# Patient Record
Sex: Male | Born: 1945
Health system: Southern US, Community
[De-identification: ages and names within clinical notes are randomized; demographics above are authoritative.]

## PROBLEM LIST (undated history)

## (undated) DIAGNOSIS — I10 Essential (primary) hypertension: Secondary | ICD-10-CM

## (undated) DIAGNOSIS — I739 Peripheral vascular disease, unspecified: Secondary | ICD-10-CM

## (undated) DIAGNOSIS — E7800 Pure hypercholesterolemia, unspecified: Secondary | ICD-10-CM

## (undated) HISTORY — DX: Peripheral vascular disease, unspecified: I73.9

## (undated) HISTORY — PX: APPENDECTOMY: SHX54

---

## 2011-05-17 ENCOUNTER — Emergency Department (INDEPENDENT_AMBULATORY_CARE_PROVIDER_SITE_OTHER)
Admission: EM | Admit: 2011-05-17 | Discharge: 2011-05-17 | Disposition: A | Discharge status: Home / Self Care | Payer: Self-pay | Source: Home / Self Care | Attending: Family Medicine | Admitting: Family Medicine

## 2011-05-17 ENCOUNTER — Emergency Department (INDEPENDENT_AMBULATORY_CARE_PROVIDER_SITE_OTHER): Payer: Self-pay

## 2011-05-17 ENCOUNTER — Encounter: Payer: Self-pay | Admitting: Emergency Medicine

## 2011-05-17 DIAGNOSIS — E1169 Type 2 diabetes mellitus with other specified complication: Secondary | ICD-10-CM

## 2011-05-17 DIAGNOSIS — I10 Essential (primary) hypertension: Secondary | ICD-10-CM

## 2011-05-17 HISTORY — DX: Pure hypercholesterolemia, unspecified: E78.00

## 2011-05-17 HISTORY — DX: Essential (primary) hypertension: I10

## 2011-05-17 LAB — POCT I-STAT, CHEM 8
Calcium, Ion: 1.35 mmol/L — ABNORMAL HIGH (ref 1.12–1.32)
Glucose, Bld: 223 mg/dL — ABNORMAL HIGH (ref 70–99)
HCT: 51 % (ref 39.0–52.0)
TCO2: 28 mmol/L (ref 0–100)

## 2011-05-17 MED ORDER — LISINOPRIL-HYDROCHLOROTHIAZIDE 10-12.5 MG PO TABS: 1.0000 | ORAL_TABLET | Freq: Every day | ORAL | Status: DC

## 2011-05-17 NOTE — ED Notes (Signed)
Patient transported to X-ray.  Patient not in treatment room for recheck of vitals

## 2011-05-17 NOTE — ED Provider Notes (Signed)
History     CSN: [id number]  Arrival date & time 05/17/11  1206   First MD Initiated Contact with Patient 05/17/11 1215      Chief Complaint  Patient presents with  . URI    (Consider location/radiation/quality/duration/timing/severity/associated sxs/prior treatment) Patient is a 65 y.o. male presenting with URI. The history is provided by the patient.  URI The primary symptoms include fatigue. Primary symptoms do not include ear pain or sore throat. The current episode started 6 to 7 days ago. This is a new problem. The problem has not changed since onset. Symptoms associated with the illness include facial pain, sinus pressure and congestion.    Past Medical History  Diagnosis Date  . Diabetes mellitus   . Hypertension   . High cholesterol     Past Surgical History  Procedure Date  . Appendectomy     Family History  Problem Relation Age of Onset  . Heart failure Mother   . Cancer Father   . Diabetes Sister   . Cancer Sister     History  Substance Use Topics  . Smoking status: Never Smoker   . Smokeless tobacco: Not on file  . Alcohol Use: No      Review of Systems  Constitutional: Positive for fatigue.  HENT: Positive for congestion and sinus pressure. Negative for ear pain, nosebleeds, sore throat, facial swelling and tinnitus.   Respiratory: Negative.     Allergies  Review of patient's allergies indicates no known allergies.  Home Medications   Current Outpatient Rx  Name Route Sig Dispense Refill  . METFORMIN HCL 1000 MG PO TABS Oral Take 1,000 mg by mouth 2 (two) times daily with a meal.      . OVER THE COUNTER MEDICATION  novolin insulin     . LISINOPRIL-HYDROCHLOROTHIAZIDE 10-12.5 MG PO TABS Oral Take 1 tablet by mouth daily. 30 tablet 1    BP 168/71  Pulse 70  Temp(Src) 97.5 F (36.4 C) (Oral)  Resp 18  SpO2 100%  Physical Exam  Constitutional: He is oriented to person, place, and time. He appears well-developed and well-nourished.   HENT:  Head: Normocephalic.  Right Ear: External ear normal.  Left Ear: External ear normal.  Mouth/Throat: Oropharynx is clear and moist.  Eyes: Conjunctivae and EOM are normal. Pupils are equal, round, and reactive to light.  Neck: Normal range of motion. Neck supple.  Cardiovascular: Normal rate, normal heart sounds and intact distal pulses.   Pulmonary/Chest: Effort normal and breath sounds normal.  Lymphadenopathy:    He has no cervical adenopathy.  Neurological: He is alert and oriented to person, place, and time.  Skin: Skin is warm and dry.    ED Course  Procedures (including critical care time)  Labs Reviewed  POCT I-STAT, CHEM 8 - Abnormal; Notable for the following:    Glucose, Bld 223 (*)    Calcium, Ion 1.35 (*)    Hemoglobin 17.3 (*)    All other components within normal limits  I-STAT, CHEM 8   Dg Sinuses Complete  05/17/2011  *RADIOLOGY REPORT*  Clinical Data: Facial pressure for 1 week, worse on the left.  PARANASAL SINUSES - COMPLETE 3 + VIEW  Comparison: None.  Findings: The paranasal sinuses appear clear.  No fracture is identified.  IMPRESSION: Negative exam.  Original Report Authenticated By: Bernadene Bell. D'ALESSIO, M.D.     1. Hypertension associated with diabetes       MDM  X-rays reviewed and report per radiologist.  i-stat reviewed       Barkley Bruns, MD 05/17/11 1328

## 2011-05-17 NOTE — ED Notes (Signed)
C/o head congestion and pressure.  Sinus congestion.  Onset one week ago.  Lack of energy

## 2011-06-03 ENCOUNTER — Other Ambulatory Visit: Payer: Self-pay | Admitting: Family Medicine

## 2011-06-03 DIAGNOSIS — R52 Pain, unspecified: Secondary | ICD-10-CM

## 2014-07-18 DIAGNOSIS — I152 Hypertension secondary to endocrine disorders: Secondary | ICD-10-CM

## 2014-07-18 DIAGNOSIS — E11319 Type 2 diabetes mellitus with unspecified diabetic retinopathy without macular edema: Secondary | ICD-10-CM | POA: Insufficient documentation

## 2014-07-18 DIAGNOSIS — I10 Essential (primary) hypertension: Secondary | ICD-10-CM

## 2014-07-18 DIAGNOSIS — E1159 Type 2 diabetes mellitus with other circulatory complications: Secondary | ICD-10-CM

## 2014-07-18 DIAGNOSIS — E782 Mixed hyperlipidemia: Secondary | ICD-10-CM | POA: Insufficient documentation

## 2015-01-26 ENCOUNTER — Encounter: Payer: Self-pay | Admitting: Endocrinology

## 2018-01-18 ENCOUNTER — Encounter (INDEPENDENT_AMBULATORY_CARE_PROVIDER_SITE_OTHER): Payer: Medicare HMO | Admitting: Ophthalmology

## 2018-01-18 DIAGNOSIS — E113592 Type 2 diabetes mellitus with proliferative diabetic retinopathy without macular edema, left eye: Secondary | ICD-10-CM

## 2018-01-18 DIAGNOSIS — E11311 Type 2 diabetes mellitus with unspecified diabetic retinopathy with macular edema: Secondary | ICD-10-CM | POA: Diagnosis not present

## 2018-01-18 DIAGNOSIS — E113511 Type 2 diabetes mellitus with proliferative diabetic retinopathy with macular edema, right eye: Secondary | ICD-10-CM

## 2018-01-18 DIAGNOSIS — H4311 Vitreous hemorrhage, right eye: Secondary | ICD-10-CM

## 2018-01-18 DIAGNOSIS — I10 Essential (primary) hypertension: Secondary | ICD-10-CM | POA: Diagnosis not present

## 2018-01-18 DIAGNOSIS — H211X3 Other vascular disorders of iris and ciliary body, bilateral: Secondary | ICD-10-CM

## 2018-01-18 DIAGNOSIS — H2513 Age-related nuclear cataract, bilateral: Secondary | ICD-10-CM

## 2018-01-18 DIAGNOSIS — H43813 Vitreous degeneration, bilateral: Secondary | ICD-10-CM

## 2018-01-18 DIAGNOSIS — H35033 Hypertensive retinopathy, bilateral: Secondary | ICD-10-CM

## 2018-01-26 ENCOUNTER — Encounter (INDEPENDENT_AMBULATORY_CARE_PROVIDER_SITE_OTHER): Payer: Medicare HMO | Admitting: Ophthalmology

## 2018-01-26 DIAGNOSIS — E11311 Type 2 diabetes mellitus with unspecified diabetic retinopathy with macular edema: Secondary | ICD-10-CM

## 2018-01-26 DIAGNOSIS — E113512 Type 2 diabetes mellitus with proliferative diabetic retinopathy with macular edema, left eye: Secondary | ICD-10-CM | POA: Diagnosis not present

## 2018-02-15 ENCOUNTER — Encounter (INDEPENDENT_AMBULATORY_CARE_PROVIDER_SITE_OTHER): Payer: Medicare HMO | Admitting: Ophthalmology

## 2018-02-15 DIAGNOSIS — H35033 Hypertensive retinopathy, bilateral: Secondary | ICD-10-CM | POA: Diagnosis not present

## 2018-02-15 DIAGNOSIS — E113513 Type 2 diabetes mellitus with proliferative diabetic retinopathy with macular edema, bilateral: Secondary | ICD-10-CM | POA: Diagnosis not present

## 2018-02-15 DIAGNOSIS — H43813 Vitreous degeneration, bilateral: Secondary | ICD-10-CM

## 2018-02-15 DIAGNOSIS — H4311 Vitreous hemorrhage, right eye: Secondary | ICD-10-CM

## 2018-02-15 DIAGNOSIS — I10 Essential (primary) hypertension: Secondary | ICD-10-CM

## 2018-02-15 DIAGNOSIS — E11311 Type 2 diabetes mellitus with unspecified diabetic retinopathy with macular edema: Secondary | ICD-10-CM | POA: Diagnosis not present

## 2018-02-15 DIAGNOSIS — H2513 Age-related nuclear cataract, bilateral: Secondary | ICD-10-CM

## 2018-03-13 ENCOUNTER — Observation Stay (HOSPITAL_COMMUNITY): Payer: Medicare HMO

## 2018-03-13 ENCOUNTER — Other Ambulatory Visit: Payer: Self-pay

## 2018-03-13 ENCOUNTER — Encounter (HOSPITAL_COMMUNITY): Payer: Self-pay | Admitting: Emergency Medicine

## 2018-03-13 ENCOUNTER — Inpatient Hospital Stay (HOSPITAL_COMMUNITY)
Admission: EM | Admit: 2018-03-13 | Discharge: 2018-03-20 | DRG: 196 | Disposition: A | Discharge status: Home / Self Care | Payer: Medicare HMO | Attending: Family Medicine | Admitting: Family Medicine

## 2018-03-13 ENCOUNTER — Emergency Department (HOSPITAL_COMMUNITY): Payer: Medicare HMO

## 2018-03-13 ENCOUNTER — Observation Stay (HOSPITAL_BASED_OUTPATIENT_CLINIC_OR_DEPARTMENT_OTHER): Payer: Medicare HMO

## 2018-03-13 DIAGNOSIS — R17 Unspecified jaundice: Secondary | ICD-10-CM

## 2018-03-13 DIAGNOSIS — Z9049 Acquired absence of other specified parts of digestive tract: Secondary | ICD-10-CM

## 2018-03-13 DIAGNOSIS — I450 Right fascicular block: Secondary | ICD-10-CM | POA: Diagnosis present

## 2018-03-13 DIAGNOSIS — E1165 Type 2 diabetes mellitus with hyperglycemia: Secondary | ICD-10-CM | POA: Diagnosis present

## 2018-03-13 DIAGNOSIS — Z79899 Other long term (current) drug therapy: Secondary | ICD-10-CM

## 2018-03-13 DIAGNOSIS — E1122 Type 2 diabetes mellitus with diabetic chronic kidney disease: Secondary | ICD-10-CM | POA: Diagnosis present

## 2018-03-13 DIAGNOSIS — J929 Pleural plaque without asbestos: Secondary | ICD-10-CM

## 2018-03-13 DIAGNOSIS — Z794 Long term (current) use of insulin: Secondary | ICD-10-CM

## 2018-03-13 DIAGNOSIS — J189 Pneumonia, unspecified organism: Secondary | ICD-10-CM | POA: Diagnosis not present

## 2018-03-13 DIAGNOSIS — R7989 Other specified abnormal findings of blood chemistry: Secondary | ICD-10-CM | POA: Diagnosis present

## 2018-03-13 DIAGNOSIS — J61 Pneumoconiosis due to asbestos and other mineral fibers: Principal | ICD-10-CM | POA: Diagnosis present

## 2018-03-13 DIAGNOSIS — I5030 Unspecified diastolic (congestive) heart failure: Secondary | ICD-10-CM

## 2018-03-13 DIAGNOSIS — G4730 Sleep apnea, unspecified: Secondary | ICD-10-CM | POA: Diagnosis present

## 2018-03-13 DIAGNOSIS — R9389 Abnormal findings on diagnostic imaging of other specified body structures: Secondary | ICD-10-CM

## 2018-03-13 DIAGNOSIS — E785 Hyperlipidemia, unspecified: Secondary | ICD-10-CM | POA: Diagnosis present

## 2018-03-13 DIAGNOSIS — E871 Hypo-osmolality and hyponatremia: Secondary | ICD-10-CM

## 2018-03-13 DIAGNOSIS — Z8249 Family history of ischemic heart disease and other diseases of the circulatory system: Secondary | ICD-10-CM

## 2018-03-13 DIAGNOSIS — I509 Heart failure, unspecified: Secondary | ICD-10-CM | POA: Diagnosis not present

## 2018-03-13 DIAGNOSIS — Z79890 Hormone replacement therapy (postmenopausal): Secondary | ICD-10-CM

## 2018-03-13 DIAGNOSIS — Z833 Family history of diabetes mellitus: Secondary | ICD-10-CM

## 2018-03-13 DIAGNOSIS — N182 Chronic kidney disease, stage 2 (mild): Secondary | ICD-10-CM | POA: Diagnosis present

## 2018-03-13 DIAGNOSIS — I10 Essential (primary) hypertension: Secondary | ICD-10-CM

## 2018-03-13 DIAGNOSIS — R220 Localized swelling, mass and lump, head: Secondary | ICD-10-CM

## 2018-03-13 DIAGNOSIS — R011 Cardiac murmur, unspecified: Secondary | ICD-10-CM | POA: Diagnosis present

## 2018-03-13 DIAGNOSIS — Z87891 Personal history of nicotine dependence: Secondary | ICD-10-CM

## 2018-03-13 DIAGNOSIS — Z6825 Body mass index (BMI) 25.0-25.9, adult: Secondary | ICD-10-CM

## 2018-03-13 DIAGNOSIS — I248 Other forms of acute ischemic heart disease: Secondary | ICD-10-CM | POA: Diagnosis present

## 2018-03-13 DIAGNOSIS — I872 Venous insufficiency (chronic) (peripheral): Secondary | ICD-10-CM | POA: Diagnosis present

## 2018-03-13 DIAGNOSIS — I129 Hypertensive chronic kidney disease with stage 1 through stage 4 chronic kidney disease, or unspecified chronic kidney disease: Secondary | ICD-10-CM | POA: Diagnosis present

## 2018-03-13 DIAGNOSIS — Z77090 Contact with and (suspected) exposure to asbestos: Secondary | ICD-10-CM

## 2018-03-13 DIAGNOSIS — R112 Nausea with vomiting, unspecified: Secondary | ICD-10-CM | POA: Diagnosis not present

## 2018-03-13 DIAGNOSIS — R400 Somnolence: Secondary | ICD-10-CM

## 2018-03-13 DIAGNOSIS — R0602 Shortness of breath: Secondary | ICD-10-CM | POA: Diagnosis not present

## 2018-03-13 DIAGNOSIS — G7000 Myasthenia gravis without (acute) exacerbation: Secondary | ICD-10-CM | POA: Diagnosis present

## 2018-03-13 DIAGNOSIS — J9622 Acute and chronic respiratory failure with hypercapnia: Secondary | ICD-10-CM | POA: Diagnosis present

## 2018-03-13 DIAGNOSIS — I452 Bifascicular block: Secondary | ICD-10-CM | POA: Diagnosis present

## 2018-03-13 DIAGNOSIS — E663 Overweight: Secondary | ICD-10-CM | POA: Diagnosis present

## 2018-03-13 DIAGNOSIS — J9611 Chronic respiratory failure with hypoxia: Secondary | ICD-10-CM

## 2018-03-13 DIAGNOSIS — J9612 Chronic respiratory failure with hypercapnia: Secondary | ICD-10-CM

## 2018-03-13 DIAGNOSIS — Z23 Encounter for immunization: Secondary | ICD-10-CM

## 2018-03-13 DIAGNOSIS — Z7982 Long term (current) use of aspirin: Secondary | ICD-10-CM

## 2018-03-13 DIAGNOSIS — I2781 Cor pulmonale (chronic): Secondary | ICD-10-CM | POA: Diagnosis present

## 2018-03-13 DIAGNOSIS — L989 Disorder of the skin and subcutaneous tissue, unspecified: Secondary | ICD-10-CM | POA: Diagnosis present

## 2018-03-13 DIAGNOSIS — I2510 Atherosclerotic heart disease of native coronary artery without angina pectoris: Secondary | ICD-10-CM | POA: Diagnosis present

## 2018-03-13 DIAGNOSIS — I152 Hypertension secondary to endocrine disorders: Secondary | ICD-10-CM

## 2018-03-13 DIAGNOSIS — E1159 Type 2 diabetes mellitus with other circulatory complications: Secondary | ICD-10-CM

## 2018-03-13 DIAGNOSIS — E119 Type 2 diabetes mellitus without complications: Secondary | ICD-10-CM

## 2018-03-13 DIAGNOSIS — I2729 Other secondary pulmonary hypertension: Secondary | ICD-10-CM | POA: Diagnosis present

## 2018-03-13 DIAGNOSIS — I4519 Other right bundle-branch block: Secondary | ICD-10-CM | POA: Diagnosis present

## 2018-03-13 DIAGNOSIS — J9621 Acute and chronic respiratory failure with hypoxia: Secondary | ICD-10-CM | POA: Diagnosis present

## 2018-03-13 LAB — CBC WITH DIFFERENTIAL/PLATELET
ABS IMMATURE GRANULOCYTES: 0.03 10*3/uL (ref 0.00–0.07)
Basophils Absolute: 0 10*3/uL (ref 0.0–0.1)
Basophils Relative: 0 %
EOS PCT: 2 %
Eosinophils Absolute: 0.2 10*3/uL (ref 0.0–0.5)
HCT: 51.3 % (ref 39.0–52.0)
HEMOGLOBIN: 16.9 g/dL (ref 13.0–17.0)
Immature Granulocytes: 0 %
LYMPHS ABS: 1.5 10*3/uL (ref 0.7–4.0)
LYMPHS PCT: 14 %
MCH: 29.6 pg (ref 26.0–34.0)
MCHC: 32.9 g/dL (ref 30.0–36.0)
MCV: 89.8 fL (ref 80.0–100.0)
Monocytes Absolute: 0.8 10*3/uL (ref 0.1–1.0)
Monocytes Relative: 8 %
NEUTROS ABS: 7.9 10*3/uL — AB (ref 1.7–7.7)
Neutrophils Relative %: 76 %
Platelets: 200 10*3/uL (ref 150–400)
RBC: 5.71 MIL/uL (ref 4.22–5.81)
RDW: 12.7 % (ref 11.5–15.5)
WBC: 10.3 10*3/uL (ref 4.0–10.5)
nRBC: 0 % (ref 0.0–0.2)

## 2018-03-13 LAB — COMPREHENSIVE METABOLIC PANEL
ALT: 11 U/L (ref 0–44)
ANION GAP: 9 (ref 5–15)
AST: 26 U/L (ref 15–41)
Albumin: 3.3 g/dL — ABNORMAL LOW (ref 3.5–5.0)
Alkaline Phosphatase: 86 U/L (ref 38–126)
BILIRUBIN TOTAL: 1.7 mg/dL — AB (ref 0.3–1.2)
BUN: 11 mg/dL (ref 8–23)
CO2: 32 mmol/L (ref 22–32)
Calcium: 10.2 mg/dL (ref 8.9–10.3)
Chloride: 91 mmol/L — ABNORMAL LOW (ref 98–111)
Creatinine, Ser: 0.89 mg/dL (ref 0.61–1.24)
GFR calc non Af Amer: >60 mL/min (ref >60)
Glucose, Bld: 118 mg/dL — ABNORMAL HIGH (ref 70–99)
POTASSIUM: 4.2 mmol/L (ref 3.5–5.1)
Sodium: 132 mmol/L — ABNORMAL LOW (ref 135–145)
TOTAL PROTEIN: 6 g/dL — AB (ref 6.5–8.1)

## 2018-03-13 LAB — GLUCOSE, CAPILLARY
GLUCOSE-CAPILLARY: 161 mg/dL — AB (ref 70–99)
GLUCOSE-CAPILLARY: 89 mg/dL (ref 70–99)
Glucose-Capillary: 101 mg/dL — ABNORMAL HIGH (ref 70–99)
Glucose-Capillary: 157 mg/dL — ABNORMAL HIGH (ref 70–99)
Glucose-Capillary: 266 mg/dL — ABNORMAL HIGH (ref 70–99)

## 2018-03-13 LAB — BLOOD GAS, ARTERIAL
ACID-BASE EXCESS: 14.6 mmol/L — AB (ref 0.0–2.0)
Acid-Base Excess: 14.4 mmol/L — ABNORMAL HIGH (ref 0.0–2.0)
BICARBONATE: 41.5 mmol/L — AB (ref 20.0–28.0)
BICARBONATE: 40.1 mmol/L — AB (ref 20.0–28.0)
DRAWN BY: 10552
Delivery systems: POSITIVE
Drawn by: 10552
EXPIRATORY PAP: 6
FIO2: 35
Inspiratory PAP: 12
O2 Content: 2 L/min
O2 Saturation: 95.6 %
O2 Saturation: 97.2 %
PATIENT TEMPERATURE: 98.6
PATIENT TEMPERATURE: 98.6
pCO2 arterial: 87.6 mmHg (ref 32.0–48.0)
pCO2 arterial: 67.1 mmHg (ref 32.0–48.0)
pH, Arterial: 7.297 — ABNORMAL LOW (ref 7.350–7.450)
pH, Arterial: 7.394 (ref 7.350–7.450)
pO2, Arterial: 82.5 mmHg — ABNORMAL LOW (ref 83.0–108.0)
pO2, Arterial: 85.9 mmHg (ref 83.0–108.0)

## 2018-03-13 LAB — TROPONIN I
Troponin I: 0.03 ng/mL (ref <0.03)
Troponin I: <0.03 ng/mL (ref <0.03)
Troponin I: 0.03 ng/mL (ref <0.03)

## 2018-03-13 LAB — I-STAT TROPONIN, ED: Troponin i, poc: 0.01 ng/mL (ref 0.00–0.08)

## 2018-03-13 LAB — HEMOGLOBIN A1C
HEMOGLOBIN A1C: 10.2 % — AB (ref 4.8–5.6)
MEAN PLASMA GLUCOSE: 246.04 mg/dL

## 2018-03-13 LAB — ECHOCARDIOGRAM COMPLETE
HEIGHTINCHES: 74 in
Weight: 3283.2 oz

## 2018-03-13 LAB — TSH: TSH: 3.792 u[IU]/mL (ref 0.350–4.500)

## 2018-03-13 LAB — BRAIN NATRIURETIC PEPTIDE: B NATRIURETIC PEPTIDE 5: 40.8 pg/mL (ref 0.0–100.0)

## 2018-03-13 LAB — I-STAT CG4 LACTIC ACID, ED: Lactic Acid, Venous: 0.65 mmol/L (ref 0.5–1.9)

## 2018-03-13 MED ORDER — PREDNISOLONE ACETATE 1 % OP SUSP
1.0000 [drp] | Freq: Four times a day (QID) | OPHTHALMIC | Status: DC
  Administered 2018-03-13 – 2018-03-20 (×28): 1 [drp] via OPHTHALMIC
  Filled 2018-03-13: qty 5

## 2018-03-13 MED ORDER — FUROSEMIDE 10 MG/ML IJ SOLN
40.0000 mg | Freq: Once | INTRAMUSCULAR | Status: AC
  Administered 2018-03-13: 40 mg via INTRAVENOUS
  Filled 2018-03-13: qty 4

## 2018-03-13 MED ORDER — INSULIN GLARGINE 100 UNIT/ML ~~LOC~~ SOLN
20.0000 [IU] | Freq: Every day | SUBCUTANEOUS | Status: DC
  Administered 2018-03-13 – 2018-03-19 (×7): 20 [IU] via SUBCUTANEOUS
  Filled 2018-03-13 (×8): qty 0.2

## 2018-03-13 MED ORDER — METOPROLOL TARTRATE 100 MG PO TABS
100.0000 mg | ORAL_TABLET | Freq: Two times a day (BID) | ORAL | Status: DC
  Administered 2018-03-13 – 2018-03-18 (×10): 100 mg via ORAL
  Filled 2018-03-13 (×10): qty 1

## 2018-03-13 MED ORDER — ATORVASTATIN CALCIUM 40 MG PO TABS
40.0000 mg | ORAL_TABLET | Freq: Every day | ORAL | Status: DC
  Administered 2018-03-13 – 2018-03-19 (×7): 40 mg via ORAL
  Filled 2018-03-13 (×7): qty 1

## 2018-03-13 MED ORDER — ENSURE ENLIVE PO LIQD: 237.0000 mL | Freq: Two times a day (BID) | ORAL | Status: DC

## 2018-03-13 MED ORDER — HYDROCHLOROTHIAZIDE 25 MG PO TABS
25.0000 mg | ORAL_TABLET | Freq: Every day | ORAL | Status: DC
  Administered 2018-03-14 – 2018-03-18 (×5): 25 mg via ORAL
  Filled 2018-03-13 (×5): qty 1

## 2018-03-13 MED ORDER — NITROGLYCERIN 2 % TD OINT
1.0000 [in_us] | TOPICAL_OINTMENT | Freq: Once | TRANSDERMAL | Status: AC
  Administered 2018-03-13: 1 [in_us] via TOPICAL
  Filled 2018-03-13: qty 1

## 2018-03-13 MED ORDER — INFLUENZA VAC SPLIT HIGH-DOSE 0.5 ML IM SUSY
0.5000 mL | PREFILLED_SYRINGE | INTRAMUSCULAR | Status: AC
  Administered 2018-03-14: 0.5 mL via INTRAMUSCULAR
  Filled 2018-03-13: qty 0.5

## 2018-03-13 MED ORDER — INSULIN ASPART 100 UNIT/ML ~~LOC~~ SOLN
0.0000 [IU] | Freq: Every day | SUBCUTANEOUS | Status: DC
  Administered 2018-03-13: 3 [IU] via SUBCUTANEOUS
  Administered 2018-03-14: 2 [IU] via SUBCUTANEOUS
  Administered 2018-03-15: 3 [IU] via SUBCUTANEOUS
  Administered 2018-03-16: 2 [IU] via SUBCUTANEOUS
  Administered 2018-03-17: 3 [IU] via SUBCUTANEOUS

## 2018-03-13 MED ORDER — SODIUM CHLORIDE 0.9 % IV SOLN
500.0000 mg | Freq: Once | INTRAVENOUS | Status: AC
  Administered 2018-03-13: 500 mg via INTRAVENOUS
  Filled 2018-03-13: qty 500

## 2018-03-13 MED ORDER — LISINOPRIL 10 MG PO TABS
20.0000 mg | ORAL_TABLET | Freq: Every day | ORAL | Status: DC
  Administered 2018-03-14 – 2018-03-18 (×5): 20 mg via ORAL
  Filled 2018-03-13 (×5): qty 2

## 2018-03-13 MED ORDER — GLUCERNA SHAKE PO LIQD
237.0000 mL | Freq: Two times a day (BID) | ORAL | Status: DC
  Administered 2018-03-14 – 2018-03-20 (×8): 237 mL via ORAL

## 2018-03-13 MED ORDER — ASPIRIN 81 MG PO CHEW: 324.0000 mg | CHEWABLE_TABLET | Freq: Once | ORAL | Status: DC

## 2018-03-13 MED ORDER — LISINOPRIL-HYDROCHLOROTHIAZIDE 20-25 MG PO TABS: 1.0000 | ORAL_TABLET | Freq: Every day | ORAL | Status: DC

## 2018-03-13 MED ORDER — LEVOTHYROXINE SODIUM 25 MCG PO TABS
25.0000 ug | ORAL_TABLET | Freq: Every day | ORAL | Status: DC
  Administered 2018-03-13 – 2018-03-20 (×8): 25 ug via ORAL
  Filled 2018-03-13 (×8): qty 1

## 2018-03-13 MED ORDER — ORAL CARE MOUTH RINSE
15.0000 mL | Freq: Two times a day (BID) | OROMUCOSAL | Status: DC
  Administered 2018-03-13 – 2018-03-20 (×13): 15 mL via OROMUCOSAL

## 2018-03-13 MED ORDER — INSULIN ASPART 100 UNIT/ML ~~LOC~~ SOLN
0.0000 [IU] | Freq: Three times a day (TID) | SUBCUTANEOUS | Status: DC
  Administered 2018-03-13 – 2018-03-14 (×2): 3 [IU] via SUBCUTANEOUS
  Administered 2018-03-14: 5 [IU] via SUBCUTANEOUS
  Administered 2018-03-15: 2 [IU] via SUBCUTANEOUS
  Administered 2018-03-15: 11 [IU] via SUBCUTANEOUS
  Administered 2018-03-15 – 2018-03-16 (×4): 5 [IU] via SUBCUTANEOUS
  Administered 2018-03-17: 3 [IU] via SUBCUTANEOUS
  Administered 2018-03-18: 5 [IU] via SUBCUTANEOUS
  Administered 2018-03-18: 8 [IU] via SUBCUTANEOUS
  Administered 2018-03-18: 3 [IU] via SUBCUTANEOUS
  Administered 2018-03-19: 15 [IU] via SUBCUTANEOUS
  Administered 2018-03-19 – 2018-03-20 (×2): 2 [IU] via SUBCUTANEOUS
  Administered 2018-03-20: 11 [IU] via SUBCUTANEOUS

## 2018-03-13 MED ORDER — FUROSEMIDE 10 MG/ML IJ SOLN
40.0000 mg | Freq: Two times a day (BID) | INTRAMUSCULAR | Status: DC
  Administered 2018-03-13 (×2): 40 mg via INTRAVENOUS
  Filled 2018-03-13 (×3): qty 4

## 2018-03-13 MED ORDER — METOPROLOL SUCCINATE ER 100 MG PO TB24: 100.0000 mg | ORAL_TABLET | Freq: Every day | ORAL | Status: DC

## 2018-03-13 MED ORDER — CEFTRIAXONE SODIUM 2 G IJ SOLR
2.0000 g | Freq: Once | INTRAMUSCULAR | Status: AC
  Administered 2018-03-13: 2 g via INTRAVENOUS
  Filled 2018-03-13: qty 20

## 2018-03-13 NOTE — Progress Notes (Addendum)
Page to family medicine.   3e20 Robert Ramirez. please enter code status order. lab reports critical troponin of 0.03.  CRITICAL VALUE ALERT  Critical Value:  Troponin 0.03  Date & Time Notied:  Today [phone]  Provider Notified: page to family medicine  Orders Received/Actions taken: will trend troponin

## 2018-03-13 NOTE — Progress Notes (Addendum)
Patient found unresponsive at  0918  Pt seen alert and oriented at 0915, per nursing student and instructor.  Seen at baseline at 0850 by RN.   Page to RR-RN and Family Medicine.   Patient finally wakes to 5 sternal rub, but remains lethargic and confused.  Neuro determines not a code stroke. Family medicine at bedside places ABG order.

## 2018-03-13 NOTE — Progress Notes (Signed)
Patient arrived on the unit from 3E on a hospital bed accompanied by the rapid response nurse and another RN, placed on tele ccmd notified, patient assesment completed, patient on BIPAP, no sign of distress noted, patient oriented to room and staff, bed in lowest position, call bell within reach will monitor.

## 2018-03-13 NOTE — Progress Notes (Signed)
  Echocardiogram 2D Echocardiogram has been performed.  Robert Ramirez 03/13/2018, 2:18 PM

## 2018-03-13 NOTE — Progress Notes (Signed)
Transported to ct and back on BIPAP

## 2018-03-13 NOTE — Progress Notes (Signed)
Call placed to patient's wife. Notified of pt transfer to [facility].

## 2018-03-13 NOTE — Progress Notes (Signed)
Pt placed on Bipap at this time per MD order.  Pt tolerating well.  Pending transfer to 4W.  Will stay at bedside and wait to transfer.

## 2018-03-13 NOTE — ED Provider Notes (Signed)
[facility] EMERGENCY DEPARTMENT Provider Note   CSN: [id number] Arrival date & time: 03/13/18  0037     History   Chief Complaint Chief Complaint  Patient presents with  . Shortness of Breath    HPI Robert Ramirez is a 72 y.o. male.  The history is provided by the patient.  Shortness of Breath   He has history of diabetes, hypertension, hyperlipidemia and comes in because of shortness of breath over the last several days.  He is rather poor and evasive historian, but has noted worsening dyspnea over the last 3 days.  Symptoms are worse with exertion and with laying flat.  He denies chest pain, heaviness, tightness, pressure.  He does relate that he has had some intermittent swelling of his legs over the past year, generally getting better when he keeps his legs elevated.  He has had difficulty with dyspnea in the past, but he relates that to some occupational exposure years ago.  He has not done anything to treat his symptoms at home.  Past Medical History:  Diagnosis Date  . Diabetes mellitus   . High cholesterol   . Hypertension     There are no active problems to display for this patient.   Past Surgical History:  Procedure Laterality Date  . APPENDECTOMY          Home Medications    Prior to Admission medications   Medication Sig Start Date End Date Taking? Authorizing Provider  lisinopril-hydrochlorothiazide (PRINZIDE) 10-12.5 MG per tablet Take 1 tablet by mouth daily. 05/17/11 05/16/12  Linna Hoff, MD  metFORMIN (GLUCOPHAGE) 1000 MG tablet Take 1,000 mg by mouth 2 (two) times daily with a meal.      [provider]  OVER THE COUNTER MEDICATION novolin insulin     [provider]    Family History Family History  Problem Relation Age of Onset  . Heart failure Mother   . Cancer Father   . Diabetes Sister   . Cancer Sister     Social History Social History   Tobacco Use  . Smoking status: Never Smoker    Substance Use Topics  . Alcohol use: No  . Drug use: No     Allergies   Patient has no known allergies.   Review of Systems Review of Systems  Respiratory: Positive for shortness of breath.   All other systems reviewed and are negative.    Physical Exam Updated Vital Signs BP (!) 183/93 (BP Location: Left Arm)   Pulse 93   Temp 98.1 F (36.7 C) (Oral)   Resp (!) 24   Ht 6\' 2"  (1.88 m)   Wt 90.7 kg   SpO2 97%   BMI 25.68 kg/m   Physical Exam  Nursing note and vitals reviewed.  72 year old male, mildly dyspneic at rest, but in no acute distress. Vital signs are significant for elevated blood pressure. Oxygen saturation is 97%, which is normal.  He is not using accessory muscles of respiration, but is unable to complete a sentence without stopping to take a breath. Head is normocephalic and atraumatic. PERRLA, EOMI. Oropharynx is clear. Neck is nontender and supple without adenopathy or JVD. Back is nontender and there is no CVA tenderness.  1+ presacral edema present. Lungs have bibasilar rales about one third of the way up.  There are no wheezes or rhonchi. Chest is nontender. Heart has regular rate and rhythm without murmur. Abdomen is soft, flat, nontender without masses  or hepatosplenomegaly and peristalsis is normoactive. Extremities have 2-3+ pretibial edema, full range of motion is present. Skin is warm and dry without rash. Neurologic: Mental status is normal, cranial nerves are intact, there are no motor or sensory deficits.  ED Treatments / Results  Labs (all labs ordered are listed, but only abnormal results are displayed) Labs Reviewed  COMPREHENSIVE METABOLIC PANEL - Abnormal; Notable for the following components:      Result Value   Sodium 132 (*)    Chloride 91 (*)    Glucose, Bld 118 (*)    Total Protein 6.0 (*)    Albumin 3.3 (*)    Total Bilirubin 1.7 (*)    All other components within normal limits  CBC WITH DIFFERENTIAL/PLATELET -  Abnormal; Notable for the following components:   Neutro Abs 7.9 (*)    All other components within normal limits  CULTURE, BLOOD (ROUTINE X 2)  CULTURE, BLOOD (ROUTINE X 2)  BRAIN NATRIURETIC PEPTIDE  I-STAT TROPONIN, ED  I-STAT CG4 LACTIC ACID, ED    EKG EKG Interpretation  Date/Time:  Saturday March 13 2018 00:45:36 EDT Ventricular Rate:  88 PR Interval:    QRS Duration: 148 QT Interval:  387 QTC Calculation: 469 R Axis:   139 Text Interpretation:  Sinus rhythm Prolonged PR interval RBBB and LPFB No old tracing to compare Confirmed by Dione Booze ([id number]) on 03/13/2018 12:50:21 AM   Radiology Dg Chest 2 View  Result Date: 03/13/2018 CLINICAL DATA:  72 y/o  M; shortness of breath. EXAM: CHEST - 2 VIEW COMPARISON:  None. FINDINGS: Mildly enlarged cardiac silhouette given projection and technique. Calcified pleural plaques. Bilateral pleural thickening versus small pleural effusions. Patchy central and basilar airspace opacities. Bones are unremarkable. IMPRESSION: 1. Patchy central and basilar airspace opacities, possibly chronic lung disease or pneumonia. 2. Calcified pleural plaques. Bilateral pleural thickening versus small pleural effusions. 3. Mildly enlarged cardiac silhouette. Electronically Signed   By: Mitzi Hansen M.D.   On: 03/13/2018 01:51    Procedures Procedures   Medications Ordered in ED Medications  aspirin chewable tablet 324 mg (324 mg Oral Not Given 03/13/18 0052)  cefTRIAXone (ROCEPHIN) 2 g in sodium chloride 0.9 % 100 mL IVPB (2 g Intravenous New Bag/Given 03/13/18 0302)  azithromycin (ZITHROMAX) 500 mg in sodium chloride 0.9 % 250 mL IVPB (has no administration in time range)  furosemide (LASIX) injection 40 mg (40 mg Intravenous Given 03/13/18 0104)  nitroGLYCERIN (NITROGLYN) 2 % ointment 1 inch (1 inch Topical Given 03/13/18 0104)     Initial Impression / Assessment and Plan / ED Course  I have reviewed the triage vital signs and the  nursing notes.  Pertinent labs & imaging results that were available during my care of the patient were reviewed by me and considered in my medical decision making (see chart for details).  Dyspnea with orthopnea and peripheral edema strongly suggestive of heart failure.  Patient does not have history of heart failure.  Screening labs and chest x-ray are ordered and he will be given topical nitroglycerin to assist in blood pressure control and will be given a dose of furosemide.  Old records are reviewed, and he has no relevant past visits.  Chest x-ray shows cardiomegaly and possible pneumonia.  I reviewed the images and feel that this is all heart failure.  However, given radiologist concern for possible pneumonia, he is started on antibiotics.  Labs do show normal BNP, but you can have a false negative BNP in  the setting of significant right heart failure with peripheral edema.  Labs also show mild hyponatremia which is not clinically significant, and mild elevation of bilirubin of uncertain significance.  He did have good diuresis with furosemide.  Blood pressure has come down with combination of furosemide and topical nitroglycerin.  Case is discussed with Dr. Abelardo Diesel of family practice service who agrees to admit the patient.  Final Clinical Impressions(s) / ED Diagnoses   Final diagnoses:  Acute heart failure, unspecified heart failure type (HCC)  Community acquired pneumonia, unspecified laterality  Hyponatremia  Serum total bilirubin elevated    ED Discharge Orders    None       Dione Booze, MD 03/13/18 [phone]

## 2018-03-13 NOTE — Progress Notes (Signed)
Initial Nutrition Assessment  DOCUMENTATION CODES:  Not applicable  INTERVENTION:  Glucerna Shake po BID, each supplement provides 220 kcal and 10 grams of protein  Once pt off Bipap Support and If workup confirms dx of new HF, please re consult RD for education  NUTRITION DIAGNOSIS:  Inadequate oral intake related to poor appetite as evidenced by per patient/family report.  GOAL:  Patient will meet greater than or equal to 90% of their needs  MONITOR:  PO intake, Supplement acceptance, Labs, Weight trends, I & O's  REASON FOR ASSESSMENT:  Malnutrition Screening Tool    ASSESSMENT:  72 y/o male PMHx DM2, HTN, HLD. Presented to ED w/ several days of SOB and LE swelling. Does not have hx of HF or CKD. Clinical picture most consistent w/ new HF. Admitted for further evaluation.    Patient was extremely difficult to arouse this morning and was found to have significantly elevated pco2. Transferred to stepdown for bipap support.   Pt still on Bipap when RD arrived, making communication more difficult, but he is well alert now.   Pt reports he has had worsening intake over the past few days because "I havent felt good". He says that at baseline he eats 2x a day (was eating 3x a day when working). When RD asked if he avoided salt, he admits '"no, I love salt". He does not sound to follow DM diet either. He reports his BGs can be over 200-a1c was 10.2  Despite his report of having increased swelling, he says his current weight of 205.2 lbs is "light for me". He says his UBW is closer to 220-230 lbs. There is no recent weight history in chart. In 2016, was weighed outpatient at 212.4-219.2 lbs  At this time, he says he really isnt that hungry. He also is unable to eat right now d/t bipap support. He was agreeable to supplements.   Patient has not had echo to confirm dx of new HF. If indeed his dx, will need education. Based on A1C, would likely benefit from DM education as well.   Labs:  a1c today: 10.2, Na: 132, Albumin:3.3, Bgs: 101-161 Meds: Ensure BID, Lasix,    Recent Labs  Lab 03/13/18 0057  NA 132*  K 4.2  CL 91*  CO2 32  BUN 11  CREATININE 0.89  CALCIUM 10.2  GLUCOSE 118*   NUTRITION - FOCUSED PHYSICAL EXAM:   Most Recent Value  Orbital Region  No depletion  Upper Arm Region  No depletion  Thoracic and Lumbar Region  No depletion  Buccal Region  No depletion  Temple Region  No depletion  Clavicle Bone Region  No depletion  Clavicle and Acromion Bone Region  No depletion  Scapular Bone Region  No depletion  Dorsal Hand  No depletion  Patellar Region  No depletion  Anterior Thigh Region  No depletion  Posterior Calf Region  No depletion  Edema (RD Assessment)  Mild  Hair  Reviewed  Eyes  Reviewed  Mouth  Reviewed  Skin  Reviewed  Nails  Reviewed       Diet Order:   Diet Order            Diet Carb Modified Fluid consistency: Thin; Room service appropriate? Yes  Diet effective now             EDUCATION NEEDS:  Not appropriate for education at this time  Skin: Right Arm  Last BM:  10/18  Height:  Ht Readings from Last  1 Encounters:  03/13/18 6\' 2"  (1.88 m)   Weight:  Wt Readings from Last 1 Encounters:  03/13/18 93.1 kg   Ideal Body Weight:  86.36 kg  BMI:  Body mass index is 26.35 kg/m.  Estimated Nutritional Needs:  Kcal:  2050-2250 kcals (22-24 kcal/kg bw) Protein:  104-121g (1.2-1.4g/kg ibw) Fluid:  >2.1-2.3 L fluid (65ml/kcal)  Christophe Louis RD, LDN, CNSC Clinical Nutrition Available Tues-Sat via Pager: [phone] 03/13/2018 1:58 PM

## 2018-03-13 NOTE — Progress Notes (Signed)
FPTS Social Progress Note  S:Patient sleeping on exam. Extremely hard to arouse. Took several minutes of shaking and sternal rubs to finally awaken patient. Upon arousal, patient notes he is feeling well. No complaints of pain or trouble breathing. Patient hemodynamically stable on 2L [place] and afebrile ON with elevated BP's in the 150's/60's. At end of exam, nurse stopped me in hall to inform me that wife had informed her that he has lost 20-30 pounds over the last year, unintentionally. His level of functionality has declined considerably over the last year as well. This has become very worrisome to her.  O: Very sleep on exam. Radial pulses 2+ bilaterally. Pedal pulses difficult to palpate due to edema. 2-3+ pitting edema bilaterally extending to knee. Abdomen nontender to palpation.  BP (!) 158/63   Pulse 87   Temp 97.7 F (36.5 C) (Oral)   Resp 18   Ht 6\' 2"  (1.88 m)   Wt 93.1 kg Comment: scale c  SpO2 94%   BMI 26.35 kg/m     A/P: Will continue work-up as planned in H&P Due to patients excessive tiredness and wife not currently in room, will wait to further evaluate weight loss once I can obtain a better history.  Joana Reamer, DO 03/13/2018, 7:45 AM PGY-1, [facility] Family Medicine Service pager [phone]

## 2018-03-13 NOTE — Progress Notes (Signed)
CRITICAL VALUE ALERT  Critical Value:   ABG pH:7.29 pCO2:87 pO2:82 Bicarb:41  Date & Time Notied:  03/13/18 10:28 AM  Provider Notified: Family Medicine  Orders Received/Actions taken:  Transfer to stepdown for bipap

## 2018-03-13 NOTE — ED Triage Notes (Signed)
Pt BIB [facility] from home, c/o shortness of breath x "months", worsening in the last three days. Pt has swelling to BLE and abdomen, denies hx CHF. Denies chest pain at this time. SpO2 87% room air, improved to 94% on 2L [place].

## 2018-03-13 NOTE — Progress Notes (Signed)
Call placed to give report to [facility] (room 21)

## 2018-03-13 NOTE — H&P (Addendum)
Family Medicine Teaching [facility] Admission History and Physical Service Pager: [phone]  Patient name: Robert Ramirez Medical record number: [id number] Date of birth: [birth year] Age: 72 y.o. Gender: male  Primary Care Provider: Patient, No Pcp Per Consultants: None Code Status: Full Code  Chief Complaint: Shortness of breath  Assessment and Plan: [name] is a 72 y.o. male presenting with several days of SOB. PMH is significant for T2DM, HTN, and HLD.  Worsening Dypnea without hypoxia  Possible new onset heart failure:  Endorses worsening SOB and LE edema without known kidney or heart failure. No previous echo. BNP WNL, Cr 0.89. Presented with mild hypoxia of 87% improved with 2L [place], otherwise hemodynamically stable and afebrile without chest pain or cough. BP's were hypertensive. CXR significant for patchy central and basilar infiltrates consistent with either chronic lung disease or pneumonia. Patient started on IV Ceftriaxone and Azithromycin. Doubt pneumonia based on history, labs, and clinical picture. Although hypoxic on presentation, unlikely PE as patient has not been immobilized, denies pleuritic chest pain, HR WNL, and vitals stabilized with 2L O2. Well's Score 0. Although LE edema, it has been gradually worsening and is bilateral. Clinical picture appears to be most consistent with SOB secondary to pleural effusion from worsening heart function. Will obtain Echo to evaluate the heart. Will continue to trend troponins and get a repeat EKG. Will consider cardiology consult pending results. Will consider discontinuing antibiotics pending labs and imaging. - Admit to observation, Telemetry, attending Dr. Jennette Kettle - Continue IV 40mg  lasix BID - Trend trops x 3 q6 hrs - Echo in AM, repeat EKG in AM - Continue Ceftriaxone and Azithromycin - AM A1C, BMP, TSH - Continue home ASA 81 mg - Initiating atorvastatin 40mg  QD - Continuous pulse ox with O2 PRN, incentive spirometry  -  Carb modified diet - Flu vaccine - Daily weights, strict I&O's  Hyponatremia:  Sodium 132.  Most likely dilutional from acute fluid overload. Will correct if <120.  - Continue to monitor - Correct if <120  HTN: chronic, stable Elevated BP on admission 183/93. Likely to improve as diurese.  - Continue home Lisinopril-HCTZ, and metoprolol  - Continue to monitor   HLD: chronic Lipid panel 01/19/2015: LDL 119, Triglycerides 191. On home Lovastatin. - Initiating Atorvastatin 40mg  QD  T2DM: chronic, uncontrolled Last A1C: 9.1 (8.26.16). On Novolin 80U (supposed to take 40U BID, but admits to taking 80U QD) and Metformin. - Obtain Hgb A1c - Hold home insulin and metformin - Begin Lantus 20U qHS - SSI  - Continue to monitor CBG's  FEN/GI: Carb modified diet, saline lock, replace electrolytes PRN Prophylaxis: SCD's  Disposition: Observation to Telemetry and await cardiology work-up for possible new onset heart failure  History of Present Illness:  Robert Ramirez is a 72 y.o. male presenting with worsening SOB and LE swelling. Patient notes he has become increasingly short of breath over the last two weeks. His wife notes that he has been sleeping in a recliner or rocking chair because "he cant breath when he lays down". He has become more short of breath with exertion as well. He has had LE swelling over the past year that improves with elevation but seems to have gotten worse and doesn't appear to go down as much anymore. Patient notes he has no history of heart or kidney disease.   Patient denies chest pain, fever, chills, nausea, vomiting, cough, abdominal pain, or diarrhea. Wife notes he's has some constipation over the last several days that is not  normal for him. 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 He admits to some increased urinary frequency and trouble urinating without dysuria,  hematuria or urgency. His wife admits to some "prostate" problems.   In the ED, patient presented with O2 sats at 87% on RA improved to 94% with 2L [place]. Otherwise he was hemodynamically stable and afebrile. Labs were significant for Na 132, Cr 0.89, total protein 6.0, albumin 3.3, elevated total bilirubin of 1.7, liver enzymes WNL, CBC WNL with WBC of 10.3, BNP 40.8, troponin neg x 1. Blood culture and lactic acid pending. CXR significant for patchy central and basilar airspace opacities possibly chronic lung disease vs pneumonia, and calcified pleural plaques with b/l pleural thickening vs small pleural effusions. Due to x-ray, Ceftriaxone and Azithromycin were initiated in addition to 40mg  IV lasix. Patient was admitted for further work up and diuresis.   Review Of Systems: Per HPI with the following additions:  Otherwise the remainder of the systems were negative.  Review of Systems  Constitutional: Negative for chills and fever.  HENT: Negative for congestion and sinus pain.   Eyes: Negative for pain and discharge.  Respiratory: Positive for cough and shortness of breath. Negative for wheezing.   Cardiovascular: Positive for orthopnea and leg swelling. Negative for chest pain.  Gastrointestinal: Negative for abdominal pain, blood in stool, diarrhea, nausea and vomiting.  Genitourinary: Positive for frequency. Negative for dysuria, hematuria and urgency.  Musculoskeletal: Negative for falls and myalgias.  Skin: Negative for itching and rash.  Neurological: Positive for dizziness. Negative for weakness and headaches.  Psychiatric/Behavioral: Negative for substance abuse. The patient is not nervous/anxious.    Patient Active Problem List   Diagnosis Date Noted  . CHF exacerbation (HCC) 03/13/2018    Past Medical History: Past Medical History:  Diagnosis Date  . Diabetes mellitus   . High cholesterol   . Hypertension     Past Surgical History: Past Surgical History:  Procedure  Laterality Date  . APPENDECTOMY      Social History: Social History   Tobacco Use  . Smoking status: Former Games developer  . Smokeless tobacco: Never Used  Substance Use Topics  . Alcohol use: No  . Drug use: No   Additional social history: Lives in [place] with wife. Chews tobacco. Former smoker. Denies EtOH and illicit drug use. Please also refer to relevant sections of EMR.  Family History: Family History  Problem Relation Age of Onset  . Heart failure Mother   . Cancer Father   . Diabetes Sister   . Cancer Sister     Allergies and Medications: No Known Allergies No current facility-administered medications on file prior to encounter.    Current Outpatient Medications on File Prior to Encounter  Medication Sig Dispense Refill  . insulin NPH Human (HUMULIN N,NOVOLIN N) 100 UNIT/ML injection Inject 80 Units into the skin daily.    Marland Kitchen levothyroxine (SYNTHROID, LEVOTHROID) 25 MCG tablet Take 25 mcg by mouth daily.    Marland Kitchen lisinopril-hydrochlorothiazide (PRINZIDE,ZESTORETIC) 20-25 MG tablet Take 1 tablet by mouth daily.    Marland Kitchen lovastatin (MEVACOR) 20 MG tablet Take 20 mg by mouth every evening.    . metFORMIN (GLUCOPHAGE) 1000 MG tablet Take 1,000 mg by mouth 2 (two) times daily with a meal.      . metoprolol tartrate (LOPRESSOR) 100 MG tablet Take 50 mg by mouth daily.    Marland Kitchen  prednisoLONE acetate (PRED FORTE) 1 % ophthalmic suspension Place 1 drop into both eyes 4 (four) times daily.  1  . lisinopril-hydrochlorothiazide (PRINZIDE) 10-12.5 MG per tablet Take 1 tablet by mouth daily. (Patient not taking: Reported on 03/13/2018) 30 tablet 1    Objective: BP (!) 158/63   Pulse 87   Temp 97.7 F (36.5 C) (Oral)   Resp 18   Ht 6\' 2"  (1.88 m)   Wt 93.1 kg Comment: scale c  SpO2 94%   BMI 26.35 kg/m  Physical Exam:  Gen: NAD, alert, non-toxic, well-appearing, sitting comfortably  Skin: Warm and dry. No obvious rashes, lesions, or trauma. Stasis dermatitis noted on LE bilaterally without  open wounds. HEENT: NCAT No conjunctival pallor or injection. No scleral icterus or injection.  MMM.  CV: RRR.   <2s capillary refill bilaterally.  RP & DPs 2+ bilaterally. 2+ pitting edema extending to knee bilaterally. Resp: Crackles heard in lower lobes bilaterally. No increased WOB Abd: NTND on palpation to all 4 quadrants.  Positive bowel sounds. Psych: Cooperative with exam. Pleasant. Makes eye contact. Speech normal. Extremities: Moves all extremities spontaneously    Labs and Imaging: CBC BMET  Recent Labs  Lab 03/13/18 0057  WBC 10.3  HGB 16.9  HCT 51.3  PLT 200   Recent Labs  Lab 03/13/18 0057  NA 132*  K 4.2  CL 91*  CO2 32  BUN 11  CREATININE 0.89  GLUCOSE 118*  CALCIUM 10.2     Dg Chest 2 View  Result Date: 03/13/2018 CLINICAL DATA:  72 y/o  M; shortness of breath. EXAM: CHEST - 2 VIEW COMPARISON:  None. FINDINGS: Mildly enlarged cardiac silhouette given projection and technique. Calcified pleural plaques. Bilateral pleural thickening versus small pleural effusions. Patchy central and basilar airspace opacities. Bones are unremarkable. IMPRESSION: 1. Patchy central and basilar airspace opacities, possibly chronic lung disease or pneumonia. 2. Calcified pleural plaques. Bilateral pleural thickening versus small pleural effusions. 3. Mildly enlarged cardiac silhouette. Electronically Signed   By: Mitzi Hansen M.D.   On: 03/13/2018 01:51    Joana Reamer, DO 03/13/2018, 6:20 AM PGY-1, [facility] FPTS Intern pager: [phone], text pages welcome  Upper Level Addendum: I have seen and evaluated this patient along with Dr. Mauri Reading and reviewed the above note, making necessary revisions in blue.  Durward Parcel, DO [facility] Family Medicine, PGY-3

## 2018-03-13 NOTE — Progress Notes (Addendum)
FPTS Interim Progress Note  S:Paged by nursing as patient found unresponsive and awoke to sternal rub.  Code stroke had been called.  Patient seen and examined at bedside.  Patient awake and alert when examined.  Denied pain.  Denied having a history of being difficult to arouse.  Neurology also present at bedside.  O: BP 134/68 (BP Location: Right Arm)   Pulse 82   Temp 98.1 F (36.7 C) (Oral)   Resp 20   Ht 6\' 2"  (1.88 m)   Wt 93.1 kg Comment: scale c  SpO2 96%   BMI 26.35 kg/m   Physical Exam: General: 72 y.o. male in NAD, lying with head of bed elevated HEENT: PERRL, EOMI Cardio: RRR no m/r/g Lungs: bibasilar crackles Skin: warm and dry Extremities: Moves all extremities equally, sensation intact throughout, 5/5 strength BUE/BLE Neuro: A&Ox2, did not know year, CN II-XII intact  CBG 161  A/P: Neurology stated that given difficulty arousing, only massive brainstem stroke would be on differential and that given patient's current state of being awake with non-focal neuro exam, that is not likely.  Neuro recommended no further workup from their standpoint and cancelled code stroke. Vitals all WNL during event and hypoglycemia ruled out.  EKG this AM without arrhythmia or acute ischemic changes. No sedating medications noted in [facility]. Will obtain ABG as patient may be retaining CO2. Will continue to monitor.  Update 1031 Arterial Blood Gas result:  pO2 82.5; pCO2 87.6; pH 7.297;  HCO3 41.5, %O2 Sat 95.6. Per nurse, patient still awake and alert. - Transfer patient to stepdown - BiPAP per RT prn - continuous pulse ox - continue to monitor   Meccariello, Solmon Ice, DO 03/13/2018, 9:52 AM PGY-1, [facility] Family Medicine Service pager [phone]

## 2018-03-13 NOTE — Progress Notes (Signed)
Patient sleeping during shift report.      

## 2018-03-13 NOTE — Progress Notes (Signed)
RT NOTE:  Pt currently not on BIPAP. Vitals are stable and patient A&O. Pt understands to call out to RN if he has difficulty breathing. RT will monitor.

## 2018-03-13 NOTE — Significant Event (Signed)
Rapid Response Event Note  Overview:  Called to bedside as code stroke page Time Called: 0921 Arrival Time: 0923 Event Type: Neurologic  Initial Focused Assessment:  On arrival patient with eyes open - answering some questions - sleepy and mild confusion - moves all extremities to command - follows 2 step commands - no focal neuro deficits noted - Resps reg and unlabored -skin warm and dry - BP 128/78 HR 83 RR 22 O2 sats 98%  CBG 162.  RN Morrie Sheldon at bedside - reports patient was awake 15 mins prior to call - then it took 5 deep sternal rubs to awaken patient.  MD note from this am also reports patient difficult to arouse and sleepy when aroused.  Dr. Obie Dredge at bedside along with Dr. Otelia Limes from code stroke team.     Interventions:   Code stroke cancelled per Dr. Otelia Limes.  Primary team to get further labs to include ABG to rule our hypercarbia.    Plan of Care (if not transferred):  Continue frequent monitoring - further orders depending on ABG results per MD.    Follow Up:  ABG shows hypercarbia - back to bedside - Bipap initiated at 1120 per RT Lauren 12/6 35% Fio2 - patient tol well - remains arousable with mild confusion but co-op able to cough - lungs clear. Report called to 4E RN per Paulino Rily.  146/65 HR 96 RR 15 with some support from Bipap - O2 sats 98%.  Transported to 4E with Bipap.  More awake - some resisitance to Bipap at this point.  Hanodoff to [employer].  MD's updated on transfer.     Event Summary:  Name of Consulting Physician Notified: Dr. Otelia Limes  per code stroke page at [phone]  Outcome: Stayed in room and stabalized  Event End Time: 0945  Follow up - ended at 1200.    Robert Ramirez

## 2018-03-14 DIAGNOSIS — I10 Essential (primary) hypertension: Secondary | ICD-10-CM | POA: Diagnosis not present

## 2018-03-14 DIAGNOSIS — N182 Chronic kidney disease, stage 2 (mild): Secondary | ICD-10-CM | POA: Diagnosis present

## 2018-03-14 DIAGNOSIS — E11319 Type 2 diabetes mellitus with unspecified diabetic retinopathy without macular edema: Secondary | ICD-10-CM | POA: Diagnosis not present

## 2018-03-14 DIAGNOSIS — Z77090 Contact with and (suspected) exposure to asbestos: Secondary | ICD-10-CM | POA: Diagnosis not present

## 2018-03-14 DIAGNOSIS — R011 Cardiac murmur, unspecified: Secondary | ICD-10-CM | POA: Diagnosis present

## 2018-03-14 DIAGNOSIS — R9389 Abnormal findings on diagnostic imaging of other specified body structures: Secondary | ICD-10-CM

## 2018-03-14 DIAGNOSIS — R7989 Other specified abnormal findings of blood chemistry: Secondary | ICD-10-CM | POA: Diagnosis present

## 2018-03-14 DIAGNOSIS — E1169 Type 2 diabetes mellitus with other specified complication: Secondary | ICD-10-CM

## 2018-03-14 DIAGNOSIS — I2781 Cor pulmonale (chronic): Secondary | ICD-10-CM | POA: Diagnosis present

## 2018-03-14 DIAGNOSIS — I4519 Other right bundle-branch block: Secondary | ICD-10-CM | POA: Diagnosis present

## 2018-03-14 DIAGNOSIS — E785 Hyperlipidemia, unspecified: Secondary | ICD-10-CM | POA: Diagnosis present

## 2018-03-14 DIAGNOSIS — E1165 Type 2 diabetes mellitus with hyperglycemia: Secondary | ICD-10-CM | POA: Diagnosis present

## 2018-03-14 DIAGNOSIS — I2729 Other secondary pulmonary hypertension: Secondary | ICD-10-CM | POA: Diagnosis present

## 2018-03-14 DIAGNOSIS — I872 Venous insufficiency (chronic) (peripheral): Secondary | ICD-10-CM | POA: Diagnosis present

## 2018-03-14 DIAGNOSIS — J929 Pleural plaque without asbestos: Secondary | ICD-10-CM | POA: Diagnosis not present

## 2018-03-14 DIAGNOSIS — J189 Pneumonia, unspecified organism: Secondary | ICD-10-CM | POA: Diagnosis not present

## 2018-03-14 DIAGNOSIS — G4730 Sleep apnea, unspecified: Secondary | ICD-10-CM | POA: Diagnosis present

## 2018-03-14 DIAGNOSIS — I129 Hypertensive chronic kidney disease with stage 1 through stage 4 chronic kidney disease, or unspecified chronic kidney disease: Secondary | ICD-10-CM | POA: Diagnosis present

## 2018-03-14 DIAGNOSIS — J9602 Acute respiratory failure with hypercapnia: Secondary | ICD-10-CM | POA: Diagnosis not present

## 2018-03-14 DIAGNOSIS — I452 Bifascicular block: Secondary | ICD-10-CM | POA: Diagnosis present

## 2018-03-14 DIAGNOSIS — E119 Type 2 diabetes mellitus without complications: Secondary | ICD-10-CM | POA: Diagnosis not present

## 2018-03-14 DIAGNOSIS — R112 Nausea with vomiting, unspecified: Secondary | ICD-10-CM | POA: Diagnosis not present

## 2018-03-14 DIAGNOSIS — E1122 Type 2 diabetes mellitus with diabetic chronic kidney disease: Secondary | ICD-10-CM | POA: Diagnosis present

## 2018-03-14 DIAGNOSIS — I450 Right fascicular block: Secondary | ICD-10-CM | POA: Diagnosis present

## 2018-03-14 DIAGNOSIS — J9612 Chronic respiratory failure with hypercapnia: Secondary | ICD-10-CM

## 2018-03-14 DIAGNOSIS — J9622 Acute and chronic respiratory failure with hypercapnia: Secondary | ICD-10-CM | POA: Diagnosis present

## 2018-03-14 DIAGNOSIS — R220 Localized swelling, mass and lump, head: Secondary | ICD-10-CM | POA: Diagnosis not present

## 2018-03-14 DIAGNOSIS — I509 Heart failure, unspecified: Secondary | ICD-10-CM | POA: Diagnosis not present

## 2018-03-14 DIAGNOSIS — I248 Other forms of acute ischemic heart disease: Secondary | ICD-10-CM | POA: Diagnosis present

## 2018-03-14 DIAGNOSIS — E871 Hypo-osmolality and hyponatremia: Secondary | ICD-10-CM | POA: Diagnosis present

## 2018-03-14 DIAGNOSIS — J9611 Chronic respiratory failure with hypoxia: Secondary | ICD-10-CM

## 2018-03-14 DIAGNOSIS — J9621 Acute and chronic respiratory failure with hypoxia: Secondary | ICD-10-CM | POA: Diagnosis present

## 2018-03-14 DIAGNOSIS — R400 Somnolence: Secondary | ICD-10-CM | POA: Diagnosis not present

## 2018-03-14 DIAGNOSIS — Z23 Encounter for immunization: Secondary | ICD-10-CM | POA: Diagnosis present

## 2018-03-14 DIAGNOSIS — Z794 Long term (current) use of insulin: Secondary | ICD-10-CM | POA: Diagnosis not present

## 2018-03-14 DIAGNOSIS — R17 Unspecified jaundice: Secondary | ICD-10-CM | POA: Diagnosis not present

## 2018-03-14 DIAGNOSIS — L989 Disorder of the skin and subcutaneous tissue, unspecified: Secondary | ICD-10-CM | POA: Diagnosis present

## 2018-03-14 DIAGNOSIS — I2510 Atherosclerotic heart disease of native coronary artery without angina pectoris: Secondary | ICD-10-CM | POA: Diagnosis present

## 2018-03-14 DIAGNOSIS — G7000 Myasthenia gravis without (acute) exacerbation: Secondary | ICD-10-CM | POA: Diagnosis present

## 2018-03-14 DIAGNOSIS — I50813 Acute on chronic right heart failure: Secondary | ICD-10-CM

## 2018-03-14 DIAGNOSIS — J9601 Acute respiratory failure with hypoxia: Secondary | ICD-10-CM | POA: Diagnosis not present

## 2018-03-14 DIAGNOSIS — R0602 Shortness of breath: Secondary | ICD-10-CM | POA: Diagnosis present

## 2018-03-14 DIAGNOSIS — J61 Pneumoconiosis due to asbestos and other mineral fibers: Secondary | ICD-10-CM | POA: Diagnosis present

## 2018-03-14 LAB — BLOOD GAS, ARTERIAL
Acid-Base Excess: 19.1 mmol/L — ABNORMAL HIGH (ref 0.0–2.0)
Acid-Base Excess: 18 mmol/L — ABNORMAL HIGH (ref 0.0–2.0)
Acid-Base Excess: 17.5 mmol/L — ABNORMAL HIGH (ref 0.0–2.0)
Acid-Base Excess: 17.9 mmol/L — ABNORMAL HIGH (ref 0.0–2.0)
BICARBONATE: 43.2 mmol/L — AB (ref 20.0–28.0)
Bicarbonate: 45.7 mmol/L — ABNORMAL HIGH (ref 20.0–28.0)
Bicarbonate: 44.7 mmol/L — ABNORMAL HIGH (ref 20.0–28.0)
Bicarbonate: 43.5 mmol/L — ABNORMAL HIGH (ref 20.0–28.0)
Delivery systems: POSITIVE
Drawn by: 105521
Drawn by: 105521
Drawn by: 10552
Drawn by: 414221
EXPIRATORY PAP: 6
FIO2: 32
FIO2: 0.3
Inspiratory PAP: 14
O2 CONTENT: 2 L/min
O2 Content: 3 L/min
O2 SAT: 98.3 %
O2 SAT: 96.3 %
O2 Saturation: 95.5 %
O2 Saturation: 97 %
PATIENT TEMPERATURE: 98.6
PCO2 ART: 66 mmHg — AB (ref 32.0–48.0)
PH ART: 7.418 (ref 7.350–7.450)
PH ART: 7.433 (ref 7.350–7.450)
Patient temperature: 98.6
Patient temperature: 98.6
Patient temperature: 98.3
pCO2 arterial: 86 mmHg (ref 32.0–48.0)
pCO2 arterial: 85.6 mmHg (ref 32.0–48.0)
pCO2 arterial: 68.2 mmHg (ref 32.0–48.0)
pH, Arterial: 7.345 — ABNORMAL LOW (ref 7.350–7.450)
pH, Arterial: 7.338 — ABNORMAL LOW (ref 7.350–7.450)
pO2, Arterial: 77.9 mmHg — ABNORMAL LOW (ref 83.0–108.0)
pO2, Arterial: 90.8 mmHg (ref 83.0–108.0)
pO2, Arterial: 98.4 mmHg (ref 83.0–108.0)
pO2, Arterial: 74.2 mmHg — ABNORMAL LOW (ref 83.0–108.0)

## 2018-03-14 LAB — BRAIN NATRIURETIC PEPTIDE: B Natriuretic Peptide: 34.2 pg/mL (ref 0.0–100.0)

## 2018-03-14 LAB — BASIC METABOLIC PANEL
ANION GAP: 7 (ref 5–15)
BUN: 13 mg/dL (ref 8–23)
CALCIUM: 10.5 mg/dL — AB (ref 8.9–10.3)
CO2: 44 mmol/L — AB (ref 22–32)
Chloride: 89 mmol/L — ABNORMAL LOW (ref 98–111)
Creatinine, Ser: 1.15 mg/dL (ref 0.61–1.24)
Glucose, Bld: 124 mg/dL — ABNORMAL HIGH (ref 70–99)
Potassium: 3.6 mmol/L (ref 3.5–5.1)
Sodium: 140 mmol/L (ref 135–145)

## 2018-03-14 LAB — GLUCOSE, CAPILLARY
GLUCOSE-CAPILLARY: 152 mg/dL — AB (ref 70–99)
GLUCOSE-CAPILLARY: 247 mg/dL — AB (ref 70–99)
Glucose-Capillary: 99 mg/dL (ref 70–99)
Glucose-Capillary: 217 mg/dL — ABNORMAL HIGH (ref 70–99)

## 2018-03-14 MED ORDER — ENOXAPARIN SODIUM 40 MG/0.4ML ~~LOC~~ SOLN
40.0000 mg | SUBCUTANEOUS | Status: DC
  Administered 2018-03-14 – 2018-03-16 (×3): 40 mg via SUBCUTANEOUS
  Filled 2018-03-14 (×3): qty 0.4

## 2018-03-14 MED ORDER — IPRATROPIUM-ALBUTEROL 0.5-2.5 (3) MG/3ML IN SOLN: 3.0000 mL | RESPIRATORY_TRACT | Status: DC | PRN

## 2018-03-14 NOTE — Consult Note (Addendum)
PULMONARY / CRITICAL CARE MEDICINE   NAME:  Robert Ramirez, MRN:  [id number], DOB:  [birth year], LOS: 0 ADMISSION DATE:  03/13/2018, CONSULTATION DATE: 03/14/2018 REFERRING MD: Dr. 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  HISTORY OF PRESENT ILLNESS   Patient presented with worsening shortness of breath, lower extremity edema, not acting his usual according to family member Symptoms of orthopnea Leg edema has progressed over the last few months Denies any fevers or chills Has not been coughing much more than his usual Quit smoking over 35 years ago  SIGNIFICANT PAST MEDICAL HISTORY   Progressive shortness of breath led to recent presentation History of diabetes History of hypertension No diagnosis of lung disease in the past  SIGNIFICANT EVENTS:  Hypercapnic respiratory failure, STUDIES:   ABG revealing acute hypercapnia, likely a component of chronic CO2 retention at baseline CULTURES:  Blood cultures on 03/13/2018-  ANTIBIOTICS:  Azithromycin on 03/13/2018 Rocephin on 03/13/2018  LINES/TUBES:   Peripheral access CONSULTANTS:   SUBJECTIVE:  Feels better than when he came into the hospital, not back to baseline yet Denies any headaches Denies any chest pain or chest discomfort Shortness of breath feels a little bit better  CONSTITUTIONAL: BP 117/65 (BP Location: Right Arm)   Pulse 76   Temp 98 F (36.7 C) (Axillary) Comment (Src): on Bipap  Resp (!) 21   Ht 6\' 2"  (1.88 m)   Wt 89.5 kg   SpO2 99%   BMI 25.34 kg/m   I/O last 3 completed shifts: In: 1787.5 [P.O.:750; IV Piggyback:1037.5] Out: 3420 [Urine:3420]  PHYSICAL EXAM: General: Elderly gentleman, does not appear to be in respiratory distress Neuro: Awake and alert, moving all  extremities HEENT: Moist oral mucosa, neck is supple with no JVD Cardiovascular: S1-S2 appreciated Lungs: Poor air entry bilaterally Abdomen: Bowel sounds appreciated Musculoskeletal: No deformity Skin: Skin is warm and dry  RESOLVED PROBLEM LIST   ASSESSMENT AND PLAN   Hypercapnic respiratory failure This is likely secondary to acute on chronic respiratory failure Current pH is consistent with an underlying chronic CO2 retention  Chronic respiratory failure -This may be secondary to restrictive lung disease and obstructive lung disease with his history of smoking in the past  Pleural plaques -Contribute to his restrictive physiology, worked with insulation in the past -Progressive disease  Abnormal CT scan of the chest Significant for extensive pleural plaquing Calcified adenopathy suggesting an old granulomatous disease Areas of scarring CT follow-up in 3 months is appropriate to follow areas of scarring The presence of mass cannot be reliably excluded however-there is extensive pleural plaquing that may explain current findings  Diabetes Hypertension  Likely presence of pulmonary hypertension even though the pressures could not be adequately estimated on the current echocardiogram, presence of atrial dilatation and reduced right systolic pressures May also have a component of right heart failure contributing to current presentation  May have underlying obstructive lung disease Start on bronchodilators as needed  Counseled patient about the need to use BiPAP  Pulmonary function study when more stable-likely as an outpatient  SUMMARY OF TODAY'S PLAN:  We will follow-up on repeat ABG BiPAP as tolerated  Best Practice / Goals of Care / Disposition.   DVT PROPHYLAXIS: On Lovenox NUTRITION: As tolerated MOBILITY: As tolerated FAMILY DISCUSSIONS: Discussed with spouse  at bedside  LABS  Glucose Recent Labs  Lab 03/13/18 0921 03/13/18 1211 03/13/18 1640  03/13/18 2139 03/14/18 0612 03/14/18 1232  GLUCAP 161* 157* 89 266* 99 152*    BMET Recent Labs  Lab 03/13/18 0057 03/14/18 0403  NA 132* 140  K 4.2 3.6  CL 91* 89*  CO2 32 44*  BUN 11 13  CREATININE 0.89 1.15  GLUCOSE 118* 124*    Liver Enzymes Recent Labs  Lab 03/13/18 0057  AST 26  ALT 11  ALKPHOS 86  BILITOT 1.7*  ALBUMIN 3.3*    Electrolytes Recent Labs  Lab 03/13/18 0057 03/14/18 0403  CALCIUM 10.2 10.5*    CBC Recent Labs  Lab 03/13/18 0057  WBC 10.3  HGB 16.9  HCT 51.3  PLT 200    ABG Recent Labs  Lab 03/13/18 1015 03/13/18 1355 03/14/18 0900  PHART 7.297* 7.394 7.345*  PCO2ART 87.6* 67.1* 86.0*  PO2ART 82.5* 85.9 77.9*    Coag's No results for input(s): APTT, INR in the last 168 hours.  Sepsis Markers Recent Labs  Lab 03/13/18 0454  LATICACIDVEN 0.65    Cardiac Enzymes Recent Labs  Lab 03/13/18 0618 03/13/18 1110 03/13/18 1702  TROPONINI 0.03* <0.03 0.03*    PAST MEDICAL HISTORY :   He  has a past medical history of Diabetes mellitus, High cholesterol, and Hypertension.  PAST SURGICAL HISTORY:  He  has a past surgical history that includes Appendectomy.  No Known Allergies  No current facility-administered medications on file prior to encounter.    Current Outpatient Medications on File Prior to Encounter  Medication Sig  . insulin NPH Human (HUMULIN N,NOVOLIN N) 100 UNIT/ML injection Inject 80 Units into the skin daily.  Marland Kitchen levothyroxine (SYNTHROID, LEVOTHROID) 25 MCG tablet Take 25 mcg by mouth daily.  Marland Kitchen lisinopril-hydrochlorothiazide (PRINZIDE,ZESTORETIC) 20-25 MG tablet Take 1 tablet by mouth daily.  Marland Kitchen lovastatin (MEVACOR) 20 MG tablet Take 20 mg by mouth every evening.  . metFORMIN (GLUCOPHAGE) 1000 MG tablet Take 1,000 mg by mouth 2 (two) times daily with a meal.    . metoprolol tartrate (LOPRESSOR) 100 MG tablet Take 50 mg by mouth daily.  . prednisoLONE acetate (PRED FORTE) 1 % ophthalmic suspension  Place 1 drop into both eyes 4 (four) times daily.  Marland Kitchen lisinopril-hydrochlorothiazide (PRINZIDE) 10-12.5 MG per tablet Take 1 tablet by mouth daily. (Patient not taking: Reported on 03/13/2018)    FAMILY HISTORY:   His family history includes Cancer in his father and sister; Diabetes in his sister; Heart failure in his mother.  SOCIAL HISTORY:  He  reports that he has quit smoking. He has never used smokeless tobacco. He reports that he does not drink alcohol or use drugs.  REVIEW OF SYSTEMS:    Review of Systems  Constitutional: Negative for chills and fever.  HENT: Negative.   Eyes: Negative.   Respiratory: Positive for shortness of breath.   Cardiovascular: Positive for orthopnea and leg swelling.  Gastrointestinal: Negative.   Genitourinary: Negative.   Musculoskeletal: Negative.   Skin: Negative.   Endo/Heme/Allergies: Negative.

## 2018-03-14 NOTE — Progress Notes (Signed)
FPTS Interim Progress Note  S:Went to examine patient with Dr. Linwood Dibbles. Patient AAOx3 (improvement from this am when he was oriented x2). Patient seems to be improving with bipap. Will plan to obtain another ABG to determine CO2 level. Will likely need continued bipap overnight. Satting at 95% on 2.5L O2 via [place].   Oralia Manis, DO 03/14/2018, 8:40 PM PGY-2, [facility] Family Medicine Service pager [phone]

## 2018-03-14 NOTE — Progress Notes (Signed)
Robert Medicus, DO at bedside at 0945, to evaluate patient with regard to abnormal arterial blood gas results (pCO2) 86.0.  Pt agrees to try BipPap.  Order for Bipap placed by provider.  Will call Becky, RRT to set up Bipap.

## 2018-03-14 NOTE — Progress Notes (Signed)
CRITICAL VALUE ALERT  Critical Value:  Arterial Blood Gas: pH=7.338, pCO 85.6, Bicarb 44.7  Date & Time Notied:  03/14/18 1445  Provider Notified: Dr. Sydnee Cabal, MD (resident for Dr. Jennette Kettle, MD)  Orders Received/Actions taken: spoke with Dr. Sydnee Cabal, MD.  Per Dr Sydnee Cabal: Pt to be on Bipap for additional 2-3 hrs, repeat ABG at 1800 and based on 1800 ABG results plan next steps.

## 2018-03-14 NOTE — Progress Notes (Signed)
CRITICAL VALUE ALERT  Critical Value:  Arterial Blood gas pCO2 86.0 Date & Time Notied:  0915 03/14/18 by Kriste Basque, RRT  Provider Notified: [place] Shirley, DO 03/14/18 0920  Orders Received/Actions taken: Provider made aware.

## 2018-03-14 NOTE — Progress Notes (Addendum)
Family Medicine Teaching Service Daily Progress Note Intern Pager: [phone]  Patient name: Robert Ramirez Medical record number: [id number] Date of birth: Jan 22, 1946 Age: 72 y.o. Gender: male  Primary Care Provider: Patient, No Pcp Per Consultants: Pulmonology  Code Status: Full  Pt Overview and Major Events to Date:  Admitted to FPTS, then to [facility] for BiPAP 10/19  Assessment and Plan: Robert Ramirez is a 72 y.o. male presenting with several days of SOB. PMH is significant for T2DM, HTN, and HLD.  Dyspnea with hypoxia likely 2/2 Asbestos:  Patient required BiPAP on 10/19 due to hypercarbia and AMS. Now on 2L [place]. A&O x2 with unclear baseline, but will obtain stat ABG to ensure patient does not need to be on BiPAP again. Likely 2/2 asbestos seen on CT (patient worked with insulation for years). Patient denies any smoking in the past. Initially thought to be new onset HF vs pna. Patient received IV Ceftriaxone and Azithromycin in ED for presumed pna, but given clinical picture, antibiotics discontinued.  - Consulted pulmonology, appreciate recommendations - Discontinue IV 40mg  lasix BID - Discontinue Ceftriaxone and Azithromycin - Continue home ASA 81 mg - Continuous pulse ox with O2 PRN, incentive spirometry  - Daily weights, strict I&O's - Will need PFT's as outpatient  Hyponatremia: resolved  Sodium 140.  Most likely dilutional from acute fluid overload. Will correct if <120.  - Continue to monitor - Correct if <120  HTN: chronic, improving Elevated BP on admission, 143/67 this am.   - Continue home Lisinopril-HCTZ, and metoprolol  - Continue to monitor   HLD: chronic Lipid panel 01/19/2015: LDL 119, Triglycerides 191. On home Lovastatin. - Continue Atorvastatin 40mg  QD  T2DM: chronic, uncontrolled A1C 10.2. On Novolin 80U (supposed to take 40U BID, but admits to taking 80U QD) and Metformin. - Hold home insulin and metformin - Continue Lantus 20U qHS - mSSI  -  Continue to monitor CBG's  FEN/GI: Carb modified diet, saline lock, replace electrolytes PRN Prophylaxis: Lovenox  Disposition: continued inpatient stay  Subjective:  Easily awoken this am. No complaints of SOB. Patient reports he did not like the BiPAP and it made him feel worse from being uncomfortable. Says that overall he feels about the same. No family present at bedside.   Patient O2 sats pre continuous monitoring recorded as 84% on 2L, increased O2 to 4L while in room and notified RN. RN states that monitor is incorrect and if you measure with portable device, it states he is 94%. Patient will need proper measuring device on continuous monitoring.  Objective: Temp:  [97.6 F (36.4 C)-98.8 F (37.1 C)] 98.8 F (37.1 C) (10/20 0438) Pulse Rate:  [78-96] 91 (10/19 1830) Resp:  [12-23] 23 (10/19 1830) BP: (91-146)/(61-85) 143/67 (10/20 0438) SpO2:  [91 %-100 %] 94 % (10/20 0400) FiO2 (%):  [35 %] 35 % (10/19 1130) Weight:  [89.5 kg] 89.5 kg (10/20 0438) Physical Exam: General: NAD, pleasant, easily awoken Cardiovascular: RRR, no m/r/g, no LE edema Respiratory: CTA BL, normal work of breathing on 2L [place] Gastrointestinal: soft, nontender, nondistended, normoactive BS Extremities: SCD's in place Derm: no rashes appreciated Neuro: CN II-XII grossly intact Psych: AO to self and place, not time, appropriate affect  Laboratory: Recent Labs  Lab 03/13/18 0057  WBC 10.3  HGB 16.9  HCT 51.3  PLT 200   Recent Labs  Lab 03/13/18 0057 03/14/18 0403  NA 132* 140  K 4.2 3.6  CL 91* 89*  CO2 32 44*  BUN 11  13  CREATININE 0.89 1.15  CALCIUM 10.2 10.5*  PROT 6.0*  --   BILITOT 1.7*  --   ALKPHOS 86  --   ALT 11  --   AST 26  --   GLUCOSE 118* 124*   TSH 3.792 A1c 10.2 Trop 0.03>0.03>0.03 BNP 40.8  Imaging/Diagnostic Tests: ECHO 03/13/18 Study Conclusions - Left ventricle: The cavity size was normal. Wall thickness was   normal. Systolic function was normal. The  estimated ejection   fraction was in the range of 60% to 65%. Wall motion was normal; there were no regional wall motion abnormalities. Doppler   parameters are consistent with abnormal left ventricular   relaxation (grade 1 diastolic dysfunction). - Ventricular septum: D-shaped interventricular septum suggestive   of RV pressure/volume overload. - Aortic valve: There was no stenosis. - Aorta: Mildly dilated aortic root. Aortic root dimension: 41 mm   (ED). - Mitral valve: Mildly calcified annulus. There was no significant   regurgitation. - Right ventricle: The cavity size was mildly dilated. Systolic   function was mildly reduced. - Right atrium: The atrium was mildly dilated. - Pulmonary arteries: No complete TR doppler jet so unable to   estimate PA systolic pressure. - Pericardium, extracardiac: A trivial pericardial effusion was   identified.  Impressions: - Normal LV size with EF 60-65%. D-shaped interventricular septum suggestive of RV pressure/volume overload. Mildly dilated RV with mildly decreased systolic function. No significant valvular   abnormalities. Consider PE in differential.   Dg Chest 2 View Result Date: 03/13/2018 CLINICAL DATA:  72 y/o  M; shortness of breath. EXAM: CHEST - 2 VIEW COMPARISON:  None. FINDINGS: Mildly enlarged cardiac silhouette given projection and technique. Calcified pleural plaques. Bilateral pleural thickening versus small pleural effusions. Patchy central and basilar airspace opacities. Bones are unremarkable. IMPRESSION: 1. Patchy central and basilar airspace opacities, possibly chronic lung disease or pneumonia. 2. Calcified pleural plaques. Bilateral pleural thickening versus small pleural effusions. 3. Mildly enlarged cardiac silhouette. Electronically Signed   By: Mitzi Hansen M.D.   On: 03/13/2018 01:51   Ct Chest Wo Contrast Result Date: 03/13/2018 CLINICAL DATA:  Shortness of breath. EXAM: CT CHEST WITHOUT CONTRAST  TECHNIQUE: Multidetector CT imaging of the chest was performed following the standard protocol without IV contrast. COMPARISON:  Radiographs of same day. FINDINGS: Cardiovascular: Atherosclerosis of thoracic aorta is noted without aneurysm formation. Normal cardiac size. Mild coronary artery calcifications are noted. No pericardial effusion is noted. Mediastinum/Nodes: Thyroid gland and esophagus are unremarkable. Calcified lymph nodes are noted in the mediastinum most consistent with prior granulomatous disease. Lungs/Pleura: No pneumothorax is noted. Calcified pleural plaques are noted bilaterally consistent with asbestos exposure. Mild bibasilar subsegmental atelectasis or scarring is noted. Significant pleural thickening is noted in medial portion of right lower lobe which may represent scarring, but possible mass cannot be excluded. Upper Abdomen: No acute abnormality. Musculoskeletal: No chest wall mass or suspicious bone lesions identified. IMPRESSION: Calcified pleural plaques are noted bilaterally consistent with asbestos exposure. Significant pleural thickening is noted along the medial portion of right lower lobe which most likely represent scarring, but possible neoplasm cannot be excluded. Follow-up CT scan of the chest in 3 months is recommended to ensure stability. Calcified mediastinal lymph nodes are noted most consistent with prior granulomatous disease. Mild bibasilar subsegmental atelectasis or scarring is noted. Aortic aneurysm NOS (ICD10-I71.9). Electronically Signed   By: Lupita Raider, M.D.   On: 03/13/2018 15:37    Lateria Alderman, [place], DO 03/14/2018, 7:32 AM  PGY-2, [place] Intern pager: [phone], text pages welcome

## 2018-03-14 NOTE — Progress Notes (Addendum)
FPTS Interim Progress Note  S: present at bedside to assess patient's mental and respiratory status s/p ABG result of CO2 86, pH 7.345 Patient seems to be mentating at baseline oriented to self and place but does have some inappropriate laughing and reactions throughout encounter.  Patient was opposed to Bipap use for the obstacle to communicating his needs. After discussing options and risks/benefits with patient and with nurse assistance, we were able to convince patient to try it again. Asked patient to repeat back to me his understanding of the plan and he seemed to have good capacity to understand the consequences and benefits of using the BiPAP at this time. He is amenable.   O: BP 140/71 (BP Location: Right Arm)   Pulse 71   Temp 98 F (36.7 C) (Oral)   Resp 19   Ht 6\' 2"  (1.88 m)   Wt 89.5 kg   SpO2 96%   BMI 25.34 kg/m     A/P:  Hypercapnia. O2 sats >90% on 3L[place]. Patient has critical CO2 level of 86 requiring restarting of BiPAP and further inpatient admission. A&ox2. Cooperative. - restart BiPAP per RT settings  - for ~2hours am and pm with breaks in between if patient cannot tolerate ok. - continue to monitor mental status - continue to monitor respiratory status - repeat ABG - if patient requires oxygen- keep flow rate 1L or less  Leeroy Bock, DO 03/14/2018, 10:08 AM PGY-1, [facility] Family Medicine Service pager [phone]

## 2018-03-14 NOTE — Progress Notes (Signed)
CRITICAL VALUE ALERT  Critical Value:  ABG: pH 7.418, CO2 68.2, bicarb 43.2  Date & Time Notied: 03/14/18 1748  Provider Notified: Dr. Sydnee Cabal, MD at 1750  Orders Received/Actions taken: Provider said to take off Bipap and put patient on 1-2 L Oxygen [place], will round to night and assess patient.

## 2018-03-14 NOTE — Progress Notes (Addendum)
Patient removed from Bipap and placed on nasal cannula at 1816 per Dr. Sydnee Cabal, MD after reviewing last ABG results.  Initially, pt was placed on 1L [place] with SpO2 as low as 85%.  Increased to 2L [place] with an SpO2 of 90-91 %.  Notified Dr. Sydnee Cabal, who advised to increase to [place] 2.5 L, resulting SpO2 was 94-95 %.

## 2018-03-14 NOTE — Progress Notes (Signed)
Family Medicine Teaching Service Daily Progress Note Intern Pager: [phone]  Patient name: Robert Ramirez Medical record number: [id number] Date of birth: March 13, 1946 Age: 72 y.o. Gender: male  Primary Care Provider: Patient, No Pcp Per Consultants: Pulmonology Code Status: Full code  Pt Overview and Major Events to Date:  03/13/2018 Admitted 03/13/2018 Stepdown for BiPAP Hospital Day: 2   Assessment and Plan: Robert Robertsonis a 72 y.o.malepresenting with several days of SOB. PMH is significant forT2DM, HTN, and HLD.  Hypercapnic Respiratory Failure likely 2/2 Asbestos:  Initially thought to be new onset HF vs pneumonia. Echo EF 60-65% with dilated RV.  IV lasix discontinued.  Patient received IV ceftriaxone and azithromycin for presumed PNA but given clinical picture antibiotics discontinued. Hypoxia likely 2/2 to history of occupational asbestos exposure and chronic CO2 retention. CT chest significant for calcified pleural plaques and significant pleural thickening most likely representing scarring. Overnight pt did well on BiPAP and [place] with 96-99% O2 sats. Pt maintains 93-96% on 2.5L [place]. Repeat ABG significant for pH 7.418>7.433, CO2 at 68.2>66, and bicarb 43.2>43.5 on 2.5L [place] indicating partially compensated hypercapnic RF. Will consult cardiology concerning echo and possible new right-sided HF. Patient will likely need Bi-pap as an outpatient. Will consult care management for further assistance. - Consulted pulmonology, appreciate recommendations - Consult cardiology - Care management consulted  -Continue homeASA 81 mg - Continuous pulse ox with O2 PRN, incentive spirometry   - Bipap as needed - Daily weights, strict I&O's - Will need PFT's as outpatient - Recommend Follow-up CT chest in 3 mo - Repeat ABG if decline in mental status - Pulmonology follow-up as an outpatient  Hyponatremia: resolved  Sodium 138 this morning.  - Continue to monitor - Correct if  <120  [id number], improving Elevated BP on admission, 92/54 this am.   - Continue home Lisinopril-HCTZ, and metoprolol  - Continue to monitor   HLD: chronic Lipid panel 01/19/2015: LDL 119, Triglycerides 191. On home Lovastatin. Will discontinue Lovastatin and discharge patient on Atorvastatin. -ContinueAtorvastatin 40mg  QD  T2DM: chronic, uncontrolled A1C 10.2. BS ON 140's. Home Novolin 80U (supposed to take 40U BID, but admits to taking 80U QD) and Metformin. Per chart review, patient is unable to aford Lantus which is why he is on NPH. Has appt with new PCP in [place] on 11/1. Recommend patient discuss better diabetes regimin with PCP. Will also consult care management for further assistance. - Hold home insulin and metformin - Continue Lantus 20U qHS - mSSI  - Continue to monitor CBG's - Care management consulted   Fluids: Saline lock Electrolytes: Replace PRN Nutrition: Carb modified diet GI ppx: none DVT ppx: Lovenox  Disposition: Home pending improvement   Medications: Scheduled Meds: . aspirin  324 mg Oral Once  . atorvastatin  40 mg Oral q1800  . enoxaparin (LOVENOX) injection  40 mg Subcutaneous Q24H  . feeding supplement (GLUCERNA SHAKE)  237 mL Oral BID BM  . lisinopril  20 mg Oral Daily   And  . hydrochlorothiazide  25 mg Oral Daily  . insulin aspart  0-15 Units Subcutaneous TID WC  . insulin aspart  0-5 Units Subcutaneous QHS  . insulin glargine  20 Units Subcutaneous QHS  . levothyroxine  25 mcg Oral QAC breakfast  . mouth rinse  15 mL Mouth Rinse BID  . metoprolol tartrate  100 mg Oral BID  . prednisoLONE acetate  1 drop Both Eyes QID   Continuous Infusions: PRN Meds: ipratropium-albuterol  ================================================= ================================================= Subjective:  Patient  reports doing well overnight. He slept well without feelings of SOB or chest pain. Patient still denies an appetite, but tries to each  something. Patient has no complaints today.  Objective: Vital Signs Temp:  [97.8 F (36.6 C)-98.8 F (37.1 C)] 98.4 F (36.9 C) (10/20 1940) Pulse Rate:  [64-76] 71 (10/20 1824) Resp:  [12-21] 19 (10/20 1824) BP: (102-143)/(51-71) 102/51 (10/20 1627) SpO2:  [85 %-99 %] 95 % (10/20 1824) Weight:  [89.5 kg] 89.5 kg (10/20 0438)  Intake/Output 10/19 0701 - 10/20 0700 In: 1218.3 [P.O.:510; IV Piggyback:708.3] Out: 3420 [Urine:3420]  Physical Exam:  Gen: NAD, alert, non-toxic, well-appearing, sitting comfortably in bed Skin: Warm and dry. No obvious rashes, lesions, or trauma. HEENT: NCAT No conjunctival pallor or injection. No scleral icterus or injection.  MMM.  CV: RRR. <2s capillary refill bilaterally.  RP & DPs 2+ bilaterally.  Resp: CTAB.  No wheezing, rales, abnormal lung sounds.  No increased WOB Abd: NTND on palpation to all 4 quadrants.  Positive bowel sounds. Psych: Cooperative with exam. Pleasant. Makes eye contact. Speech normal. Extremities: Moves all extremities spontaneously  Neuro: CN II-XII grossly intact. No FNDs. A&Ox3. Able to state the year but was never able to tell me who the president was.    Laboratory: Recent Labs  Lab 03/13/18 0057  WBC 10.3  HGB 16.9  HCT 51.3  PLT 200   Recent Labs  Lab 03/13/18 0057 03/14/18 0403  NA 132* 140  K 4.2 3.6  CL 91* 89*  CO2 32 44*  BUN 11 13  CREATININE 0.89 1.15  CALCIUM 10.2 10.5*  PROT 6.0*  --   BILITOT 1.7*  --   ALKPHOS 86  --   ALT 11  --   AST 26  --   GLUCOSE 118* 124*   Imaging/Diagnostic Tests: Dg Chest 2 View  Result Date: 03/13/2018 CLINICAL DATA:  72 y/o  M; shortness of breath. EXAM: CHEST - 2 VIEW COMPARISON:  None. FINDINGS: Mildly enlarged cardiac silhouette given projection and technique. Calcified pleural plaques. Bilateral pleural thickening versus small pleural effusions. Patchy central and basilar airspace opacities. Bones are unremarkable. IMPRESSION: 1. Patchy central and  basilar airspace opacities, possibly chronic lung disease or pneumonia. 2. Calcified pleural plaques. Bilateral pleural thickening versus small pleural effusions. 3. Mildly enlarged cardiac silhouette. Electronically Signed   By: Mitzi Hansen M.D.   On: 03/13/2018 01:51   Ct Chest Wo Contrast  Result Date: 03/13/2018 CLINICAL DATA:  Shortness of breath. EXAM: CT CHEST WITHOUT CONTRAST TECHNIQUE: Multidetector CT imaging of the chest was performed following the standard protocol without IV contrast. COMPARISON:  Radiographs of same day. FINDINGS: Cardiovascular: Atherosclerosis of thoracic aorta is noted without aneurysm formation. Normal cardiac size. Mild coronary artery calcifications are noted. No pericardial effusion is noted. Mediastinum/Nodes: Thyroid gland and esophagus are unremarkable. Calcified lymph nodes are noted in the mediastinum most consistent with prior granulomatous disease. Lungs/Pleura: No pneumothorax is noted. Calcified pleural plaques are noted bilaterally consistent with asbestos exposure. Mild bibasilar subsegmental atelectasis or scarring is noted. Significant pleural thickening is noted in medial portion of right lower lobe which may represent scarring, but possible mass cannot be excluded. Upper Abdomen: No acute abnormality. Musculoskeletal: No chest wall mass or suspicious bone lesions identified. IMPRESSION: Calcified pleural plaques are noted bilaterally consistent with asbestos exposure. Significant pleural thickening is noted along the medial portion of right lower lobe which most likely represent scarring, but possible neoplasm cannot be excluded. Follow-up CT scan of  the chest in 3 months is recommended to ensure stability. Calcified mediastinal lymph nodes are noted most consistent with prior granulomatous disease. Mild bibasilar subsegmental atelectasis or scarring is noted. Aortic aneurysm NOS (ICD10-I71.9). Electronically Signed   By: Lupita Raider, M.D.    On: 03/13/2018 15:37     Joana Reamer, DO 03/14/2018, 8:53 PM PGY-1, [place] [facility] FPTS Intern pager: [phone], text pages welcome

## 2018-03-15 ENCOUNTER — Encounter (INDEPENDENT_AMBULATORY_CARE_PROVIDER_SITE_OTHER): Payer: Medicare HMO | Admitting: Ophthalmology

## 2018-03-15 DIAGNOSIS — J9601 Acute respiratory failure with hypoxia: Secondary | ICD-10-CM

## 2018-03-15 DIAGNOSIS — J929 Pleural plaque without asbestos: Secondary | ICD-10-CM

## 2018-03-15 DIAGNOSIS — J9602 Acute respiratory failure with hypercapnia: Secondary | ICD-10-CM

## 2018-03-15 LAB — BASIC METABOLIC PANEL
ANION GAP: 7 (ref 5–15)
BUN: 31 mg/dL — ABNORMAL HIGH (ref 8–23)
CHLORIDE: 90 mmol/L — AB (ref 98–111)
CO2: 41 mmol/L — ABNORMAL HIGH (ref 22–32)
Calcium: 10.4 mg/dL — ABNORMAL HIGH (ref 8.9–10.3)
Creatinine, Ser: 1.23 mg/dL (ref 0.61–1.24)
GFR calc Af Amer: >60 mL/min (ref >60)
GFR, EST NON AFRICAN AMERICAN: 57 mL/min — AB (ref >60)
GLUCOSE: 141 mg/dL — AB (ref 70–99)
POTASSIUM: 3.6 mmol/L (ref 3.5–5.1)
Sodium: 138 mmol/L (ref 135–145)

## 2018-03-15 LAB — GLUCOSE, CAPILLARY
GLUCOSE-CAPILLARY: 343 mg/dL — AB (ref 70–99)
Glucose-Capillary: 138 mg/dL — ABNORMAL HIGH (ref 70–99)
Glucose-Capillary: 212 mg/dL — ABNORMAL HIGH (ref 70–99)
Glucose-Capillary: 277 mg/dL — ABNORMAL HIGH (ref 70–99)

## 2018-03-15 NOTE — Progress Notes (Signed)
RT removed BIPAP and placed pt on 2L [place]. Pt tol well. Pt had no increased WOB

## 2018-03-15 NOTE — Discharge Summary (Signed)
Family Medicine Teaching [facility] Discharge Summary  Patient name: Robert Ramirez Medical record number: [id number] Date of birth: Feb 18, 1946 Age: 72 y.o. Gender: male Date of Admission: 03/13/2018  Date of Discharge: 03/20/2018 Admitting Physician: Robert Ramp, MD  Primary Care Provider: Patient, No Pcp Per Consultants: Pulmonology, Cardiology  Indication for Hospitalization: Worsening SOB and LE edema  Discharge Diagnoses/Problem List:  Patient Active Problem List   Diagnosis Date Noted  . Pleural plaque 03/14/2018  . Essential hypertension 03/14/2018  . Diabetes (HCC) 03/14/2018  . Acute respiratory failure with hypoxia and hypercarbia (HCC)   . Somnolence   . Hyponatremia   . Serum total bilirubin elevated   . Abnormal chest x-ray    Disposition: Home  Discharge Condition: Stable  Discharge Exam:  Physical Exam:  General:older male sitting in chair, eating breakfast Cardiovascular:RRR, no murmur Respiratory:CTAB, easy WOB Abdomen:SNTND, +BS Extremities:no edema  Brief Hospital Course:  Robert Ramirez is a 72 y.o. male with past medical history significant for T2DM, HTN, and HLD, who presented with worsening dyspnea and lower extremity edema and found to have acute on chronic hypercapnic respiratory failure secondary to interstitial lung disease likely from occupational asbestos exposure and chronic untreated sleep apnea.   Initially patient was treated with a dose of IV ceftriaxone and azithromycin for presumed pneumonia but given clinical picture antibiotics were discontinued. Patient was also diuresed initially due to concern for new onset HF in setting of worsening SOB and LE pitting edema. Echo significant for EF 60-65% with dilated RV so IV lasix were discontinued.  Due to the evidence of dilation of his right ventricle and concern for pulmonary HTN, cardiology was consulted for further work-up which recommended a nuclear stress test and cardiac  catheterization. Nuclear stress test unremarkable with a small region of anteroapical ischemia, mild LV hypokinesis, and LV EF of 47%. Catheterization significant for 60% stenosis of mid-distal RCA without need for intervention and mildly elevated RV and pulmonary pressure. Patient was cleared by cardiology with recommendations of daily 81mg  aspirin.   During admission patient experienced episodes of severe stupor. Initial work up included an ABG which was significant for partially compensated respiratory acidosis secondary to acute on chronic hypercapnic hypoxic respiratory failure. Pulmonology was consulted and patient was placed on BiPAP. Further work-up included CT chest which was positive for calcified pleural plaques and significant pleural thickening likely representing scarring consistent with exposure to asbestos. Recommend repeat CT scan in 3 months. Over the course of his hospital stay, patient's respiratory status as well as his mental status improved and patient was able to remain stable on 1-2L  with BiPAP use at night. Patient was discharged with home noninvasive ventilator to be used nightly, home oxygen to be used daily (1L), and close follow-up with pulmonology on 03/29/18 for PFT's and sleep study. There was additional concern for other possible etiologies for his hypercapnia. Myasthenia gravis workup was conducted and was pending upon discharge.  Incidental finding during admission included a large violaceous patch with scattered nodules on his right temporal and parietal scalp. Concern for angiosarcoma due to location, clinical picture, and recent history of reported unintentional weight loss of 20-30 pounds. MRI head included concern for possible cutaneous malignancy as well. Recommend biopsy and referral to dermatology for further workup.   At time of discharge, patient was improved and hemodynamically stable with close follow-up arranged.   Issues for Follow Up:  1. Outpatient  sleep study for concern of sleep apnea 2. Ensure follow-up with Pulmonology  and PFT's outpatient 3. Follow-up CT scan in 3 months for pleural plaques 4. Ensure patient followed-up with Dermatology for suspicious violet patch on scalp 5. Ensure patient is compliant with BiPAP and oxygen use  6. Poor blood sugar control in past. Please consider change in management for better BS control 7. Recommend further workup into unintentional weight loss  Significant Procedures: CT chest w/out contrast, Echocardiogram, Nuclear stress test, cardiac catheterization, MRI brain  Significant Labs and Imaging:  Recent Labs  Lab 03/18/18 0320  WBC 8.0  HGB 15.1  HCT 48.4  PLT 201   Recent Labs  Lab 03/14/18 0403 03/15/18 0307 03/16/18 0348 03/17/18 0320 03/18/18 0320  NA 140 138 136 136 137  K 3.6 3.6 5.0 4.2 3.8  CL 89* 90* 88* 95* 96*  CO2 44* 41* 42* 32 35*  GLUCOSE 124* 141* 240* 181* 200*  BUN 13 31* 37* 24* 29*  CREATININE 1.15 1.23 1.27* 0.96 1.17  CALCIUM 10.5* 10.4* 10.6* 10.1 10.0    Dg Chest 2 View  Result Date: 03/13/2018 CLINICAL DATA:  72 y/o  M; shortness of breath. EXAM: CHEST - 2 VIEW COMPARISON:  None. FINDINGS: Mildly enlarged cardiac silhouette given projection and technique. Calcified pleural plaques. Bilateral pleural thickening versus small pleural effusions. Patchy central and basilar airspace opacities. Bones are unremarkable. IMPRESSION: 1. Patchy central and basilar airspace opacities, possibly chronic lung disease or pneumonia. 2. Calcified pleural plaques. Bilateral pleural thickening versus small pleural effusions. 3. Mildly enlarged cardiac silhouette. Electronically Signed   By: Mitzi Hansen M.D.   On: 03/13/2018 01:51   Ct Chest Wo Contrast  Result Date: 03/13/2018 CLINICAL DATA:  Shortness of breath. EXAM: CT CHEST WITHOUT CONTRAST TECHNIQUE: Multidetector CT imaging of the chest was performed following the standard protocol without IV contrast.  COMPARISON:  Radiographs of same day. FINDINGS: Cardiovascular: Atherosclerosis of thoracic aorta is noted without aneurysm formation. Normal cardiac size. Mild coronary artery calcifications are noted. No pericardial effusion is noted. Mediastinum/Nodes: Thyroid gland and esophagus are unremarkable. Calcified lymph nodes are noted in the mediastinum most consistent with prior granulomatous disease. Lungs/Pleura: No pneumothorax is noted. Calcified pleural plaques are noted bilaterally consistent with asbestos exposure. Mild bibasilar subsegmental atelectasis or scarring is noted. Significant pleural thickening is noted in medial portion of right lower lobe which may represent scarring, but possible mass cannot be excluded. Upper Abdomen: No acute abnormality. Musculoskeletal: No chest wall mass or suspicious bone lesions identified. IMPRESSION: Calcified pleural plaques are noted bilaterally consistent with asbestos exposure. Significant pleural thickening is noted along the medial portion of right lower lobe which most likely represent scarring, but possible neoplasm cannot be excluded. Follow-up CT scan of the chest in 3 months is recommended to ensure stability. Calcified mediastinal lymph nodes are noted most consistent with prior granulomatous disease. Mild bibasilar subsegmental atelectasis or scarring is noted. Aortic aneurysm NOS (ICD10-I71.9). Electronically Signed   By: Lupita Raider, M.D.   On: 03/13/2018 15:37   Mr [name] ZO Contrast  Result Date: 03/19/2018 CLINICAL DATA:  Altered mental status EXAM: MRI HEAD WITHOUT AND WITH CONTRAST TECHNIQUE: Multiplanar, multiecho pulse sequences of the brain and surrounding structures were obtained without and with intravenous contrast. CONTRAST:  10 mL Gadavist COMPARISON:  None. FINDINGS: BRAIN: There is no acute infarct, acute hemorrhage, hydrocephalus or extra-axial collection. The midline structures are normal. No midline shift or other mass effect.  There are no old infarcts. The white matter signal is normal for the patient's  age. The cerebral and cerebellar volume are age-appropriate. Susceptibility-sensitive sequences show no chronic microhemorrhage or superficial siderosis. VASCULAR: Major intracranial arterial and venous sinus flow voids are normal. SKULL AND UPPER CERVICAL SPINE: Right parietal scalp lesion shows diffuse contrast enhancement and measures approximately 9.6 cm in length by 1 cm in thickness. The lesion begins just posterior to the right ear. SINUSES/ORBITS: No fluid levels or advanced mucosal thickening. No mastoid or middle ear effusion. The orbits are normal. IMPRESSION: 1. Diffusely contrast-enhancing, sessile, masslike lesion of the right posterior scalp, beginning just posterior to the right ear and extending to the posterior midline of the scalp. This is concerning for a cutaneous malignancy. Direct inspection is recommended, with consideration of histologic sampling. 2. Normal brain. Electronically Signed   By: Deatra Robinson M.D.   On: 03/19/2018 14:53   Nm Myocar Multi W/spect W/wall Motion / Ef  Result Date: 03/16/2018 CLINICAL DATA:  Acute coronary syndrome. Hypertension, diabetes, previous tobacco abuse, shortness of breath EXAM: MYOCARDIAL IMAGING WITH SPECT (REST AND PHARMACOLOGIC-STRESS) GATED LEFT VENTRICULAR WALL MOTION STUDY LEFT VENTRICULAR EJECTION FRACTION TECHNIQUE: Standard myocardial SPECT imaging was performed after resting intravenous injection of 10 mCi Tc-26m tetrofosmin. Subsequently, intravenous infusion of Lexiscan was performed under the supervision of the Cardiology staff. At peak effect of the drug, 30 mCi Tc-78m tetrofosmin was injected intravenously and standard myocardial SPECT imaging was performed. Quantitative gated imaging was also performed to evaluate left ventricular wall motion, and estimate left ventricular ejection fraction. COMPARISON:  None. FINDINGS: Perfusion: Small area of mildly  decreased activity in the anteroapical/apicoseptal region of the left ventricular myocardium on the stress study. This is partially improved on the resting study. Otherwise physiologic distribution of radiopharmaceutical. Wall Motion: Mild hypokinesis.  No left ventricular dilatation. Left Ventricular Ejection Fraction: 47 % End diastolic volume 99 ml End systolic volume 52 ml IMPRESSION: 1. Small region of anteroapical ischemia.  No definite infarct. 2. Mild left ventricular hypokinesis without LVE. 3. Left ventricular ejection fraction 47% 4. Non invasive risk stratification*: Intermediate *2012 Appropriate Use Criteria for Coronary Revascularization Focused Update: J Am Coll Cardiol. 2012;59(9):857-881. http://content.dementiazones.com.aspx?articleid=[email or web address] Electronically Signed   By: Corlis Leak M.D.   On: 03/16/2018 14:04    Results/Tests Pending at Time of Discharge: Acetylcholine receptor antibody   Discharge Medications:  Allergies as of 03/20/2018   No Known Allergies     Medication List    STOP taking these medications   lisinopril-hydrochlorothiazide 10-12.5 MG tablet Commonly known as:  PRINZIDE,ZESTORETIC   lisinopril-hydrochlorothiazide 20-25 MG tablet Commonly known as:  PRINZIDE,ZESTORETIC   lovastatin 20 MG tablet Commonly known as:  MEVACOR   metoprolol tartrate 100 MG tablet Commonly known as:  LOPRESSOR     TAKE these medications   aspirin 81 MG EC tablet Take 1 tablet (81 mg total) by mouth daily. Start taking on:  03/21/2018   atorvastatin 40 MG tablet Commonly known as:  LIPITOR Take 1 tablet (40 mg total) by mouth daily at 6 PM.   insulin NPH Human 100 UNIT/ML injection Commonly known as:  HUMULIN N,NOVOLIN N Inject 0.15 mLs (15 Units total) into the skin 2 (two) times daily before a meal. What changed:    how much to take  when to take this   levothyroxine 25 MCG tablet Commonly known as:  SYNTHROID, LEVOTHROID Take 25 mcg by mouth daily.    lisinopril 10 MG tablet Commonly known as:  PRINIVIL,ZESTRIL Take 1 tablet (10 mg total) by mouth daily. Start taking on:  03/21/2018   metFORMIN 1000 MG tablet Commonly known as:  GLUCOPHAGE Take 1,000 mg by mouth 2 (two) times daily with a meal.   prednisoLONE acetate 1 % ophthalmic suspension Commonly known as:  PRED FORTE Place 1 drop into both eyes 4 (four) times daily.       Discharge Instructions: Please refer to Patient Instructions section of EMR for full details.  Patient was counseled important signs and symptoms that should prompt return to medical care, changes in medications, dietary instructions, activity restrictions, and follow up appointments.   Follow-Up Appointments: Follow-up Information    Coral Ceo, NP Follow up on 03/30/2018.   Specialty:  Pulmonary Disease Why:  9:00 AM. [facility]. 2nd Floor.  Contact information: [address]nd [address] [place] [address] [phone]        Orpah Cobb, MD. Schedule an appointment as soon as possible for a visit in 2 week(s).   Specialty:  Cardiology Contact information: [address] [place] [address] [phone]           Joana Reamer, DO 03/20/2018, 11:24 PM PGY-1, [facility]

## 2018-03-15 NOTE — Consult Note (Signed)
Referring Physician: Dr. Mullis/Dr. Lucita Ferrara Robert Ramirez is an 72 y.o. male.                       Chief Complaint: Shortness of breath  HPI: 72 year old male presented with acute respiratory failure with hypercarbia and asbestos lung disease. He had shortness of breath with activity. He denies chest pain. His echocardiogram shows preserved LV systolic function but dilated RV with mild dilatation and dysfunction. He has cardiac risk factors of hypertension, type 2 DM and hyperlipidemia. He quit smoking many years ago.  Past Medical History:  Diagnosis Date  . Diabetes mellitus   . High cholesterol   . Hypertension       Past Surgical History:  Procedure Laterality Date  . APPENDECTOMY      Family History  Problem Relation Age of Onset  . Heart failure Mother   . Cancer Father   . Diabetes Sister   . Cancer Sister    Social History:  reports that he has quit smoking. He has never used smokeless tobacco. He reports that he does not drink alcohol or use drugs.  Allergies: No Known Allergies  Medications Prior to Admission  Medication Sig Dispense Refill  . insulin NPH Human (HUMULIN N,NOVOLIN N) 100 UNIT/ML injection Inject 80 Units into the skin daily.    Marland Kitchen levothyroxine (SYNTHROID, LEVOTHROID) 25 MCG tablet Take 25 mcg by mouth daily.    Marland Kitchen lisinopril-hydrochlorothiazide (PRINZIDE,ZESTORETIC) 20-25 MG tablet Take 1 tablet by mouth daily.    Marland Kitchen lovastatin (MEVACOR) 20 MG tablet Take 20 mg by mouth every evening.    . metFORMIN (GLUCOPHAGE) 1000 MG tablet Take 1,000 mg by mouth 2 (two) times daily with a meal.      . metoprolol tartrate (LOPRESSOR) 100 MG tablet Take 50 mg by mouth daily.    . prednisoLONE acetate (PRED FORTE) 1 % ophthalmic suspension Place 1 drop into both eyes 4 (four) times daily.  1  . lisinopril-hydrochlorothiazide (PRINZIDE) 10-12.5 MG per tablet Take 1 tablet by mouth daily. (Patient not taking: Reported on 03/13/2018) 30 tablet 1    Results for orders  placed or performed during the hospital encounter of 03/13/18 (from the past 48 hour(s))  Glucose, capillary     Status: Abnormal   Collection Time: 03/13/18  9:39 PM  Result Value Ref Range   Glucose-Capillary 266 (H) 70 - 99 mg/dL  Basic metabolic panel     Status: Abnormal   Collection Time: 03/14/18  4:03 AM  Result Value Ref Range   Sodium 140 135 - 145 mmol/L    Comment: DELTA CHECK NOTED   Potassium 3.6 3.5 - 5.1 mmol/L   Chloride 89 (L) 98 - 111 mmol/L   CO2 44 (H) 22 - 32 mmol/L   Glucose, Bld 124 (H) 70 - 99 mg/dL   BUN 13 8 - 23 mg/dL   Creatinine, Ser 1.15 0.61 - 1.24 mg/dL   Calcium 10.5 (H) 8.9 - 10.3 mg/dL   GFR calc non Af Amer >60 >60 mL/min   GFR calc Af Amer >60 >60 mL/min    Comment: (NOTE) The eGFR has been calculated using the CKD EPI equation. This calculation has not been validated in all clinical situations. eGFR's persistently <60 mL/min signify possible Chronic Kidney Disease.    Anion gap 7 5 - 15    Comment: Performed at [facility] [address], [place], [place] [address]  Glucose, capillary  Status: None   Collection Time: 03/14/18  6:12 AM  Result Value Ref Range   Glucose-Capillary 99 70 - 99 mg/dL  Blood gas, arterial     Status: Abnormal   Collection Time: 03/14/18  9:00 AM  Result Value Ref Range   O2 Content 3.0 L/min   pH, Arterial 7.345 (L) 7.350 - 7.450   pCO2 arterial 86.0 (HH) 32.0 - 48.0 mmHg    Comment: CRITICAL RESULT CALLED TO, READ BACK BY AND VERIFIED WITH: BECKEY FLOWERS RRT RCP AT 0905, BY NIKKI NELSON RRT RCP ON 02/2018    pO2, Arterial 77.9 (L) 83.0 - 108.0 mmHg   Bicarbonate 45.7 (H) 20.0 - 28.0 mmol/L   Acid-Base Excess 19.1 (H) 0.0 - 2.0 mmol/L   O2 Saturation 95.5 %   Patient temperature 98.6    Collection site RIGHT RADIAL    Drawn by [id number]    Sample type ARTERIAL DRAW    Allens test (pass/fail) PASS PASS  Glucose, capillary     Status: Abnormal   Collection Time: 03/14/18 12:32 PM  Result Value  Ref Range   Glucose-Capillary 152 (H) 70 - 99 mg/dL  Blood gas, arterial     Status: Abnormal   Collection Time: 03/14/18  1:33 PM  Result Value Ref Range   FIO2 32.00    Delivery systems NASAL CANNULA    pH, Arterial 7.338 (L) 7.350 - 7.450   pCO2 arterial 85.6 (HH) 32.0 - 48.0 mmHg    Comment: CRITICAL RESULT CALLED TO, READ BACK BY AND VERIFIED WITH: BECKY FLOWERS, RRT BY HEATHER ADKINS, RRT AT 1423 ON 03/14/18    pO2, Arterial 90.8 83.0 - 108.0 mmHg   Bicarbonate 44.7 (H) 20.0 - 28.0 mmol/L   Acid-Base Excess 18.0 (H) 0.0 - 2.0 mmol/L   O2 Saturation 97.0 %   Patient temperature 98.6    Drawn by [id number]    Sample type ARTERIAL DRAW    Allens test (pass/fail) PASS PASS  Glucose, capillary     Status: Abnormal   Collection Time: 03/14/18  3:56 PM  Result Value Ref Range   Glucose-Capillary 217 (H) 70 - 99 mg/dL  Brain natriuretic peptide     Status: None   Collection Time: 03/14/18  4:14 PM  Result Value Ref Range   B Natriuretic Peptide 34.2 0.0 - 100.0 pg/mL    Comment: Performed at [facility], [address], [place], [place] [address]  Blood gas, arterial     Status: Abnormal   Collection Time: 03/14/18  5:45 PM  Result Value Ref Range   FIO2 0.30    Delivery systems BILEVEL POSITIVE AIRWAY PRESSURE    Inspiratory PAP 14    Expiratory PAP 6    pH, Arterial 7.418 7.350 - 7.450   pCO2 arterial 68.2 (HH) 32.0 - 48.0 mmHg    Comment: CRITICAL RESULT CALLED TO, READ BACK BY AND VERIFIED WITH: BECKY FLOWERS, RRT @ 17:58 BY SHONDA PULLIAM, RRT ON 03/14/18    pO2, Arterial 98.4 83.0 - 108.0 mmHg   Bicarbonate 43.2 (H) 20.0 - 28.0 mmol/L   Acid-Base Excess 17.5 (H) 0.0 - 2.0 mmol/L   O2 Saturation 98.3 %   Patient temperature 98.6    Collection site RIGHT RADIAL    Drawn by [id number]    Sample type ARTERIAL DRAW    Allens test (pass/fail) PASS PASS  Blood gas, arterial     Status: Abnormal   Collection Time: 03/14/18  9:09 PM  Result Value  Ref Range   O2 Content  2.0 L/min   Delivery systems NASAL CANNULA    pH, Arterial 7.433 7.350 - 7.450   pCO2 arterial 66.0 (HH) 32.0 - 48.0 mmHg    Comment: CRITICAL RESULT CALLED TO, READ BACK BY AND VERIFIED WITH: TO AGUII, REDELE, RN AT 2118 BY WHITLEY STOCKENAUER, RRT ON 03/14/2018    pO2, Arterial 74.2 (L) 83.0 - 108.0 mmHg   Bicarbonate 43.5 (H) 20.0 - 28.0 mmol/L   Acid-Base Excess 17.9 (H) 0.0 - 2.0 mmol/L   O2 Saturation 96.3 %   Patient temperature 98.3    Collection site RIGHT RADIAL    Drawn by [id number]    Sample type ARTERIAL DRAW    Allens test (pass/fail) PASS PASS  Glucose, capillary     Status: Abnormal   Collection Time: 03/14/18  9:30 PM  Result Value Ref Range   Glucose-Capillary 247 (H) 70 - 99 mg/dL  Basic metabolic panel     Status: Abnormal   Collection Time: 03/15/18  3:07 AM  Result Value Ref Range   Sodium 138 135 - 145 mmol/L   Potassium 3.6 3.5 - 5.1 mmol/L   Chloride 90 (L) 98 - 111 mmol/L   CO2 41 (H) 22 - 32 mmol/L   Glucose, Bld 141 (H) 70 - 99 mg/dL   BUN 31 (H) 8 - 23 mg/dL   Creatinine, Ser 1.23 0.61 - 1.24 mg/dL   Calcium 10.4 (H) 8.9 - 10.3 mg/dL   GFR calc non Af Amer 57 (L) >60 mL/min   GFR calc Af Amer >60 >60 mL/min    Comment: (NOTE) The eGFR has been calculated using the CKD EPI equation. This calculation has not been validated in all clinical situations. eGFR's persistently <60 mL/min signify possible Chronic Kidney Disease.    Anion gap 7 5 - 15    Comment: Performed at [facility] [address] [place], [place] [address]  Glucose, capillary     Status: Abnormal   Collection Time: 03/15/18  6:06 AM  Result Value Ref Range   Glucose-Capillary 138 (H) 70 - 99 mg/dL  Glucose, capillary     Status: Abnormal   Collection Time: 03/15/18 11:46 AM  Result Value Ref Range   Glucose-Capillary 212 (H) 70 - 99 mg/dL  Glucose, capillary     Status: Abnormal   Collection Time: 03/15/18  5:08 PM  Result Value Ref Range   Glucose-Capillary 343 (H) 70  - 99 mg/dL   No results found.  Review Of Systems Constitutional: No fever, chills, weight loss or gain. Eyes: No vision change, wears glasses. No discharge or pain. Ears: No hearing loss, No tinnitus. Respiratory: No asthma, COPD, pneumonias. Positive shortness of breath and asbestos exposure. No hemoptysis. Cardiovascular: No chest pain, palpitation, positive leg edema. Gastrointestinal: No nausea, vomiting, diarrhea, constipation. No GI bleed. No hepatitis. Genitourinary: No dysuria, hematuria, kidney stone. No incontinance. Neurological: No headache, stroke, seizures.  Psychiatry: No psych facility admission for anxiety, depression, suicide. No detox. Skin: No rash. Musculoskeletal: Positive joint pain, no fibromyalgia. No neck pain, positive back pain. Lymphadenopathy: No lymphadenopathy. Hematology: No anemia or easy bruising.   Blood pressure 133/61, pulse 76, temperature 99.2 F (37.3 C), temperature source Oral, resp. rate 19, height 6' 2"  (1.88 m), weight 88.2 kg, SpO2 93 %. Body mass index is 24.96 kg/m. General appearance: alert, cooperative, appears stated age and no distress Head: Normocephalic, atraumatic. Throat- pink, mid-line tongue. Edentuous. Eyes: Blue eyes, pink conjunctiva,  corneas clear. PERRL, EOM's intact. Mild bilateral haziness of lens. Neck: No adenopathy, no carotid bruit, no JVD, supple, symmetrical, trachea midline and thyroid not enlarged. Resp: Clearing to auscultation bilaterally. Cardio: Regular rate and rhythm, S1, S2 normal, II/VI systolic murmur, no click, rub or gallop GI: Soft, non-tender; bowel sounds normal; no organomegaly. Extremities: No edema, cyanosis or clubbing. Skin: Warm and dry.  Neurologic: Alert and oriented X 3, normal strength. Normal coordination.  Assessment/Plan Shortness of breath Acute respiratory failure with hypercarbia Mild RV systolic dysfunction RBBB + right posterior fascicular block Type 2 DM,  uncontrolled Hypertension Hyperlipidemia Abnormal troponin I, demand ischemia  Elderly patient with shortness of breath on exertion needs cardiac evaluation. RV systolic dysfunction and dilatation is probably secondary to chronic pulmonary hypertension. Discussed nuclear stress test in AM and possible R + L heart cath. Patient agrees to cardiac testing.  Birdie Riddle, MD  03/15/2018, 5:55 PM

## 2018-03-15 NOTE — Progress Notes (Addendum)
PULMONARY / CRITICAL CARE MEDICINE   NAME:  Robert Ramirez, MRN:  [id number], DOB:  [birth year], LOS: 1 ADMISSION DATE:  03/13/2018, CONSULTATION DATE: 03/14/2018 REFERRING MD: Dr. 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  HISTORY OF PRESENT ILLNESS   Patient presented with worsening shortness of breath, lower extremity edema, not acting his usual according to family member Symptoms of orthopnea Leg edema has progressed over the last few months Denies any fevers or chills Has not been coughing much more than his usual Quit smoking over 35 years ago  SIGNIFICANT PAST MEDICAL HISTORY   Progressive shortness of breath led to recent presentation History of diabetes History of hypertension No diagnosis of lung disease in the past  SIGNIFICANT EVENTS:  Hypercapnic respiratory failure, STUDIES:   ABG revealing acute hypercapnia, likely a component of chronic CO2 retention at baseline CULTURES:  Blood cultures on 03/13/2018-  ANTIBIOTICS:  Azithromycin on 03/13/2018 Rocephin on 03/13/2018  LINES/TUBES:   Peripheral access CONSULTANTS:   SUBJECTIVE:  Feels better today. Notes the swelling on his legs is much better, and is breathing easier.   CONSTITUTIONAL: BP 107/62 (BP Location: Left Arm)   Pulse (!) 59   Temp (!) 96.8 F (36 C) (Axillary)   Resp 19   Ht 6\' 2"  (1.88 m)   Wt 88.2 kg   SpO2 96%   BMI 24.96 kg/m   I/O last 3 completed shifts: In: 480 [P.O.:480] Out: 2850 [Urine:2850]  PHYSICAL EXAM: General: Elderly gentleman in NAD Neuro: Alert, oriented, non-focal.  HEENT: Circle/AT, PERRL, no JVD Cardiovascular: S1-S2 appreciated Lungs: Bilateral rales, unlabored on 3L [place] Abdomen: Bowel sounds appreciated Musculoskeletal: No  deformity Skin: Skin is warm and dry  RESOLVED PROBLEM LIST   ASSESSMENT AND PLAN   Acute hypercapnic respiratory failure: improved. Off BiPAP since yesterday. His symptoms sound more closely related to diastolic CHF/pulmonary hypertension.  - See plan as below.  - Repeat ABG only for mental status decline.   Chronic respiratory failure -This may be secondary to restrictive lung disease and obstructive lung disease with his history of smoking in the past. - Pulmonary follow up and PFTs as an outpatient.  - PRN bronchodilators   Suspect pulmonary hypertension even though the pressures could not be adequately estimated on the current echocardiogram, presence of atrial dilatation and reduced right systolic pressures May also have a component of right heart failure contributing to current presentation. Has responded well to diuresis - Diurese as tolerated. 2L negative so far.   Pleural plaques -Contribute to his restrictive physiology, worked with insulation in the past. -Progressive disease, repeat CT in 3 months to track progression.   Diabetes - Per primary  Hypertension - Per primary   SUMMARY OF TODAY'S PLAN:  Diuresis as tolerated Will need pulmonary follow up as an outpatient Supplemental O2 BiPAP as needed  Best Practice / Goals of Care / Disposition.   DVT PROPHYLAXIS: On Lovenox NUTRITION: As tolerated MOBILITY: As tolerated FAMILY DISCUSSIONS: Discussed with patient and spouse   LABS  Glucose Recent Labs  Lab 03/14/18 0612 03/14/18 1232 03/14/18 1556 03/14/18 2130 03/15/18 0606 03/15/18 1146  GLUCAP 99 152* 217* 247* 138* 212*    BMET Recent Labs  Lab 03/13/18 0057 03/14/18 0403 03/15/18 0307  NA 132* 140 138  K 4.2 3.6 3.6  CL 91* 89* 90*  CO2 32 44* 41*  BUN 11 13 31*  CREATININE 0.89 1.15 1.23  GLUCOSE 118* 124* 141*    Liver Enzymes Recent Labs  Lab 03/13/18 0057  AST 26  ALT 11  ALKPHOS 86  BILITOT 1.7*  ALBUMIN 3.3*     Electrolytes Recent Labs  Lab 03/13/18 0057 03/14/18 0403 03/15/18 0307  CALCIUM 10.2 10.5* 10.4*    CBC Recent Labs  Lab 03/13/18 0057  WBC 10.3  HGB 16.9  HCT 51.3  PLT 200    ABG Recent Labs  Lab 03/14/18 1333 03/14/18 1745 03/14/18 2109  PHART 7.338* 7.418 7.433  PCO2ART 85.6* 68.2* 66.0*  PO2ART 90.8 98.4 74.2*    Coag's No results for input(s): APTT, INR in the last 168 hours.  Sepsis Markers Recent Labs  Lab 03/13/18 0454  LATICACIDVEN 0.65    Cardiac Enzymes Recent Labs  Lab 03/13/18 0618 03/13/18 1110 03/13/18 1702  TROPONINI 0.03* <0.03 0.03*     Joneen Roach, AGACNP-BC [facility] Pulmonary/Critical Care Pager [phone] or [phone]  03/15/2018 2:20 PM

## 2018-03-15 NOTE — Progress Notes (Addendum)
Inpatient Diabetes Program Recommendations  AACE/ADA: New Consensus Statement on Inpatient Glycemic Control (2015)  Target Ranges:  Prepandial:   less than 140 mg/dL      Peak postprandial:   less than 180 mg/dL (1-2 hours)      Critically ill patients:  140 - 180 mg/dL   Lab Results  Component Value Date   GLUCAP 138 (H) 03/15/2018   HGBA1C 10.2 (H) 03/13/2018    Review of Glycemic ControlResults for KEONTAY, VORA (MRN [id number]) as of 03/15/2018 11:38  Ref. Range 03/14/2018 06:12 03/14/2018 12:32 03/14/2018 15:56 03/14/2018 21:30 03/15/2018 06:06  Glucose-Capillary Latest Ref Range: 70 - 99 mg/dL 99 409 (H) 811 (H) 914 (H) 138 (H)   Diabetes history: Type 2  Outpatient Diabetes medications: NPH 80 units daily, Metformin 1000 mg daily Current orders for Inpatient glycemic control:  Lantus 20 units daily, Novolog moderate tid with meals Inpatient Diabetes Program Recommendations:   Blood sugars improved. Consider adding Novolog 3 units tid with meals (hold if patient eats less than 50%).    Thanks,  Beryl Meager, RN, BC-ADM Inpatient Diabetes Coordinator Pager [phone] Spoke with patient and wife regarding elevated A1C.  Patient states that he only takes his NPH once a day instead of 2 times daily since he does not like "sticking" himself. I explained that NPH does not last 24 hours and really needs to be split to 2 doses to last the entire time.  He states that fasting blood sugars are in the 150's however he admits to not checking his sugars the way he should.  Gave him handout on "Know your numbers" explaining current A1C results.  Patient seems mildly confused and asked me several times if I was going to let him out of hospital.  He was on Lantus in the past, however due to cost switched to NPH. Wife states that they have an appointment with a new PCP in [facility] on November 1.  MD, at discharge consider reducing NPH to 20 units bid?   Thanks,  Beryl Meager, RN, BC-ADM [phone])

## 2018-03-15 NOTE — Progress Notes (Signed)
CALL PAGER [phone] for any questions or notifications regarding this patient  FMTS Attending Note: Denny Levy MD 1. Hypercapnic hypoxic respiratory failure, acute on chronic.  Pulmonary service is consulting. Have also asked cardiology for evaluation as I suspect there is significant component of right heart failure. Denny Levy

## 2018-03-16 ENCOUNTER — Inpatient Hospital Stay (HOSPITAL_COMMUNITY): Payer: Medicare HMO

## 2018-03-16 LAB — BASIC METABOLIC PANEL
ANION GAP: 6 (ref 5–15)
BUN: 37 mg/dL — ABNORMAL HIGH (ref 8–23)
CHLORIDE: 88 mmol/L — AB (ref 98–111)
CO2: 42 mmol/L — AB (ref 22–32)
Calcium: 10.6 mg/dL — ABNORMAL HIGH (ref 8.9–10.3)
Creatinine, Ser: 1.27 mg/dL — ABNORMAL HIGH (ref 0.61–1.24)
GFR calc non Af Amer: 55 mL/min — ABNORMAL LOW (ref >60)
Glucose, Bld: 240 mg/dL — ABNORMAL HIGH (ref 70–99)
Potassium: 5 mmol/L (ref 3.5–5.1)
SODIUM: 136 mmol/L (ref 135–145)

## 2018-03-16 LAB — GLUCOSE, CAPILLARY
GLUCOSE-CAPILLARY: 226 mg/dL — AB (ref 70–99)
Glucose-Capillary: 221 mg/dL — ABNORMAL HIGH (ref 70–99)
Glucose-Capillary: 224 mg/dL — ABNORMAL HIGH (ref 70–99)
Glucose-Capillary: 237 mg/dL — ABNORMAL HIGH (ref 70–99)

## 2018-03-16 MED ORDER — REGADENOSON 0.4 MG/5ML IV SOLN
0.4000 mg | Freq: Once | INTRAVENOUS | Status: AC
  Administered 2018-03-16: 0.4 mg via INTRAVENOUS

## 2018-03-16 MED ORDER — SODIUM CHLORIDE 0.9 % IV SOLN
INTRAVENOUS | Status: DC
  Administered 2018-03-16: 22:00:00 via INTRAVENOUS

## 2018-03-16 MED ORDER — REGADENOSON 0.4 MG/5ML IV SOLN
INTRAVENOUS | Status: AC
  Filled 2018-03-16: qty 5

## 2018-03-16 MED ORDER — SODIUM CHLORIDE 0.9% FLUSH: 3.0000 mL | INTRAVENOUS | Status: DC | PRN

## 2018-03-16 MED ORDER — TECHNETIUM TC 99M TETROFOSMIN IV KIT
10.0000 | PACK | Freq: Once | INTRAVENOUS | Status: AC | PRN
  Administered 2018-03-16: 10 via INTRAVENOUS

## 2018-03-16 MED ORDER — SODIUM CHLORIDE 0.9 % IV SOLN: 250.0000 mL | INTRAVENOUS | Status: DC | PRN

## 2018-03-16 MED ORDER — TECHNETIUM TC 99M TETROFOSMIN IV KIT
30.0000 | PACK | Freq: Once | INTRAVENOUS | Status: AC | PRN
  Administered 2018-03-16: 30 via INTRAVENOUS

## 2018-03-16 MED ORDER — SODIUM CHLORIDE 0.9% FLUSH
3.0000 mL | Freq: Two times a day (BID) | INTRAVENOUS | Status: DC
  Administered 2018-03-16 – 2018-03-17 (×2): 3 mL via INTRAVENOUS

## 2018-03-16 MED ORDER — ASPIRIN 81 MG PO CHEW
81.0000 mg | CHEWABLE_TABLET | ORAL | Status: AC
  Administered 2018-03-17: 81 mg via ORAL
  Filled 2018-03-16: qty 1

## 2018-03-16 NOTE — Progress Notes (Signed)
Family Medicine Teaching Service Daily Progress Note Intern Pager: [phone]  Patient name: Robert Ramirez Medical record number: [id number] Date of birth: 03-21-1946 Age: 72 y.o. Gender: male  Primary Care Provider: Patient, No Pcp Per Consultants: Pulmonology, Cardiology Code Status: Full code  Pt Overview and Major Events to Date:  03/13/2018 Admitted 03/13/2018 Stepdown for BiPAP 03/13/2018 CT chest w/o contrast, Echocardiogram 03/16/2018 Nuclear stress test 03/17/2018 R&L cardiac cath Hospital Day: 5   Assessment and Plan: Robert Robertsonis a 72 y.o.malepresenting with several days of SOB. PMH is significant forT2DM, HTN, and HLD.  "Violacious Patch" on R temporal scalp: unknown etiology (see media below) Has been present for many years and grown in size considerably over time. No pain associated. Soft to palpation. Has never had it evaluated. Due to recent unintentional weight loss and clinical picture, differential includes angiosarcoma. Will obtain HIV screen to evaluate for potential Kaposi Sarcoma as well. Appearance not consistent with hemangioma. Consider biopsy and further work-up.  - Follow-up HIV  Hypercapnic Respiratory Failure likely 2/2 Asbestos: Acute on Chronic Hypoxia likely 2/2 to history of occupational asbestos exposure and chronic CO2 retention. Right heart failure likely due to lung disease. Cardiology and pulmonology following. Nuclear stress test significant for small region of anteroapical ischemia and mild LV hypokinesis. Right heart cath planned for today. Will follow-up. Patient tolerating 0.5-2L [place] with O2 sats 90-100% with BiPAP overnight. This morning patient is A7Ox3. Will ambulate with pulse ox prior to discharge. -Pulmonology following, appreciate recs: will trial bipap over night and attempt to d/c patient with this, outpatient sleep study and PFTs  - BiPAP ordered placed - BiPAP qHS   - F/u OP with pulmonology on 03/30/18 -cards following - R&L  heart cath today, will follow up -care mgmt c/s to get patient bipap -continuous PO2, [place] as needed and every night bipap  -daily wts, I/os -outpt chest CT 3 months  Possible Pulmonary HTN: Echo EF 60-65%, D-shaped septum, RV dilated. Nuclear stress test significant for small region of anteroapical ischemia and mild LV hypokinesis. Right and left heart cath scheduled for today.  - Cardiology and Pulmonology following - Follow-up cath  Hyponatremia: resolved  - monitor on daily labs  JYN:WGNFAOZ, stable, this am 122/62 - Continue home Lisinopril-HCTZ and metoprolol   HLD: chronic Lipid panel 01/19/2015: LDL 119, Triglycerides 191 -ContinueAtorvastatin 40mg  QD  T2DM: chronic, uncontrolled. A1C 10.2. Per chart review, patient is unable to aford Lantus which is why he is on NPH. BS this AM: 167. However has been NPO due to cath. May consider increasing lantus tomorrow if continues to be extremely high once diet is resumed. - Continue Lantus 20U qHS - mSSI  - CBGs AC/HS - Care management consulted for help affording medications/bipap  Fluids: Saline lock Electrolytes: Replace PRN Nutrition: Carb modified diet GI ppx: none DVT ppx: Lovenox  Disposition: Home pending improvement   Medications: Scheduled Meds: . atorvastatin  40 mg Oral q1800  . enoxaparin (LOVENOX) injection  40 mg Subcutaneous Q24H  . feeding supplement (GLUCERNA SHAKE)  237 mL Oral BID BM  . lisinopril  20 mg Oral Daily   And  . hydrochlorothiazide  25 mg Oral Daily  . insulin aspart  0-15 Units Subcutaneous TID WC  . insulin aspart  0-5 Units Subcutaneous QHS  . insulin glargine  20 Units Subcutaneous QHS  . levothyroxine  25 mcg Oral QAC breakfast  . mouth rinse  15 mL Mouth Rinse BID  . metoprolol tartrate  100 mg Oral  BID  . prednisoLONE acetate  1 drop Both Eyes QID  . sodium chloride flush  3 mL Intravenous Q12H   Continuous Infusions: . sodium chloride 50 mL/hr at 03/17/18 1308  . sodium  chloride     PRN Meds: sodium chloride, acetaminophen, ipratropium-albuterol, ondansetron (ZOFRAN) IV, sodium chloride flush  Subjective:  No complaints overnight. Denies SOB. Tolerated bipap mask overnight but finds it uncomfortable. No chest pain currently. Awaiting cath. Large violaceous area along lateral aspect of right scalp that he notes has gotten larger over time. Started out as one spot that would get irritated by his hard hat years ago, and has grown over the years. Denies any pain.   Objective: Vital Signs Temp:  [97.5 F (36.4 C)-98.1 F (36.7 C)] 97.9 F (36.6 C) (10/23 1326) Pulse Rate:  [53-121] 56 (10/23 1326) Resp:  [12-103] 16 (10/23 1326) BP: (102-154)/(49-69) 129/65 (10/23 1326) SpO2:  [91 %-100 %] 97 % (10/23 1326) Weight:  [89 kg] 89 kg (10/23 0533)  Intake/Output 10/22 0701 - 10/23 0700 In: 420.9 [P.O.:360; I.V.:60.9] Out: 1100 [Urine:1100]   Physical Exam:  Gen: NAD, alert, non-toxic, well-appearing, sitting comfortably, in good spirtis with wife at bedside Skin: Warm and dry. Large well-demarcated violet colored patch with scattered nodules located on right temporal scalp behind right ear (see media), soft to palpation, nontender HEENT: NCAT No conjunctival pallor or injection. No scleral icterus or injection.  MMM.  CV: RRR. <2s capillary refill bilaterally.  RP & DPs 2+ bilaterally. Trace b/l LE edema Resp: CTAB.  No wheezing, rales, abnormal lung sounds.  No increased WOB Abd: NTND on palpation to all 4 quadrants.  Positive bowel sounds. Psych: Cooperative with exam. Pleasant. Makes eye contact. Speech normal. Extremities: Moves all extremities spontaneously  Neuro: A&Ox3     Laboratory: Recent Labs  Lab 03/13/18 0057  WBC 10.3  HGB 16.9  HCT 51.3  PLT 200   Recent Labs  Lab 03/13/18 0057  03/15/18 0307 03/16/18 0348 03/17/18 0320  NA 132*   < > 138 136 136  K 4.2   < > 3.6 5.0 4.2  CL 91*   < > 90* 88* 95*  CO2 32   < > 41* 42* 32    BUN 11   < > 31* 37* 24*  CREATININE 0.89   < > 1.23 1.27* 0.96  CALCIUM 10.2   < > 10.4* 10.6* 10.1  PROT 6.0*  --   --   --   --   BILITOT 1.7*  --   --   --   --   ALKPHOS 86  --   --   --   --   ALT 11  --   --   --   --   AST 26  --   --   --   --   GLUCOSE 118*   < > 141* 240* 181*   < > = values in this interval not displayed.   Imaging/Diagnostic Tests: Dg Chest 2 View  Result Date: 03/13/2018 CLINICAL DATA:  72 y/o  M; shortness of breath. EXAM: CHEST - 2 VIEW COMPARISON:  None. FINDINGS: Mildly enlarged cardiac silhouette given projection and technique. Calcified pleural plaques. Bilateral pleural thickening versus small pleural effusions. Patchy central and basilar airspace opacities. Bones are unremarkable. IMPRESSION: 1. Patchy central and basilar airspace opacities, possibly chronic lung disease or pneumonia. 2. Calcified pleural plaques. Bilateral pleural thickening versus small pleural effusions. 3. Mildly enlarged cardiac silhouette. Electronically  Signed   By: Mitzi Hansen M.D.   On: 03/13/2018 01:51   Ct Chest Wo Contrast  Result Date: 03/13/2018 CLINICAL DATA:  Shortness of breath. EXAM: CT CHEST WITHOUT CONTRAST TECHNIQUE: Multidetector CT imaging of the chest was performed following the standard protocol without IV contrast. COMPARISON:  Radiographs of same day. FINDINGS: Cardiovascular: Atherosclerosis of thoracic aorta is noted without aneurysm formation. Normal cardiac size. Mild coronary artery calcifications are noted. No pericardial effusion is noted. Mediastinum/Nodes: Thyroid gland and esophagus are unremarkable. Calcified lymph nodes are noted in the mediastinum most consistent with prior granulomatous disease. Lungs/Pleura: No pneumothorax is noted. Calcified pleural plaques are noted bilaterally consistent with asbestos exposure. Mild bibasilar subsegmental atelectasis or scarring is noted. Significant pleural thickening is noted in medial portion of  right lower lobe which may represent scarring, but possible mass cannot be excluded. Upper Abdomen: No acute abnormality. Musculoskeletal: No chest wall mass or suspicious bone lesions identified. IMPRESSION: Calcified pleural plaques are noted bilaterally consistent with asbestos exposure. Significant pleural thickening is noted along the medial portion of right lower lobe which most likely represent scarring, but possible neoplasm cannot be excluded. Follow-up CT scan of the chest in 3 months is recommended to ensure stability. Calcified mediastinal lymph nodes are noted most consistent with prior granulomatous disease. Mild bibasilar subsegmental atelectasis or scarring is noted. Aortic aneurysm NOS (ICD10-I71.9). Electronically Signed   By: Lupita Raider, M.D.   On: 03/13/2018 15:37   Nm Myocar Multi W/spect W/wall Motion / Ef  Result Date: 03/16/2018 CLINICAL DATA:  Acute coronary syndrome. Hypertension, diabetes, previous tobacco abuse, shortness of breath EXAM: MYOCARDIAL IMAGING WITH SPECT (REST AND PHARMACOLOGIC-STRESS) GATED LEFT VENTRICULAR WALL MOTION STUDY LEFT VENTRICULAR EJECTION FRACTION TECHNIQUE: Standard myocardial SPECT imaging was performed after resting intravenous injection of 10 mCi Tc-25m tetrofosmin. Subsequently, intravenous infusion of Lexiscan was performed under the supervision of the Cardiology staff. At peak effect of the drug, 30 mCi Tc-4m tetrofosmin was injected intravenously and standard myocardial SPECT imaging was performed. Quantitative gated imaging was also performed to evaluate left ventricular wall motion, and estimate left ventricular ejection fraction. COMPARISON:  None. FINDINGS: Perfusion: Small area of mildly decreased activity in the anteroapical/apicoseptal region of the left ventricular myocardium on the stress study. This is partially improved on the resting study. Otherwise physiologic distribution of radiopharmaceutical. Wall Motion: Mild hypokinesis.  No  left ventricular dilatation. Left Ventricular Ejection Fraction: 47 % End diastolic volume 99 ml End systolic volume 52 ml IMPRESSION: 1. Small region of anteroapical ischemia.  No definite infarct. 2. Mild left ventricular hypokinesis without LVE. 3. Left ventricular ejection fraction 47% 4. Non invasive risk stratification*: Intermediate *2012 Appropriate Use Criteria for Coronary Revascularization Focused Update: J Am Coll Cardiol. 2012;59(9):857-881. http://content.dementiazones.com.aspx?articleid=[email or web address] Electronically Signed   By: Corlis Leak M.D.   On: 03/16/2018 14:04    Echo 03/13/18 Study Conclusions  - Left ventricle: The cavity size was normal. Wall thickness was   normal. Systolic function was normal. The estimated ejection   fraction was in the range of 60% to 65%. Wall motion was normal;   there were no regional wall motion abnormalities. Doppler   parameters are consistent with abnormal left ventricular   relaxation (grade 1 diastolic dysfunction). - Ventricular septum: D-shaped interventricular septum suggestive   of RV pressure/volume overload. - Aortic valve: There was no stenosis. - Aorta: Mildly dilated aortic root. Aortic root dimension: 41 mm   (ED). - Mitral valve: Mildly calcified annulus. There was no  significant   regurgitation. - Right ventricle: The cavity size was mildly dilated. Systolic   function was mildly reduced. - Right atrium: The atrium was mildly dilated. - Pulmonary arteries: No complete TR doppler jet so unable to   estimate PA systolic pressure. - Pericardium, extracardiac: A trivial pericardial effusion was   identified.  Impressions:  - Normal LV size with EF 60-65%. D-shaped interventricular septum   suggestive of RV pressure/volume overload. Mildly dilated RV with   mildly decreased systolic function. No significant valvular   abnormalities. Consider PE in differential.   Orpah Cobb, DO PGY-1, [facility] 03/17/2018 3:28 PM FPTS Intern pager: [phone], text pages welcome

## 2018-03-16 NOTE — Consult Note (Signed)
Ref: Patient, No Pcp Per   Subjective:  VS stable. T max 99.2 degree F. Monitor-SR + RBBB. NM myocardial perfusion test shows mild reversible ischemia in anterior apical area with decreased LVEF.  Objective:  Vital Signs in the last 24 hours: Temp:  [97.6 F (36.4 C)-99.2 F (37.3 C)] 97.6 F (36.4 C) (10/22 0808) Pulse Rate:  [48-76] 66 (10/22 1053) Cardiac Rhythm: Normal sinus rhythm;Heart block (10/22 0700) Resp:  [15-27] 27 (10/22 1053) BP: (129-157)/(49-97) 140/62 (10/22 1053) SpO2:  [90 %-100 %] 96 % (10/22 1053) Weight:  [90.7 kg] 90.7 kg (10/22 5409)  Physical Exam: BP Readings from Last 1 Encounters:  03/16/18 140/62     Wt Readings from Last 1 Encounters:  03/16/18 90.7 kg    Weight change: 2.495 kg Body mass index is 25.67 kg/m. HEENT: [place]/AT, Eyes-Blue, PERL, EOMI, Conjunctiva-Pink, Sclera-Non-icteric Neck: No JVD, No bruit, Trachea midline. Lungs:  Clearing, Bilateral. Cardiac:  Regular rhythm, normal S1 and S2, no S3. II/VI systolic murmur. Abdomen:  Soft, non-tender. BS present. Extremities:  No edema present. No cyanosis. No clubbing. 2 + femoral pulses, radial pulses and posterior tibial pulses.  CNS: AxOx3, Cranial nerves grossly intact, moves all 4 extremities.  Skin: Warm and dry.   Intake/Output from previous day: 10/21 0701 - 10/22 0700 In: 360 [P.O.:360] Out: 1325 [Urine:1325]    Lab Results: BMET    Component Value Date/Time   NA 136 03/16/2018 0348   NA 138 03/15/2018 0307   NA 140 03/14/2018 0403   K 5.0 03/16/2018 0348   K 3.6 03/15/2018 0307   K 3.6 03/14/2018 0403   CL 88 (L) 03/16/2018 0348   CL 90 (L) 03/15/2018 0307   CL 89 (L) 03/14/2018 0403   CO2 42 (H) 03/16/2018 0348   CO2 41 (H) 03/15/2018 0307   CO2 44 (H) 03/14/2018 0403   GLUCOSE 240 (H) 03/16/2018 0348   GLUCOSE 141 (H) 03/15/2018 0307   GLUCOSE 124 (H) 03/14/2018 0403   BUN 37 (H) 03/16/2018 0348   BUN 31 (H) 03/15/2018 0307   BUN 13 03/14/2018 0403    CREATININE 1.27 (H) 03/16/2018 0348   CREATININE 1.23 03/15/2018 0307   CREATININE 1.15 03/14/2018 0403   CALCIUM 10.6 (H) 03/16/2018 0348   CALCIUM 10.4 (H) 03/15/2018 0307   CALCIUM 10.5 (H) 03/14/2018 0403   GFRNONAA 55 (L) 03/16/2018 0348   GFRNONAA 57 (L) 03/15/2018 0307   GFRNONAA >60 03/14/2018 0403   GFRAA >60 03/16/2018 0348   GFRAA >60 03/15/2018 0307   GFRAA >60 03/14/2018 0403   CBC    Component Value Date/Time   WBC 10.3 03/13/2018 0057   RBC 5.71 03/13/2018 0057   HGB 16.9 03/13/2018 0057   HCT 51.3 03/13/2018 0057   PLT 200 03/13/2018 0057   MCV 89.8 03/13/2018 0057   MCH 29.6 03/13/2018 0057   MCHC 32.9 03/13/2018 0057   RDW 12.7 03/13/2018 0057   LYMPHSABS 1.5 03/13/2018 0057   MONOABS 0.8 03/13/2018 0057   EOSABS 0.2 03/13/2018 0057   BASOSABS 0.0 03/13/2018 0057   HEPATIC Function Panel Recent Labs    03/13/18 0057  PROT 6.0*   HEMOGLOBIN A1C No components found for: HGA1C,  MPG CARDIAC ENZYMES Lab Results  Component Value Date   TROPONINI 0.03 (HH) 03/13/2018   TROPONINI <0.03 03/13/2018   TROPONINI 0.03 (HH) 03/13/2018   BNP No results for input(s): PROBNP in the last 8760 hours. TSH Recent Labs    03/13/18 0618  TSH 3.792   CHOLESTEROL No results for input(s): CHOL in the last 8760 hours.  Scheduled Meds: . atorvastatin  40 mg Oral q1800  . enoxaparin (LOVENOX) injection  40 mg Subcutaneous Q24H  . feeding supplement (GLUCERNA SHAKE)  237 mL Oral BID BM  . lisinopril  20 mg Oral Daily   And  . hydrochlorothiazide  25 mg Oral Daily  . insulin aspart  0-15 Units Subcutaneous TID WC  . insulin aspart  0-5 Units Subcutaneous QHS  . insulin glargine  20 Units Subcutaneous QHS  . levothyroxine  25 mcg Oral QAC breakfast  . mouth rinse  15 mL Mouth Rinse BID  . metoprolol tartrate  100 mg Oral BID  . prednisoLONE acetate  1 drop Both Eyes QID  . regadenoson       Continuous Infusions: PRN  Meds:.ipratropium-albuterol  Assessment/Plan: Shortness of breath Asbestosis Acute respiratory failure with hypercarbia Mild RV systolic failure RBBB + right fascicular block Type 2 DM, Hypertension Hyperlipidemia Abnormal nuclear stress test r/o CAD CKD, II  R + L heart cath in AM Small dose IV saline to improve renal function.   LOS: 2 days    Orpah Cobb  MD  03/16/2018, 4:44 PM

## 2018-03-16 NOTE — Progress Notes (Signed)
Inpatient Diabetes Program Recommendations  AACE/ADA: New Consensus Statement on Inpatient Glycemic Control (2019)  Target Ranges:  Prepandial:   less than 140 mg/dL      Peak postprandial:   less than 180 mg/dL (1-2 hours)      Critically ill patients:  140 - 180 mg/dL   Results for VASILIS, LUHMAN (MRN [id number]) as of 03/16/2018 10:40  Ref. Range 03/15/2018 06:06 03/15/2018 11:46 03/15/2018 17:08 03/15/2018 21:34 03/16/2018 06:18  Glucose-Capillary Latest Ref Range: 70 - 99 mg/dL 657 (H) 846 (H) 962 (H) 277 (H) 226 (H)   Review of Glycemic Control  Current orders for Inpatient glycemic control: Lantus 20 units QHS, Novolog 0-15 units TID with meals, Novolog 0-5 units QHS  Inpatient Diabetes Program Recommendations:  Insulin - Basal: Please consider increasing Lantus to 22 units QHS. Insulin - Meal Coverage: When diet resumed, please consider ordering Novolog 3 units TID with meals for meal coverage if patient eats at least 50% of meals.  Thanks, Orlando Penner, RN, MSN, CDE Diabetes Coordinator Inpatient Diabetes Program [phone] (Team Pager from 8am to 5pm)

## 2018-03-16 NOTE — Progress Notes (Signed)
PULMONARY / CRITICAL CARE MEDICINE   NAME:  Robert Ramirez, MRN:  [id number], DOB:  [birth year], LOS: 2 ADMISSION DATE:  03/13/2018, CONSULTATION DATE: 03/14/2018 REFERRING MD: Dr. Jennette Kettle, CHIEF COMPLAINT: Shortness of breath  BRIEF HISTORY:    Patient presented with worsening shortness of breath, lower extremity edema, not acting his usual according to family member. Found to have hypercarbic failure and volume overload. Improved with BiPAP and typical therapies. Pleural plaques discovered on CT. PCCM consult.    SIGNIFICANT PAST MEDICAL HISTORY   Progressive shortness of breath led to recent presentation History of diabetes History of hypertension No diagnosis of lung disease in the past  SIGNIFICANT EVENTS:   STUDIES:    CULTURES:  Blood cultures on 03/13/2018-  ANTIBIOTICS:  Azithromycin on 03/13/2018 Rocephin on 03/13/2018  LINES/TUBES:   CONSULTANTS:   SUBJECTIVE:  Feeling even better today. Wants to go home.   CONSTITUTIONAL: BP 140/62 Comment: test finished  Pulse 66   Temp 97.6 F (36.4 C) (Oral)   Resp (!) 27   Ht 6\' 2"  (1.88 m)   Wt 90.7 kg   SpO2 96%   BMI 25.67 kg/m   I/O last 3 completed shifts: In: 360 [P.O.:360] Out: 1625 [Urine:1625]  PHYSICAL EXAM: General:  Elderly male in NAD Neuro:  Alert, oriented, non-focal HEENT:  [place]/AT, No JVD noted, PERRL Cardiovascular:  RRR, no MRG Lungs:  Clear, bilateral breath sounds Abdomen:  Soft, non-distended Musculoskeletal:  No acute deformity Skin:  Intact, MMM  RESOLVED PROBLEM LIST   ASSESSMENT AND PLAN   Acute hypercapnic respiratory failure: improved. Off BiPAP since yesterday. His symptoms sound more closely related to diastolic CHF/pulmonary hypertension.  - Responded well to lasix. Cardiology now following. - Ambulatory desaturation assessment prior to discharge to assess home O2 needs.   Chronic hypercarbic respiratory failure: not well defined. Baseline Bicarb in the 40's. He doesn't seem to  have any underlying etiology that would predispose him for this. - Pulmonary follow up and PFTs as an outpatient.  (I have arranged appointment for 11/5 at 9am) - Will need sleep study as well.  - QHS BiPAP and discharge on same. Order placed for home BiPAP.    Suspect pulmonary hypertension even though the pressures could not be adequately estimated on the current echocardiogram, presence of atrial dilatation and reduced right systolic pressures May also have a component of right heart failure contributing to current presentation. Has responded well to diuresis - Diurese as tolerated. 2L negative so far.  - Cardiology following, underwent stress test this AM.  Pleural plaques -Contribute to his restrictive physiology, worked with insulation in the past. Doesn't seem significant enough to cause a chronic hypercarbic failure. -Progressive disease, repeat CT in 3 months to track progression.   Diabetes - Per primary  Hypertension - Per primary   SUMMARY OF TODAY'S PLAN:   Continue supplemental O2 and QHS BiPAP while cardiac workup pending.   Have arranged outpatient follow-up in pulmonary clinic.   Have placed orders for home BiPAP  Best Practice / Goals of Care / Disposition.   DVT PROPHYLAXIS: On Lovenox NUTRITION: As tolerated MOBILITY: As tolerated FAMILY DISCUSSIONS: Patient updated.  LABS  Glucose Recent Labs  Lab 03/14/18 2130 03/15/18 0606 03/15/18 1146 03/15/18 1708 03/15/18 2134 03/16/18 0618  GLUCAP 247* 138* 212* 343* 277* 226*    BMET Recent Labs  Lab 03/14/18 0403 03/15/18 0307 03/16/18 0348  NA 140 138 136  K 3.6 3.6 5.0  CL 89* 90* 88*  CO2  44* 41* 42*  BUN 13 31* 37*  CREATININE 1.15 1.23 1.27*  GLUCOSE 124* 141* 240*    Liver Enzymes Recent Labs  Lab 03/13/18 0057  AST 26  ALT 11  ALKPHOS 86  BILITOT 1.7*  ALBUMIN 3.3*    Electrolytes Recent Labs  Lab 03/14/18 0403 03/15/18 0307 03/16/18 0348  CALCIUM 10.5* 10.4* 10.6*     CBC Recent Labs  Lab 03/13/18 0057  WBC 10.3  HGB 16.9  HCT 51.3  PLT 200    ABG Recent Labs  Lab 03/14/18 1333 03/14/18 1745 03/14/18 2109  PHART 7.338* 7.418 7.433  PCO2ART 85.6* 68.2* 66.0*  PO2ART 90.8 98.4 74.2*    Coag's No results for input(s): APTT, INR in the last 168 hours.  Sepsis Markers Recent Labs  Lab 03/13/18 0454  LATICACIDVEN 0.65    Cardiac Enzymes Recent Labs  Lab 03/13/18 0618 03/13/18 1110 03/13/18 1702  TROPONINI 0.03* <0.03 0.03*     Joneen Roach, AGACNP-BC [facility] Pulmonary/Critical Care Pager [phone] or [phone]  03/16/2018 12:04 PM

## 2018-03-16 NOTE — Progress Notes (Signed)
Family Medicine Teaching Service Daily Progress Note Intern Pager: [phone]  Patient name: Robert Ramirez Medical record number: [id number] Date of birth: 05-10-1946 Age: 72 y.o. Gender: male  Primary Care Provider: Patient, No Pcp Per Consultants: Pulmonology Code Status: Full code  Pt Overview and Major Events to Date:  03/13/2018 Admitted 03/13/2018 Stepdown for BiPAP Hospital Day: 4   Assessment and Plan: Araf Clugston a 72 y.o.malepresenting with several days of SOB. PMH is significant forT2DM, HTN, and HLD.  Hypercapnic Respiratory Failure likely 2/2 Asbestos:  Hypoxia likely 2/2 to history of occupational asbestos exposure and chronic CO2 retention. R heart failure likely due to lung disease. -pulm following, appreciate recs: will trial bipap on night and attempt to d/c patient with this, outpatient PFTs -cards following- plan for nuc stress test this am with potential R/L heart cath -care mgmt c/s to get patient bipap -continuous PO2, [place] as needed and every night bipap  -daily wts, I/os -outpt chest CT 3 months  Hyponatremia: resolved  - monitor on daily labs  ZHY:QMVHQIO, improving, this am 129/60 - Continue home Lisinopril-HCTZ, and metoprolol   HLD: chronic Lipid panel 01/19/2015: LDL 119, Triglycerides 191 -ContinueAtorvastatin 40mg  QD  T2DM: chronic, uncontrolled. A1C 10.2. Per chart review, patient is unable to aford Lantus which is why he is on NPH.  - Continue Lantus 20U qHS - mSSI  - CBGs AC/HS - Care management consulted for help affording medications/bipap  Fluids: Saline lock Electrolytes: Replace PRN Nutrition: Carb modified diet GI ppx: none DVT ppx: Lovenox  Disposition: Home pending improvement   Medications: Scheduled Meds: . aspirin  324 mg Oral Once  . atorvastatin  40 mg Oral q1800  . enoxaparin (LOVENOX) injection  40 mg Subcutaneous Q24H  . feeding supplement (GLUCERNA SHAKE)  237 mL Oral BID BM  . lisinopril  20 mg  Oral Daily   And  . hydrochlorothiazide  25 mg Oral Daily  . insulin aspart  0-15 Units Subcutaneous TID WC  . insulin aspart  0-5 Units Subcutaneous QHS  . insulin glargine  20 Units Subcutaneous QHS  . levothyroxine  25 mcg Oral QAC breakfast  . mouth rinse  15 mL Mouth Rinse BID  . metoprolol tartrate  100 mg Oral BID  . prednisoLONE acetate  1 drop Both Eyes QID   Continuous Infusions: PRN Meds: ipratropium-albuterol  Subjective:  Feeling well, reports breathing is "the same" since he came in. Tolerated bipap mask overnight but finds it uncomfortable. No chest pain currently. Awaiting stress test.   Objective: Vital Signs Temp:  [96.8 F (36 C)-99.2 F (37.3 C)] 97.6 F (36.4 C) (10/22 0808) Pulse Rate:  [59-76] 59 (10/22 0303) Resp:  [15-20] 15 (10/22 0303) BP: (107-156)/(60-78) 156/78 (10/22 0808) SpO2:  [93 %-100 %] 100 % (10/22 0303) Weight:  [90.7 kg] 90.7 kg (10/22 0621)  Intake/Output 10/21 0701 - 10/22 0700 In: 360 [P.O.:360] Out: 1325 [Urine:1325]  Physical Exam:   Gen: pleasant, Joes in place, in NAD Heart: RRR no MRG Lungs: CTA bilaterally, no increased work of breathing Abdomen: soft, non-tender, non-distended, +BS Extremities: no edema or cyanosis Neuro: no focal deficits   Laboratory: Recent Labs  Lab 03/13/18 0057  WBC 10.3  HGB 16.9  HCT 51.3  PLT 200   Recent Labs  Lab 03/13/18 0057 03/14/18 0403 03/15/18 0307 03/16/18 0348  NA 132* 140 138 136  K 4.2 3.6 3.6 5.0  CL 91* 89* 90* 88*  CO2 32 44* 41* 42*  BUN 11 13  31* 37*  CREATININE 0.89 1.15 1.23 1.27*  CALCIUM 10.2 10.5* 10.4* 10.6*  PROT 6.0*  --   --   --   BILITOT 1.7*  --   --   --   ALKPHOS 86  --   --   --   ALT 11  --   --   --   AST 26  --   --   --   GLUCOSE 118* 124* 141* 240*   Imaging/Diagnostic Tests:   Echo 03/13/18 Study Conclusions  - Left ventricle: The cavity size was normal. Wall thickness was   normal. Systolic function was normal. The estimated  ejection   fraction was in the range of 60% to 65%. Wall motion was normal;   there were no regional wall motion abnormalities. Doppler   parameters are consistent with abnormal left ventricular   relaxation (grade 1 diastolic dysfunction). - Ventricular septum: D-shaped interventricular septum suggestive   of RV pressure/volume overload. - Aortic valve: There was no stenosis. - Aorta: Mildly dilated aortic root. Aortic root dimension: 41 mm   (ED). - Mitral valve: Mildly calcified annulus. There was no significant   regurgitation. - Right ventricle: The cavity size was mildly dilated. Systolic   function was mildly reduced. - Right atrium: The atrium was mildly dilated. - Pulmonary arteries: No complete TR doppler jet so unable to   estimate PA systolic pressure. - Pericardium, extracardiac: A trivial pericardial effusion was   identified.  Impressions:  - Normal LV size with EF 60-65%. D-shaped interventricular septum   suggestive of RV pressure/volume overload. Mildly dilated RV with   mildly decreased systolic function. No significant valvular   abnormalities. Consider PE in differential.   [name], DO PGY-3, [facility] 03/16/2018 9:56 AM  FPTS Intern pager: [phone], text pages welcome

## 2018-03-17 ENCOUNTER — Ambulatory Visit (HOSPITAL_COMMUNITY): Admission: RE | Admit: 2018-03-17 | Payer: Medicare HMO | Source: Ambulatory Visit | Admitting: Cardiovascular Disease

## 2018-03-17 ENCOUNTER — Encounter (HOSPITAL_COMMUNITY): Payer: Self-pay | Admitting: Cardiovascular Disease

## 2018-03-17 ENCOUNTER — Encounter (HOSPITAL_COMMUNITY)
Admission: EM | Disposition: A | Discharge status: Home / Self Care | Payer: Self-pay | Source: Home / Self Care | Attending: Family Medicine

## 2018-03-17 HISTORY — PX: RIGHT/LEFT HEART CATH AND CORONARY ANGIOGRAPHY: CATH118266

## 2018-03-17 LAB — POCT I-STAT 3, VENOUS BLOOD GAS (G3P V)
Acid-Base Excess: 11 mmol/L — ABNORMAL HIGH (ref 0.0–2.0)
Bicarbonate: 42 mmol/L — ABNORMAL HIGH (ref 20.0–28.0)
O2 Saturation: 68 %
PCO2 VEN: 84.2 mmHg — AB (ref 44.0–60.0)
PH VEN: 7.306 (ref 7.250–7.430)
PO2 VEN: 41 mmHg (ref 32.0–45.0)
TCO2: 45 mmol/L — ABNORMAL HIGH (ref 22–32)

## 2018-03-17 LAB — GLUCOSE, CAPILLARY
GLUCOSE-CAPILLARY: 167 mg/dL — AB (ref 70–99)
GLUCOSE-CAPILLARY: 110 mg/dL — AB (ref 70–99)
Glucose-Capillary: 108 mg/dL — ABNORMAL HIGH (ref 70–99)
Glucose-Capillary: 254 mg/dL — ABNORMAL HIGH (ref 70–99)

## 2018-03-17 LAB — BASIC METABOLIC PANEL
Anion gap: 9 (ref 5–15)
BUN: 24 mg/dL — AB (ref 8–23)
CHLORIDE: 95 mmol/L — AB (ref 98–111)
CO2: 32 mmol/L (ref 22–32)
CREATININE: 0.96 mg/dL (ref 0.61–1.24)
Calcium: 10.1 mg/dL (ref 8.9–10.3)
GFR calc Af Amer: >60 mL/min (ref >60)
GFR calc non Af Amer: >60 mL/min (ref >60)
GLUCOSE: 181 mg/dL — AB (ref 70–99)
Potassium: 4.2 mmol/L (ref 3.5–5.1)
Sodium: 136 mmol/L (ref 135–145)

## 2018-03-17 LAB — POCT I-STAT 3, ART BLOOD GAS (G3+)
Acid-Base Excess: 11 mmol/L — ABNORMAL HIGH (ref 0.0–2.0)
BICARBONATE: 41.8 mmol/L — AB (ref 20.0–28.0)
O2 SAT: 96 %
PH ART: 7.323 — AB (ref 7.350–7.450)
TCO2: 44 mmol/L — AB (ref 22–32)
pCO2 arterial: 80.5 mmHg (ref 32.0–48.0)
pO2, Arterial: 96 mmHg (ref 83.0–108.0)

## 2018-03-17 SURGERY — RIGHT/LEFT HEART CATH AND CORONARY ANGIOGRAPHY
Anesthesia: LOCAL

## 2018-03-17 MED ORDER — FENTANYL CITRATE (PF) 100 MCG/2ML IJ SOLN
INTRAMUSCULAR | Status: AC
  Filled 2018-03-17: qty 2

## 2018-03-17 MED ORDER — SODIUM CHLORIDE 0.9 % IV SOLN: INTRAVENOUS | Status: AC

## 2018-03-17 MED ORDER — MIDAZOLAM HCL 2 MG/2ML IJ SOLN
INTRAMUSCULAR | Status: AC
  Filled 2018-03-17: qty 2

## 2018-03-17 MED ORDER — FENTANYL CITRATE (PF) 100 MCG/2ML IJ SOLN
INTRAMUSCULAR | Status: DC | PRN
  Administered 2018-03-17: 25 ug via INTRAVENOUS

## 2018-03-17 MED ORDER — SODIUM CHLORIDE 0.9% FLUSH
3.0000 mL | Freq: Two times a day (BID) | INTRAVENOUS | Status: DC
  Administered 2018-03-18 – 2018-03-20 (×5): 3 mL via INTRAVENOUS

## 2018-03-17 MED ORDER — IOHEXOL 350 MG/ML SOLN
INTRAVENOUS | Status: DC | PRN
  Administered 2018-03-17: 40 mL via INTRA_ARTERIAL

## 2018-03-17 MED ORDER — HEPARIN (PORCINE) IN NACL 1000-0.9 UT/500ML-% IV SOLN
INTRAVENOUS | Status: AC
  Filled 2018-03-17: qty 500

## 2018-03-17 MED ORDER — ONDANSETRON HCL 4 MG/2ML IJ SOLN
4.0000 mg | Freq: Four times a day (QID) | INTRAMUSCULAR | Status: DC | PRN
  Administered 2018-03-18 (×2): 4 mg via INTRAVENOUS
  Filled 2018-03-17 (×2): qty 2

## 2018-03-17 MED ORDER — SODIUM CHLORIDE 0.9 % IV SOLN: 250.0000 mL | INTRAVENOUS | Status: DC | PRN

## 2018-03-17 MED ORDER — SODIUM CHLORIDE 0.9% FLUSH
3.0000 mL | INTRAVENOUS | Status: DC | PRN
  Administered 2018-03-17: 3 mL via INTRAVENOUS
  Filled 2018-03-17: qty 3

## 2018-03-17 MED ORDER — LIDOCAINE HCL (PF) 1 % IJ SOLN
INTRAMUSCULAR | Status: DC | PRN
  Administered 2018-03-17: 15 mL

## 2018-03-17 MED ORDER — ACETAMINOPHEN 325 MG PO TABS: 650.0000 mg | ORAL_TABLET | ORAL | Status: DC | PRN

## 2018-03-17 MED ORDER — MIDAZOLAM HCL 2 MG/2ML IJ SOLN
INTRAMUSCULAR | Status: DC | PRN
  Administered 2018-03-17: 1 mg via INTRAVENOUS

## 2018-03-17 MED ORDER — HEPARIN (PORCINE) IN NACL 1000-0.9 UT/500ML-% IV SOLN
INTRAVENOUS | Status: DC | PRN
  Administered 2018-03-17 (×2): 500 mL

## 2018-03-17 MED ORDER — LIDOCAINE HCL (PF) 1 % IJ SOLN
INTRAMUSCULAR | Status: AC
  Filled 2018-03-17: qty 30

## 2018-03-17 SURGICAL SUPPLY — 10 items
CATH INFINITI 5FR MULTPACK ANG (CATHETERS) ×2 IMPLANT
CATH SWAN GANZ 7F STRAIGHT (CATHETERS) ×2 IMPLANT
GUIDEWIRE .025 260CM (WIRE) ×2 IMPLANT
KIT HEART LEFT (KITS) ×2 IMPLANT
PACK CARDIAC CATHETERIZATION (CUSTOM PROCEDURE TRAY) ×2 IMPLANT
SHEATH PINNACLE 5F 10CM (SHEATH) ×2 IMPLANT
SHEATH PINNACLE 7F 10CM (SHEATH) ×4 IMPLANT
TRANSDUCER W/STOPCOCK (MISCELLANEOUS) ×2 IMPLANT
WIRE EMERALD 3MM-J .025X260CM (WIRE) ×2 IMPLANT
WIRE EMERALD 3MM-J .035X150CM (WIRE) ×2 IMPLANT

## 2018-03-17 NOTE — Progress Notes (Signed)
Site area: 2 rt fa sheaths and 1 fv sheath Site Prior to Removal:  Level 0 Pressure Applied For:  20 minutes Manual:   yes Patient Status During Pull:  stable Post Pull Site:  Level  0 Post Pull Instructions Given:  yes Post Pull Pulses Present: rt dp palpable Dressing Applied:  Gauze and tegaderm Bedrest begins @ 1305 Comments:

## 2018-03-17 NOTE — Progress Notes (Signed)
PULMONARY / CRITICAL CARE MEDICINE   NAME:  Robert Ramirez, MRN:  [id number], DOB:  1946-04-23, LOS: 3 ADMISSION DATE:  03/13/2018, CONSULTATION DATE: 03/14/2018 REFERRING MD: Dr. Jennette Kettle, CHIEF COMPLAINT: Shortness of breath  BRIEF HISTORY:    Patient presented with worsening shortness of breath, lower extremity edema, not acting his usual according to family member. Found to have hypercarbic failure and volume overload. Improved with BiPAP and typical therapies. Pleural plaques discovered on CT. PCCM consult. Cause of hypercarbia is not clear but he has tolerated BiPAP well and would suggest that we discharge him on BiPAP 10/5 and schedule outpatient sleep study   CT chest shows bilateral small effusions and pleural plaques and calcified lymphadenopathy suggestive of old granulomatous disease. 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 He quit smoking more than 35 years ago and smoked less than 10 pack years. Echo surprisingly shows D-shaped septum and RV dilatation suggestive of severe pulmonary hypertension and cor pulmonale.  SIGNIFICANT PAST MEDICAL HISTORY   Progressive shortness of breath led to recent presentation History of diabetes History of hypertension No diagnosis of lung disease in the past   reports that he has quit smoking. He has never used smokeless tobacco.   SIGNIFICANT EVENTS:   STUDIES:    CULTURES:  Blood cultures on 03/13/2018-  ANTIBIOTICS:  Azithromycin on 03/13/2018 Rocephin on 03/13/2018  LINES/TUBES:   CONSULTANTS:   SUBJECTIVE:   03/17/2018 - s/p Cath Non obst CAD 60% RCA. Nrmal LVEDP. Mildyly elevated PASP 46/16  and RV pressure but PA mean26  and PCWP normal 8  Pulse ox 88% on RA at rest. 96% on 1L [place] now  CONSTITUTIONAL: BP (!) 130/57 (BP Location: Left Arm)   Pulse 65   Temp 97.7 F (36.5 C) (Oral)   Resp 20   Ht 6\' 2"  (1.88 m)   Wt 89 kg Comment: Scale B  SpO2 97%   BMI 25.18 kg/m   I/O last  3 completed shifts: In: 420.9 [P.O.:360; I.V.:60.9] Out: 1525 [Urine:1525]  PHYSICAL EXAM: General Appearance:  Looks stable. Overweight Head:  Normocephalic, without obvious abnormality, atraumatic Eyes:  PERRL - yes3, conjunctiva/corneas - clear     Ears:  Normal external ear canals, both ears Nose:  G tube - no Throat:  ETT TUBE - no , OG tube - no Neck:  Supple,  No enlargement/tenderness/nodules Lungs: Clear to auscultation bilaterally,  Heart:  S1 and S2 normal, no murmur, CVP - no.  Pressors - no Abdomen:  Soft, no masses, no organomegaly Genitalia / Rectal:  Not done Extremities:  Extremities- intact with edema Skin:  ntact in exposed areas . Sacral area - no decub3 Neurologic:  Sedation - none -> RASS - +1 . Moves all 4s - yes. CAM-ICU - neg . Orientation - x3+   LABS    PULMONARY Recent Labs  Lab 03/14/18 0900 03/14/18 1333 03/14/18 1745 03/14/18 2109 03/17/18 1210 03/17/18 1224  PHART 7.345* 7.338* 7.418 7.433  --  7.323*  PCO2ART 86.0* 85.6* 68.2* 66.0*  --  80.5*  PO2ART 77.9* 90.8 98.4 74.2*  --  96.0  HCO3 45.7* 44.7* 43.2* 43.5* 42.0* 41.8*  TCO2  --   --   --   --  45* 44*  O2SAT 95.5 97.0 98.3 96.3 68.0 96.0    CBC Recent Labs  Lab 03/13/18 0057  HGB 16.9  HCT 51.3  WBC 10.3  PLT 200    COAGULATION No results for input(s): INR  in the last 168 hours.  CARDIAC   Recent Labs  Lab 03/13/18 0618 03/13/18 1110 03/13/18 1702  TROPONINI 0.03* <0.03 0.03*   No results for input(s): PROBNP in the last 168 hours.   CHEMISTRY Recent Labs  Lab 03/13/18 0057 03/14/18 0403 03/15/18 0307 03/16/18 0348 03/17/18 0320  NA 132* 140 138 136 136  K 4.2 3.6 3.6 5.0 4.2  CL 91* 89* 90* 88* 95*  CO2 32 44* 41* 42* 32  GLUCOSE 118* 124* 141* 240* 181*  BUN 11 13 31* 37* 24*  CREATININE 0.89 1.15 1.23 1.27* 0.96  CALCIUM 10.2 10.5* 10.4* 10.6* 10.1   Estimated Creatinine Clearance: 82.1 mL/min (by C-G formula based on SCr of 0.96  mg/dL).   LIVER Recent Labs  Lab 03/13/18 0057  AST 26  ALT 11  ALKPHOS 86  BILITOT 1.7*  PROT 6.0*  ALBUMIN 3.3*     INFECTIOUS Recent Labs  Lab 03/13/18 0454  LATICACIDVEN 0.65     ENDOCRINE CBG (last 3)  Recent Labs    03/17/18 0639 03/17/18 1246 03/17/18 1538  GLUCAP 167* 108* 110*         IMAGING x48h  - image(s) personally visualized  -   highlighted in bold Nm Myocar Multi W/spect W/wall Motion / Ef  Result Date: 03/16/2018 CLINICAL DATA:  Acute coronary syndrome. Hypertension, diabetes, previous tobacco abuse, shortness of breath EXAM: MYOCARDIAL IMAGING WITH SPECT (REST AND PHARMACOLOGIC-STRESS) GATED LEFT VENTRICULAR WALL MOTION STUDY LEFT VENTRICULAR EJECTION FRACTION TECHNIQUE: Standard myocardial SPECT imaging was performed after resting intravenous injection of 10 mCi Tc-11m tetrofosmin. Subsequently, intravenous infusion of Lexiscan was performed under the supervision of the Cardiology staff. At peak effect of the drug, 30 mCi Tc-94m tetrofosmin was injected intravenously and standard myocardial SPECT imaging was performed. Quantitative gated imaging was also performed to evaluate left ventricular wall motion, and estimate left ventricular ejection fraction. COMPARISON:  None. FINDINGS: Perfusion: Small area of mildly decreased activity in the anteroapical/apicoseptal region of the left ventricular myocardium on the stress study. This is partially improved on the resting study. Otherwise physiologic distribution of radiopharmaceutical. Wall Motion: Mild hypokinesis.  No left ventricular dilatation. Left Ventricular Ejection Fraction: 47 % End diastolic volume 99 ml End systolic volume 52 ml IMPRESSION: 1. Small region of anteroapical ischemia.  No definite infarct. 2. Mild left ventricular hypokinesis without LVE. 3. Left ventricular ejection fraction 47% 4. Non invasive risk stratification*: Intermediate *2012 Appropriate Use Criteria for Coronary  Revascularization Focused Update: J Am Coll Cardiol. 2012;59(9):857-881. http://content.dementiazones.com.aspx?articleid=[email or web address] Electronically Signed   By: Corlis Leak M.D.   On: 03/16/2018 14:04     Recent Results (from the past 240 hour(s))  Culture, blood (routine x 2)     Status: None (Preliminary result)   Collection Time: 03/13/18  4:38 AM  Result Value Ref Range Status   Specimen Description BLOOD RIGHT ANTECUBITAL  Final   Special Requests   Final    BOTTLES DRAWN AEROBIC AND ANAEROBIC Blood Culture adequate volume   Culture   Final    NO GROWTH 4 DAYS Performed at [facility], [address], [place], [place] [address]    Report Status PENDING  Incomplete  Culture, blood (routine x 2)     Status: None (Preliminary result)   Collection Time: 03/13/18  4:50 AM  Result Value Ref Range Status   Specimen Description BLOOD RIGHT WRIST  Final   Special Requests   Final    BOTTLES DRAWN AEROBIC AND  ANAEROBIC Blood Culture results may not be optimal due to an inadequate volume of blood received in culture bottles   Culture   Final    NO GROWTH 4 DAYS Performed at [facility], [address], [place], [place] [address]    Report Status PENDING  Incomplete     RESOLVED PROBLEM LIST   ASSESSMENT AND PLAN    Acute on chronic hypoxemic and hypercapnic resp failur with cor pulmonale and pleural plaquest  - suspect he might have copd +/- ILD asbesosis  Pl;an  - day time 1L [place] - night time bipap - continue at hme  - dc on bipap  - needs HRCT without contrast in due course when more euvolemic  - recheck abg 03/18/18 - cointinue duoneb    SUMMARY OF TODAY'S PLAN:   Ccm will see 03/18/18  Best Practice / Goals of Care / Disposition.   DVT PROPHYLAXIS: On Lovenox NUTRITION: As tolerated MOBILITY: As tolerated FAMILY DISCUSSIONS: Patient updated.   SIGNATURE    Dr. Kalman Shan, M.D., F.C.C.P,  Pulmonary and Critical Care Medicine Staff  Physician, [facility] Center Director - Interstitial Lung Disease  Program  Pulmonary Fibrosis [facility] at [facility] [place], [place], [address]  Pager: [phone], If no answer or between  15:00h - 7:00h: call [phone] Telephone: [phone]  5:39 PM 03/17/2018

## 2018-03-17 NOTE — Consult Note (Addendum)
Ref: Patient, No Pcp Per   Subjective:  Abnormal nuclear stress test. No chest pain. Agrees to right and left heart catheterization in AM.  Objective:  Vital Signs in the last 24 hours: Temp:  [97.5 F (36.4 C)-98.1 F (36.7 C)] 97.5 F (36.4 C) (10/23 0802) Pulse Rate:  [58-69] 63 (10/23 0533) Cardiac Rhythm: Normal sinus rhythm (10/23 0700) Resp:  [16-23] 16 (10/23 0533) BP: (122-154)/(62-69) 122/62 (10/23 0533) SpO2:  [97 %-100 %] 99 % (10/23 1119) Weight:  [89 kg] 89 kg (10/23 0533)  Physical Exam: BP Readings from Last 1 Encounters:  03/17/18 122/62     Wt Readings from Last 1 Encounters:  03/17/18 89 kg    Weight change: -1.724 kg Body mass index is 25.18 kg/m. HEENT: [place]/AT, Eyes-Blue, PERL, EOMI, Conjunctiva-Pink, Sclera-Non-icteric Neck: No JVD, No bruit, Trachea midline. Lungs:  Clearing, Bilateral. Cardiac:  Regular rhythm, normal S1 and S2, no S3. II/VI systolic murmur. Abdomen:  Soft, non-tender. BS present. Extremities:  No edema present. No cyanosis. No clubbing. CNS: AxOx3, Cranial nerves grossly intact, moves all 4 extremities.  Skin: Warm and dry.   Intake/Output from previous day: 10/22 0701 - 10/23 0700 In: 420.9 [P.O.:360; I.V.:60.9] Out: 1100 [Urine:1100]    Lab Results: BMET    Component Value Date/Time   NA 136 03/17/2018 0320   NA 136 03/16/2018 0348   NA 138 03/15/2018 0307   K 4.2 03/17/2018 0320   K 5.0 03/16/2018 0348   K 3.6 03/15/2018 0307   CL 95 (L) 03/17/2018 0320   CL 88 (L) 03/16/2018 0348   CL 90 (L) 03/15/2018 0307   CO2 32 03/17/2018 0320   CO2 42 (H) 03/16/2018 0348   CO2 41 (H) 03/15/2018 0307   GLUCOSE 181 (H) 03/17/2018 0320   GLUCOSE 240 (H) 03/16/2018 0348   GLUCOSE 141 (H) 03/15/2018 0307   BUN 24 (H) 03/17/2018 0320   BUN 37 (H) 03/16/2018 0348   BUN 31 (H) 03/15/2018 0307   CREATININE 0.96 03/17/2018 0320   CREATININE 1.27 (H) 03/16/2018 0348   CREATININE 1.23 03/15/2018 0307   CALCIUM 10.1 03/17/2018  0320   CALCIUM 10.6 (H) 03/16/2018 0348   CALCIUM 10.4 (H) 03/15/2018 0307   GFRNONAA >60 03/17/2018 0320   GFRNONAA 55 (L) 03/16/2018 0348   GFRNONAA 57 (L) 03/15/2018 0307   GFRAA >60 03/17/2018 0320   GFRAA >60 03/16/2018 0348   GFRAA >60 03/15/2018 0307   CBC    Component Value Date/Time   WBC 10.3 03/13/2018 0057   RBC 5.71 03/13/2018 0057   HGB 16.9 03/13/2018 0057   HCT 51.3 03/13/2018 0057   PLT 200 03/13/2018 0057   MCV 89.8 03/13/2018 0057   MCH 29.6 03/13/2018 0057   MCHC 32.9 03/13/2018 0057   RDW 12.7 03/13/2018 0057   LYMPHSABS 1.5 03/13/2018 0057   MONOABS 0.8 03/13/2018 0057   EOSABS 0.2 03/13/2018 0057   BASOSABS 0.0 03/13/2018 0057   HEPATIC Function Panel Recent Labs    03/13/18 0057  PROT 6.0*   HEMOGLOBIN A1C No components found for: HGA1C,  MPG CARDIAC ENZYMES Lab Results  Component Value Date   TROPONINI 0.03 (HH) 03/13/2018   TROPONINI <0.03 03/13/2018   TROPONINI 0.03 (HH) 03/13/2018   BNP No results for input(s): PROBNP in the last 8760 hours. TSH Recent Labs    03/13/18 0618  TSH 3.792   CHOLESTEROL No results for input(s): CHOL in the last 8760 hours.  Scheduled Meds: . [facility] Hold]  atorvastatin  40 mg Oral q1800  . [MAR Hold] enoxaparin (LOVENOX) injection  40 mg Subcutaneous Q24H  . [MAR Hold] feeding supplement (GLUCERNA SHAKE)  237 mL Oral BID BM  . [MAR Hold] lisinopril  20 mg Oral Daily   And  . [MAR Hold] hydrochlorothiazide  25 mg Oral Daily  . [MAR Hold] insulin aspart  0-15 Units Subcutaneous TID WC  . [MAR Hold] insulin aspart  0-5 Units Subcutaneous QHS  . [MAR Hold] insulin glargine  20 Units Subcutaneous QHS  . [MAR Hold] levothyroxine  25 mcg Oral QAC breakfast  . [MAR Hold] mouth rinse  15 mL Mouth Rinse BID  . [MAR Hold] metoprolol tartrate  100 mg Oral BID  . [MAR Hold] prednisoLONE acetate  1 drop Both Eyes QID  . sodium chloride flush  3 mL Intravenous Q12H   Continuous Infusions: . sodium chloride     . sodium chloride 30 mL/hr at 03/16/18 2351   PRN Meds:.sodium chloride, Heparin (Porcine) in NaCl, [MAR Hold] ipratropium-albuterol, sodium chloride flush  Assessment/Plan:  Shortness of breath Acute respiratory failure with hypercarbia Mild RV systolic dysfunction RBBB + right posterior fascicular block Type 2 DM, uncontrolled Hypertension Hyperlipidemia Abnormal Troponin I Abnormal nuclear stress test  R + L heart cath in AM.   LOS: 3 days    Orpah Cobb  MD  03/17/2018, 11:21 AM

## 2018-03-17 NOTE — Progress Notes (Signed)
RT placed patient on BIPAP. Patient tolerating well at this time. RT will monitor as needed. 

## 2018-03-17 NOTE — Progress Notes (Signed)
Patient arrived from the cath lab. He is alert and oriented and vital signs are stable. Right Fem. Site is clean, dry and intact and soft,  covered with gauze. Patient will be on bedrest for 6 hours per provider orders starting at 1305. I will continue to monitor patient.

## 2018-03-18 DIAGNOSIS — E11319 Type 2 diabetes mellitus with unspecified diabetic retinopathy without macular edema: Secondary | ICD-10-CM

## 2018-03-18 DIAGNOSIS — Z794 Long term (current) use of insulin: Secondary | ICD-10-CM

## 2018-03-18 LAB — BLOOD GAS, ARTERIAL
ACID-BASE EXCESS: 13 mmol/L — AB (ref 0.0–2.0)
Acid-Base Excess: 11.6 mmol/L — ABNORMAL HIGH (ref 0.0–2.0)
Bicarbonate: 36.9 mmol/L — ABNORMAL HIGH (ref 20.0–28.0)
Bicarbonate: 39.4 mmol/L — ABNORMAL HIGH (ref 20.0–28.0)
DRAWN BY: 44135
Delivery systems: POSITIVE
Drawn by: 53527
EXPIRATORY PAP: 6
FIO2: 30
Inspiratory PAP: 14
LHR: 8 {breaths}/min
Mode: POSITIVE
O2 CONTENT: 1 L/min
O2 SAT: 98.1 %
O2 SAT: 94.6 %
PATIENT TEMPERATURE: 98.6
PCO2 ART: 78.9 mmHg — AB (ref 32.0–48.0)
PH ART: 7.399 (ref 7.350–7.450)
PO2 ART: 76.7 mmHg — AB (ref 83.0–108.0)
Patient temperature: 98.6
pCO2 arterial: 61 mmHg — ABNORMAL HIGH (ref 32.0–48.0)
pH, Arterial: 7.32 — ABNORMAL LOW (ref 7.350–7.450)
pO2, Arterial: 104 mmHg (ref 83.0–108.0)

## 2018-03-18 LAB — CBC
HEMATOCRIT: 48.4 % (ref 39.0–52.0)
HEMOGLOBIN: 15.1 g/dL (ref 13.0–17.0)
MCH: 28.3 pg (ref 26.0–34.0)
MCHC: 31.2 g/dL (ref 30.0–36.0)
MCV: 90.8 fL (ref 80.0–100.0)
Platelets: 201 10*3/uL (ref 150–400)
RBC: 5.33 MIL/uL (ref 4.22–5.81)
RDW: 12.5 % (ref 11.5–15.5)
WBC: 8 10*3/uL (ref 4.0–10.5)
nRBC: 0 % (ref 0.0–0.2)

## 2018-03-18 LAB — GLUCOSE, CAPILLARY
GLUCOSE-CAPILLARY: 198 mg/dL — AB (ref 70–99)
GLUCOSE-CAPILLARY: 209 mg/dL — AB (ref 70–99)
Glucose-Capillary: 254 mg/dL — ABNORMAL HIGH (ref 70–99)
Glucose-Capillary: 159 mg/dL — ABNORMAL HIGH (ref 70–99)

## 2018-03-18 LAB — CULTURE, BLOOD (ROUTINE X 2)
CULTURE: NO GROWTH
Culture: NO GROWTH
SPECIAL REQUESTS: ADEQUATE

## 2018-03-18 LAB — BASIC METABOLIC PANEL
ANION GAP: 6 (ref 5–15)
BUN: 29 mg/dL — ABNORMAL HIGH (ref 8–23)
CHLORIDE: 96 mmol/L — AB (ref 98–111)
CO2: 35 mmol/L — ABNORMAL HIGH (ref 22–32)
Calcium: 10 mg/dL (ref 8.9–10.3)
Creatinine, Ser: 1.17 mg/dL (ref 0.61–1.24)
GFR calc Af Amer: >60 mL/min (ref >60)
GFR calc non Af Amer: >60 mL/min (ref >60)
GLUCOSE: 200 mg/dL — AB (ref 70–99)
POTASSIUM: 3.8 mmol/L (ref 3.5–5.1)
Sodium: 137 mmol/L (ref 135–145)

## 2018-03-18 LAB — HIV ANTIBODY (ROUTINE TESTING W REFLEX): HIV SCREEN 4TH GENERATION: NONREACTIVE

## 2018-03-18 MED ORDER — ASPIRIN EC 81 MG PO TBEC
81.0000 mg | DELAYED_RELEASE_TABLET | Freq: Every day | ORAL | Status: DC
  Administered 2018-03-18 – 2018-03-20 (×3): 81 mg via ORAL
  Filled 2018-03-18 (×3): qty 1

## 2018-03-18 MED ORDER — PANTOPRAZOLE SODIUM 40 MG PO TBEC
40.0000 mg | DELAYED_RELEASE_TABLET | Freq: Every day | ORAL | Status: DC
  Administered 2018-03-18 – 2018-03-20 (×3): 40 mg via ORAL
  Filled 2018-03-18 (×3): qty 1

## 2018-03-18 MED ORDER — METOPROLOL TARTRATE 50 MG PO TABS
50.0000 mg | ORAL_TABLET | Freq: Two times a day (BID) | ORAL | Status: DC
  Administered 2018-03-18: 50 mg via ORAL
  Filled 2018-03-18 (×2): qty 1

## 2018-03-18 NOTE — Progress Notes (Addendum)
Family Medicine Teaching Service Daily Progress Note Intern Pager: [phone]  Patient name: Robert Ramirez Medical record number: [id number] Date of birth: 06-28-1945 Age: 72 y.o. Gender: male  Primary Care Provider: Patient, No Pcp Per Consultants: Pulmonology, Cardiology Code Status: Full code  Pt Overview and Major Events to Date:  03/13/2018 Admitted 03/13/2018 Stepdown for BiPAP 03/13/2018 CT chest w/o contrast, Echocardiogram 03/16/2018 Nuclear stress test 03/17/2018 R&L cardiac cath Hospital Day: 6   Assessment and Plan: Robert Ramirez a 72 y.o.malepresenting with several days of SOB. PMH is significant forT2DM, HTN, and HLD.  Nausea and Vomiting: acute Nausea with two episodes of NBNB vomiting this morning. One after a walk down the hall without oxygen. Received 1 dose IV zofran with some relief. Second one while laying in bed with oxygen a few hours later during physical exam. No abdominal pain. Last BM yesterday, reported firm and brown without blood. Vitals were stable and patient was afebrile with a temp of 97.7. Patient notes eating well last night after his cath. Few crackers this morning. No recurrence since. Possible etiologies for symptoms include post-catheterization nausea or 2/2 to BiPAP vs less likely obstruction. For now, will continue to watch patient. If patient can tolerate lunch without any further symptoms then possible d/c today. If further episodes, then will plan for further workup such as a KUB. - Continue to monitor - Consider KUB if recurrence   "Violacious Patch" on R temporal scalp: chronic, unknown etiology (see media) HIV negative. Thus unlikely Kaposi sarcoma. Still possibly Angiosarcoma vs atypical hemangioma. Although differential is vast, highly recommend biopsy for further work-up.   Hypercapnic Hypoxemic Respiratory Failure likely 2/2 Asbestos: Acute on Chronic, improved Cardiac cath signifiacnt for 60% stenosis of mid-distal RCA without  need for intervention and mildly elevated RV and PA pressure. AM ABG improved: pH 7.399, CO2 80.5>61, and bicarb 36.9. CBC and CMP WNL. Patient tolerating 0.5-2L [place] with O2 sats 90-100% with BiPAP overnight. Cardiology recommends ASA 81mg  for moderate CAD. Pt to follow-up with pulmonology outpt for PFT's and sleep study. Discharge on 1L [place] daily with BiPAP at night. Cath site was well appearing without erythema or edema. This morning patient is A&Ox3 although extremely drowsy. Due to increased somnonlence, will repeat ABG and continue to monitor mental status. Will also consult PT/OT for further evaluation prior to discharge. -Pulmonology following, appreciate recs: 1L O2 daily, nightly BiPAP, outpatient sleep study and PFTs  - BiPAP ordered placed - BiPAP qHS   - F/u OP with pulmonology on 03/30/18 -cards following - daily ASA 81mg  -care mgmt c/s to get patient bipap - Repeat ABG - Consult PT/OT, will follow up -continuous PO2, [place] as needed and every night bipap  -daily wts, I/os -outpt chest CT 3 months  Possible Pulmonary HTN: mild Echo EF 60-65%, D-shaped septum, RV dilated. Cardiac cath significant for only mildly elevated RV and PA pressure. - Cardiology and Pulmonology following  Hyponatremia: resolved  - monitor on daily labs  DGU:[id number], stable, this am 111/68 - Continue home Lisinopril-HCTZ and metoprolol   HLD: chronic Lipid panel 01/19/2015: LDL 119, Triglycerides 191 -ContinueAtorvastatin 40mg  QD  T2DM: chronic, uncontrolled. A1C 10.2. Per chart review, patient is unable to aford Lantus which is why he is on NPH. BS this AM: 198. Consider increase in Lantus for elevated BS's during admission - Continue Lantus 20U qHS - mSSI  - CBGs AC/HS - Care management consulted for help affording medications/bipap  Fluids: Saline lock Electrolytes: Replace PRN Nutrition: Carb modified diet  GI ppx: none DVT ppx: Lovenox  Disposition: Home pending  improvement   Medications: Scheduled Meds: . aspirin EC  81 mg Oral Daily  . atorvastatin  40 mg Oral q1800  . feeding supplement (GLUCERNA SHAKE)  237 mL Oral BID BM  . lisinopril  20 mg Oral Daily   And  . hydrochlorothiazide  25 mg Oral Daily  . insulin aspart  0-15 Units Subcutaneous TID WC  . insulin aspart  0-5 Units Subcutaneous QHS  . insulin glargine  20 Units Subcutaneous QHS  . levothyroxine  25 mcg Oral QAC breakfast  . mouth rinse  15 mL Mouth Rinse BID  . metoprolol tartrate  100 mg Oral BID  . pantoprazole  40 mg Oral Daily  . prednisoLONE acetate  1 drop Both Eyes QID  . sodium chloride flush  3 mL Intravenous Q12H   Continuous Infusions: . sodium chloride     PRN Meds: sodium chloride, acetaminophen, ipratropium-albuterol, ondansetron (ZOFRAN) IV, sodium chloride flush  Subjective:  No complaints overnight. Denies SOB. Tolerated bipap mask overnight but finds it uncomfortable. Went for a walk down the hall this morning around 0400 without oxygen. Reports becoming dizzy and nauseous with an episode of vomiting. Currently feels nauseous (1 episode of vomit during exam). Very sleepy today. Reports eating well last night "when he finally got to eat" and had a few crackers this morning. Otherwise he does not feel unwell. Denies constipation or diarrhea. Last BM  Yesterday that was normal appearing.    Objective: Vital Signs Temp:  [97.7 F (36.5 C)-98.3 F (36.8 C)] 98.1 F (36.7 C) (10/24 0742) Pulse Rate:  [52-66] 52 (10/24 0742) Resp:  [16-24] 19 (10/24 0742) BP: (105-141)/(57-92) 141/92 (10/24 0742) SpO2:  [93 %-100 %] 94 % (10/24 0742) Weight:  [86.5 kg] 86.5 kg (10/24 0436)  Intake/Output 10/23 0701 - 10/24 0700 In: 360 [P.O.:360] Out: 750 [Urine:750]   Physical Exam:  Gen: NAD, somnolent during exam, non-toxic Skin: Warm and dry. Large well-demarcated violet colored patch with raised nodular areas and scattered nodules located on right temporal scalp  behind right ear extending to parietal scalp and mandibular angle (see media), soft to palpation, nontender. Catheter site healing well without any signs of erythema or edema, nontender to palpation HEENT: NCAT No conjunctival pallor or injection. No scleral icterus or injection.  MMM.  CV: RRR. <2s capillary refill bilaterally.  RP & DPs 2+ bilaterally. Trace b/l LE edema Resp: CTAB.  No wheezing, rales, abnormal lung sounds.  No increased WOB Abd: soft, NTND on palpation to all 4 quadrants.  Positive bowel sounds. Psych: Cooperative with exam. Pleasant. Makes eye contact. Speech normal. Extremities: Moves all extremities spontaneously  Neuro: A&Ox3  Laboratory: Recent Labs  Lab 03/13/18 0057 03/18/18 0320  WBC 10.3 8.0  HGB 16.9 15.1  HCT 51.3 48.4  PLT 200 201   Recent Labs  Lab 03/13/18 0057  03/16/18 0348 03/17/18 0320 03/18/18 0320  NA 132*   < > 136 136 137  K 4.2   < > 5.0 4.2 3.8  CL 91*   < > 88* 95* 96*  CO2 32   < > 42* 32 35*  BUN 11   < > 37* 24* 29*  CREATININE 0.89   < > 1.27* 0.96 1.17  CALCIUM 10.2   < > 10.6* 10.1 10.0  PROT 6.0*  --   --   --   --   BILITOT 1.7*  --   --   --   --  ALKPHOS 86  --   --   --   --   ALT 11  --   --   --   --   AST 26  --   --   --   --   GLUCOSE 118*   < > 240* 181* 200*   < > = values in this interval not displayed.   Imaging/Diagnostic Tests: Dg Chest 2 View  Result Date: 03/13/2018 CLINICAL DATA:  72 y/o  M; shortness of breath. EXAM: CHEST - 2 VIEW COMPARISON:  None. FINDINGS: Mildly enlarged cardiac silhouette given projection and technique. Calcified pleural plaques. Bilateral pleural thickening versus small pleural effusions. Patchy central and basilar airspace opacities. Bones are unremarkable. IMPRESSION: 1. Patchy central and basilar airspace opacities, possibly chronic lung disease or pneumonia. 2. Calcified pleural plaques. Bilateral pleural thickening versus small pleural effusions. 3. Mildly enlarged cardiac  silhouette. Electronically Signed   By: Mitzi Hansen M.D.   On: 03/13/2018 01:51   Ct Chest Wo Contrast  Result Date: 03/13/2018 CLINICAL DATA:  Shortness of breath. EXAM: CT CHEST WITHOUT CONTRAST TECHNIQUE: Multidetector CT imaging of the chest was performed following the standard protocol without IV contrast. COMPARISON:  Radiographs of same day. FINDINGS: Cardiovascular: Atherosclerosis of thoracic aorta is noted without aneurysm formation. Normal cardiac size. Mild coronary artery calcifications are noted. No pericardial effusion is noted. Mediastinum/Nodes: Thyroid gland and esophagus are unremarkable. Calcified lymph nodes are noted in the mediastinum most consistent with prior granulomatous disease. Lungs/Pleura: No pneumothorax is noted. Calcified pleural plaques are noted bilaterally consistent with asbestos exposure. Mild bibasilar subsegmental atelectasis or scarring is noted. Significant pleural thickening is noted in medial portion of right lower lobe which may represent scarring, but possible mass cannot be excluded. Upper Abdomen: No acute abnormality. Musculoskeletal: No chest wall mass or suspicious bone lesions identified. IMPRESSION: Calcified pleural plaques are noted bilaterally consistent with asbestos exposure. Significant pleural thickening is noted along the medial portion of right lower lobe which most likely represent scarring, but possible neoplasm cannot be excluded. Follow-up CT scan of the chest in 3 months is recommended to ensure stability. Calcified mediastinal lymph nodes are noted most consistent with prior granulomatous disease. Mild bibasilar subsegmental atelectasis or scarring is noted. Aortic aneurysm NOS (ICD10-I71.9). Electronically Signed   By: Lupita Raider, M.D.   On: 03/13/2018 15:37   Nm Myocar Multi W/spect W/wall Motion / Ef  Result Date: 03/16/2018 CLINICAL DATA:  Acute coronary syndrome. Hypertension, diabetes, previous tobacco abuse,  shortness of breath EXAM: MYOCARDIAL IMAGING WITH SPECT (REST AND PHARMACOLOGIC-STRESS) GATED LEFT VENTRICULAR WALL MOTION STUDY LEFT VENTRICULAR EJECTION FRACTION TECHNIQUE: Standard myocardial SPECT imaging was performed after resting intravenous injection of 10 mCi Tc-58m tetrofosmin. Subsequently, intravenous infusion of Lexiscan was performed under the supervision of the Cardiology staff. At peak effect of the drug, 30 mCi Tc-28m tetrofosmin was injected intravenously and standard myocardial SPECT imaging was performed. Quantitative gated imaging was also performed to evaluate left ventricular wall motion, and estimate left ventricular ejection fraction. COMPARISON:  None. FINDINGS: Perfusion: Small area of mildly decreased activity in the anteroapical/apicoseptal region of the left ventricular myocardium on the stress study. This is partially improved on the resting study. Otherwise physiologic distribution of radiopharmaceutical. Wall Motion: Mild hypokinesis.  No left ventricular dilatation. Left Ventricular Ejection Fraction: 47 % End diastolic volume 99 ml End systolic volume 52 ml IMPRESSION: 1. Small region of anteroapical ischemia.  No definite infarct. 2. Mild left ventricular hypokinesis without LVE.  3. Left ventricular ejection fraction 47% 4. Non invasive risk stratification*: Intermediate *2012 Appropriate Use Criteria for Coronary Revascularization Focused Update: J Am Coll Cardiol. 2012;59(9):857-881. http://content.[email or web address].aspx?articleid=[email or web address] Electronically Signed   By: Corlis Leak M.D.   On: 03/16/2018 14:04    Echo 03/13/18 Study Conclusions  - Left ventricle: The cavity size was normal. Wall thickness was   normal. Systolic function was normal. The estimated ejection   fraction was in the range of 60% to 65%. Wall motion was normal;   there were no regional wall motion abnormalities. Doppler   parameters are consistent with abnormal left ventricular   relaxation  (grade 1 diastolic dysfunction). - Ventricular septum: D-shaped interventricular septum suggestive   of RV pressure/volume overload. - Aortic valve: There was no stenosis. - Aorta: Mildly dilated aortic root. Aortic root dimension: 41 mm   (ED). - Mitral valve: Mildly calcified annulus. There was no significant   regurgitation. - Right ventricle: The cavity size was mildly dilated. Systolic   function was mildly reduced. - Right atrium: The atrium was mildly dilated. - Pulmonary arteries: No complete TR doppler jet so unable to   estimate PA systolic pressure. - Pericardium, extracardiac: A trivial pericardial effusion was   identified.  Impressions:  - Normal LV size with EF 60-65%. D-shaped interventricular septum   suggestive of RV pressure/volume overload. Mildly dilated RV with   mildly decreased systolic function. No significant valvular   abnormalities. Consider PE in differential.   Orpah Cobb, DO PGY-1,  Family Medicine 03/18/2018 1:13 PM FPTS Intern pager: [phone], text pages welcome

## 2018-03-18 NOTE — Progress Notes (Signed)
SATURATION QUALIFICATIONS: (This note is used to comply with regulatory documentation for home oxygen)  Patient Saturations on Room Air at Rest = 95%  Patient Saturations on Room Air while Ambulating = 85%  Patient Saturations on 2 Liters of oxygen while Ambulating = 93%  Please briefly explain why patient needs home oxygen: Pt requires supplemental oxygen to maintain SpO2 >90% with mobility.  Ina Homes, PT, DPT Acute Rehabilitation Services  Pager [phone] Office [phone]

## 2018-03-18 NOTE — Consult Note (Signed)
Ref: Patient, No Pcp Per   Subjective:  No chest pain. He had episodes of nausea and vomiting. VS stable.  Objective:  Vital Signs in the last 24 hours: Temp:  [97.7 F (36.5 C)-98.3 F (36.8 C)] 98.1 F (36.7 C) (10/24 0742) Pulse Rate:  [52-66] 52 (10/24 0742) Cardiac Rhythm: Sinus bradycardia (10/24 0800) Resp:  [12-103] 19 (10/24 0742) BP: (102-143)/(49-92) 141/92 (10/24 0742) SpO2:  [91 %-100 %] 94 % (10/24 0742) Weight:  [86.5 kg] 86.5 kg (10/24 0436)  Physical Exam: BP Readings from Last 1 Encounters:  03/18/18 (!) 141/92     Wt Readings from Last 1 Encounters:  03/18/18 86.5 kg    Weight change: -2.449 kg Body mass index is 24.48 kg/m. HEENT: [place]/AT, Eyes-Blue, PERL, EOMI, Conjunctiva-Pink, Sclera-Non-icteric Neck: No JVD, No bruit, Trachea midline. Lungs:  Clear, Bilateral. Cardiac:  Regular rhythm, normal S1 and S2, no S3. II/VI systolic murmur. Abdomen:  Soft, non-tender. BS present. Extremities:  No edema present. No cyanosis. No clubbing. No right groin hematoma. CNS: AxOx3, Cranial nerves grossly intact, moves all 4 extremities.  Skin: Warm and dry.   Intake/Output from previous day: 10/23 0701 - 10/24 0700 In: 360 [P.O.:360] Out: 750 [Urine:750]    Lab Results: BMET    Component Value Date/Time   NA 137 03/18/2018 0320   NA 136 03/17/2018 0320   NA 136 03/16/2018 0348   K 3.8 03/18/2018 0320   K 4.2 03/17/2018 0320   K 5.0 03/16/2018 0348   CL 96 (L) 03/18/2018 0320   CL 95 (L) 03/17/2018 0320   CL 88 (L) 03/16/2018 0348   CO2 35 (H) 03/18/2018 0320   CO2 32 03/17/2018 0320   CO2 42 (H) 03/16/2018 0348   GLUCOSE 200 (H) 03/18/2018 0320   GLUCOSE 181 (H) 03/17/2018 0320   GLUCOSE 240 (H) 03/16/2018 0348   BUN 29 (H) 03/18/2018 0320   BUN 24 (H) 03/17/2018 0320   BUN 37 (H) 03/16/2018 0348   CREATININE 1.17 03/18/2018 0320   CREATININE 0.96 03/17/2018 0320   CREATININE 1.27 (H) 03/16/2018 0348   CALCIUM 10.0 03/18/2018 0320   CALCIUM  10.1 03/17/2018 0320   CALCIUM 10.6 (H) 03/16/2018 0348   GFRNONAA >60 03/18/2018 0320   GFRNONAA >60 03/17/2018 0320   GFRNONAA 55 (L) 03/16/2018 0348   GFRAA >60 03/18/2018 0320   GFRAA >60 03/17/2018 0320   GFRAA >60 03/16/2018 0348   CBC    Component Value Date/Time   WBC 8.0 03/18/2018 0320   RBC 5.33 03/18/2018 0320   HGB 15.1 03/18/2018 0320   HCT 48.4 03/18/2018 0320   PLT 201 03/18/2018 0320   MCV 90.8 03/18/2018 0320   MCH 28.3 03/18/2018 0320   MCHC 31.2 03/18/2018 0320   RDW 12.5 03/18/2018 0320   LYMPHSABS 1.5 03/13/2018 0057   MONOABS 0.8 03/13/2018 0057   EOSABS 0.2 03/13/2018 0057   BASOSABS 0.0 03/13/2018 0057   HEPATIC Function Panel Recent Labs    03/13/18 0057  PROT 6.0*   HEMOGLOBIN A1C No components found for: HGA1C,  MPG CARDIAC ENZYMES Lab Results  Component Value Date   TROPONINI 0.03 (HH) 03/13/2018   TROPONINI <0.03 03/13/2018   TROPONINI 0.03 (HH) 03/13/2018   BNP No results for input(s): PROBNP in the last 8760 hours. TSH Recent Labs    03/13/18 0618  TSH 3.792   CHOLESTEROL No results for input(s): CHOL in the last 8760 hours.  Scheduled Meds: . aspirin EC  81 mg  Oral Daily  . atorvastatin  40 mg Oral q1800  . feeding supplement (GLUCERNA SHAKE)  237 mL Oral BID BM  . lisinopril  20 mg Oral Daily   And  . hydrochlorothiazide  25 mg Oral Daily  . insulin aspart  0-15 Units Subcutaneous TID WC  . insulin aspart  0-5 Units Subcutaneous QHS  . insulin glargine  20 Units Subcutaneous QHS  . levothyroxine  25 mcg Oral QAC breakfast  . mouth rinse  15 mL Mouth Rinse BID  . metoprolol tartrate  100 mg Oral BID  . prednisoLONE acetate  1 drop Both Eyes QID  . sodium chloride flush  3 mL Intravenous Q12H   Continuous Infusions: . sodium chloride     PRN Meds:.sodium chloride, acetaminophen, ipratropium-albuterol, ondansetron (ZOFRAN) IV, sodium chloride flush  Assessment/Plan: Mild to moderate CAD (RCA) Acute respiratory  failure with hypercarbia Nausea and vomiting Mild RV systolic dysfunction Mild pulmonary systolic hypertension Type 2 DM Hypertension Hyperlipidemia  Medical treatment. Already on ASA, metoprolol, lisinopril and Atorvastatin. Nausea vomiting from medications +/- diabetic gastroparesis. Add Proton pump inhibitor and zofran. F/U in 2-4 weeks.    LOS: 4 days    Orpah Cobb  MD  03/18/2018, 11:54 AM

## 2018-03-18 NOTE — Evaluation (Signed)
Physical Therapy Evaluation & Discharge Patient Details Name: Robert Ramirez MRN: [id number] DOB: 1945/09/30 Today's Date: 03/18/2018   History of Present Illness  Pt is a 72 y.o. male admitted 03/13/18 with worsening SOB and LE edema.  Worked up for hypercapnic hypoxemic respiratory failure likely secondary to asbestos. S/p cath 10/23 without need for intervention. PMH includes HTN, DM2.    Clinical Impression  Patient evaluated by Physical Therapy with no further acute PT needs identified. PTA, pt indep and lives with wife. Today, pt requesting use of RW and mod indep ambulating with this. SpO2 down to 85% on RA while walking (see saturations qualifications note). All education has been completed and the patient has no further questions. Acute PT is signing off. Thank you for this referral.    Follow Up Recommendations No PT follow up;Supervision for mobility/OOB    Equipment Recommendations  None recommended by PT    Recommendations for Other Services       Precautions / Restrictions Precautions Precautions: Fall Restrictions Weight Bearing Restrictions: No      Mobility  Bed Mobility Overal bed mobility: Modified Independent             General bed mobility comments: HOB elevated  Transfers Overall transfer level: Independent Equipment used: None                Ambulation/Gait Ambulation/Gait assistance: Modified independent (Device/Increase time);Supervision Gait Distance (Feet): 300 Feet Assistive device: None;Rolling walker (2 wheeled) Gait Pattern/deviations: Step-through pattern;Decreased stride length Gait velocity: Decreased Gait velocity interpretation: 1.31 - 2.62 ft/sec, indicative of limited community ambulator General Gait Details: Pt able to amb in room without DME and supervision, requesting use of RW for hallway ambulation. Mod indep with RW  Stairs            Wheelchair Mobility    Modified Rankin (Stroke Patients Only)        Balance Overall balance assessment: Needs assistance   Sitting balance-Leahy Scale: Good       Standing balance-Leahy Scale: Fair                               Pertinent Vitals/Pain Pain Assessment: No/denies pain    Home Living Family/patient expects to be discharged to:: Private residence Living Arrangements: Spouse/significant other Available Help at Discharge: Family;Available 24 hours/day Type of Home: House Home Access: Stairs to enter   [employer] of Steps: 1 Home Layout: One level Home Equipment: Cane - single point      Prior Function Level of Independence: Independent         Comments: Intermittent use of SPC or walking stick     Hand Dominance        Extremity/Trunk Assessment   Upper Extremity Assessment Upper Extremity Assessment: Overall WFL for tasks assessed    Lower Extremity Assessment Lower Extremity Assessment: Overall WFL for tasks assessed       Communication   Communication: No difficulties  Cognition Arousal/Alertness: Awake/alert Behavior During Therapy: WFL for tasks assessed/performed Overall Cognitive Status: Within Functional Limits for tasks assessed                                        General Comments      Exercises     Assessment/Plan    PT Assessment Patent does not need any further PT  services  PT Problem List         PT Treatment Interventions      PT Goals (Current goals can be found in the Care Plan section)  Acute Rehab PT Goals PT Goal Formulation: All assessment and education complete, DC therapy    Frequency     Barriers to discharge        Co-evaluation               AM-PAC PT "6 Clicks" Daily Activity  Outcome Measure Difficulty turning over in bed (including adjusting bedclothes, sheets and blankets)?: None Difficulty moving from lying on back to sitting on the side of the bed? : None Difficulty sitting down on and standing up from a  chair with arms (e.g., wheelchair, bedside commode, etc,.)?: None Help needed moving to and from a bed to chair (including a wheelchair)?: None Help needed walking in hospital room?: None Help needed climbing 3-5 steps with a railing? : A Little 6 Click Score: 23    End of Session Equipment Utilized During Treatment: Gait belt;Oxygen Activity Tolerance: Patient tolerated treatment well Patient left: in bed;with call bell/phone within reach Nurse Communication: Mobility status PT Visit Diagnosis: Other abnormalities of gait and mobility (R26.89)    Time: 6578-4696 PT Time Calculation (min) (ACUTE ONLY): 24 min   Charges:   PT Evaluation $PT Eval Moderate Complexity: 1 Mod PT Treatments $Gait Training: 8-22 mins       Ina Homes, PT, DPT Acute Rehabilitation Services  Pager [phone] Office [phone]  Malachy Chamber 03/18/2018, 3:05 PM

## 2018-03-18 NOTE — Progress Notes (Signed)
PULMONARY / CRITICAL CARE MEDICINE   NAME:  Robert Ramirez, MRN:  [id number], DOB:  1945-11-26, LOS: 4 ADMISSION DATE:  03/13/2018, CONSULTATION DATE: 03/14/2018 REFERRING MD: Dr. Jennette Kettle, CHIEF COMPLAINT: Shortness of breath  BRIEF HISTORY:    Patient presented with worsening shortness of breath, lower extremity edema, not acting his usual according to family member. Found to have hypercarbic failure and volume overload. Improved with BiPAP and typical therapies. Pleural plaques discovered on CT. PCCM consult. Cause of hypercarbia is not clear but he has tolerated BiPAP well and would suggest that we discharge him on BiPAP 10/5 and schedule outpatient sleep study   CT chest shows bilateral small effusions and pleural plaques and calcified lymphadenopathy suggestive of old granulomatous disease. 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 He quit smoking more than 35 years ago and smoked less than 10 pack years. Echo surprisingly shows D-shaped septum and RV dilatation suggestive of severe pulmonary hypertension and cor pulmonale.  SIGNIFICANT PAST MEDICAL HISTORY   Progressive shortness of breath led to recent presentation History of diabetes History of hypertension No diagnosis of lung disease in the past   reports that he has quit smoking. He has never used smokeless tobacco.   SIGNIFICANT EVENTS:  10/19 CT - IMPRESSION: 1. Patchy central and basilar airspace opacities, possibly chronic lung disease or pneumonia. 2. Calcified pleural plaques. Bilateral pleural thickening versus small pleural effusions. 3. Mildly enlarged cardiac silhouette.   Electronically Signed   By: Mitzi Hansen M.D.   On: 03/13/2018 01:51  10/23 - s/p Cath Non obst CAD 60% RCA. Nrmal LVEDP. Mildyly elevated PASP 46/16  and RV pressure but PA mean26  and PCWP normal 8  Pulse ox 88% on RA at rest. 96% on 1L [place] now  CULTURES:   Results for orders placed or  performed during the hospital encounter of 03/13/18  Culture, blood (routine x 2)     Status: None   Collection Time: 03/13/18  4:38 AM  Result Value Ref Range Status   Specimen Description BLOOD RIGHT ANTECUBITAL  Final   Special Requests   Final    BOTTLES DRAWN AEROBIC AND ANAEROBIC Blood Culture adequate volume   Culture   Final    NO GROWTH 5 DAYS Performed at [facility], [address], [place], [place] [address]    Report Status 03/18/2018 FINAL  Final  Culture, blood (routine x 2)     Status: None   Collection Time: 03/13/18  4:50 AM  Result Value Ref Range Status   Specimen Description BLOOD RIGHT WRIST  Final   Special Requests   Final    BOTTLES DRAWN AEROBIC AND ANAEROBIC Blood Culture results may not be optimal due to an inadequate volume of blood received in culture bottles   Culture   Final    NO GROWTH 5 DAYS Performed at [facility], [address], [place], [place] [address]    Report Status 03/18/2018 FINAL  Final     ANTIBIOTICS:   Anti-infectives (From admission, onward)   Start     Dose/Rate Route Frequency Ordered Stop   03/13/18 0300  cefTRIAXone (ROCEPHIN) 2 g in sodium chloride 0.9 % 100 mL IVPB     2 g 200 mL/hr over 30 Minutes Intravenous  Once 03/13/18 0246 03/13/18 0342   03/13/18 0300  azithromycin (ZITHROMAX) 500 mg in sodium chloride 0.9 % 250 mL IVPB     500 mg 250 mL/hr  over 60 Minutes Intravenous  Once 03/13/18 0246 03/13/18 0451       SUBJECTIVE:   03/18/2018 -  Used bipap last night. ABG still with hypercapnia. Denies eye lid droop by evening. Now on 1L [place] - 89%. Says feels well enough tto go home. He is ok doing bipap QHS at home. Card sRx Non obs CAD with medical mgmt. Saye he walked all hallway in unit without oxugen and did not have dyspnea/chest pain. Currently has mild nausea earlier today but resolved. Having lunch/ A   CONSTITUTIONAL: BP (!) 141/92 (BP Location: Left Arm)   Pulse (!) 52   Temp 98.1 F (36.7  C) (Oral)   Resp 19   Ht 6\' 2"  (1.88 m)   Wt 86.5 kg Comment: Scale B  SpO2 94%   BMI 24.48 kg/m   I/O last 3 completed shifts: In: 420.9 [P.O.:360; I.V.:60.9] Out: 1350 [Urine:1350]  PHYSICAL EXAM: General Appearance:  Looks well. Sitting and trying to eat Head:  Normocephalic, without obvious abnormality, atraumatic Eyes:  PERRL - yes, conjunctiva/corneas - clear     Ears:  Normal external ear canals, both ears Nose:  G tube - no Throat:  ETT TUBE - no , OG tube - no Neck:  Supple,  No enlargement/tenderness/nodules Lungs: Clear to auscultation bilaterally, Heart:  S1 and S2 normal, no murmur, CVP - no.  Pressors - no Abdomen:  Soft, no masses, no organomegaly Genitalia / Rectal:  Not done Extremities:  Extremities- no Skin:  ntact in exposed areas Neurologic:  Sedation - none -> RASS - +1 . Moves all 4s - yes. CAM-ICU - neg . Orientation - x3+      LABS    PULMONARY Recent Labs  Lab 03/14/18 1333 03/14/18 1745 03/14/18 2109 03/17/18 1210 03/17/18 1224 03/18/18 0350  PHART 7.338* 7.418 7.433  --  7.323* 7.399  PCO2ART 85.6* 68.2* 66.0*  --  80.5* 61.0*  PO2ART 90.8 98.4 74.2*  --  96.0 104  HCO3 44.7* 43.2* 43.5* 42.0* 41.8* 36.9*  TCO2  --   --   --  45* 44*  --   O2SAT 97.0 98.3 96.3 68.0 96.0 98.1    CBC Recent Labs  Lab 03/13/18 0057 03/18/18 0320  HGB 16.9 15.1  HCT 51.3 48.4  WBC 10.3 8.0  PLT 200 201    COAGULATION No results for input(s): INR in the last 168 hours.  CARDIAC   Recent Labs  Lab 03/13/18 0618 03/13/18 1110 03/13/18 1702  TROPONINI 0.03* <0.03 0.03*   No results for input(s): PROBNP in the last 168 hours.   CHEMISTRY Recent Labs  Lab 03/14/18 0403 03/15/18 0307 03/16/18 0348 03/17/18 0320 03/18/18 0320  NA 140 138 136 136 137  K 3.6 3.6 5.0 4.2 3.8  CL 89* 90* 88* 95* 96*  CO2 44* 41* 42* 32 35*  GLUCOSE 124* 141* 240* 181* 200*  BUN 13 31* 37* 24* 29*  CREATININE 1.15 1.23 1.27* 0.96 1.17  CALCIUM  10.5* 10.4* 10.6* 10.1 10.0   Estimated Creatinine Clearance: 67.3 mL/min (by C-G formula based on SCr of 1.17 mg/dL).   LIVER Recent Labs  Lab 03/13/18 0057  AST 26  ALT 11  ALKPHOS 86  BILITOT 1.7*  PROT 6.0*  ALBUMIN 3.3*     INFECTIOUS Recent Labs  Lab 03/13/18 0454  LATICACIDVEN 0.65     ENDOCRINE CBG (last 3)  Recent Labs    03/17/18 2134 03/18/18 0636 03/18/18 1119  GLUCAP 254* 198*  209*         IMAGING x48h  - image(s) personally visualized  -   highlighted in bold No results found.   Recent Results (from the past 240 hour(s))  Culture, blood (routine x 2)     Status: None   Collection Time: 03/13/18  4:38 AM  Result Value Ref Range Status   Specimen Description BLOOD RIGHT ANTECUBITAL  Final   Special Requests   Final    BOTTLES DRAWN AEROBIC AND ANAEROBIC Blood Culture adequate volume   Culture   Final    NO GROWTH 5 DAYS Performed at [facility], [address] [place], [place] [address]    Report Status 03/18/2018 FINAL  Final  Culture, blood (routine x 2)     Status: None   Collection Time: 03/13/18  4:50 AM  Result Value Ref Range Status   Specimen Description BLOOD RIGHT WRIST  Final   Special Requests   Final    BOTTLES DRAWN AEROBIC AND ANAEROBIC Blood Culture results may not be optimal due to an inadequate volume of blood received in culture bottles   Culture   Final    NO GROWTH 5 DAYS Performed at [facility], [address], [place], [place] [address]    Report Status 03/18/2018 FINAL  Final     ASSESSMENT AND PLAN    Acute on chronic hypoxemic and hypercapnic resp failur with cor pulmonale and pleural plaquest  - suspect he might have copd +/- ILD asbesosis  - rule out myasthenia     - on 03/18/2018 - unchanged since 24h but overall stable. STilll hypercapnic despite several night of BiPAP use  Pl;an  - day time 1L  continue - night time bipap - continue at hme  - dc on bipap  - needs HRCT  without contrast in due course when more euvolemic  -- cointinue duoneb - check AchRAb 03/19/18    SUMMARY OF TODAY'S PLAN:   Ccm will sign off  Future Appointments  Date Time Provider Department Center  03/23/2018 12:45 PM Sherrie George, MD [facility] None  03/30/2018  9:00 AM Coral Ceo, NP [facility] None   After this followup with Dr Vassie Loll sleep/MR for asbestosis if ILD  +  Best Practice / Goals of Care / Disposition.   DVT PROPHYLAXIS: On Lovenox NUTRITION: As tolerated MOBILITY: As tolerated FAMILY DISCUSSIONS: Patient updated.   SIGNATURE    Dr. Kalman Shan, M.D., F.C.C.P,  Pulmonary and Critical Care Medicine Staff Physician, [facility] Center Director - Interstitial Lung Disease  Program  Pulmonary Fibrosis [facility] at [facility] [place], [place], [address]  Pager: [phone], If no answer or between  15:00h - 7:00h: call [phone] Telephone: [phone]  1:30 PM 03/18/2018

## 2018-03-18 NOTE — Care Management Important Message (Signed)
Important Message  Patient Details  Name: Robert Ramirez MRN: [id number] Date of Birth: [birth year]   Medicare Important Message Given:  Yes    Deisha Stull P Mckensey Berghuis 03/18/2018, 1:40 PM

## 2018-03-19 ENCOUNTER — Encounter (HOSPITAL_COMMUNITY): Payer: Self-pay | Admitting: Radiology

## 2018-03-19 ENCOUNTER — Inpatient Hospital Stay (HOSPITAL_COMMUNITY): Payer: Medicare HMO

## 2018-03-19 DIAGNOSIS — Z77090 Contact with and (suspected) exposure to asbestos: Secondary | ICD-10-CM

## 2018-03-19 LAB — GLUCOSE, CAPILLARY
GLUCOSE-CAPILLARY: 134 mg/dL — AB (ref 70–99)
GLUCOSE-CAPILLARY: 163 mg/dL — AB (ref 70–99)
Glucose-Capillary: 121 mg/dL — ABNORMAL HIGH (ref 70–99)
Glucose-Capillary: 385 mg/dL — ABNORMAL HIGH (ref 70–99)

## 2018-03-19 LAB — BLOOD GAS, ARTERIAL
Acid-Base Excess: 10.2 mmol/L — ABNORMAL HIGH (ref 0.0–2.0)
Bicarbonate: 35.6 mmol/L — ABNORMAL HIGH (ref 20.0–28.0)
DRAWN BY: 535271
O2 CONTENT: 1 L/min
O2 Saturation: 95.7 %
PATIENT TEMPERATURE: 98.6
PH ART: 7.384 (ref 7.350–7.450)
PO2 ART: 75.4 mmHg — AB (ref 83.0–108.0)
pCO2 arterial: 61 mmHg — ABNORMAL HIGH (ref 32.0–48.0)

## 2018-03-19 MED ORDER — HYDROCHLOROTHIAZIDE 25 MG PO TABS: 25.0000 mg | ORAL_TABLET | Freq: Every day | ORAL | Status: DC

## 2018-03-19 MED ORDER — LISINOPRIL 10 MG PO TABS: 20.0000 mg | ORAL_TABLET | Freq: Every day | ORAL | Status: DC

## 2018-03-19 MED ORDER — GADOBUTROL 1 MMOL/ML IV SOLN
10.0000 mL | Freq: Once | INTRAVENOUS | Status: AC | PRN
  Administered 2018-03-19: 10 mL via INTRAVENOUS

## 2018-03-19 MED ORDER — ENOXAPARIN SODIUM 40 MG/0.4ML ~~LOC~~ SOLN
40.0000 mg | SUBCUTANEOUS | Status: DC
  Administered 2018-03-19 – 2018-03-20 (×2): 40 mg via SUBCUTANEOUS
  Filled 2018-03-19 (×2): qty 0.4

## 2018-03-19 NOTE — Progress Notes (Addendum)
Nutrition Follow Up  DOCUMENTATION CODES:   Not applicable  INTERVENTION:    Glucerna Shake po BID, each supplement provides 220 kcal and 10 grams of protein  NUTRITION DIAGNOSIS:   Inadequate oral intake related to poor appetite as evidenced by per patient/family report, resloved  GOAL:   Patient will meet greater than or equal to 90% of their needs, met  MONITOR:   PO intake, Supplement acceptance, Labs, Weight trends, I & O's  ASSESSMENT:   72 y/o male PMHx DM2, HTN, HLD. Presented to ED w/ several days of SOB and LE swelling. Does not have hx of HF or CKD. Clinical picture most consistent w/ new HF. Admitted for further evaluation.    10/23 s/p R & L cardiac cath  Patient reports a good appetite.  He had some nausea & vomiting episodes. PO intake variable at 25-75% per flowsheet records.  Likes Glucerna Shakes. Drank an Ensure Enlive this AM. Labs & medications reviewed. CBG's [phone].  Diet Order:   Diet Order            Diet heart healthy/carb modified Room service appropriate? Yes; Fluid consistency: Thin  Diet effective now             EDUCATION NEEDS:   Not appropriate for education at this time  Skin: No issues  Last BM:  10/24  Height:   Ht Readings from Last 1 Encounters:  03/13/18 6' 2"  (2.22 m)   Weight:   Wt Readings from Last 1 Encounters:  03/19/18 92.7 kg   Ideal Body Weight:  86.36 kg  BMI:  Body mass index is 26.24 kg/m.  Estimated Nutritional Needs:   Kcal:  2000-2200   Protein:  100-110 gm   Fluid:  2.0-2.3 L  Arthur Holms, RD, LDN Pager #: [phone] After-Hours Pager #: [phone]

## 2018-03-19 NOTE — Care Management Note (Signed)
Case Management Note  Patient Details  Name: Robert Ramirez MRN: [id number] Date of Birth: 1945-10-26  Subjective/Objective:       Pt presents for respiratory failure from home with wife.  Pt usually independent with ADLs and does not use DME.  Pt has recently been going to eye doctor for treatment/procedure to restore his decreased eyesight.  Pt's wife drives him to appointments.     Pt and wife had been seeing a PA for the last 3 years every 3 months for primary care.  The PA recently informed them she would no longer be working with their insurance.  They have an appointment in [facility] with Dr. Celestia Khat on 03/26/18 for first appointment.   Prescriptions are received via mail order.      Action/Plan: Attempting to provide patient with a BIPAP or NIV upon d/c.  Pt has not had a sleep study and will not qualify for a BIPAP. To privately pay for BIPAP, [facility] requires a $750 down payment and credit card to be charged $250/month until sleep study/patient qualifies for insurance coverage.  Pt states he cannot afford this. He will not be reimbursed if he does qualify.   Spoke with Chalmers Cater from [employer], who also provides DME respiratory equipment to see if their BIPAP payment policy is more manageable.   He agrees that patient will not qualify for BIPAP but after reviewing chart, states that patient qualifies for a NIV if it is determined that he has COPD.   Dr. Mauri Reading advised that this could be determined with a bedside spirometry test if indicated.  Advised patient of this information.  [facility] delivered transport oxygen to patient's room, which may need to be returned if [facility] supplies equipment.    Pt has no preference for [facility] if needed.   Expected Discharge Date:                  Expected Discharge Plan:  Home/Self Care  In-House Referral:     Discharge planning Services  CM Consult  Post Acute Care Choice:  Durable Medical Equipment Choice offered to:  Patient  DME  Arranged:    DME Agency:     HH Arranged:    HH Agency:     Status of Service:  In process, will continue to follow  If discussed at Long Length of Stay Meetings, dates discussed:    Additional Comments:  Deveron Furlong, RN 03/19/2018, 4:39 PM

## 2018-03-19 NOTE — Progress Notes (Signed)
Social Progress Note:  Patient's chronic respiratory failure due to his idiopathic interstitial lung disease is life threatening. Previous ABG's have documented high PCO2 despite BiPAP therapy. Patient would benefit from non-invasive ventilation. Without this therapy, the patient is at high risk of ending up with worsening symptoms, worsened respiratory failure, need for ER visits and/or recurrent hospitalizations. Bilevel devise unable to adequately support his nocturnal ventilation needs. Patient would benefit from NIV therapy with set tidal volumes and pressure support.    Orpah Cobb [place], DO 03/19/2018, 5:30 PM PGY-1, [facility] Family Medicine Service pager [phone]

## 2018-03-19 NOTE — Progress Notes (Signed)
Family Medicine Teaching Service Daily Progress Note Intern Pager: [phone]  Patient name: Robert Ramirez Medical record number: [id number] Date of birth: August 24, 1945 Age: 72 y.o. Gender: male  Primary Care Provider: Patient, No Pcp Per Consultants: Pulmonology, Cardiology Code Status: Full code  Pt Overview and Major Events to Date:  03/13/2018 Admitted 03/13/2018 Stepdown for BiPAP 03/13/2018 CT chest w/o contrast, Echocardiogram 03/16/2018 Nuclear stress test 03/17/2018 R&L cardiac cath 03/19/2018 MRI brain Hospital Day: 7   Assessment and Plan: Robert Robertsonis a 72 y.o.malepresenting with several days of SOB. PMH is significant forT2DM, HTN, and HLD.  Nausea and Vomiting: resolved No further episodes of nausea and vomiting since yesterday s/p PPI and IV zofran. On exam, patient appeared much better, eating without trouble.   Hypercapnic Hypoxemic Respiratory Failure likely 2/2 Asbestos: Acute on Chronic, stable Patient ABG improved after strict use of BiPAP overnight ( pCO2 61>78.9>61, pH 7.399> 7.320>7.384, Bicarb 36.9>39.4>35.6). Patient desated to 85% on RA when ambulated but improved with 2L O2. Patient will need continuous O2 to maintain O2 sats at home. Case management consulted for home O2. Per pulm, concern for possible myesthenia gravis as source for worsening respiratory status. Ach-receptor antibody pending, will follow-up. There is concern that there is some other underlying process occurring as fluctuating ABG's and mental status do not seem to match up. Will obtain MRI to evaluate possible ischemic process. Possible central sleep apnea as well. Patient stable sating at 94-99% on 1L [place] today. Cath site was well appearing without erythema or edema. This morning patient is A&Ox3 and appears closer to his baseline status. Patient endorses inability to afford BiPAP upon discharge. Insurance will not cover it until sleep study is performed. Do not feel patient is stable  enough off of BiPAP to go home without it. Will further investigate this.  -Pulmonology following, appreciate recs: 1L O2 daily, nightly BiPAP, outpatient sleep study and PFTs  - F/u OP with pulmonology on 03/30/18 -cards following - daily ASA 81mg  - Care Management consulted - working with FM teat to help pt obtain home BiPAP machine - Follow-up AchRAb - Follow-up MRI brain - PT consulted - no further recommendations - OT consulted - recommend intermittent supervision and Tub/shower bench -continuous PO2, [employer] as needed and every night bipap - Duonebs q4hr PRN  - daily wts, I/os - outpt chest CT 3 months  VOZ:DGUYQIH, stable, Softer blood pressures this am - 95/50. Metoprolol decreased from 100 to 50mg  yesterday. Soft BP's this morning and HR in the 60's. Will hold lisinopril-HCTZ for today, stop metoprolol, and continue to monitor. - Hold Lisinopril-HCTZ - Stop metoprolol  "Violacious Patch" on R temporal scalp: chronic, unknown etiology (see media) Highly recommend dermatology referral and further workup as outpatient. - Ambulatory referral to dermatology at discharge   Possible Pulmonary HTN: mild Echo EF 60-65%, D-shaped septum, RV dilated. Cardiac cath significant for only mildly elevated RV and PA pressure. - Cardiology and Pulmonology following  Hyponatremia: resolved  - monitor on daily labs  HLD: chronic Lipid panel 01/19/2015: LDL 119, Triglycerides 191 -ContinueAtorvastatin 40mg  QD  T2DM: chronic, uncontrolled. A1C 10.2. Per chart review, patient is unable to aford Lantus which is why he is on NPH. BS this AM: 121. - Continue Lantus 20U qHS?? - mSSI  - CBGs AC/HS - Care management consulted for help affording medications/bipap  Fluids: Saline lock Electrolytes: Replace PRN Nutrition: Carb modified diet GI ppx: none DVT ppx: Lovenox  Disposition: Home pending improvement   Medications: Scheduled Meds: . aspirin  EC  81 mg Oral Daily  . atorvastatin  40  mg Oral q1800  . enoxaparin (LOVENOX) injection  40 mg Subcutaneous Q24H  . feeding supplement (GLUCERNA SHAKE)  237 mL Oral BID BM  . [START ON 03/20/2018] hydrochlorothiazide  25 mg Oral Daily   And  . [START ON 03/20/2018] lisinopril  20 mg Oral Daily  . insulin aspart  0-15 Units Subcutaneous TID WC  . insulin aspart  0-5 Units Subcutaneous QHS  . insulin glargine  20 Units Subcutaneous QHS  . levothyroxine  25 mcg Oral QAC breakfast  . mouth rinse  15 mL Mouth Rinse BID  . pantoprazole  40 mg Oral Daily  . prednisoLONE acetate  1 drop Both Eyes QID  . sodium chloride flush  3 mL Intravenous Q12H   Continuous Infusions: . sodium chloride     PRN Meds: sodium chloride, acetaminophen, ipratropium-albuterol, ondansetron (ZOFRAN) IV, sodium chloride flush  Subjective:  Patient feeling very well today. Eating breakfast and in great spirits. Denies any SOB, chest pains, abdominal pains, nausea, vomiting, constipation, or diarrhea. Was more compliant with BIPAP machine overnight. No complaints overnight. Patient is ready to go home.  Objective: Vital Signs Temp:  [97.8 F (36.6 C)-98.8 F (37.1 C)] 97.8 F (36.6 C) (10/25 1121) Pulse Rate:  [54-62] 62 (10/25 1121) Resp:  [16-23] 23 (10/25 1121) BP: (90-105)/(45-62) 96/45 (10/25 1121) SpO2:  [94 %-100 %] 96 % (10/25 1121) Weight:  [92.7 kg] 92.7 kg (10/25 0348)  Intake/Output 10/24 0701 - 10/25 0700 In: 580 [P.O.:580] Out: 400 [Urine:400]   Physical Exam:  Gen: NAD, alert and oriented, well appearing, sitting up in bed eating breakfast  Skin: Warm and dry. Large well-demarcated violet colored patch with raised nodular areas and scattered nodules located on right temporal scalp behind right ear extending to parietal scalp and mandibular angle (see media), soft to palpation, nontender. Catheter site healing well without any signs of erythema or edema, nontender to palpation HEENT: NCAT No conjunctival pallor or injection. No  scleral icterus or injection.  MMM.  CV: RRR. <2s capillary refill bilaterally.  RP & DPs 2+ bilaterally. Trace b/l LE edema Resp: CTAB.  No wheezing, rales, abnormal lung sounds.  No increased WOB Abd: soft, NTND on palpation to all 4 quadrants.  Positive bowel sounds. Psych: Cooperative with exam. Pleasant. Makes eye contact. Speech normal. Extremities: Moves all extremities spontaneously  Neuro: A&Ox3  Laboratory: Recent Labs  Lab 03/13/18 0057 03/18/18 0320  WBC 10.3 8.0  HGB 16.9 15.1  HCT 51.3 48.4  PLT 200 201   Recent Labs  Lab 03/13/18 0057  03/16/18 0348 03/17/18 0320 03/18/18 0320  NA 132*   < > 136 136 137  K 4.2   < > 5.0 4.2 3.8  CL 91*   < > 88* 95* 96*  CO2 32   < > 42* 32 35*  BUN 11   < > 37* 24* 29*  CREATININE 0.89   < > 1.27* 0.96 1.17  CALCIUM 10.2   < > 10.6* 10.1 10.0  PROT 6.0*  --   --   --   --   BILITOT 1.7*  --   --   --   --   ALKPHOS 86  --   --   --   --   ALT 11  --   --   --   --   AST 26  --   --   --   --  GLUCOSE 118*   < > 240* 181* 200*   < > = values in this interval not displayed.   Imaging/Diagnostic Tests: Dg Chest 2 View  Result Date: 03/13/2018 CLINICAL DATA:  72 y/o  M; shortness of breath. EXAM: CHEST - 2 VIEW COMPARISON:  None. FINDINGS: Mildly enlarged cardiac silhouette given projection and technique. Calcified pleural plaques. Bilateral pleural thickening versus small pleural effusions. Patchy central and basilar airspace opacities. Bones are unremarkable. IMPRESSION: 1. Patchy central and basilar airspace opacities, possibly chronic lung disease or pneumonia. 2. Calcified pleural plaques. Bilateral pleural thickening versus small pleural effusions. 3. Mildly enlarged cardiac silhouette. Electronically Signed   By: Mitzi Hansen M.D.   On: 03/13/2018 01:51   Ct Chest Wo Contrast  Result Date: 03/13/2018 CLINICAL DATA:  Shortness of breath. EXAM: CT CHEST WITHOUT CONTRAST TECHNIQUE: Multidetector CT imaging  of the chest was performed following the standard protocol without IV contrast. COMPARISON:  Radiographs of same day. FINDINGS: Cardiovascular: Atherosclerosis of thoracic aorta is noted without aneurysm formation. Normal cardiac size. Mild coronary artery calcifications are noted. No pericardial effusion is noted. Mediastinum/Nodes: Thyroid gland and esophagus are unremarkable. Calcified lymph nodes are noted in the mediastinum most consistent with prior granulomatous disease. Lungs/Pleura: No pneumothorax is noted. Calcified pleural plaques are noted bilaterally consistent with asbestos exposure. Mild bibasilar subsegmental atelectasis or scarring is noted. Significant pleural thickening is noted in medial portion of right lower lobe which may represent scarring, but possible mass cannot be excluded. Upper Abdomen: No acute abnormality. Musculoskeletal: No chest wall mass or suspicious bone lesions identified. IMPRESSION: Calcified pleural plaques are noted bilaterally consistent with asbestos exposure. Significant pleural thickening is noted along the medial portion of right lower lobe which most likely represent scarring, but possible neoplasm cannot be excluded. Follow-up CT scan of the chest in 3 months is recommended to ensure stability. Calcified mediastinal lymph nodes are noted most consistent with prior granulomatous disease. Mild bibasilar subsegmental atelectasis or scarring is noted. Aortic aneurysm NOS (ICD10-I71.9). Electronically Signed   By: Lupita Raider, M.D.   On: 03/13/2018 15:37   Mr [name] WG Contrast  Result Date: 03/19/2018 CLINICAL DATA:  Altered mental status EXAM: MRI HEAD WITHOUT AND WITH CONTRAST TECHNIQUE: Multiplanar, multiecho pulse sequences of the brain and surrounding structures were obtained without and with intravenous contrast. CONTRAST:  10 mL Gadavist COMPARISON:  None. FINDINGS: BRAIN: There is no acute infarct, acute hemorrhage, hydrocephalus or extra-axial  collection. The midline structures are normal. No midline shift or other mass effect. There are no old infarcts. The white matter signal is normal for the patient's age. The cerebral and cerebellar volume are age-appropriate. Susceptibility-sensitive sequences show no chronic microhemorrhage or superficial siderosis. VASCULAR: Major intracranial arterial and venous sinus flow voids are normal. SKULL AND UPPER CERVICAL SPINE: Right parietal scalp lesion shows diffuse contrast enhancement and measures approximately 9.6 cm in length by 1 cm in thickness. The lesion begins just posterior to the right ear. SINUSES/ORBITS: No fluid levels or advanced mucosal thickening. No mastoid or middle ear effusion. The orbits are normal. IMPRESSION: 1. Diffusely contrast-enhancing, sessile, masslike lesion of the right posterior scalp, beginning just posterior to the right ear and extending to the posterior midline of the scalp. This is concerning for a cutaneous malignancy. Direct inspection is recommended, with consideration of histologic sampling. 2. Normal brain. Electronically Signed   By: Deatra Robinson M.D.   On: 03/19/2018 14:53   Nm Myocar Multi W/spect W/wall Motion /  Ef  Result Date: 03/16/2018 CLINICAL DATA:  Acute coronary syndrome. Hypertension, diabetes, previous tobacco abuse, shortness of breath EXAM: MYOCARDIAL IMAGING WITH SPECT (REST AND PHARMACOLOGIC-STRESS) GATED LEFT VENTRICULAR WALL MOTION STUDY LEFT VENTRICULAR EJECTION FRACTION TECHNIQUE: Standard myocardial SPECT imaging was performed after resting intravenous injection of 10 mCi Tc-49m tetrofosmin. Subsequently, intravenous infusion of Lexiscan was performed under the supervision of the Cardiology staff. At peak effect of the drug, 30 mCi Tc-59m tetrofosmin was injected intravenously and standard myocardial SPECT imaging was performed. Quantitative gated imaging was also performed to evaluate left ventricular wall motion, and estimate left ventricular  ejection fraction. COMPARISON:  None. FINDINGS: Perfusion: Small area of mildly decreased activity in the anteroapical/apicoseptal region of the left ventricular myocardium on the stress study. This is partially improved on the resting study. Otherwise physiologic distribution of radiopharmaceutical. Wall Motion: Mild hypokinesis.  No left ventricular dilatation. Left Ventricular Ejection Fraction: 47 % End diastolic volume 99 ml End systolic volume 52 ml IMPRESSION: 1. Small region of anteroapical ischemia.  No definite infarct. 2. Mild left ventricular hypokinesis without LVE. 3. Left ventricular ejection fraction 47% 4. Non invasive risk stratification*: Intermediate *2012 Appropriate Use Criteria for Coronary Revascularization Focused Update: J Am Coll Cardiol. 2012;59(9):857-881. http://content.[email or web address].aspx?articleid=[email or web address] Electronically Signed   By: Corlis Leak M.D.   On: 03/16/2018 14:04    Echo 03/13/18 Study Conclusions  - Left ventricle: The cavity size was normal. Wall thickness was   normal. Systolic function was normal. The estimated ejection   fraction was in the range of 60% to 65%. Wall motion was normal;   there were no regional wall motion abnormalities. Doppler   parameters are consistent with abnormal left ventricular   relaxation (grade 1 diastolic dysfunction). - Ventricular septum: D-shaped interventricular septum suggestive   of RV pressure/volume overload. - Aortic valve: There was no stenosis. - Aorta: Mildly dilated aortic root. Aortic root dimension: 41 mm   (ED). - Mitral valve: Mildly calcified annulus. There was no significant   regurgitation. - Right ventricle: The cavity size was mildly dilated. Systolic   function was mildly reduced. - Right atrium: The atrium was mildly dilated. - Pulmonary arteries: No complete TR doppler jet so unable to   estimate PA systolic pressure. - Pericardium, extracardiac: A trivial pericardial effusion was    identified.  Impressions:  - Normal LV size with EF 60-65%. D-shaped interventricular septum   suggestive of RV pressure/volume overload. Mildly dilated RV with   mildly decreased systolic function. No significant valvular   abnormalities. Consider PE in differential.   Orpah Cobb, DO PGY-1, [facility] 03/19/2018 3:09 PM FPTS Intern pager: [phone], text pages welcome

## 2018-03-19 NOTE — Progress Notes (Signed)
Took pt off bipap this AM, wore for about 6 hours (from 0000 to 0600). Tolerated well. Placed pt back on nasal cannula as pt now awake.

## 2018-03-19 NOTE — Evaluation (Signed)
Occupational Therapy Evaluation and Discharge Patient Details Name: Robert Ramirez MRN: [id number] DOB: 08/03/1945 Today's Date: 03/19/2018    History of Present Illness Pt is a 72 y.o. male admitted 03/13/18 with worsening SOB and LE edema.  Worked up for hypercapnic hypoxemic respiratory failure likely secondary to asbestos. S/p cath 10/23 without need for intervention. PMH includes HTN, DM2.   Clinical Impression   Pt is currently at mod I level with use of RW for self care and functional mobility tasks. OT recommended use of TTB for bathing to decrease fall risk and to conserve energy. Pt reports having one at some time but unsure if it is currently at home or has been given away.Pt currently on 1 L O2 via [place] during therapy session with O2 remaining at 93-95% during session. OT educated pt on energy conservation at well for self care and community mobility tasks. Pt needs no further OT intervention at this time and OT signing off. Thank you for referral.    Follow Up Recommendations  Supervision - Intermittent;Other (comment);No OT follow up(OT recommended supervision with shower transfer)    Equipment Recommendations  Tub/shower bench;Other (comment)(Pt might have one already but does not currently use.)    Recommendations for Other Services (none at this time)     Precautions / Restrictions Precautions Precautions: Fall      Mobility Bed Mobility Overal bed mobility: Modified Independent    General bed mobility comments: HOB elevated  Transfers Overall transfer level: Independent Equipment used: None         Balance Overall balance assessment: Modified Independent   Sitting balance-Leahy Scale: Good     Standing balance support: During functional activity Standing balance-Leahy Scale: Good        ADL either performed or assessed with clinical judgement   ADL Overall ADL's : Modified independent;At baseline       Vision Baseline Vision/History: Wears  glasses Wears Glasses: Reading only Patient Visual Report: No change from baseline              Pertinent Vitals/Pain Pain Assessment: No/denies pain     Hand Dominance Right   Extremity/Trunk Assessment Upper Extremity Assessment Upper Extremity Assessment: Overall WFL for tasks assessed   Lower Extremity Assessment Lower Extremity Assessment: Overall WFL for tasks assessed       Communication Communication Communication: No difficulties   Cognition Arousal/Alertness: Awake/alert Behavior During Therapy: WFL for tasks assessed/performed Overall Cognitive Status: Within Functional Limits for tasks assessed                    Home Living Family/patient expects to be discharged to:: Private residence Living Arrangements: Spouse/significant other Available Help at Discharge: Family;Available 24 hours/day Type of Home: House Home Access: Stairs to enter [employer] of Steps: 1   Home Layout: One level     Bathroom Shower/Tub: Tub/shower unit;Curtain         Home Equipment: Cane - single point   Additional Comments: may own TTB      Prior Functioning/Environment Level of Independence: Independent        Comments: Intermittent use of SPC or walking stick                 OT Goals(Current goals can be found in the care plan section) Acute Rehab OT Goals Patient Stated Goal: to return home OT Goal Formulation: With patient Time For Goal Achievement: 04/02/18 Potential to Achieve Goals: Good  OT Frequency:  AM-PAC PT "6 Clicks" Daily Activity     Outcome Measure Help from another person eating meals?: None Help from another person taking care of personal grooming?: None Help from another person toileting, which includes using toliet, bedpan, or urinal?: None Help from another person bathing (including washing, rinsing, drying)?: None Help from another person to put on and taking off regular upper body clothing?:  None Help from another person to put on and taking off regular lower body clothing?: A Little 6 Click Score: 23   End of Session Equipment Utilized During Treatment: Oxygen Nurse Communication: Mobility status  Activity Tolerance: Patient tolerated treatment well Patient left: in bed;with call bell/phone within reach  OT Visit Diagnosis: Muscle weakness (generalized) (M62.81)                Time: 1308-6578 OT Time Calculation (min): 16 min Charges:  OT General Charges $OT Visit: 1 Visit OT Evaluation $OT Eval Low Complexity: 1 Low   Yeriel Mineo P, MS, OTR/L 03/19/2018, 9:31 AM

## 2018-03-20 DIAGNOSIS — E119 Type 2 diabetes mellitus without complications: Secondary | ICD-10-CM

## 2018-03-20 DIAGNOSIS — R220 Localized swelling, mass and lump, head: Secondary | ICD-10-CM

## 2018-03-20 LAB — GLUCOSE, CAPILLARY: GLUCOSE-CAPILLARY: 306 mg/dL — AB (ref 70–99)

## 2018-03-20 MED ORDER — ASPIRIN 81 MG PO TBEC: 81.0000 mg | DELAYED_RELEASE_TABLET | Freq: Every day | ORAL | 0 refills | Status: DC

## 2018-03-20 MED ORDER — LISINOPRIL 10 MG PO TABS
10.0000 mg | ORAL_TABLET | Freq: Every day | ORAL | Status: DC
  Administered 2018-03-20: 10 mg via ORAL
  Filled 2018-03-20: qty 1

## 2018-03-20 MED ORDER — ATORVASTATIN CALCIUM 40 MG PO TABS: 40.0000 mg | ORAL_TABLET | Freq: Every day | ORAL | 0 refills | Status: DC

## 2018-03-20 MED ORDER — INSULIN NPH (HUMAN) (ISOPHANE) 100 UNIT/ML ~~LOC~~ SUSP: 15.0000 [IU] | Freq: Two times a day (BID) | SUBCUTANEOUS | 11 refills | Status: DC

## 2018-03-20 MED ORDER — LISINOPRIL 10 MG PO TABS: 10.0000 mg | ORAL_TABLET | Freq: Every day | ORAL | 0 refills | Status: DC

## 2018-03-20 NOTE — Care Management (Addendum)
0950-Information faxed to [employer] for processing of oxygen and NIV to be delivered today.  Call placed to on call center Morrie Sheldon) to schedule delivery for today.  Christoper Allegra will also deliver tub bench to patient's home with oxygen equipment.    Discussed with patient and discussed with [facility] to pick up DME O2 that was previously delivered.    Home health orders placed for patient.  Pt has no preference of agency but is familiar with [facility].  Referral called to [facility] with [facility].  Since patient has appointment this week with new PCP, Vaughan Basta accepted referral for [facility] RN and RT.  Will update note when ETA for equipment is available.    1200- O2 delivered to patient.  NIV approval is pending but will hopefully be completed and delivered this afternoon. Tub bench denied by insurance.  3n1 agreeable to patient and ordered from [facility] to be delivered to room.    1350- NIV and 3n1 delivered to room.

## 2018-03-20 NOTE — Progress Notes (Signed)
Family Medicine Teaching Service Daily Progress Note Intern Pager: [phone]  Patient name: Robert Ramirez Medical record number: [id number] Date of birth: Jul 05, 1945 Age: 72 y.o. Gender: male  Primary Care Provider: Patient, No Pcp Per Consultants: pulm, cardiology Code Status: FULL  Pt Overview and Major Events to Date:  03/13/2018 Admitted 03/13/2018 Stepdown for BiPAP 03/13/2018 CT chest w/o contrast, Echocardiogram 03/16/2018 Nuclear stress test 03/17/2018 R&L cardiac cath 03/19/2018 MRI brain  Assessment and Plan: Robert Robertsonis a 72 y.o.malepresenting with several days of SOB. PMH is significant forT2DM, HTN, and HLD.  Hypercapnic Hypoxemic Respiratory Failure, idiopathic presumed etiology :Acute on Chronic, stable. Patient desated to 85% on RA when ambulated but improved with 2L O2. Patient will need continuous O2 to maintain O2 sats at home. Case management consulted for home O2. Has had CO2 narcosis during admission requiring positive pressure overnight. Home NIV setup in process. Discharge pending setup.  -Pulmonology following, appreciate recs: 1L O2 daily, nightly BiPAP, outpatient sleep study and PFTs             - F/u OP with pulmonology on 03/30/18 -cards following - daily ASA 81mg  - Care Management consulted - working with FM team to help pt obtain home BiPAP machine - Follow-up AchRAb -continuous O2, [place] as needed and every night bipap - Duonebs q4hr PRN  - daily wts, I/os - outpt chest CT 3 months  JYN:WGNFAOZ,HYQMVH.  Patient reports he is actually been taking half of his lisinopril HCTZ at home due to findings of low blood pressures on his home cuff. -Discontinue HCTZ -Lisinopril 10 mg daily - Stop metoprolol  "Violacious Patch" on R temporal scalp: chronic, unknown etiology (see media) Highly recommend dermatology referral and further workup as outpatient. - Ambulatory referral to dermatology at discharge   Possible Pulmonary HTN: mild Echo EF  60-65%, D-shaped septum, RV dilated. Cardiac cath significant for only mildly elevated RV and PA pressure. - Cardiology and Pulmonology following  Hyponatremia: resolved - monitor on daily labs  HLD: chronic Lipid panel 01/19/2015: LDL 119, Triglycerides 191 -ContinueAtorvastatin 40mg  QD  T2DM: chronic, uncontrolled. A1C10.2. Per chart review, patient is unable to aford Lantus which is why he is on NPH.  -Discharge with 15 units of Novolin twice daily - Care management consulted for help affording medications/bipap  Fluids: Saline lock Electrolytes: Replace PRN Nutrition: Carb modified diet GI ppx: none DVT ppx: Lovenox  Disposition: Home today pending positive pressure ventilation  Subjective:  Patient feels well this morning, denies further episodes of altered mental he has been taking his dose lisinopril HCTZ.  States he no longer has a PCP and is going to be reestablishing with another PCP after discharge.  He is not sure whether they have delivered anything to his home yet regarding his sleep equipment.  For his diabetes he uses Novolin and had previously been using 40 units twice daily.  Objective: Temp:  [97.8 F (36.6 C)-98.6 F (37 C)] 97.9 F (36.6 C) (10/26 0607) Pulse Rate:  [57-75] 64 (10/26 0000) Resp:  [17-23] 19 (10/26 0000) BP: (88-135)/(45-68) 111/59 (10/26 0000) SpO2:  [96 %-100 %] 98 % (10/26 0607) Weight:  [90.1 kg] 90.1 kg (10/26 8469) Physical Exam: General: older male sitting in chair, eating breakfast Cardiovascular: RRR, no murmur Respiratory: CTAB, easy WOB  Abdomen: SNTND, +BS  Extremities: no edema  Laboratory: Recent Labs  Lab 03/18/18 0320  WBC 8.0  HGB 15.1  HCT 48.4  PLT 201   Recent Labs  Lab 03/16/18 0348 03/17/18 0320  03/18/18 0320  NA 136 136 137  K 5.0 4.2 3.8  CL 88* 95* 96*  CO2 42* 32 35*  BUN 37* 24* 29*  CREATININE 1.27* 0.96 1.17  CALCIUM 10.6* 10.1 10.0  GLUCOSE 240* 181* 200*    Imaging/Diagnostic  Tests: [name] Wo Contrast  Result Date: 03/19/2018 CLINICAL DATA:  Altered mental status EXAM: MRI HEAD WITHOUT AND WITH CONTRAST TECHNIQUE: Multiplanar, multiecho pulse sequences of the brain and surrounding structures were obtained without and with intravenous contrast. CONTRAST:  10 mL Gadavist COMPARISON:  None. FINDINGS: BRAIN: There is no acute infarct, acute hemorrhage, hydrocephalus or extra-axial collection. The midline structures are normal. No midline shift or other mass effect. There are no old infarcts. The white matter signal is normal for the patient's age. The cerebral and cerebellar volume are age-appropriate. Susceptibility-sensitive sequences show no chronic microhemorrhage or superficial siderosis. VASCULAR: Major intracranial arterial and venous sinus flow voids are normal. SKULL AND UPPER CERVICAL SPINE: Right parietal scalp lesion shows diffuse contrast enhancement and measures approximately 9.6 cm in length by 1 cm in thickness. The lesion begins just posterior to the right ear. SINUSES/ORBITS: No fluid levels or advanced mucosal thickening. No mastoid or middle ear effusion. The orbits are normal. IMPRESSION: 1. Diffusely contrast-enhancing, sessile, masslike lesion of the right posterior scalp, beginning just posterior to the right ear and extending to the posterior midline of the scalp. This is concerning for a cutaneous malignancy. Direct inspection is recommended, with consideration of histologic sampling. 2. Normal brain. Electronically Signed   By: Deatra Robinson M.D.   On: 03/19/2018 14:53    Robert Bigness, MD 03/20/2018, 7:17 AM PGY-3, [facility] FPTS Intern pager: [phone], text pages welcome

## 2018-03-20 NOTE — Progress Notes (Signed)
Discharge AVS meds take and those due reviewed with pt. Follow up appointments and when to call MD reviewed. All questions and concerns addressed. No further questions at this time. D/c IV and TELE, CCMD notified. D/C home per orders. Pt brought down via wheelchair with cart with supplies with staff.  Theophilus Kinds, RN

## 2018-03-22 LAB — GLUCOSE, CAPILLARY: Glucose-Capillary: 138 mg/dL — ABNORMAL HIGH (ref 70–99)

## 2018-03-23 ENCOUNTER — Encounter (INDEPENDENT_AMBULATORY_CARE_PROVIDER_SITE_OTHER): Payer: Medicare HMO | Admitting: Ophthalmology

## 2018-03-23 DIAGNOSIS — E11311 Type 2 diabetes mellitus with unspecified diabetic retinopathy with macular edema: Secondary | ICD-10-CM

## 2018-03-23 DIAGNOSIS — H35033 Hypertensive retinopathy, bilateral: Secondary | ICD-10-CM | POA: Diagnosis not present

## 2018-03-23 DIAGNOSIS — I10 Essential (primary) hypertension: Secondary | ICD-10-CM | POA: Diagnosis not present

## 2018-03-23 DIAGNOSIS — H43813 Vitreous degeneration, bilateral: Secondary | ICD-10-CM

## 2018-03-23 DIAGNOSIS — E113513 Type 2 diabetes mellitus with proliferative diabetic retinopathy with macular edema, bilateral: Secondary | ICD-10-CM | POA: Diagnosis not present

## 2018-03-26 DIAGNOSIS — I2510 Atherosclerotic heart disease of native coronary artery without angina pectoris: Secondary | ICD-10-CM | POA: Insufficient documentation

## 2018-03-26 DIAGNOSIS — E039 Hypothyroidism, unspecified: Secondary | ICD-10-CM | POA: Insufficient documentation

## 2018-03-26 DIAGNOSIS — Z77090 Contact with and (suspected) exposure to asbestos: Secondary | ICD-10-CM

## 2018-03-26 DIAGNOSIS — R17 Unspecified jaundice: Secondary | ICD-10-CM

## 2018-03-26 DIAGNOSIS — L989 Disorder of the skin and subcutaneous tissue, unspecified: Secondary | ICD-10-CM | POA: Insufficient documentation

## 2018-03-26 LAB — ACETYLCHOLINE RECEPTOR AB, ALL
Acety choline binding ab: <0.03 nmol/L (ref 0.00–0.24)
Acetylchol Block Ab: 25 % (ref 0–25)
Acetylcholine Modulat Ab: <12 % (ref 0–20)

## 2018-03-29 NOTE — Progress Notes (Signed)
@Patient  ID: Robert Ramirez, male    DOB: [birth year], 72 y.o.   MRN: [id number]  Chief Complaint  Patient presents with  . Hospitalization Follow-up    Recent CT, on BIpap, Asbestosis     Referring provider: No ref. provider found  HPI:  72 year old male former smoker initially consulted with our office in October/2019 during hospitalization for acute on chronic respiratory failure/acute on chronic heart failure.  Patient with elevated pulmonary pressures.  Patient also started on BiPAP.  Patient with asbestos exposure and pleural plaques on CT.  Patient will need repeat CT in January/2020.  PMH: Diabetes, hypertension Smoker/ Smoking History: Former smoker. 18.5 pack years.  Maintenance:   Pt of: Dr. Vassie Loll, could consider referral to [facility] if ILD positive   03/30/2018  - Visit   72 year old male patient presenting today for hospital follow-up.  Patient reports he is been doing well since last hospitalization.  Patient has completed follow-up with new primary care doctor.  Patient reports that blood sugars are much more controlled.  Patient has been established with a dermatologist but patient refused to see a APP and wants to only see an MD so the closest appointment will be in February/03/2019 for evaluation of his potential neoplasm on the right side head.  Patient has been using his BiPAP although he admits that he skips a few nights which we believe is from his DME company [facility].  Patient needs BiPAP titration for further evaluation for right settings. Using BIpap at home - suspect from [facility].   Patient reports he is using 1 L of O2 throughout the day occasionally as needed.  Patient reports that many times when he is on room air he is SPO2 is ranging between 94 to 97%.  Patient was evaluated by Dr. Vassie Loll and Dr. Marchelle Gearing inpatient.  Patient be established with Dr. Vassie Loll for pulmonary follow-up and if repeat high-res CT confirms ILD patient can also be established with  Dr. Marchelle Gearing in ILD clinic.  Patient has multiple concerns regarding the amount of office visits, test that he has as well as financially been able to pay for this.  Patient reports that some of his testing may need to be delayed until after he gets his Social Security check which she gets monthly second Wednesday of the month.    Tests:   03/19/2018-acetylcholine receptor- Negative 03/16/2018- myocardial imaging rest and pharmacologic stress- small region of anteroapical ischemia, no definite infarct, mild left ventricular hypokinesis, LV ejection fraction 47% 03/13/2018-CT chest high-res- calcified pleural plaques noted bilaterally, significant pleural thickening, follow-up CT in 3 months, mild bibasilar Solik subsegmental atelectasis 03/19/2018- MRI brain with contrast- diffusely contrast-enhancing sessile and masslike lesion of the right posterior scalp, concerning for cutaneous malignancy, direct inspection is recommended with consideration of histological sampling 03/13/2018-echocardiogram-LV ejection fraction 60 to 65%, grade 1 diastolic dysfunction, right ventricular septum D-shaped suggestive of RV pressure/volume overload 03/17/2018-cardiac cath- mid RCA to distal RCA lesion is 60% stenosis, LV end-diastolic pressure is normal, >>>Start aspirin 81 mg daily  FENO:  No results found for: NITRICOXIDE  PFT: No flowsheet data found.  Imaging: Dg Chest 2 View  Result Date: 03/13/2018 CLINICAL DATA:  72 y/o  M; shortness of breath. EXAM: CHEST - 2 VIEW COMPARISON:  None. FINDINGS: Mildly enlarged cardiac silhouette given projection and technique. Calcified pleural plaques. Bilateral pleural thickening versus small pleural effusions. Patchy central and basilar airspace opacities. Bones are unremarkable. IMPRESSION: 1. Patchy central and basilar airspace opacities, possibly chronic lung  disease or pneumonia. 2. Calcified pleural plaques. Bilateral pleural thickening versus small pleural  effusions. 3. Mildly enlarged cardiac silhouette. Electronically Signed   By: Mitzi Hansen M.D.   On: 03/13/2018 01:51   Ct Chest Wo Contrast  Result Date: 03/13/2018 CLINICAL DATA:  Shortness of breath. EXAM: CT CHEST WITHOUT CONTRAST TECHNIQUE: Multidetector CT imaging of the chest was performed following the standard protocol without IV contrast. COMPARISON:  Radiographs of same day. FINDINGS: Cardiovascular: Atherosclerosis of thoracic aorta is noted without aneurysm formation. Normal cardiac size. Mild coronary artery calcifications are noted. No pericardial effusion is noted. Mediastinum/Nodes: Thyroid gland and esophagus are unremarkable. Calcified lymph nodes are noted in the mediastinum most consistent with prior granulomatous disease. Lungs/Pleura: No pneumothorax is noted. Calcified pleural plaques are noted bilaterally consistent with asbestos exposure. Mild bibasilar subsegmental atelectasis or scarring is noted. Significant pleural thickening is noted in medial portion of right lower lobe which may represent scarring, but possible mass cannot be excluded. Upper Abdomen: No acute abnormality. Musculoskeletal: No chest wall mass or suspicious bone lesions identified. IMPRESSION: Calcified pleural plaques are noted bilaterally consistent with asbestos exposure. Significant pleural thickening is noted along the medial portion of right lower lobe which most likely represent scarring, but possible neoplasm cannot be excluded. Follow-up CT scan of the chest in 3 months is recommended to ensure stability. Calcified mediastinal lymph nodes are noted most consistent with prior granulomatous disease. Mild bibasilar subsegmental atelectasis or scarring is noted. Aortic aneurysm NOS (ICD10-I71.9). Electronically Signed   By: Lupita Raider, M.D.   On: 03/13/2018 15:37   Mr [name] ZO Contrast  Result Date: 03/19/2018 CLINICAL DATA:  Altered mental status EXAM: MRI HEAD WITHOUT AND WITH  CONTRAST TECHNIQUE: Multiplanar, multiecho pulse sequences of the brain and surrounding structures were obtained without and with intravenous contrast. CONTRAST:  10 mL Gadavist COMPARISON:  None. FINDINGS: BRAIN: There is no acute infarct, acute hemorrhage, hydrocephalus or extra-axial collection. The midline structures are normal. No midline shift or other mass effect. There are no old infarcts. The white matter signal is normal for the patient's age. The cerebral and cerebellar volume are age-appropriate. Susceptibility-sensitive sequences show no chronic microhemorrhage or superficial siderosis. VASCULAR: Major intracranial arterial and venous sinus flow voids are normal. SKULL AND UPPER CERVICAL SPINE: Right parietal scalp lesion shows diffuse contrast enhancement and measures approximately 9.6 cm in length by 1 cm in thickness. The lesion begins just posterior to the right ear. SINUSES/ORBITS: No fluid levels or advanced mucosal thickening. No mastoid or middle ear effusion. The orbits are normal. IMPRESSION: 1. Diffusely contrast-enhancing, sessile, masslike lesion of the right posterior scalp, beginning just posterior to the right ear and extending to the posterior midline of the scalp. This is concerning for a cutaneous malignancy. Direct inspection is recommended, with consideration of histologic sampling. 2. Normal brain. Electronically Signed   By: Deatra Robinson M.D.   On: 03/19/2018 14:53   Nm Myocar Multi W/spect W/wall Motion / Ef  Result Date: 03/16/2018 CLINICAL DATA:  Acute coronary syndrome. Hypertension, diabetes, previous tobacco abuse, shortness of breath EXAM: MYOCARDIAL IMAGING WITH SPECT (REST AND PHARMACOLOGIC-STRESS) GATED LEFT VENTRICULAR WALL MOTION STUDY LEFT VENTRICULAR EJECTION FRACTION TECHNIQUE: Standard myocardial SPECT imaging was performed after resting intravenous injection of 10 mCi Tc-30m tetrofosmin. Subsequently, intravenous infusion of Lexiscan was performed under the  supervision of the Cardiology staff. At peak effect of the drug, 30 mCi Tc-3m tetrofosmin was injected intravenously and standard myocardial SPECT imaging was performed. Quantitative  gated imaging was also performed to evaluate left ventricular wall motion, and estimate left ventricular ejection fraction. COMPARISON:  None. FINDINGS: Perfusion: Small area of mildly decreased activity in the anteroapical/apicoseptal region of the left ventricular myocardium on the stress study. This is partially improved on the resting study. Otherwise physiologic distribution of radiopharmaceutical. Wall Motion: Mild hypokinesis.  No left ventricular dilatation. Left Ventricular Ejection Fraction: 47 % End diastolic volume 99 ml End systolic volume 52 ml IMPRESSION: 1. Small region of anteroapical ischemia.  No definite infarct. 2. Mild left ventricular hypokinesis without LVE. 3. Left ventricular ejection fraction 47% 4. Non invasive risk stratification*: Intermediate *2012 Appropriate Use Criteria for Coronary Revascularization Focused Update: J Am Coll Cardiol. 2012;59(9):857-881. http://content.dementiazones.com.aspx?articleid=[email or web address] Electronically Signed   By: Corlis Leak M.D.   On: 03/16/2018 14:04    Chart Review:  03/13/2018-hospitalization- acute heart failure exacerbation >>>Discharge date 03/20/2018 Plan: Outpatient sleep study, discharge home BiPAP, pulmonary function test outpatient, follow-up CT scan in 3 months to follow pleural plaques, follow-up with dermatology primary care for management of blood sugars  Specialty Problems      Pulmonary Problems   Acute respiratory failure with hypoxia and hypercarbia (HCC)   Pleural plaque    Hx asbestos exposure        No Known Allergies  Immunization History  Administered Date(s) Administered  . Influenza, High Dose Seasonal PF 03/14/2018   >>>Pt has received pneumonia vaccine: [id number] >>>will request records from former PCP   Past Medical  History:  Diagnosis Date  . Diabetes mellitus   . High cholesterol   . Hypertension     Tobacco History: Social History   Tobacco Use  Smoking Status Former Smoker  . Packs/day: 0.50  . Years: 37.00  . Pack years: 18.50  . Last attempt to quit: 03/30/1980  . Years since quitting: 38.0  Smokeless Tobacco Never Used   Counseling given: Not Answered  Continue to not smoke  Outpatient Encounter Medications as of 03/30/2018  Medication Sig  . aspirin EC 81 MG EC tablet Take 1 tablet (81 mg total) by mouth daily.  Marland Kitchen atorvastatin (LIPITOR) 40 MG tablet Take 1 tablet (40 mg total) by mouth daily at 6 PM.  . insulin NPH Human (HUMULIN N,NOVOLIN N) 100 UNIT/ML injection Inject 0.15 mLs (15 Units total) into the skin 2 (two) times daily before a meal.  . levothyroxine (SYNTHROID, LEVOTHROID) 25 MCG tablet Take 25 mcg by mouth daily.  Marland Kitchen lisinopril (PRINIVIL,ZESTRIL) 10 MG tablet Take 1 tablet (10 mg total) by mouth daily.  . metFORMIN (GLUCOPHAGE) 1000 MG tablet Take 1,000 mg by mouth 2 (two) times daily with a meal.    . prednisoLONE acetate (PRED FORTE) 1 % ophthalmic suspension Place 1 drop into both eyes 4 (four) times daily.   No facility-administered encounter medications on file as of 03/30/2018.    Review of Systems  Review of Systems  Constitutional: Negative for activity change, chills, fatigue, fever and unexpected weight change.  HENT: Negative for postnasal drip, rhinorrhea, sinus pressure, sinus pain, sneezing and sore throat.   Eyes: Negative.  Negative for visual disturbance.  Respiratory: Negative for cough, shortness of breath and wheezing.   Cardiovascular: Positive for leg swelling. Negative for chest pain and palpitations.  Gastrointestinal: Negative for constipation, diarrhea, nausea and vomiting.  Endocrine: Negative.   Musculoskeletal: Negative.   Skin: Negative.        Large mass on right scalp  Neurological: Negative for dizziness and headaches.  Psychiatric/Behavioral: Negative.  Negative for dysphoric mood. The patient is not nervous/anxious.   All other systems reviewed and are negative.    Physical Exam  BP (!) 90/50 (BP Location: Left Arm, Cuff Size: Normal) Comment: denies dizziness or lightheadedness  Pulse 64   Ht 6\' 2"  (1.88 m)   Wt 209 lb (94.8 kg)   SpO2 97%   BMI 26.83 kg/m   Wt Readings from Last 5 Encounters:  03/30/18 209 lb (94.8 kg)  03/20/18 198 lb 10.2 oz (90.1 kg)    Physical Exam  Constitutional: He is oriented to person, place, and time and well-developed, well-nourished, and in no distress. No distress.  HENT:  Head: Normocephalic and atraumatic.  Right Ear: Hearing and external ear normal.  Left Ear: Hearing and external ear normal.  Mouth/Throat: Uvula is midline and oropharynx is clear and moist. No oropharyngeal exudate.  Canals occluded with cerumen bilaterally   Eyes: Pupils are equal, round, and reactive to light.  Neck: Normal range of motion. Neck supple. No JVD present.  Cardiovascular: Normal rate, regular rhythm and normal heart sounds.  Pulmonary/Chest: Effort normal and breath sounds normal. No accessory muscle usage. No respiratory distress. He has no decreased breath sounds. He has no wheezes. He has no rhonchi. He has no rales.  Abdominal: Soft. Bowel sounds are normal. There is no tenderness.  Musculoskeletal: Normal range of motion. He exhibits edema (2+ LE bilaterally ).  Lymphadenopathy:    He has no cervical adenopathy.  Neurological: He is alert and oriented to person, place, and time. Gait normal.  Skin: Skin is warm and dry. He is not diaphoretic. No erythema.     Psychiatric: Mood, memory, affect and judgment normal.  Nursing note and vitals reviewed.     Lab Results:  CBC    Component Value Date/Time   WBC 8.0 03/18/2018 0320   RBC 5.33 03/18/2018 0320   HGB 15.1 03/18/2018 0320   HCT 48.4 03/18/2018 0320   PLT 201 03/18/2018 0320   MCV 90.8 03/18/2018  0320   MCH 28.3 03/18/2018 0320   MCHC 31.2 03/18/2018 0320   RDW 12.5 03/18/2018 0320   LYMPHSABS 1.5 03/13/2018 0057   MONOABS 0.8 03/13/2018 0057   EOSABS 0.2 03/13/2018 0057   BASOSABS 0.0 03/13/2018 0057    BMET    Component Value Date/Time   NA 137 03/18/2018 0320   K 3.8 03/18/2018 0320   CL 96 (L) 03/18/2018 0320   CO2 35 (H) 03/18/2018 0320   GLUCOSE 200 (H) 03/18/2018 0320   BUN 29 (H) 03/18/2018 0320   CREATININE 1.17 03/18/2018 0320   CALCIUM 10.0 03/18/2018 0320   GFRNONAA >60 03/18/2018 0320   GFRAA >60 03/18/2018 0320    BNP    Component Value Date/Time   BNP 34.2 03/14/2018 1614    ProBNP No results found for: PROBNP    Assessment & Plan:   Pleasant 72 year old male patient completing hospital follow-up today.  Patient has a multitude of test to be ordered: Pulmonary function test, BiPAP titration, high-res CT in January/2020, will place ambulatory referral to dermatology to see if we get patient establish sooner, will also ensure patient gets appointment with cardiology for follow-up post hospital.  Discussed with patient MRI of head findings as well as desperate need for histology on skin cluster on head.  After much discussion the patient agrees that he can proceed forward with an earlier appointment potentially with a PA or nurse practitioner if that gets him  in sooner.  We will place a different dermatology referral and work to get him in urgently for further evaluation as there is concerned that this may be cancer.  Patient to follow-up with our office in 6 to 8 weeks with Dr. Vassie Loll.   Pleural plaque  Pulmonary Function Test ordered   BiPAP titration ordered   High Res CT to be completed Jan/2020  Follow up with Dr. Vassie Loll in 6-8 weeks   Acute respiratory failure with hypoxia and hypercarbia (HCC) We recommend that you continue using your BIPAP daily >>>Keep up the hard work using your device >>> Goal should be wearing this for the entire  night that you are sleeping, at least 4 to 6 hours  Remember:  . Do not drive or operate heavy machinery if tired or drowsy.  . Please notify the supply company and office if you are unable to use your device regularly due to missing supplies or machine being broken.  . Work on maintaining a healthy weight and following your recommended nutrition plan  . Maintain proper daily exercise and movement  . Maintaining proper use of your device can also help improve management of other chronic illnesses such as: Blood pressure, blood sugars, and weight management.   BiPAP/ CPAP Cleaning:  >>>Clean weekly, with Dawn soap, and bottle brush.  Set up to air dry.    Pulmonary Function Test ordered   BiPAP titration ordered   High Res CT to be completed Jan/2020  Needs follow up with Cardiology  >>>follow low sodium diet  >>>wear Bipap >>>elevate legs  >>>weigh yourself daily in the morning, record weights  >>>wear compression stockings when able    Follow up with Dr. Vassie Loll in 6-8 weeks   Healthcare maintenance Keep follow up with PCP  >>>keep close follow up of blood sugars >>>consider nutritionist referral to help follow low sodium diet and diabetic diet   Dermatology order placed  >>>schedule appt ASAP   Needs follow up with Cardiology  >>>follow low sodium diet  >>>wear Bipap >>>elevate legs  >>>weigh yourself daily in the morning, record weights  >>>wear compression stockings when able   Follow up with Dr. Vassie Loll in 6-8 weeks   This appointment was 53 minutes along with her 50% of the time direct face-to-face patient care, assessment, plan of care follow-up, coordination of care and ensure the patient gets appropriate test and review of hospital discharge notes.   Coral Ceo, NP 03/30/2018

## 2018-03-30 ENCOUNTER — Ambulatory Visit: Payer: Medicare HMO | Admitting: Pulmonary Disease

## 2018-03-30 ENCOUNTER — Encounter: Payer: Self-pay | Admitting: Pulmonary Disease

## 2018-03-30 ENCOUNTER — Telehealth: Payer: Self-pay | Admitting: *Deleted

## 2018-03-30 VITALS — BP 90/50 | HR 64 | Ht 74.0 in | Wt 209.0 lb

## 2018-03-30 DIAGNOSIS — J929 Pleural plaque without asbestos: Secondary | ICD-10-CM | POA: Diagnosis not present

## 2018-03-30 DIAGNOSIS — Z77090 Contact with and (suspected) exposure to asbestos: Secondary | ICD-10-CM | POA: Diagnosis not present

## 2018-03-30 DIAGNOSIS — Z0000 Encounter for general adult medical examination without abnormal findings: Secondary | ICD-10-CM

## 2018-03-30 DIAGNOSIS — J9602 Acute respiratory failure with hypercapnia: Secondary | ICD-10-CM

## 2018-03-30 DIAGNOSIS — J9601 Acute respiratory failure with hypoxia: Secondary | ICD-10-CM | POA: Diagnosis not present

## 2018-03-30 NOTE — Assessment & Plan Note (Signed)
We recommend that you continue using your BIPAP daily >>>Keep up the hard work using your device >>> Goal should be wearing this for the entire night that you are sleeping, at least 4 to 6 hours  Remember:  . Do not drive or operate heavy machinery if tired or drowsy.  . Please notify the supply company and office if you are unable to use your device regularly due to missing supplies or machine being broken.  . Work on maintaining a healthy weight and following your recommended nutrition plan  . Maintain proper daily exercise and movement  . Maintaining proper use of your device can also help improve management of other chronic illnesses such as: Blood pressure, blood sugars, and weight management.   BiPAP/ CPAP Cleaning:  >>>Clean weekly, with Dawn soap, and bottle brush.  Set up to air dry.    Pulmonary Function Test ordered   BiPAP titration ordered   High Res CT to be completed Jan/2020  Needs follow up with Cardiology  >>>follow low sodium diet  >>>wear Bipap >>>elevate legs  >>>weigh yourself daily in the morning, record weights  >>>wear compression stockings when able    Follow up with Dr. Vassie Loll in 6-8 weeks

## 2018-03-30 NOTE — Telephone Encounter (Signed)
Thank you noted.   Robert Ramirez  

## 2018-03-30 NOTE — Assessment & Plan Note (Signed)
  Pulmonary Function Test ordered   BiPAP titration ordered   High Res CT to be completed Jan/2020  Follow up with Dr. Vassie Loll in 6-8 weeks

## 2018-03-30 NOTE — Telephone Encounter (Signed)
Called Dr. Jeannetta Nap office for vaccination records on patient, and was informed that patient transferred his records to [facility] in [place] in 2015.   Called [facility] office and was told they only had vaccination records for patient's pneumonia 13-August 26,2016.and flu this year for Oct. 2019.    Patient's chart updated  Will route to [place] as Lorain Childes

## 2018-03-30 NOTE — Assessment & Plan Note (Signed)
Keep follow up with PCP  >>>keep close follow up of blood sugars >>>consider nutritionist referral to help follow low sodium diet and diabetic diet   Dermatology order placed  >>>schedule appt ASAP   Needs follow up with Cardiology  >>>follow low sodium diet  >>>wear Bipap >>>elevate legs  >>>weigh yourself daily in the morning, record weights  >>>wear compression stockings when able   Follow up with Dr. Vassie Loll in 6-8 weeks

## 2018-03-30 NOTE — Patient Instructions (Addendum)
Keep follow up with PCP  >>>keep close follow up of blood sugars >>>consider nutritionist referral to help follow low sodium diet and diabetic diet   Dermatology order placed  >>>schedule appt ASAP   Pulmonary Function Test ordered   BiPAP titration ordered   High Res CT to be completed Jan/2020  Needs follow up with Cardiology  >>>follow low sodium diet  >>>wear Bipap >>>elevate legs  >>>weigh yourself daily in the morning, record weights  >>>wear compression stockings when able    Follow up with Dr. Vassie Loll in 6-8 weeks   FINANCIAL RESOURCES [facility] [phone]   Patient Financial Assistance [phone]      November/2019 we will be moving! We will no longer be at our [place] location.  Be on the look out for a post card/mailer to let you know we have officially moved.  Our new address and phone number will be:  [address] [place], [place] [address] Telephone number: [phone]  It is flu season:   >>>Remember to be washing your hands regularly, using hand sanitizer, be careful to use around herself with has contact with people who are sick will increase her chances of getting sick yourself. >>> Best ways to protect herself from the flu: Receive the yearly flu vaccine, practice good hand hygiene washing with soap and also using hand sanitizer when available, eat a nutritious meals, get adequate rest, hydrate appropriately   Please contact the office if your symptoms worsen or you have concerns that you are not improving.   Thank you for choosing [facility] for your healthcare, and for allowing [place] to partner with you on your healthcare journey. I am thankful to be able to provide care to you today.   Elisha Headland FNP-C

## 2018-03-31 ENCOUNTER — Encounter (INDEPENDENT_AMBULATORY_CARE_PROVIDER_SITE_OTHER): Payer: Medicare HMO | Admitting: Ophthalmology

## 2018-03-31 DIAGNOSIS — E11311 Type 2 diabetes mellitus with unspecified diabetic retinopathy with macular edema: Secondary | ICD-10-CM

## 2018-03-31 DIAGNOSIS — E113511 Type 2 diabetes mellitus with proliferative diabetic retinopathy with macular edema, right eye: Secondary | ICD-10-CM | POA: Diagnosis not present

## 2018-04-20 ENCOUNTER — Encounter (INDEPENDENT_AMBULATORY_CARE_PROVIDER_SITE_OTHER): Payer: Medicare HMO | Admitting: Ophthalmology

## 2018-04-20 DIAGNOSIS — E113513 Type 2 diabetes mellitus with proliferative diabetic retinopathy with macular edema, bilateral: Secondary | ICD-10-CM

## 2018-04-20 DIAGNOSIS — H35033 Hypertensive retinopathy, bilateral: Secondary | ICD-10-CM | POA: Diagnosis not present

## 2018-04-20 DIAGNOSIS — H2513 Age-related nuclear cataract, bilateral: Secondary | ICD-10-CM

## 2018-04-20 DIAGNOSIS — I10 Essential (primary) hypertension: Secondary | ICD-10-CM | POA: Diagnosis not present

## 2018-04-20 DIAGNOSIS — H43813 Vitreous degeneration, bilateral: Secondary | ICD-10-CM

## 2018-04-20 DIAGNOSIS — H4311 Vitreous hemorrhage, right eye: Secondary | ICD-10-CM

## 2018-04-20 DIAGNOSIS — E11311 Type 2 diabetes mellitus with unspecified diabetic retinopathy with macular edema: Secondary | ICD-10-CM

## 2018-05-12 ENCOUNTER — Ambulatory Visit (HOSPITAL_BASED_OUTPATIENT_CLINIC_OR_DEPARTMENT_OTHER): Payer: Medicare HMO | Attending: Pulmonary Disease | Admitting: Pulmonary Disease

## 2018-05-12 VITALS — Ht 74.0 in | Wt 200.0 lb

## 2018-05-12 DIAGNOSIS — J9602 Acute respiratory failure with hypercapnia: Secondary | ICD-10-CM

## 2018-05-12 DIAGNOSIS — G4733 Obstructive sleep apnea (adult) (pediatric): Secondary | ICD-10-CM | POA: Diagnosis not present

## 2018-05-12 DIAGNOSIS — J9610 Chronic respiratory failure, unspecified whether with hypoxia or hypercapnia: Secondary | ICD-10-CM | POA: Insufficient documentation

## 2018-05-12 DIAGNOSIS — J9601 Acute respiratory failure with hypoxia: Secondary | ICD-10-CM | POA: Diagnosis present

## 2018-05-12 DIAGNOSIS — G4730 Sleep apnea, unspecified: Secondary | ICD-10-CM | POA: Diagnosis not present

## 2018-05-12 DIAGNOSIS — I493 Ventricular premature depolarization: Secondary | ICD-10-CM | POA: Diagnosis not present

## 2018-05-13 ENCOUNTER — Ambulatory Visit: Payer: Medicare HMO | Admitting: Pulmonary Disease

## 2018-05-14 ENCOUNTER — Telehealth: Payer: Self-pay | Admitting: Pulmonary Disease

## 2018-05-14 NOTE — Telephone Encounter (Signed)
Called and spoke with patient, he stated that he didn't remember his appointment that he had for the PFT and OV with RA that was scheduled for 05/14/18. He does has an appointment on 06/08/17 for a CT scan. Patient wants to know if he needs to come in for another PFT and OV before the CT scan or wait until after.   RA please advise, thank you.

## 2018-05-17 DIAGNOSIS — G4733 Obstructive sleep apnea (adult) (pediatric): Secondary | ICD-10-CM

## 2018-05-17 NOTE — Progress Notes (Signed)
Robert Ramirez can we call the patient to discuss the results of the BiPAP titration:   - Recommend a trial of BiPAP 11/7  cm H2O withmedium mirage quattro full face mask - Avoid alcohol, sedatives and other CNS depressants that may worsen sleep apnea and disrupt normal sleep architecture. - Sleep hygiene should be reviewed to assess factors that may improve sleep quality. - Weight management and regular exercise should be initiated or continued. - Return to Sleep Center for re-evaluation after 4 weeks of therapy   Can we ensure the patients DME has the correct settings listed/ bolded above. Pt can review changes with Dr. Alva with upcoming schedule.   Nyia Tsao FNP  

## 2018-05-17 NOTE — Procedures (Signed)
Patient Name: Robert Ramirez, Dalin Study Date: 05/12/2018 Gender: Male D.O.B: June 16, 1945 Age (years): [age] Referring Provider: Coral CeoBrian P Mack NP Height (inches): 74 Interpreting Physician: Cyril Mourningakesh Alva MD, ABSM Weight (lbs): 200 RPSGT: Lowry RamMckinney, Takeya BMI: 26 MRN: [id number] Neck Size: 15.50 <br> <br>  CLINICAL INFORMATION The patient is referred for a BiPAP titration to treat sleep apnea & chronic respiratory failure.    SLEEP STUDY TECHNIQUE As per the AASM Manual for the Scoring of Sleep and Associated Events v2.3 (April 2016) with a hypopnea requiring 4% desaturations.  The channels recorded and monitored were frontal, central and occipital EEG, electrooculogram (EOG), submentalis EMG (chin), nasal and oral airflow, thoracic and abdominal wall motion, anterior tibialis EMG, snore microphone, electrocardiogram, and pulse oximetry. Bilevel positive airway pressure (BPAP) was initiated at the beginning of the study and titrated to treat sleep-disordered breathing.  MEDICATIONS Medications self-administered by patient taken the night of the study : N/A  RESPIRATORY PARAMETERS Optimal IPAP Pressure (cm): 11 AHI at Optimal Pressure (/hr) 0 Optimal EPAP Pressure (cm): 7   Overall Minimal O2 (%): 79.0 Minimal O2 at Optimal Pressure (%): 86 SLEEP ARCHITECTURE Start Time: 10:31:43 PM Stop Time: 5:31:27 AM Total Time (min): 419.7 Total Sleep Time (min): 275.5 Sleep Latency (min): 103.4 Sleep Efficiency (%): 65.6% REM Latency (min): 7.0 WASO (min): 40.9 Stage N1 (%): 6.7% Stage N2 (%): 59.0% Stage N3 (%): 0.0% Stage R (%): 34.3 Supine (%): 100.00 Arousal Index (/hr): 14.4     CARDIAC DATA The 2 lead EKG demonstrated sinus rhythm. The mean heart rate was 62.7 beats per minute. Other EKG findings include: PVCs.   LEG MOVEMENT DATA The total Periodic Limb Movements of Sleep (PLMS) were 0. The PLMS index was 0.0. A PLMS index of <15 is considered normal in adults.  IMPRESSIONS - An  optimal biPAP pressure of 11/7 cm was selected for this patient based on the available study data. - Central sleep apnea was not noted during this titration (CAI = 0.0/h). - Severe oxygen desaturations were observed during this titration (min O2 = 79.0%). - No snoring was audible during this study. - 2-lead EKG demonstrated: PVCs - Clinically significant periodic limb movements were not noted during this study. Arousals associated with PLMs were rare.  DIAGNOSIS - Obstructive Sleep Apnea (327.23 [G47.33 ICD-10]) - Chronic respiratory failure   RECOMMENDATIONS - Recommend a trial of BiPAP 11/7  cm H2O withmedium mirage quattro full face mask - Avoid alcohol, sedatives and other CNS depressants that may worsen sleep apnea and disrupt normal sleep architecture. - Sleep hygiene should be reviewed to assess factors that may improve sleep quality. - Weight management and regular exercise should be initiated or continued. - Return to Sleep Center for re-evaluation after 4 weeks of therapy    Cyril Mourningakesh Alva MD Board Certified in Sleep medicine

## 2018-05-17 NOTE — Progress Notes (Signed)
Lauren can we call the patient to discuss the results of the BiPAP titration:   - Recommend a trial of BiPAP 11/7  cm H2O withmedium mirage quattro full face mask - Avoid alcohol, sedatives and other CNS depressants that may worsen sleep apnea and disrupt normal sleep architecture. - Sleep hygiene should be reviewed to assess factors that may improve sleep quality. - Weight management and regular exercise should be initiated or continued. - Return to Sleep Center for re-evaluation after 4 weeks of therapy   Can we ensure the patients DME has the correct settings listed/ bolded above. Pt can review changes with Dr. Vassie LollAlva with upcoming schedule.   Elisha HeadlandBrian Myangel Summons FNP

## 2018-05-17 NOTE — Telephone Encounter (Signed)
Spoke with pt. He has been scheduled for a PFT on 06/09/18 at 3pm and he will see RA the same day at 4pm. Nothing further was needed at this time.

## 2018-05-17 NOTE — Telephone Encounter (Signed)
Please try to arrange for PFTs and office visit can be after both PFTs and CT scan has been done

## 2018-05-18 ENCOUNTER — Telehealth: Payer: Self-pay | Admitting: *Deleted

## 2018-05-18 NOTE — Telephone Encounter (Deleted)
-----   Message from Coral CeoBrian P Mack, NP sent at 05/17/2018  1:35 PM EST ----- Leotis ShamesLauren can we call the patient to discuss the results of the BiPAP titration:   - Recommend a trial of BiPAP 11/7 cm H2O withmedium mirage quattro full face mask - Avoid alcohol, sedatives and other CNS depressants that may worsen sleep apnea and disrupt normal sleep architecture. - Sleep hygiene should be reviewed to assess factors that may improve sleep quality. - Weight management and regular exercise should be initiated or continued. - Return to Sleep Center for re-evaluation after 4 weeks of therapy   Can we ensure the patients DME has the correct settings listed/ bolded above. Pt can review changes with Dr. Vassie LollAlva with upcoming schedule.   Elisha HeadlandBrian Mack FNP

## 2018-05-18 NOTE — Telephone Encounter (Signed)
Called and spoke with patient about results and he verified DME company is [facility].   Called [facility] and spoke with Verlon AuLeslie , I let her know of the new settings "Recommend a trial of BiPAP 11/7 cm H2O withmedium mirage quattro full face mask" Verlon AuLeslie states the patient is on a Trilegy Vent. She is going to fax over current settings to see if anything from these settings will need to be changed.   Will leave message open in box for after fax received.

## 2018-06-01 ENCOUNTER — Encounter (INDEPENDENT_AMBULATORY_CARE_PROVIDER_SITE_OTHER): Payer: Medicare HMO | Admitting: Ophthalmology

## 2018-06-01 DIAGNOSIS — E11311 Type 2 diabetes mellitus with unspecified diabetic retinopathy with macular edema: Secondary | ICD-10-CM

## 2018-06-01 DIAGNOSIS — I10 Essential (primary) hypertension: Secondary | ICD-10-CM | POA: Diagnosis not present

## 2018-06-01 DIAGNOSIS — E113513 Type 2 diabetes mellitus with proliferative diabetic retinopathy with macular edema, bilateral: Secondary | ICD-10-CM

## 2018-06-01 DIAGNOSIS — H35033 Hypertensive retinopathy, bilateral: Secondary | ICD-10-CM

## 2018-06-01 DIAGNOSIS — H2513 Age-related nuclear cataract, bilateral: Secondary | ICD-10-CM

## 2018-06-01 DIAGNOSIS — H43813 Vitreous degeneration, bilateral: Secondary | ICD-10-CM

## 2018-06-04 ENCOUNTER — Other Ambulatory Visit: Payer: Medicare HMO

## 2018-06-08 ENCOUNTER — Ambulatory Visit (INDEPENDENT_AMBULATORY_CARE_PROVIDER_SITE_OTHER)
Admission: RE | Admit: 2018-06-08 | Discharge: 2018-06-08 | Disposition: A | Discharge status: Home / Self Care | Payer: Medicare HMO | Source: Ambulatory Visit | Attending: Pulmonary Disease | Admitting: Pulmonary Disease

## 2018-06-08 DIAGNOSIS — J929 Pleural plaque without asbestos: Secondary | ICD-10-CM

## 2018-06-08 DIAGNOSIS — Z77090 Contact with and (suspected) exposure to asbestos: Secondary | ICD-10-CM

## 2018-06-09 ENCOUNTER — Ambulatory Visit (INDEPENDENT_AMBULATORY_CARE_PROVIDER_SITE_OTHER): Payer: Medicare HMO | Admitting: Pulmonary Disease

## 2018-06-09 ENCOUNTER — Ambulatory Visit: Payer: Medicare HMO | Admitting: Pulmonary Disease

## 2018-06-09 ENCOUNTER — Encounter: Payer: Self-pay | Admitting: Pulmonary Disease

## 2018-06-09 DIAGNOSIS — J9612 Chronic respiratory failure with hypercapnia: Secondary | ICD-10-CM

## 2018-06-09 DIAGNOSIS — J929 Pleural plaque without asbestos: Secondary | ICD-10-CM | POA: Diagnosis not present

## 2018-06-09 DIAGNOSIS — J4489 Other specified chronic obstructive pulmonary disease: Secondary | ICD-10-CM | POA: Insufficient documentation

## 2018-06-09 DIAGNOSIS — I2729 Other secondary pulmonary hypertension: Secondary | ICD-10-CM | POA: Diagnosis not present

## 2018-06-09 DIAGNOSIS — Z77090 Contact with and (suspected) exposure to asbestos: Secondary | ICD-10-CM

## 2018-06-09 DIAGNOSIS — J449 Chronic obstructive pulmonary disease, unspecified: Secondary | ICD-10-CM | POA: Diagnosis not present

## 2018-06-09 DIAGNOSIS — J9611 Chronic respiratory failure with hypoxia: Secondary | ICD-10-CM | POA: Diagnosis not present

## 2018-06-09 DIAGNOSIS — J9602 Acute respiratory failure with hypercapnia: Secondary | ICD-10-CM

## 2018-06-09 DIAGNOSIS — J9601 Acute respiratory failure with hypoxia: Secondary | ICD-10-CM

## 2018-06-09 LAB — PULMONARY FUNCTION TEST
DL/VA % pred: 105 %
DL/VA: 5 ml/min/mmHg/L
DLCO unc % pred: 60 %
DLCO unc: 21.92 ml/min/mmHg
FEF 25-75 PRE: 0.75 L/s
FEF 25-75 Post: 1.15 L/sec
FEF2575-%Change-Post: 52 %
FEF2575-%PRED-PRE: 27 %
FEF2575-%Pred-Post: 42 %
FEV1-%Change-Post: 13 %
FEV1-%PRED-POST: 51 %
FEV1-%Pred-Pre: 45 %
FEV1-POST: 1.84 L
FEV1-PRE: 1.63 L
FEV1FVC-%Change-Post: 11 %
FEV1FVC-%Pred-Pre: 84 %
FEV6-%CHANGE-POST: 4 %
FEV6-%PRED-POST: 57 %
FEV6-%PRED-PRE: 54 %
FEV6-PRE: 2.54 L
FEV6-Post: 2.65 L
FEV6FVC-%CHANGE-POST: 2 %
FEV6FVC-%PRED-PRE: 102 %
FEV6FVC-%Pred-Post: 105 %
FVC-%CHANGE-POST: 1 %
FVC-%Pred-Post: 54 %
FVC-%Pred-Pre: 53 %
FVC-Post: 2.67 L
FVC-Pre: 2.63 L
POST FEV6/FVC RATIO: 99 %
PRE FEV1/FVC RATIO: 62 %
Post FEV1/FVC ratio: 69 %
Pre FEV6/FVC Ratio: 97 %
RV % PRED: 93 %
RV: 2.46 L
TLC % pred: 66 %
TLC: 5.08 L

## 2018-06-09 NOTE — Patient Instructions (Signed)
You have chronic elevated high carbon dioxide level that has caused increased pressure buildup in your heart. Your scan showed thickening of the covering of the lung/pleura with calcification that is consistent with your exposure to asbestos  Oxygen levels have improved, discontinue oxygen. Discontinue trilogy machine. Instead we will prescribe BiPAP 11/7 cm, continue using a medium full facemask  Prescription for albuterol MDI 2 puffs every 6 hours as needed for wheezing or shortness of breath

## 2018-06-09 NOTE — Progress Notes (Signed)
   Subjective:    Patient ID: Robert Ramirez, male    DOB: 10/29/1945, 73 y.o.   MRN: [id number]  HPI  73 year old remote smoker admitted 02/2018 with acute on chronic hypercarbic respiratory failure. ABG showed compensated hypercarbia. CT chest shows bilateral small effusions and pleural plaques and calcified lymphadenopathy suggestive of old granulomatous disease. 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 He quit smoking more than 35 years ago and smoked less than 10 pack years.  He was discharged on trilogy machine and had subsequent BiPAP titration study. We also discussed CT scan and PFT reports today.  He has done very well, breathing has improved, oxygen saturation is 100% on room air and he is come off oxygen and daytime continues to use this at night. He uses a full facemask and is very compliant, he feels that trilogy machine has helped but this is very expensive he has to pay $800 every month of which is out-of-pocket expenses $200 which he cannot afford  He denies childhood history of asthma, frequent wheezing or chest colds  Significant tests/ events reviewed  Echo 02/2018  shows D-shaped septum and RV dilatation suggestive of severe pulmonary hypertension and cor pulmonale. .  Cardiac cath 02/2018 LVEDP normal  PFTs 05/2018 reversible airway obstruction, ratio 62, FEV1 45% improved to 51%, 13% bronchodilator response, TLC 66% and DLCO 60% also suggesting moderate restriction.  HRCT 05/2018 did not show any ILD , showed calcified pleural plaques   Past Medical History:  Diagnosis Date  . Diabetes mellitus   . High cholesterol   . Hypertension      Review of Systems neg for any significant sore throat, dysphagia, itching, sneezing, nasal congestion or excess/ purulent secretions, fever, chills, sweats, unintended wt loss, pleuritic or exertional cp, hempoptysis, orthopnea pnd or change in chronic leg swelling. Also denies  presyncope, palpitations, heartburn, abdominal pain, nausea, vomiting, diarrhea or change in bowel or urinary habits, dysuria,hematuria, rash, arthralgias, visual complaints, headache, numbness weakness or ataxia.     Objective:   Physical Exam  Gen. Pleasant, well-nourished, in no distress, normal affect ENT - no pallor,icterus, no post nasal drip , vascular ? Nevus behind Rt ear Neck: No JVD, no thyromegaly, no carotid bruits Lungs: no use of accessory muscles, no dullness to percussion, decreased on RT  without rales or rhonchi  Cardiovascular: Rhythm regular, heart sounds  normal, no murmurs or gallops, 1+  peripheral edema Abdomen: soft and non-tender, no hepatosplenomegaly, BS normal. Musculoskeletal: No deformities, no cyanosis or clubbing Neuro:  alert, non focal        Assessment & Plan:

## 2018-06-09 NOTE — Assessment & Plan Note (Signed)
Ideally needs repeat echo now that Hypoxia is resolved and hypercarbia is presumably improved on nocturnal ventilation But he would like to avoid unnecessary expense, hence we will defer

## 2018-06-09 NOTE — Progress Notes (Signed)
PFT done today. 

## 2018-06-09 NOTE — Assessment & Plan Note (Signed)
Oxygen levels have improved, discontinue oxygen. Discontinue trilogy machine. Instead we will prescribe BiPAP 11/7 cm, continue using a medium full facemask

## 2018-06-09 NOTE — Assessment & Plan Note (Signed)
Reversibility on PFTs suggests chronic obstructive asthma although he does not give a general history of asthma.  If history of smoking is not substantial. CT does not show any evidence of ILD. He wants to avoid unnecessary medications and would not take a maintenance inhaler.  Hence we will just give him albuterol to use on an as-needed basis for now

## 2018-06-09 NOTE — Assessment & Plan Note (Signed)
HRCT shows calcified pleural plaques consistent with known exposure to asbestos

## 2018-06-17 NOTE — Progress Notes (Signed)
Discussed in OV with Dr. Vassie Loll.   Elisha Headland FNP

## 2018-06-17 NOTE — Progress Notes (Signed)
Discussed in OV with Dr. Alva.   Brian Mack FNP

## 2018-06-22 NOTE — Telephone Encounter (Signed)
Spoke with Robert Ramirez and per him it is ok to close encounter as this has been taken care of by RA.

## 2018-07-02 ENCOUNTER — Telehealth: Payer: Self-pay | Admitting: Pulmonary Disease

## 2018-07-02 DIAGNOSIS — J449 Chronic obstructive pulmonary disease, unspecified: Secondary | ICD-10-CM

## 2018-07-02 DIAGNOSIS — I2729 Other secondary pulmonary hypertension: Secondary | ICD-10-CM

## 2018-07-05 ENCOUNTER — Encounter (INDEPENDENT_AMBULATORY_CARE_PROVIDER_SITE_OTHER): Payer: Medicare HMO | Admitting: Ophthalmology

## 2018-07-05 NOTE — Telephone Encounter (Signed)
Per 06/09/18 OV with Dr Vassie Loll- Oxygen levels have improved, discontinue oxygen. Discontinue trilogy machine. Instead we will prescribe BiPAP 11/7 cm, continue using a medium full facemask  Order placed for bipap per 06/09/18 OV.  Patient aware that order has been placed and someone will contact him shortly.  Nothing further at this time.

## 2018-07-09 ENCOUNTER — Telehealth: Payer: Self-pay | Admitting: Pulmonary Disease

## 2018-07-09 NOTE — Telephone Encounter (Signed)
Spoke with Robert Ramirez from [facility]. He stated that the patient had been calling their office due to the fact that the Triology machine had not been picked up yet. Robert Ramirez stated that if the patient wanted to keep the Trilogy machine, they would be willing to set patient up with patient assistance.   Reviewed patient's chart, there is already an order for the triology to be picked up. Spoke with patient, Trilogy is in the process of being picked up today. However, they did not bring a bipap machine. Advised patient I would check on the status of this for him, he verbalized understanding.   Left a message for Robert Ramirez at [facility] to see why a bipap machine was not delivered to the patient today.

## 2018-07-13 NOTE — Telephone Encounter (Signed)
Faxed order & sleep study for bipap to [facility].

## 2018-07-13 NOTE — Telephone Encounter (Signed)
Called [facility] and spoke with [name], states that Trilogy was picked up on 2/14 AMA to return O2 and Trilogy.  States that Bipap order was not properly received- states that they only received a telephone encounter stating that pt needs to be switched to a bipap.  They did not receive the order, or the sleep study.  This needs to be refaxed ASAP.  Spoke with pt and wife, aware that we are working on this order.    PCCs please advise.  Thanks.

## 2018-07-13 NOTE — Telephone Encounter (Signed)
Pt is calling back [phone] Pt want's to know the status of the Bipap machine

## 2018-07-13 NOTE — Telephone Encounter (Signed)
Received fax confirmation

## 2018-07-14 ENCOUNTER — Telehealth: Payer: Self-pay | Admitting: Pulmonary Disease

## 2018-07-14 NOTE — Telephone Encounter (Signed)
Patient has not heard from [facility] in regards to his Bipap I have tried to call [facility] but they are closed so I will hold the message in triage and call them tomorrow.

## 2018-07-15 NOTE — Telephone Encounter (Signed)
Robert Ramirez, spoke with [name]. She stated that bipap was still pending insurance. She stated that she would follow up with Patient.  Nothing further at this time.

## 2018-09-08 ENCOUNTER — Ambulatory Visit: Payer: Medicare HMO | Admitting: Pulmonary Disease

## 2018-11-18 DIAGNOSIS — J9611 Chronic respiratory failure with hypoxia: Secondary | ICD-10-CM

## 2018-11-18 DIAGNOSIS — J9612 Chronic respiratory failure with hypercapnia: Secondary | ICD-10-CM

## 2018-11-21 DIAGNOSIS — E210 Primary hyperparathyroidism: Secondary | ICD-10-CM | POA: Insufficient documentation

## 2019-01-18 IMAGING — CT CT CHEST W/O CM
2 of 4 series · 15 of 36 positions shown, 18 images · non-contrast
Comparison: Radiographs of same day.

CLINICAL DATA: Shortness of breath.

EXAM:
CT CHEST WITHOUT CONTRAST
TECHNIQUE: Multidetector CT imaging of the chest was performed following the
standard protocol without IV contrast.

[Series 5: thorax 2.0 · axial · 0.94mm/px · z∈[+1269,+1521]mm · 12 of 148 slices shown, 15 images]
[im 11/148  mediastinal]
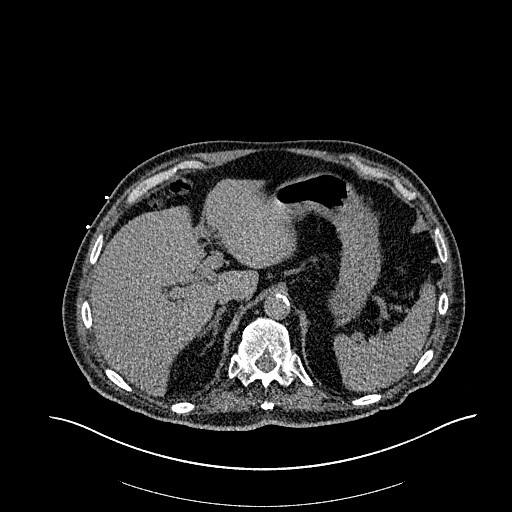
[im 11/148  lung]
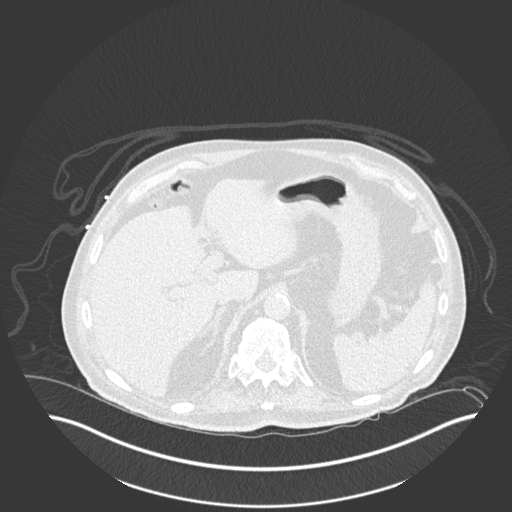
[im 22/148  lung]
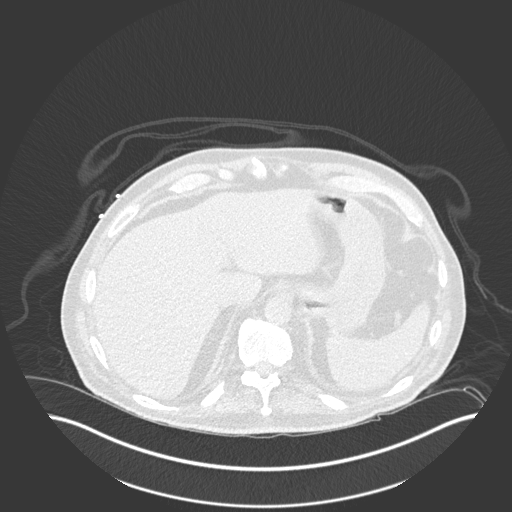
[im 32/148  lung]
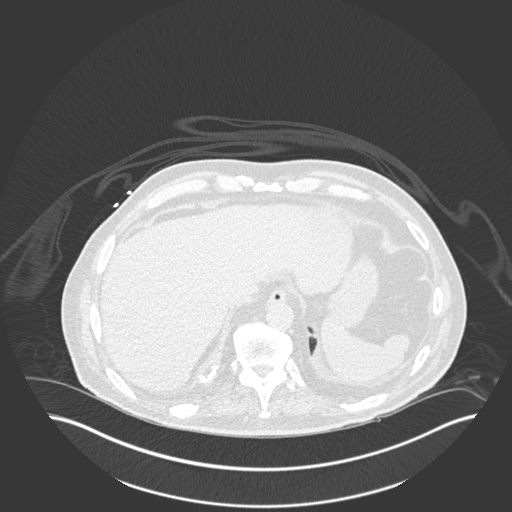
[im 43/148  lung]
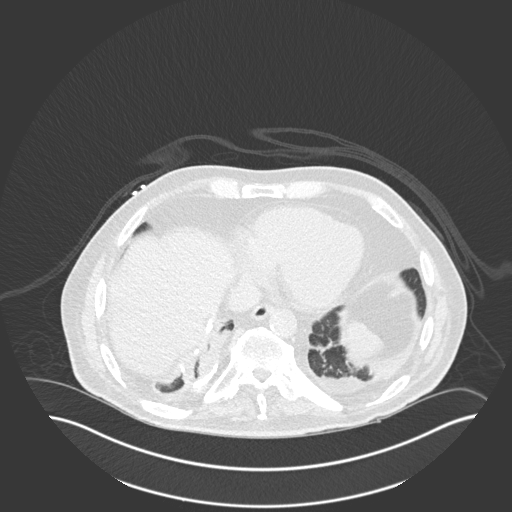
[im 53/148  mediastinal]
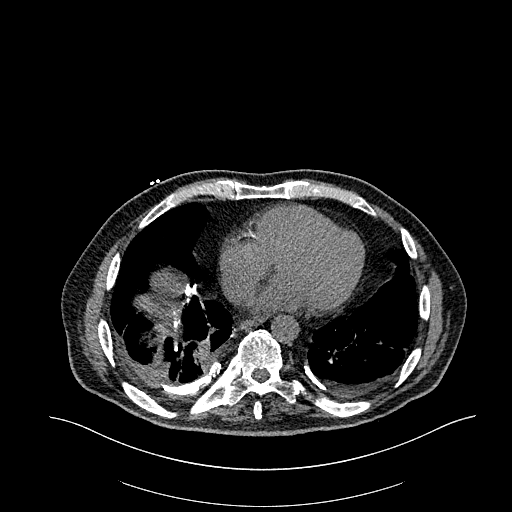
[im 53/148  lung]
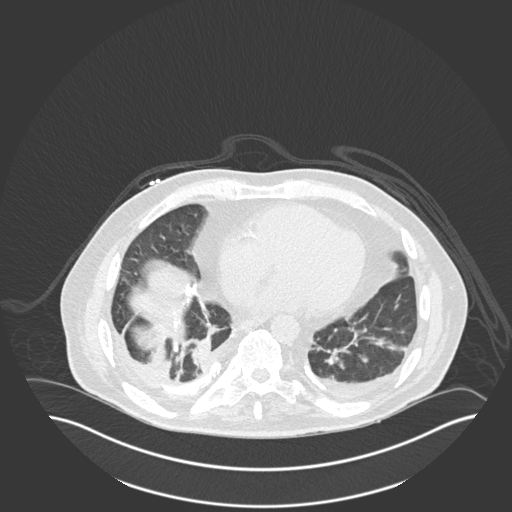
[im 64/148  lung]
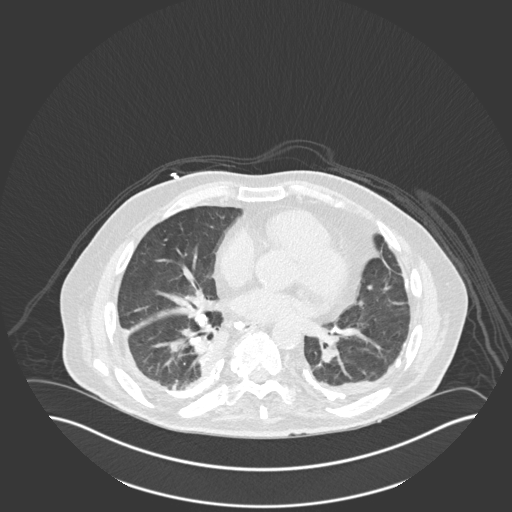
[im 85/148  lung]
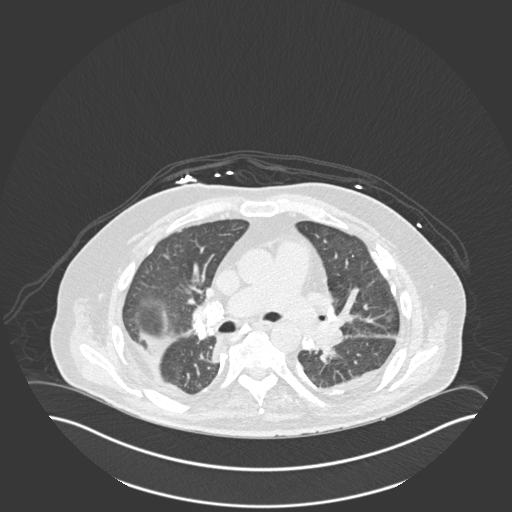
[im 95/148  lung]
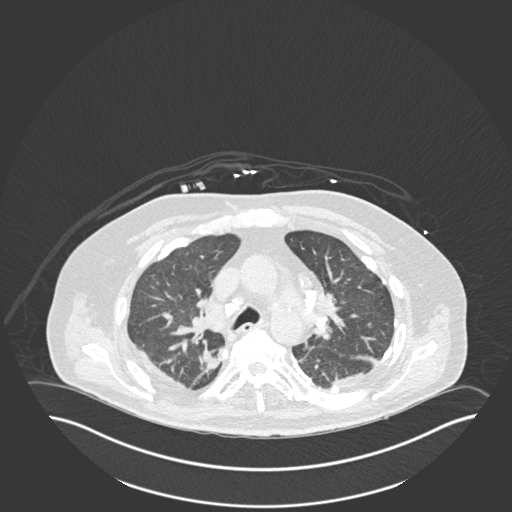
[im 106/148  mediastinal]
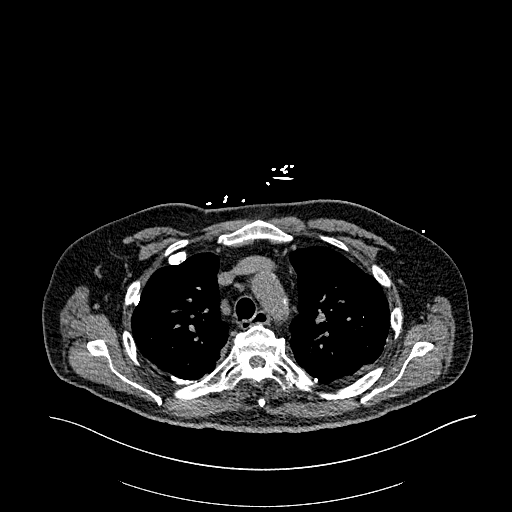
[im 106/148  lung]
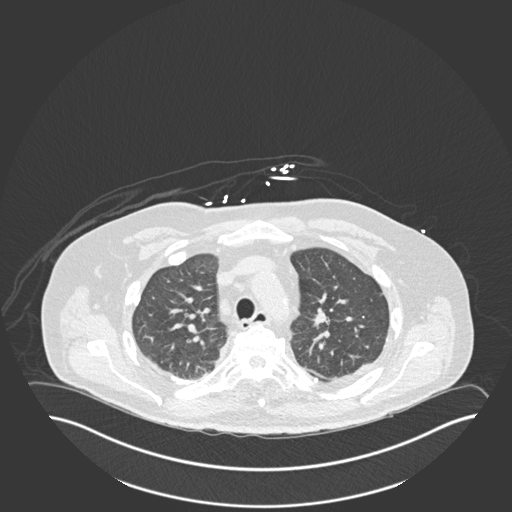
[im 116/148  lung]
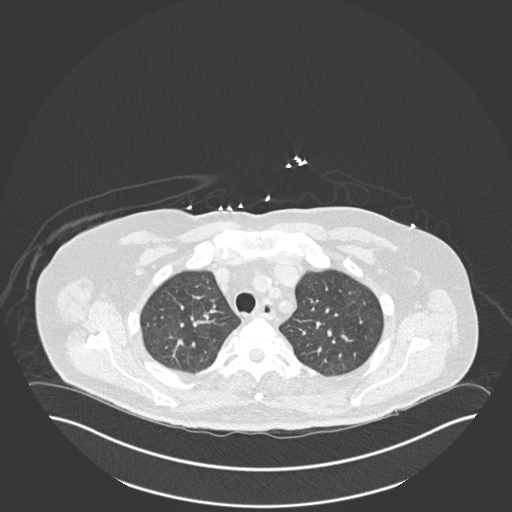
[im 127/148  lung]
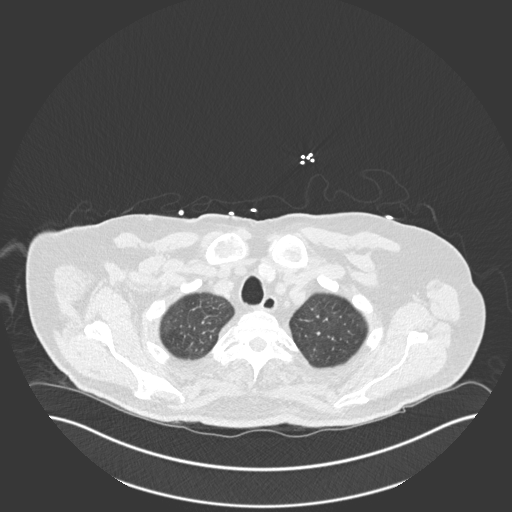
[im 137/148  lung]
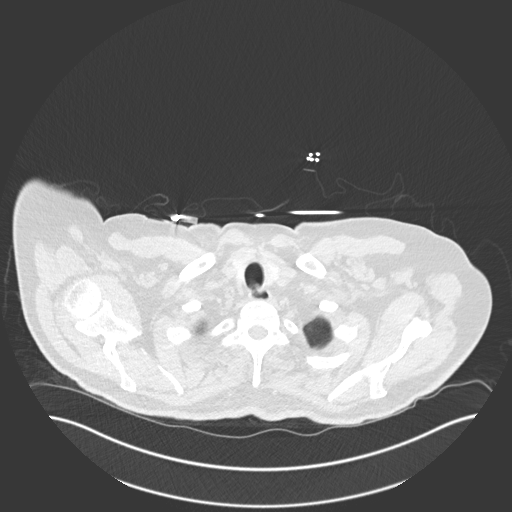

[Series 9: coronal · coronal · 0.59mm/px · 3 of 101 slices shown]
[im 21/101  lung]
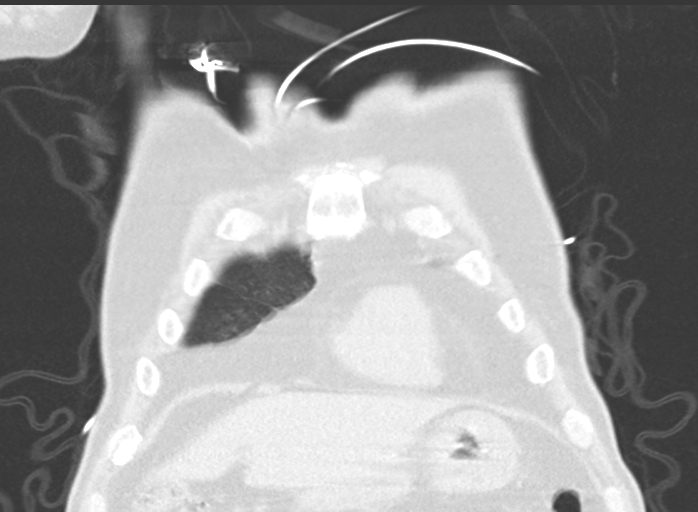
[im 41/101  lung]
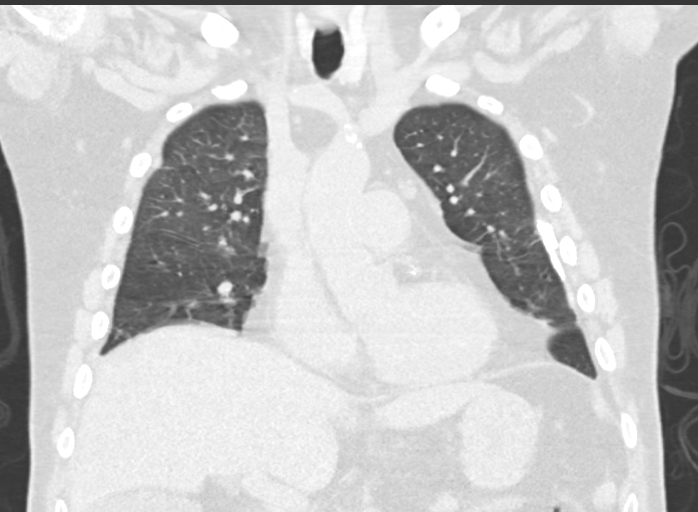
[im 61/101  lung]
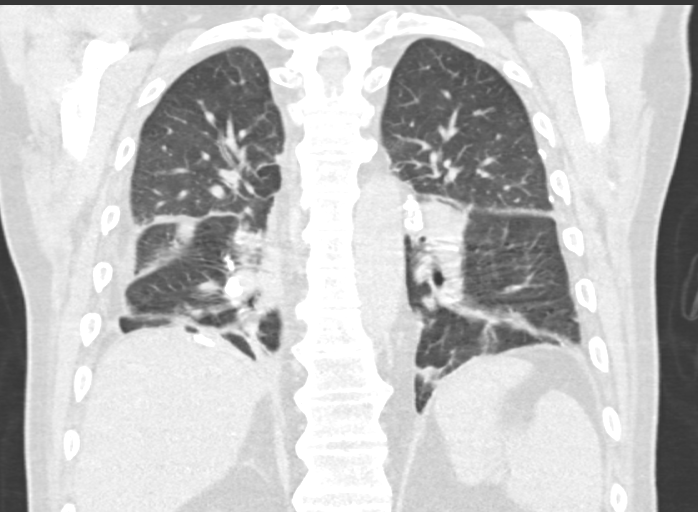

[15 of 36 positions shown; findings below may reference images not displayed]

FINDINGS: Cardiovascular: Atherosclerosis of thoracic aorta is noted without
aneurysm formation. Normal cardiac size. Mild coronary artery
calcifications are noted. No pericardial effusion is noted.

Mediastinum/Nodes: Thyroid gland and esophagus are unremarkable.
Calcified lymph nodes are noted in the mediastinum most consistent
with prior granulomatous disease.

Lungs/Pleura: No pneumothorax is noted. Calcified pleural plaques
are noted bilaterally consistent with asbestos exposure. Mild
bibasilar subsegmental atelectasis or scarring is noted. Significant
pleural thickening is noted in medial portion of right lower lobe
which may represent scarring, but possible mass cannot be excluded.

Upper Abdomen: No acute abnormality.

Musculoskeletal: No chest wall mass or suspicious bone lesions
identified.
IMPRESSION: Calcified pleural plaques are noted bilaterally consistent with
asbestos exposure.

Significant pleural thickening is noted along the medial portion of
right lower lobe which most likely represent scarring, but possible
neoplasm cannot be excluded. Follow-up CT scan of the chest in 3
months is recommended to ensure stability.

Calcified mediastinal lymph nodes are noted most consistent with
prior granulomatous disease.

Mild bibasilar subsegmental atelectasis or scarring is noted.

Aortic aneurysm NOS ([id number]-ITR.7).

## 2019-04-05 DIAGNOSIS — I10 Essential (primary) hypertension: Secondary | ICD-10-CM | POA: Insufficient documentation

## 2019-04-05 DIAGNOSIS — Z720 Tobacco use: Secondary | ICD-10-CM | POA: Insufficient documentation

## 2019-04-05 DIAGNOSIS — H902 Conductive hearing loss, unspecified: Secondary | ICD-10-CM | POA: Insufficient documentation

## 2019-04-05 DIAGNOSIS — H6123 Impacted cerumen, bilateral: Secondary | ICD-10-CM | POA: Insufficient documentation

## 2019-12-23 ENCOUNTER — Other Ambulatory Visit: Payer: Self-pay

## 2019-12-23 ENCOUNTER — Encounter (INDEPENDENT_AMBULATORY_CARE_PROVIDER_SITE_OTHER): Payer: Medicare HMO | Admitting: Ophthalmology

## 2019-12-23 DIAGNOSIS — I10 Essential (primary) hypertension: Secondary | ICD-10-CM | POA: Diagnosis not present

## 2019-12-23 DIAGNOSIS — E113513 Type 2 diabetes mellitus with proliferative diabetic retinopathy with macular edema, bilateral: Secondary | ICD-10-CM | POA: Diagnosis not present

## 2019-12-23 DIAGNOSIS — E11311 Type 2 diabetes mellitus with unspecified diabetic retinopathy with macular edema: Secondary | ICD-10-CM | POA: Diagnosis not present

## 2019-12-23 DIAGNOSIS — H4311 Vitreous hemorrhage, right eye: Secondary | ICD-10-CM

## 2019-12-23 DIAGNOSIS — H43813 Vitreous degeneration, bilateral: Secondary | ICD-10-CM

## 2019-12-23 DIAGNOSIS — H35033 Hypertensive retinopathy, bilateral: Secondary | ICD-10-CM

## 2020-01-20 ENCOUNTER — Encounter (INDEPENDENT_AMBULATORY_CARE_PROVIDER_SITE_OTHER): Payer: Medicare HMO | Admitting: Ophthalmology

## 2020-01-20 ENCOUNTER — Other Ambulatory Visit: Payer: Self-pay

## 2020-01-20 DIAGNOSIS — E113513 Type 2 diabetes mellitus with proliferative diabetic retinopathy with macular edema, bilateral: Secondary | ICD-10-CM

## 2020-01-20 DIAGNOSIS — I10 Essential (primary) hypertension: Secondary | ICD-10-CM

## 2020-01-20 DIAGNOSIS — E11311 Type 2 diabetes mellitus with unspecified diabetic retinopathy with macular edema: Secondary | ICD-10-CM

## 2020-01-20 DIAGNOSIS — H35033 Hypertensive retinopathy, bilateral: Secondary | ICD-10-CM

## 2020-01-20 DIAGNOSIS — H4311 Vitreous hemorrhage, right eye: Secondary | ICD-10-CM

## 2020-02-17 ENCOUNTER — Encounter (INDEPENDENT_AMBULATORY_CARE_PROVIDER_SITE_OTHER): Payer: Medicare HMO | Admitting: Ophthalmology

## 2020-02-17 ENCOUNTER — Other Ambulatory Visit: Payer: Self-pay

## 2020-02-17 DIAGNOSIS — I10 Essential (primary) hypertension: Secondary | ICD-10-CM | POA: Diagnosis not present

## 2020-02-17 DIAGNOSIS — H4311 Vitreous hemorrhage, right eye: Secondary | ICD-10-CM

## 2020-02-17 DIAGNOSIS — E11311 Type 2 diabetes mellitus with unspecified diabetic retinopathy with macular edema: Secondary | ICD-10-CM | POA: Diagnosis not present

## 2020-02-17 DIAGNOSIS — E113513 Type 2 diabetes mellitus with proliferative diabetic retinopathy with macular edema, bilateral: Secondary | ICD-10-CM | POA: Diagnosis not present

## 2020-02-17 DIAGNOSIS — H43813 Vitreous degeneration, bilateral: Secondary | ICD-10-CM

## 2020-02-17 DIAGNOSIS — H35033 Hypertensive retinopathy, bilateral: Secondary | ICD-10-CM

## 2020-03-14 ENCOUNTER — Other Ambulatory Visit: Payer: Self-pay

## 2020-03-14 ENCOUNTER — Encounter (INDEPENDENT_AMBULATORY_CARE_PROVIDER_SITE_OTHER): Payer: Medicare HMO | Admitting: Ophthalmology

## 2020-03-14 DIAGNOSIS — H35371 Puckering of macula, right eye: Secondary | ICD-10-CM

## 2020-03-14 DIAGNOSIS — H43813 Vitreous degeneration, bilateral: Secondary | ICD-10-CM

## 2020-03-14 DIAGNOSIS — H2513 Age-related nuclear cataract, bilateral: Secondary | ICD-10-CM

## 2020-03-14 DIAGNOSIS — E11311 Type 2 diabetes mellitus with unspecified diabetic retinopathy with macular edema: Secondary | ICD-10-CM | POA: Diagnosis not present

## 2020-03-14 DIAGNOSIS — E113513 Type 2 diabetes mellitus with proliferative diabetic retinopathy with macular edema, bilateral: Secondary | ICD-10-CM

## 2020-03-14 DIAGNOSIS — H4311 Vitreous hemorrhage, right eye: Secondary | ICD-10-CM

## 2020-03-14 DIAGNOSIS — H35033 Hypertensive retinopathy, bilateral: Secondary | ICD-10-CM | POA: Diagnosis not present

## 2020-03-14 DIAGNOSIS — I10 Essential (primary) hypertension: Secondary | ICD-10-CM

## 2020-03-23 ENCOUNTER — Other Ambulatory Visit: Payer: Self-pay

## 2020-03-23 ENCOUNTER — Encounter (INDEPENDENT_AMBULATORY_CARE_PROVIDER_SITE_OTHER): Payer: Medicare HMO | Admitting: Ophthalmology

## 2020-03-23 DIAGNOSIS — E11311 Type 2 diabetes mellitus with unspecified diabetic retinopathy with macular edema: Secondary | ICD-10-CM

## 2020-04-04 ENCOUNTER — Other Ambulatory Visit: Payer: Self-pay

## 2020-04-04 ENCOUNTER — Encounter (INDEPENDENT_AMBULATORY_CARE_PROVIDER_SITE_OTHER): Payer: Medicare HMO | Admitting: Ophthalmology

## 2020-04-04 DIAGNOSIS — E113513 Type 2 diabetes mellitus with proliferative diabetic retinopathy with macular edema, bilateral: Secondary | ICD-10-CM | POA: Diagnosis not present

## 2020-04-04 DIAGNOSIS — E11311 Type 2 diabetes mellitus with unspecified diabetic retinopathy with macular edema: Secondary | ICD-10-CM | POA: Diagnosis not present

## 2020-04-04 DIAGNOSIS — I10 Essential (primary) hypertension: Secondary | ICD-10-CM

## 2020-04-04 DIAGNOSIS — H35371 Puckering of macula, right eye: Secondary | ICD-10-CM

## 2020-04-04 DIAGNOSIS — H43813 Vitreous degeneration, bilateral: Secondary | ICD-10-CM

## 2020-04-04 DIAGNOSIS — H35033 Hypertensive retinopathy, bilateral: Secondary | ICD-10-CM | POA: Diagnosis not present

## 2020-05-04 ENCOUNTER — Encounter (INDEPENDENT_AMBULATORY_CARE_PROVIDER_SITE_OTHER): Payer: Medicare HMO | Admitting: Ophthalmology

## 2020-05-09 ENCOUNTER — Other Ambulatory Visit: Payer: Self-pay

## 2020-05-09 ENCOUNTER — Encounter (INDEPENDENT_AMBULATORY_CARE_PROVIDER_SITE_OTHER): Payer: Medicare HMO | Admitting: Ophthalmology

## 2020-05-09 DIAGNOSIS — E113513 Type 2 diabetes mellitus with proliferative diabetic retinopathy with macular edema, bilateral: Secondary | ICD-10-CM

## 2020-05-09 DIAGNOSIS — H35371 Puckering of macula, right eye: Secondary | ICD-10-CM

## 2020-05-09 DIAGNOSIS — E11311 Type 2 diabetes mellitus with unspecified diabetic retinopathy with macular edema: Secondary | ICD-10-CM

## 2020-05-09 DIAGNOSIS — I10 Essential (primary) hypertension: Secondary | ICD-10-CM

## 2020-05-09 DIAGNOSIS — H35033 Hypertensive retinopathy, bilateral: Secondary | ICD-10-CM

## 2020-05-09 DIAGNOSIS — H43813 Vitreous degeneration, bilateral: Secondary | ICD-10-CM

## 2020-06-06 ENCOUNTER — Encounter (INDEPENDENT_AMBULATORY_CARE_PROVIDER_SITE_OTHER): Payer: Medicare HMO | Admitting: Ophthalmology

## 2020-06-06 ENCOUNTER — Other Ambulatory Visit: Payer: Self-pay

## 2020-06-06 DIAGNOSIS — E113513 Type 2 diabetes mellitus with proliferative diabetic retinopathy with macular edema, bilateral: Secondary | ICD-10-CM

## 2020-06-06 DIAGNOSIS — I10 Essential (primary) hypertension: Secondary | ICD-10-CM | POA: Diagnosis not present

## 2020-06-06 DIAGNOSIS — H43813 Vitreous degeneration, bilateral: Secondary | ICD-10-CM | POA: Diagnosis not present

## 2020-06-06 DIAGNOSIS — H35033 Hypertensive retinopathy, bilateral: Secondary | ICD-10-CM

## 2020-07-03 ENCOUNTER — Encounter (INDEPENDENT_AMBULATORY_CARE_PROVIDER_SITE_OTHER): Payer: Medicare HMO | Admitting: Ophthalmology

## 2020-07-03 ENCOUNTER — Other Ambulatory Visit: Payer: Self-pay

## 2020-07-03 DIAGNOSIS — E113513 Type 2 diabetes mellitus with proliferative diabetic retinopathy with macular edema, bilateral: Secondary | ICD-10-CM | POA: Diagnosis not present

## 2020-07-03 DIAGNOSIS — I10 Essential (primary) hypertension: Secondary | ICD-10-CM

## 2020-07-03 DIAGNOSIS — H43813 Vitreous degeneration, bilateral: Secondary | ICD-10-CM

## 2020-07-03 DIAGNOSIS — H35033 Hypertensive retinopathy, bilateral: Secondary | ICD-10-CM | POA: Diagnosis not present

## 2020-08-13 ENCOUNTER — Other Ambulatory Visit: Payer: Self-pay

## 2020-08-13 ENCOUNTER — Encounter (INDEPENDENT_AMBULATORY_CARE_PROVIDER_SITE_OTHER): Payer: Medicare HMO | Admitting: Ophthalmology

## 2020-08-13 DIAGNOSIS — E113513 Type 2 diabetes mellitus with proliferative diabetic retinopathy with macular edema, bilateral: Secondary | ICD-10-CM | POA: Diagnosis not present

## 2020-08-13 DIAGNOSIS — H35033 Hypertensive retinopathy, bilateral: Secondary | ICD-10-CM | POA: Diagnosis not present

## 2020-08-13 DIAGNOSIS — H43813 Vitreous degeneration, bilateral: Secondary | ICD-10-CM

## 2020-08-13 DIAGNOSIS — I10 Essential (primary) hypertension: Secondary | ICD-10-CM

## 2020-09-02 NOTE — Progress Notes (Addendum)
Subjective:    Robert Ramirez - 75 y.o. male MRN [id number]  Date of birth: [birth year]  HPI  Robert Ramirez is to establish care. Patient has a PMH significant for essential hypertension, other secondary pulmonary hypertension, chronic respiratory failure with hypoxia and hypercapnia, pleural plaque, chronic obstructive asthma with obstructive pulmonary disease, diabetes, and hyponatremia.   Current issues and/or concerns: None. Reports had annual physical exam with labs about 3 months ago.  ROS per HPI   Health Maintenance:  Health Maintenance Due  Topic Date Due  . Hepatitis C Screening  Never done  . COVID-19 Vaccine (1) Never done  . FOOT EXAM  Never done  . OPHTHALMOLOGY EXAM  Never done  . TETANUS/TDAP  Never done  . COLONOSCOPY (Pts 45-21yrs Insurance coverage will need to be confirmed)  Never done  . PNA vac Low Risk Adult (2 of 2 - PPSV23) 01/19/2016  . HEMOGLOBIN A1C  09/12/2018     Past Medical History: Patient Active Problem List   Diagnosis Date Noted  . Poorly-controlled hypertension 04/05/2019  . Conductive hearing loss 04/05/2019  . Chewing tobacco use 04/05/2019  . Bilateral impacted cerumen 04/05/2019  . Primary hyperparathyroidism (HCC) 11/21/2018  . Chronic respiratory failure with hypoxia and hypercapnia (HCC) 11/18/2018  . Chronic obstructive asthma (with obstructive pulmonary disease) (HCC) 06/09/2018  . Other secondary pulmonary hypertension (HCC) 06/09/2018  . Healthcare maintenance 03/30/2018  . Serum total bilirubin elevated 03/26/2018  . Asbestos exposure 03/26/2018  . Lesion of skin of scalp 03/26/2018  . Coronary artery disease involving native coronary artery of native heart without angina pectoris 03/26/2018  . Acquired hypothyroidism 03/26/2018  . Pleural plaque 03/14/2018  . Diabetes (HCC) 03/14/2018  . Somnolence   . Hyponatremia   . Abnormal chest x-ray   . Hypertension associated with diabetes (HCC) 07/18/2014  . Mixed  hyperlipidemia 07/18/2014  . Diabetic retinopathy associated with type 2 diabetes mellitus (HCC) 07/18/2014    Social History   reports that he quit smoking about 40 years ago. He has a 18.50 pack-year smoking history. His smokeless tobacco use includes chew and snuff. He reports that he does not drink alcohol and does not use drugs.   Family History  family history includes Cancer in his father and sister; Diabetes in his sister; Heart failure in his mother.   Medications: reviewed and updated   Objective:   Physical Exam BP 135/75 (BP Location: Left Arm, Patient Position: Sitting)   Pulse 78   Ht 6' 0.24" (1.835 m)   Wt 211 lb 12.8 oz (96.1 kg)   SpO2 96%   BMI 28.53 kg/m  Physical Exam HENT:     Head: Normocephalic and atraumatic.  Eyes:     Extraocular Movements: Extraocular movements intact.     Conjunctiva/sclera: Conjunctivae normal.     Pupils: Pupils are equal, round, and reactive to light.  Cardiovascular:     Rate and Rhythm: Normal rate and regular rhythm.     Pulses: Normal pulses.     Heart sounds: Normal heart sounds.  Pulmonary:     Effort: Pulmonary effort is normal.     Breath sounds: Normal breath sounds.  Musculoskeletal:     Cervical back: Normal range of motion and neck supple.  Neurological:     General: No focal deficit present.     Mental Status: He is alert and oriented to person, place, and time.  Psychiatric:        Mood and Affect: Mood normal.  Behavior: Behavior normal.      Assessment & Plan:  1. Encounter to establish care: - Patient presents today to establish care.  - Return for annual physical examination, labs, and health maintenance.   Patient was given clear instructions to go to Emergency Department or return to medical center if symptoms don't improve, worsen, or new problems develop.The patient verbalized understanding.  I discussed the assessment and treatment plan with the patient. The patient was provided an  opportunity to ask questions and all were answered. The patient agreed with the plan and demonstrated an understanding of the instructions.   The patient was advised to call back or seek an in-person evaluation if the symptoms worsen or if the condition fails to improve as anticipated.    Ricky Stabs, NP 09/03/2020, 2:20 PM Primary Care at [facility]

## 2020-09-03 ENCOUNTER — Ambulatory Visit (INDEPENDENT_AMBULATORY_CARE_PROVIDER_SITE_OTHER): Payer: Medicare HMO | Admitting: Family

## 2020-09-03 ENCOUNTER — Encounter: Payer: Self-pay | Admitting: Family

## 2020-09-03 ENCOUNTER — Other Ambulatory Visit: Payer: Self-pay

## 2020-09-03 VITALS — BP 135/75 | HR 78 | Ht 72.24 in | Wt 211.8 lb

## 2020-09-03 DIAGNOSIS — Z7689 Persons encountering health services in other specified circumstances: Secondary | ICD-10-CM

## 2020-09-03 NOTE — Patient Instructions (Addendum)
Thank you for choosing Primary Care at [facility] for your medical home!    Robert Ramirez was seen by Rema Fendt, NP today.   Cortez Linford's primary care provider is Rema Fendt, NP.   For the best care possible,  you should try to see Rema Fendt, NP whenever you come to clinic.   We look forward to seeing you again soon!  If you have any questions about your visit today,  please call us at [phone]  Or feel free to reach your provider via MyChart.     Hypertension, Adult Hypertension is another name for high blood pressure. High blood pressure forces your heart to work harder to pump blood. This can cause problems over time. There are two numbers in a blood pressure reading. There is a top number (systolic) over a bottom number (diastolic). It is best to have a blood pressure that is below 120/80. Healthy choices can help lower your blood pressure, or you may need medicine to help lower it. What are the causes? The cause of this condition is not known. Some conditions may be related to high blood pressure. What increases the risk?  Smoking.  Having type 2 diabetes mellitus, high cholesterol, or both.  Not getting enough exercise or physical activity.  Being overweight.  Having too much fat, sugar, calories, or salt (sodium) in your diet.  Drinking too much alcohol.  Having long-term (chronic) kidney disease.  Having a family history of high blood pressure.  Age. Risk increases with age.  Race. You may be at higher risk if you are African American.  Gender. Men are at higher risk than women before age 77. After age 24, women are at higher risk than men.  Having obstructive sleep apnea.  Stress. What are the signs or symptoms?  High blood pressure may not cause symptoms. Very high blood pressure (hypertensive crisis) may cause: ? Headache. ? Feelings of worry or nervousness (anxiety). ? Shortness of breath. ? Nosebleed. ? A feeling of  being sick to your stomach (nausea). ? Throwing up (vomiting). ? Changes in how you see. ? Very bad chest pain. ? Seizures. How is this treated?  This condition is treated by making healthy lifestyle changes, such as: ? Eating healthy foods. ? Exercising more. ? Drinking less alcohol.  Your health care provider may prescribe medicine if lifestyle changes are not enough to get your blood pressure under control, and if: ? Your top number is above 130. ? Your bottom number is above 80.  Your personal target blood pressure may vary. Follow these instructions at home: Eating and drinking  If told, follow the DASH eating plan. To follow this plan: ? Fill one half of your plate at each meal with fruits and vegetables. ? Fill one fourth of your plate at each meal with whole grains. Whole grains include whole-wheat pasta, brown rice, and whole-grain bread. ? Eat or drink low-fat dairy products, such as skim milk or low-fat yogurt. ? Fill one fourth of your plate at each meal with low-fat (lean) proteins. Low-fat proteins include fish, chicken without skin, eggs, beans, and tofu. ? Avoid fatty meat, cured and processed meat, or chicken with skin. ? Avoid pre-made or processed food.  Eat less than 1,500 mg of salt each day.  Do not drink alcohol if: ? Your doctor tells you not to drink. ? You are pregnant, may be pregnant, or are planning to become pregnant.  If you drink alcohol: ?  Limit how much you use to:  0-1 drink a day for women.  0-2 drinks a day for men. ? Be aware of how much alcohol is in your drink. In the [place] one drink equals one 12 oz bottle of beer (355 mL), one 5 oz glass of wine (148 mL), or one 1 oz glass of hard liquor (44 mL).   Lifestyle  Work with your doctor to stay at a healthy weight or to lose weight. Ask your doctor what the best weight is for you.  Get at least 30 minutes of exercise most days of the week. This may include walking, swimming, or  biking.  Get at least 30 minutes of exercise that strengthens your muscles (resistance exercise) at least 3 days a week. This may include lifting weights or doing Pilates.  Do not use any products that contain nicotine or tobacco, such as cigarettes, e-cigarettes, and chewing tobacco. If you need help quitting, ask your doctor.  Check your blood pressure at home as told by your doctor.  Keep all follow-up visits as told by your doctor. This is important.   Medicines  Take over-the-counter and prescription medicines only as told by your doctor. Follow directions carefully.  Do not skip doses of blood pressure medicine. The medicine does not work as well if you skip doses. Skipping doses also puts you at risk for problems.  Ask your doctor about side effects or reactions to medicines that you should watch for. Contact a doctor if you:  Think you are having a reaction to the medicine you are taking.  Have headaches that keep coming back (recurring).  Feel dizzy.  Have swelling in your ankles.  Have trouble with your vision. Get help right away if you:  Get a very bad headache.  Start to feel mixed up (confused).  Feel weak or numb.  Feel faint.  Have very bad pain in your: ? Chest. ? Belly (abdomen).  Throw up more than once.  Have trouble breathing. Summary  Hypertension is another name for high blood pressure.  High blood pressure forces your heart to work harder to pump blood.  For most people, a normal blood pressure is less than 120/80.  Making healthy choices can help lower blood pressure. If your blood pressure does not get lower with healthy choices, you may need to take medicine. This information is not intended to replace advice given to you by your health care provider. Make sure you discuss any questions you have with your health care provider. Document Revised: 01/20/2018 Document Reviewed: 01/20/2018 Elsevier Patient Education  2021 [employer].

## 2020-09-03 NOTE — Progress Notes (Signed)
Establish care

## 2020-09-10 ENCOUNTER — Telehealth: Payer: Self-pay | Admitting: Family

## 2020-09-10 NOTE — Telephone Encounter (Signed)
Pt states he would like a fluid pill.Pt verbalized he did not know what kind of medication it is called.   I read all of his meds to him and he explained it's a pill that he takes when his legs swells up. He informed me he spoke with his provider regarding the med. Which I looked in the AVS/ chart and did not see any notes left about the med. Please follow up with pt and let him know his next steps. His cell is [phone].

## 2020-09-11 NOTE — Telephone Encounter (Signed)
Please schedule appointment.   Report to Emergency Department or Urgent Care if symptoms worsen or new problems develop.

## 2020-09-12 ENCOUNTER — Telehealth: Payer: Self-pay | Admitting: Pulmonary Disease

## 2020-09-12 NOTE — Telephone Encounter (Signed)
Spoke to patient, who is requesting refill on lasix.  Patient last seen 08/2018 with no pending appt.  Lasix was not originally prescribed by our office.  Advised patient to contact PCP for refills.  Patient does not wish to schedule an appt with our office at this time, as he does not think there is a need.  Nothing further is needed at this time.

## 2020-10-08 ENCOUNTER — Encounter (INDEPENDENT_AMBULATORY_CARE_PROVIDER_SITE_OTHER): Payer: Medicare HMO | Admitting: Ophthalmology

## 2020-10-08 ENCOUNTER — Other Ambulatory Visit: Payer: Self-pay

## 2020-10-08 DIAGNOSIS — E113513 Type 2 diabetes mellitus with proliferative diabetic retinopathy with macular edema, bilateral: Secondary | ICD-10-CM

## 2020-10-08 DIAGNOSIS — H35033 Hypertensive retinopathy, bilateral: Secondary | ICD-10-CM | POA: Diagnosis not present

## 2020-10-08 DIAGNOSIS — I10 Essential (primary) hypertension: Secondary | ICD-10-CM | POA: Diagnosis not present

## 2020-10-08 DIAGNOSIS — H43813 Vitreous degeneration, bilateral: Secondary | ICD-10-CM

## 2020-10-08 DIAGNOSIS — H35371 Puckering of macula, right eye: Secondary | ICD-10-CM

## 2020-11-19 ENCOUNTER — Encounter (INDEPENDENT_AMBULATORY_CARE_PROVIDER_SITE_OTHER): Payer: Medicare HMO | Admitting: Ophthalmology

## 2020-11-19 DIAGNOSIS — H35033 Hypertensive retinopathy, bilateral: Secondary | ICD-10-CM | POA: Diagnosis not present

## 2020-11-19 DIAGNOSIS — I10 Essential (primary) hypertension: Secondary | ICD-10-CM | POA: Diagnosis not present

## 2020-11-19 DIAGNOSIS — E113513 Type 2 diabetes mellitus with proliferative diabetic retinopathy with macular edema, bilateral: Secondary | ICD-10-CM | POA: Diagnosis not present

## 2020-11-19 DIAGNOSIS — H43813 Vitreous degeneration, bilateral: Secondary | ICD-10-CM

## 2020-12-03 ENCOUNTER — Ambulatory Visit (INDEPENDENT_AMBULATORY_CARE_PROVIDER_SITE_OTHER): Payer: Medicare HMO | Admitting: Family

## 2020-12-03 ENCOUNTER — Encounter: Payer: Self-pay | Admitting: Family

## 2020-12-03 ENCOUNTER — Other Ambulatory Visit: Payer: Self-pay

## 2020-12-03 VITALS — BP 166/73 | HR 73 | Temp 97.9°F | Resp 18 | Ht 72.24 in | Wt 209.4 lb

## 2020-12-03 DIAGNOSIS — Z1159 Encounter for screening for other viral diseases: Secondary | ICD-10-CM

## 2020-12-03 DIAGNOSIS — Z0100 Encounter for examination of eyes and vision without abnormal findings: Secondary | ICD-10-CM

## 2020-12-03 DIAGNOSIS — Z13228 Encounter for screening for other metabolic disorders: Secondary | ICD-10-CM

## 2020-12-03 DIAGNOSIS — Z1329 Encounter for screening for other suspected endocrine disorder: Secondary | ICD-10-CM

## 2020-12-03 DIAGNOSIS — R600 Localized edema: Secondary | ICD-10-CM

## 2020-12-03 DIAGNOSIS — Z794 Long term (current) use of insulin: Secondary | ICD-10-CM

## 2020-12-03 DIAGNOSIS — E11319 Type 2 diabetes mellitus with unspecified diabetic retinopathy without macular edema: Secondary | ICD-10-CM

## 2020-12-03 DIAGNOSIS — I10 Essential (primary) hypertension: Secondary | ICD-10-CM

## 2020-12-03 DIAGNOSIS — Z0000 Encounter for general adult medical examination without abnormal findings: Secondary | ICD-10-CM

## 2020-12-03 DIAGNOSIS — Z0001 Encounter for general adult medical examination with abnormal findings: Secondary | ICD-10-CM

## 2020-12-03 DIAGNOSIS — Z1211 Encounter for screening for malignant neoplasm of colon: Secondary | ICD-10-CM

## 2020-12-03 DIAGNOSIS — Z23 Encounter for immunization: Secondary | ICD-10-CM

## 2020-12-03 DIAGNOSIS — Z130 Encounter for screening for diseases of the blood and blood-forming organs and certain disorders involving the immune mechanism: Secondary | ICD-10-CM

## 2020-12-03 DIAGNOSIS — E119 Type 2 diabetes mellitus without complications: Secondary | ICD-10-CM | POA: Diagnosis not present

## 2020-12-03 DIAGNOSIS — E782 Mixed hyperlipidemia: Secondary | ICD-10-CM

## 2020-12-03 LAB — POCT GLYCOSYLATED HEMOGLOBIN (HGB A1C): Hemoglobin A1C: 7.6 % — AB (ref 4.0–5.6)

## 2020-12-03 MED ORDER — AMLODIPINE BESYLATE 10 MG PO TABS: 10.0000 mg | ORAL_TABLET | Freq: Every day | ORAL | 0 refills | Status: DC

## 2020-12-03 MED ORDER — INSULIN NPH (HUMAN) (ISOPHANE) 100 UNIT/ML ~~LOC~~ SUSP: 30.0000 [IU] | Freq: Two times a day (BID) | SUBCUTANEOUS | 0 refills | Status: DC

## 2020-12-03 MED ORDER — PEN NEEDLES 31G X 8 MM MISC: 6 refills | Status: DC

## 2020-12-03 MED ORDER — SULFAMETHOXAZOLE-TRIMETHOPRIM 800-160 MG PO TABS: 1.0000 | ORAL_TABLET | Freq: Two times a day (BID) | ORAL | 0 refills | Status: AC

## 2020-12-03 MED ORDER — METFORMIN HCL 1000 MG PO TABS: 1000.0000 mg | ORAL_TABLET | Freq: Two times a day (BID) | ORAL | 0 refills | Status: DC

## 2020-12-03 MED ORDER — FUROSEMIDE 20 MG PO TABS: 20.0000 mg | ORAL_TABLET | Freq: Every day | ORAL | 0 refills | Status: DC | PRN

## 2020-12-03 MED ORDER — LISINOPRIL 20 MG PO TABS: 20.0000 mg | ORAL_TABLET | Freq: Every day | ORAL | 0 refills | Status: DC

## 2020-12-03 NOTE — Progress Notes (Signed)
Pt presents for medicare annual exam Pt needs refill on Furosemide 20mg  Pt has Diabetic eye exam on 8/8

## 2020-12-03 NOTE — Progress Notes (Signed)
Subjective:   Robert Ramirez is a 75 y.o. male who presents for Medicare Annual/Subsequent preventive examination.  Review of Systems: Reports hospital discharge 2 years ago related to respiratory failure and heart cath. Was seeing Cardiology at that time and Furosemide was prescribed as needed. Has not been seen by them since then. Reports was not medically released. Has been overall stable but still needs Furosemide from time to time. He ran out of the same and recently asked a family friend to loan him some of his Furosemide pills to decrease leg swelling and it helped. Reports is usually takes about 5 days to get fluid off of his legs. Last time he had Furosemide was about 4 weeks ago.   He goes to get pedicures at the local nail salons with his wife. Reports he does not like to go to Podiatry. Today reports "I was kicking around in the chicken coop." when asked about the cut on his left foot third toe. Reports today is the first time he is seeing the cut because the provider just pointed it out. Reports sensation of left foot < right foot. He is unable to bend down and touch his legs and feet reporting this is the reason he does not wear compression stockings.   He is consistently taking Novolin 30 units daily and Metformin for diabetes.   Taking Amlodipine and Lisinopril consistently for high blood pressure. Home blood pressures 150's-170's/60's-70's  Cardiac Risk Factors include: advanced age (>15mn, >>34women);diabetes mellitus   Objective:    Today's Vitals   12/03/20 1413  BP: (!) 166/73  Pulse: 73  Resp: 18  Temp: 97.9 F (36.6 C)  SpO2: 97%  Weight: 209 lb 6.4 oz (95 kg)  Height: 6' 0.24" (1.835 m)  PainSc: 0-No pain   Body mass index is 28.21 kg/m.  Physical Exam HENT:     Head: Normocephalic and atraumatic.     Right Ear: Tympanic membrane, ear canal and external ear normal.     Left Ear: Tympanic membrane, ear canal and external ear normal.  Eyes:      Extraocular Movements: Extraocular movements intact.     Conjunctiva/sclera: Conjunctivae normal.     Pupils: Pupils are equal, round, and reactive to light.  Cardiovascular:     Rate and Rhythm: Normal rate and regular rhythm.     Pulses: Normal pulses.     Heart sounds: Normal heart sounds.  Pulmonary:     Effort: Pulmonary effort is normal.     Breath sounds: Normal breath sounds.  Abdominal:     General: Bowel sounds are normal.  Genitourinary:    Comments: Patient declined exam.  Musculoskeletal:     Cervical back: Normal range of motion and neck supple.     Right lower leg: 2+ Edema present.     Left lower leg: 2+ Edema present.     Comments: Left foot third toe with break in skin a little deeper than paper cut, evidence of yellow-red drainage. Bilateral feet with long thickened yellowed toenails with evidence of thick mud underneath several toenails patient reports "I have been kicking around in the chicken coop at home." Bilateral lower extremities without evidence of tenderness or pain, does have mild erythema and flaking skin.   Skin:    General: Skin is warm and dry.     Capillary Refill: Capillary refill takes less than 2 seconds.  Neurological:     General: No focal deficit present.     Mental Status:  He is alert and oriented to person, place, and time.  Psychiatric:        Mood and Affect: Mood normal.        Behavior: Behavior normal.   Diabetic foot exam was performed with the following findings:   No deformities. Decreased sensation bilateral feet.      Advanced Directives 12/03/2020 05/12/2018 03/13/2018 03/13/2018  Does Patient Have a Medical Advance Directive? No No No No  Would patient like information on creating a medical advance directive? Yes (Inpatient - patient defers creating a medical advance directive at this time - Information given) No - Patient declined No - Patient declined No - Patient declined    Current Medications (verified) Outpatient  Encounter Medications as of 12/03/2020  Medication Sig   amLODipine (NORVASC) 10 MG tablet Take 10 mg by mouth daily.   aspirin EC 81 MG EC tablet Take 1 tablet (81 mg total) by mouth daily.   atorvastatin (LIPITOR) 40 MG tablet Take 1 tablet (40 mg total) by mouth daily at 6 PM.   Cholecalciferol (VITAMIN D-3) 25 MCG (1000 UT) CAPS Take by mouth.   Cyanocobalamin 2500 MCG CHEW Chew by mouth daily.   furosemide (LASIX) 20 MG tablet Take 20 mg by mouth daily.   insulin NPH Human (HUMULIN N,NOVOLIN N) 100 UNIT/ML injection Inject 0.15 mLs (15 Units total) into the skin 2 (two) times daily before a meal.   levothyroxine (SYNTHROID, LEVOTHROID) 25 MCG tablet Take 25 mcg by mouth daily.   lisinopril (PRINIVIL,ZESTRIL) 10 MG tablet Take 1 tablet (10 mg total) by mouth daily.   metFORMIN (GLUCOPHAGE) 1000 MG tablet Take 1,000 mg by mouth 2 (two) times daily with a meal.   No facility-administered encounter medications on file as of 12/03/2020.    Allergies (verified) Patient has no known allergies.   History: Past Medical History:  Diagnosis Date   Diabetes mellitus    High cholesterol    Hypertension    Past Surgical History:  Procedure Laterality Date   APPENDECTOMY     RIGHT/LEFT HEART CATH AND CORONARY ANGIOGRAPHY N/A 03/17/2018   Procedure: RIGHT/LEFT HEART CATH AND CORONARY ANGIOGRAPHY;  Surgeon: Dixie Dials, MD;  Location: [facility];  Service: Cardiovascular;  Laterality: N/A;   Family History  Problem Relation Age of Onset   Heart failure Mother    Cancer Father    Diabetes Sister    Cancer Sister    Social History   Socioeconomic History   Marital status: Married    Spouse name: Not on file   Number of children: Not on file   Years of education: Not on file   Highest education level: Not on file  Occupational History   Not on file  Tobacco Use   Smoking status: Former    Packs/day: 0.50    Years: 37.00    Pack years: 18.50    Types: Cigarettes    Quit  date: 03/30/1980    Years since quitting: 40.7   Smokeless tobacco: Current    Types: Chew, Snuff  Vaping Use   Vaping Use: Never used  Substance and Sexual Activity   Alcohol use: No   Drug use: No   Sexual activity: Not on file  Other Topics Concern   Not on file  Social History Narrative   Not on file   Social Determinants of Health   Financial Resource Strain: Not on file  Food Insecurity: Not on file  Transportation Needs: Not on file  Physical Activity: Not on file  Stress: Not on file  Social Connections: Not on file    Tobacco Counseling Reports smoking when he was about 75 years-old or 75 years-old. Then quit when he was 75 years-old. At that time smoking 1 to 2 packs daily. Declines lung cancer screening. He uses Bipap at bedtime.   Clinical Intake:  Pre-visit preparation completed: Yes  Pain : 0-10 Pain Score: 0-No pain   Diabetes: Yes CBG done?: No Did pt. bring in CBG monitor from home?: No   Diabetic? Yes  Interpreter Needed?: No  Activities of Daily Living In your present state of health, do you have any difficulty performing the following activities: 12/03/2020 09/03/2020  Hearing? N N  Vision? N N  Difficulty concentrating or making decisions? N N  Walking or climbing stairs? N N  Dressing or bathing? N N  Doing errands, shopping? N N  Preparing Food and eating ? N -  Using the Toilet? N -  In the past six months, have you accidently leaked urine? N -  Do you have problems with loss of bowel control? N -  Managing your Medications? N -  Managing your Finances? N -  Housekeeping or managing your Housekeeping? N -  Some recent data might be hidden    Patient Care Team: Camillia Herter, NP as PCP - General (Nurse Practitioner) Hayden Pedro, MD as consulting Ophthalmologist (reports seeing every 6 months)  Indicate any recent Medical Services you may have received from other than [facility] providers in the past year (date may be  approximate). None   Assessment:   This is a routine wellness examination for Robert Ramirez.  Hearing/Vision screen Vision Screening - Comments:: [facility] -Dr Zigmund Daniel  Dietary issues and exercise activities discussed: Current Exercise Habits: The patient does not participate in regular exercise at present  Goals Addressed: "I would like to get both cataracts removed."  PHQ 2/9 Scores 12/03/2020 09/03/2020  PHQ - 2 Score 0 0  PHQ- 9 Score - 0    Fall Risk Fall Risk  12/03/2020 09/03/2020  Falls in the past year? 0 0  Number falls in past yr: 0 0  Injury with Fall? 0 0  Risk for fall due to : No Fall Risks No Fall Risks  Follow up Falls evaluation completed Falls evaluation completed    [place]: Any stairs in or around the home? No  If so, are there any without handrails? Not applicable Home free of loose throw rugs in walkways, pet beds, electrical cords, etc? Yes  Adequate lighting in your home to reduce risk of falls? Yes   ASSISTIVE DEVICES UTILIZED TO PREVENT FALLS: Life alert? No  Use of a cane, walker or w/c? Reports using a walking stick when walking out in the yard at home.  Grab bars in the bathroom? No  Shower chair or bench in shower? No  Elevated toilet seat or a handicapped toilet? No   TIMED UP AND GO: Was the test performed? Yes .  Length of time to ambulate 10 feet: 15 sec.   Gait steady and fast without use of assistive device  Cognitive Function: MMSE - Mini Mental State Exam 12/03/2020  Orientation to time 5  Orientation to Place 5  Registration 3  Attention/ Calculation 5  Recall 3  Language- name 2 objects 2  Language- repeat 1  Language- follow 3 step command 3  Language- read &  follow direction 1  Write a sentence 1  Copy design 1  Total score 30    Immunizations Immunization History  Administered Date(s) Administered   Influenza, High Dose Seasonal PF 04/20/2013, 03/14/2018,  03/01/2019   Influenza-Unspecified 04/20/2013   Pneumococcal Conjugate-13 03/21/2009, 01/19/2015    TDAP status: Due, Education has been provided regarding the importance of this vaccine. Advised may receive this vaccine at local pharmacy or Health Dept. Aware to provide a copy of the vaccination record if obtained from local pharmacy or Health Dept. Verbalized acceptance and understanding.  Pneumococcal vaccine status: Declined,  Education has been provided regarding the importance of this vaccine but patient still declined. Advised may receive this vaccine at local pharmacy or Health Dept. Aware to provide a copy of the vaccination record if obtained from local pharmacy or Health Dept. Verbalized acceptance and understanding.   Covid-19 vaccine status: Completed vaccines  Qualifies for Shingles Vaccine? Yes  Reports [facility].   Screening Tests Health Maintenance  Topic Date Due   COVID-19 Vaccine (1) Never done   FOOT EXAM  Never done   OPHTHALMOLOGY EXAM  Never done   Hepatitis C Screening  Never done   TETANUS/TDAP  Never done   COLONOSCOPY (Pts 45-67yr Insurance coverage will need to be confirmed)  Never done   Zoster Vaccines- Shingrix (1 of 2) Never done   PNA vac Low Risk Adult (2 of 2 - PPSV23) 01/19/2016   HEMOGLOBIN A1C  09/12/2018   INFLUENZA VACCINE  12/24/2020   HPV VACCINES  Aged Out    Health Maintenance  Health Maintenance Due  Topic Date Due   COVID-19 Vaccine (1) Never done   FOOT EXAM  Never done   OPHTHALMOLOGY EXAM  Never done   Hepatitis C Screening  Never done   TETANUS/TDAP  Never done   COLONOSCOPY (Pts 45-465yrInsurance coverage will need to be confirmed)  Never done   Zoster Vaccines- Shingrix (1 of 2) Never done   PNA vac Low Risk Adult (2 of 2 - PPSV23) 01/19/2016   HEMOGLOBIN A1C  09/12/2018    Colorectal cancer screening: patient declined  Lung Cancer Screening: (Low Dose CT Chest recommended if Age 74-80ears, 30  pack-year currently smoking OR have quit w/in 15years.) does qualify.   Lung Cancer Screening Referral: patient declined  Additional Screening:  Hepatitis C Screening: does qualify; Completed 12/03/2020  Vision Screening: Recommended annual ophthalmology exams for early detection of glaucoma and other disorders of the eye. Is the patient up to date with their annual eye exam?  Yes  Who is the provider or what is the name of the office in which the patient attends annual eye exams? [facility]. Patient reports appointments every 6 months.  If pt is not established with a provider, would they like to be referred to a provider to establish care?  Not applicable .   Dental Screening: Recommended annual dental exams for proper oral hygiene.  Community Resource Referral / Chronic Care Management: CRR required this visit?  No   CCM required this visit?  No   Plan:  1. Medicare annual wellness visit, subsequent: - Counseled on 150 minutes of exercise per week as tolerated, healthy eating (including decreased daily intake of saturated fats, cholesterol, added sugars, sodium), STI prevention, and routine healthcare maintenance.  2. Screening for metabolic disorder: - [id number] check kidney function, liver function, and electrolyte balance.  - Comprehensive metabolic panel  3. Screening for deficiency anemia: -  CBC to screen for anemia. - CBC  4. Thyroid disorder screen: - TSH to check thyroid function.  - TSH  5. Need for hepatitis C screening test: - Hepatitis C antibody to screen for hepatitis C.  - Hepatitis C Antibody  6. Colon cancer screening: - Patient declined.   7. Essential hypertension: - Blood pressure not at goal during today's visit. Patient asymptomatic without chest pressure, chest pain, palpitations, shortness of breath, and worst headache of life. - Home blood pressures not at goal.  - Continue Amlodipine as prescribed.  - Increase Lisinopril from 10 mg  daily to 20 mg daily.  - Counseled on blood pressure goal of less than 140/90, low-sodium, DASH diet, medication compliance, 150 minutes of moderate intensity exercise per week as tolerated. Discussed medication compliance, adverse effects. - Follow-up with primary provider in 2 weeks or sooner if needed.  - lisinopril (ZESTRIL) 20 MG tablet; Take 1 tablet (20 mg total) by mouth daily.  Dispense: 90 tablet; Refill: 0 - amLODipine (NORVASC) 10 MG tablet; Take 1 tablet (10 mg total) by mouth daily.  Dispense: 90 tablet; Refill: 0  8. Type 2 diabetes mellitus with retinopathy, with long-term current use of insulin, macular edema presence unspecified, unspecified laterality, unspecified retinopathy severity ([place]): - Hemoglobin A1c at goal today at 7.6%, goal < 8%. This is improved from previous hemoglobin A1c of 10.2% on 03/13/2018. Next hemoglobin A1c due October 2022.  - Continue Metformin and Insulin NPH Human as prescribed.  - Begin Sulfamethoxazole-Trimethoprim to cover for possible development of diabetic foot infection.  - Discussed the importance of healthy eating habits, low-carbohydrate diet, low-sugar diet, regular aerobic exercise (at least 150 minutes a week as tolerated) and medication compliance to achieve or maintain control of diabetes. - Follow-up with primary provider in 3 months or sooner if needed.  - POCT glycosylated hemoglobin (Hb A1C) - metFORMIN (GLUCOPHAGE) 1000 MG tablet; Take 1 tablet (1,000 mg total) by mouth 2 (two) times daily with a meal.  Dispense: 180 tablet; Refill: 0 - sulfamethoxazole-trimethoprim (BACTRIM DS) 800-160 MG tablet; Take 1 tablet by mouth 2 (two) times daily for 7 days.  Dispense: 14 tablet; Refill: 0 - insulin NPH Human (NOVOLIN N) 100 UNIT/ML injection; Inject 0.3 mLs (30 Units total) into the skin 2 (two) times daily before a meal.  Dispense: 54 mL; Refill: 0 - Insulin Pen Needle (PEN NEEDLES) 31G X 8 MM MISC; UAD  Dispense: 100 each; Refill: 6  9.  Diabetic eye exam [facility]): - Keep all appointment with Tempie Hoist, MD.  10. Mixed hyperlipidemia: - Lipid panel to screen for level of cholesterol control.  - Lipid panel  11. Bilateral lower extremity edema: - Furosemide as prescribed.  - Follow-up with primary provider as scheduled.  - furosemide (LASIX) 20 MG tablet; Take 1 tablet (20 mg total) by mouth daily as needed for up to 14 days.  Dispense: 14 tablet; Refill: 0  12. Need for diphtheria-tetanus-pertussis (Tdap) vaccine: - Patient declined.   13. Need for vaccination against Streptococcus pneumoniae: - Patient declined.    I have personally reviewed and noted the following in the patient's chart:   Medical and social history Use of alcohol, tobacco or illicit drugs  Current medications and supplements including opioid prescriptions. Patient is not currently taking opioid prescriptions. Functional ability and status Nutritional status Physical activity Advanced directives List of other physicians Hospitalizations, surgeries, and ER visits in previous 12 months Vitals Screenings to include cognitive, depression, and falls Referrals and  appointments  In addition, I have reviewed and discussed with patient certain preventive protocols, quality metrics, and best practice recommendations. A written personalized care plan for preventive services as well as general preventive health recommendations were provided to patient.     Camillia Herter, NP   12/03/2020

## 2020-12-04 ENCOUNTER — Other Ambulatory Visit: Payer: Self-pay | Admitting: Family

## 2020-12-04 DIAGNOSIS — E039 Hypothyroidism, unspecified: Secondary | ICD-10-CM

## 2020-12-04 DIAGNOSIS — E785 Hyperlipidemia, unspecified: Secondary | ICD-10-CM

## 2020-12-04 LAB — COMPREHENSIVE METABOLIC PANEL
ALT: 13 IU/L (ref 0–44)
AST: 19 IU/L (ref 0–40)
Albumin/Globulin Ratio: 1.8 (ref 1.2–2.2)
Albumin: 4.3 g/dL (ref 3.7–4.7)
Alkaline Phosphatase: 124 IU/L — ABNORMAL HIGH (ref 44–121)
BUN/Creatinine Ratio: 15 (ref 10–24)
BUN: 16 mg/dL (ref 8–27)
Bilirubin Total: 0.7 mg/dL (ref 0.0–1.2)
CO2: 24 mmol/L (ref 20–29)
Calcium: 10 mg/dL (ref 8.6–10.2)
Chloride: 97 mmol/L (ref 96–106)
Creatinine, Ser: 1.08 mg/dL (ref 0.76–1.27)
Globulin, Total: 2.4 g/dL (ref 1.5–4.5)
Glucose: 140 mg/dL — ABNORMAL HIGH (ref 65–99)
Potassium: 4.3 mmol/L (ref 3.5–5.2)
Sodium: 139 mmol/L (ref 134–144)
Total Protein: 6.7 g/dL (ref 6.0–8.5)
eGFR: 72 mL/min/{1.73_m2} (ref >59)

## 2020-12-04 LAB — CBC
Hematocrit: 44 % (ref 37.5–51.0)
Hemoglobin: 14.5 g/dL (ref 13.0–17.7)
MCH: 28.5 pg (ref 26.6–33.0)
MCHC: 33 g/dL (ref 31.5–35.7)
MCV: 87 fL (ref 79–97)
Platelets: 216 10*3/uL (ref 150–450)
RBC: 5.08 x10E6/uL (ref 4.14–5.80)
RDW: 12.6 % (ref 11.6–15.4)
WBC: 9.6 10*3/uL (ref 3.4–10.8)

## 2020-12-04 LAB — LIPID PANEL
Chol/HDL Ratio: 2.7 ratio (ref 0.0–5.0)
Cholesterol, Total: 114 mg/dL (ref 100–199)
HDL: 43 mg/dL (ref >39)
LDL Chol Calc (NIH): 54 mg/dL (ref 0–99)
Triglycerides: 87 mg/dL (ref 0–149)
VLDL Cholesterol Cal: 17 mg/dL (ref 5–40)

## 2020-12-04 LAB — HEPATITIS C ANTIBODY: Hep C Virus Ab: 0.1 s/co ratio (ref 0.0–0.9)

## 2020-12-04 LAB — TSH: TSH: 4.71 u[IU]/mL — ABNORMAL HIGH (ref 0.450–4.500)

## 2020-12-04 MED ORDER — LEVOTHYROXINE SODIUM 50 MCG PO TABS: 50.0000 ug | ORAL_TABLET | Freq: Every day | ORAL | 0 refills | Status: DC

## 2020-12-04 MED ORDER — ATORVASTATIN CALCIUM 40 MG PO TABS: 40.0000 mg | ORAL_TABLET | Freq: Every day | ORAL | 0 refills | Status: DC

## 2020-12-04 NOTE — Progress Notes (Signed)
Please call patient with update.   Kidney function normal.   Liver function normal.   No anemia.   Hepatitis C negative.   Diabetes discussed in office.   Thyroid function higher than normal. I see a history of Levothyroxine on his medication list but no active orders. Please confirm with the patient if he is currently taking Levothyroxine at what dose and notify me. Once I receive this information we will need to call patient back with the next steps about the dosage of this medication.   Cholesterol normal. I see a history of Atorvastatin on his medication list but no active orders. Please confirm with the patient if he is currently taking Atorvastatin at what dose and notify me. If he is currently taking he can continue as normal.

## 2020-12-04 NOTE — Progress Notes (Signed)
Please call patient with update.   Continue Atorvastatin at current dose.   Increase Levothyroxine from 25 mcg daily to 50 mcg daily. Encouraged to recheck thyroid blood work at lab only visit in 6 weeks.   Per patient request medications sent to [facility].

## 2020-12-24 ENCOUNTER — Ambulatory Visit: Payer: Medicare HMO | Admitting: Family

## 2020-12-28 ENCOUNTER — Ambulatory Visit (INDEPENDENT_AMBULATORY_CARE_PROVIDER_SITE_OTHER): Payer: Medicare HMO | Admitting: Family

## 2020-12-28 ENCOUNTER — Other Ambulatory Visit: Payer: Self-pay

## 2020-12-28 ENCOUNTER — Encounter: Payer: Self-pay | Admitting: Family

## 2020-12-28 VITALS — BP 182/69 | HR 65 | Temp 97.3°F | Resp 16 | Ht 73.5 in | Wt 210.0 lb

## 2020-12-28 DIAGNOSIS — E039 Hypothyroidism, unspecified: Secondary | ICD-10-CM

## 2020-12-28 DIAGNOSIS — R600 Localized edema: Secondary | ICD-10-CM

## 2020-12-28 DIAGNOSIS — I10 Essential (primary) hypertension: Secondary | ICD-10-CM

## 2020-12-28 MED ORDER — LISINOPRIL 20 MG PO TABS: ORAL_TABLET | ORAL | 2 refills | Status: DC

## 2020-12-28 MED ORDER — FUROSEMIDE 20 MG PO TABS: 20.0000 mg | ORAL_TABLET | Freq: Every day | ORAL | 0 refills | Status: DC | PRN

## 2020-12-28 NOTE — Progress Notes (Signed)
Robert Ramirez, is a 75 y.o. male  YFV:[id number]  [id number]  DOB - 14-Aug-1945  Subjective:  Chief Complaint and HPI: Robert Ramirez is a 75 y.o. male who presents to the clinic this morning for hypothyroidism follow-up, increased fluid retention to lower extremities.  Patient was last seen by his PCP, and his Synthroid dose was increased to 50 mcg p.o. daily.  He was put on Lasix 20 mg p.o. as needed daily and believes his fluid retention has decreased now he is out of the medication.  She denies any chest pains, numbness of breath, fever, headache or any other symptom done fluid retention.  Also denies constipation, dry skin, constipation.  ED/Hospital notes reviewed.    ROS:   Constitutional:  No f/c, No night sweats, No unexplained weight loss. EENT:  No vision changes, No blurry vision, No hearing changes. No mouth, throat, or ear problems.  Respiratory: No cough, No SOB Cardiac: No CP, no palpitations Musculoskeletal: No joint pain.  Reports fluid retention to lower extremities. Neuro: No headache, no dizziness, no motor weakness.  Skin: No rash Endocrine:  No polydipsia. No polyuria.  Psych: Denies SI/HI  No problems updated.  ALLERGIES: No Known Allergies  PAST MEDICAL HISTORY: Past Medical History:  Diagnosis Date   Diabetes mellitus    High cholesterol    Hypertension     MEDICATIONS AT HOME: Prior to Admission medications   Medication Sig Start Date End Date Taking? Authorizing Provider  amLODipine (NORVASC) 10 MG tablet Take 1 tablet (10 mg total) by mouth daily. 12/03/20 03/03/21 Yes Rema Fendt, NP  aspirin EC 81 MG EC tablet Take 1 tablet (81 mg total) by mouth daily. 03/21/18  Yes Shon Hale, MD  atorvastatin (LIPITOR) 40 MG tablet Take 1 tablet (40 mg total) by mouth daily at 6 PM. 12/04/20 03/04/21 Yes Zonia Kief, Amy J, NP  Cholecalciferol (VITAMIN D-3) 25 MCG (1000 UT) CAPS Take by mouth.   Yes [provider]  Cyanocobalamin  2500 MCG CHEW Chew by mouth daily.   Yes [provider]  insulin NPH Human (NOVOLIN N) 100 UNIT/ML injection Inject 0.3 mLs (30 Units total) into the skin 2 (two) times daily before a meal. 12/03/20 03/03/21 Yes Zonia Kief, Amy J, NP  Insulin Pen Needle (PEN NEEDLES) 31G X 8 MM MISC UAD 12/03/20  Yes Zonia Kief, Amy J, NP  levothyroxine (SYNTHROID) 50 MCG tablet Take 1 tablet (50 mcg total) by mouth daily. 12/04/20 02/02/21 Yes Zonia Kief, Amy J, NP  metFORMIN (GLUCOPHAGE) 1000 MG tablet Take 1 tablet (1,000 mg total) by mouth 2 (two) times daily with a meal. 12/03/20 03/03/21 Yes Zonia Kief, Amy J, NP  furosemide (LASIX) 20 MG tablet Take 1 tablet (20 mg total) by mouth daily as needed for up to 14 days. 12/28/20 01/11/21  Eleonore Chiquito, FNP  lisinopril (ZESTRIL) 20 MG tablet Take 40 mg by mouth once a day. 12/28/20   Eleonore Chiquito, FNP     Objective:  EXAM:   Vitals:   12/28/20 0930  BP: (!) 182/69  Pulse: 65  Resp: 16  Temp: (!) 97.3 F (36.3 C)  SpO2: 95%  Weight: 210 lb (95.3 kg)  Height: 6' 1.5" (1.867 m)    General appearance : A&OX3. NAD. Non-toxic-appearing Neck: supple, no JVD. No cervical lymphadenopathy. No thyromegaly Chest/Lungs:  Breathing-non-labored, Good air entry bilaterally, breath sounds normal without rales, rhonchi, or wheezing  CVS: S1 S2 regular, no murmurs, gallops, rubs  Abdomen: Bowel sounds present, Non tender  and not distended with no gaurding, rigidity or rebound. Extremities: Bilateral Lower Ext shows +2-3 edema, both legs are warm to touch with = pulse throughout Neurology:  CN II-XII grossly intact, Non focal.   Psych:  TP linear. J/I WNL. Normal speech. Appropriate eye contact and affect.  Skin:  No Rash  Data Review Lab Results  Component Value Date   HGBA1C 7.6 (A) 12/03/2020   HGBA1C 10.2 (H) 03/13/2018     Assessment & Plan   1. Bilateral lower extremity edema -Follow-up in a month with primary care provider for bilateral lower  extremity edema - furosemide (LASIX) 20 MG tablet; Take 1 tablet (20 mg total) by mouth daily as needed for up to 14 days.  Dispense: 14 tablet; Refill: 0 -Call and make an appointment with your podiatry for nail care  2. Hypothyroidism, unspecified type -Take medications as prescribed -Commend to check T4 with TSH next time 45 days after the last TSH levels  3. Essential hypertension -Follow-up in 7 days with [facility] the pharmacist for blood pressure recheck -Due to blood pressure at home, hold any blood pressure over 140 over 80 to the clinic or ER.  Verbalized understanding - lisinopril (ZESTRIL) 20 MG tablet; Take 40 mg by mouth once a day.  Dispense: 60 tablet; Refill: 2     Patient have been counseled extensively about nutrition and exercise  Return in about 1 week (around 01/04/2021) for BP check with Franky Macho the Pharmacist..  The patient was given clear instructions to go to ER or return to medical center if symptoms don't improve, worsen or new problems develop. The patient verbalized understanding. The patient was told to call to get lab results if they haven't heard anything in the next week.     Eleonore Chiquito, APRN, FNP-C [facility] [place], [place] [phone]   12/28/2020, 10:02 AM

## 2020-12-28 NOTE — Progress Notes (Signed)
Follow up on thyroid BLE edema- out of lasix

## 2020-12-31 ENCOUNTER — Encounter (INDEPENDENT_AMBULATORY_CARE_PROVIDER_SITE_OTHER): Payer: Medicare HMO | Admitting: Ophthalmology

## 2020-12-31 ENCOUNTER — Other Ambulatory Visit: Payer: Self-pay

## 2020-12-31 DIAGNOSIS — H35033 Hypertensive retinopathy, bilateral: Secondary | ICD-10-CM

## 2020-12-31 DIAGNOSIS — E113513 Type 2 diabetes mellitus with proliferative diabetic retinopathy with macular edema, bilateral: Secondary | ICD-10-CM

## 2020-12-31 DIAGNOSIS — I10 Essential (primary) hypertension: Secondary | ICD-10-CM

## 2020-12-31 DIAGNOSIS — H43813 Vitreous degeneration, bilateral: Secondary | ICD-10-CM

## 2021-01-04 ENCOUNTER — Ambulatory Visit: Payer: Medicare HMO | Attending: Family Medicine | Admitting: Pharmacist

## 2021-01-04 ENCOUNTER — Encounter: Payer: Self-pay | Admitting: Pharmacist

## 2021-01-04 ENCOUNTER — Other Ambulatory Visit: Payer: Self-pay

## 2021-01-04 VITALS — BP 145/73 | HR 71

## 2021-01-04 DIAGNOSIS — I10 Essential (primary) hypertension: Secondary | ICD-10-CM

## 2021-01-04 NOTE — Progress Notes (Signed)
   S:    Patient arrives in good spirits. Presents to the clinic for hypertension evaluation, counseling, and management. Patient was referred and last seen by Primary Care Provider on 12/28/2020. Lisinopril dose was increased to 40 mg daily/   Medication adherence reported. Denies any missed doses. Has taken today.   Denies chest pain/HA. Blurred vision at baseline d/t cataracts. Does have baseline dyspnea but denies any changes from his baseline.   Current BP Medications include:  amlodipine 10 mg daily, lisinopril 40 mg daily **takes Lasix prn for LE edema  Dietary habits include: admits to drinking caffeine throughout the day. Has tried to eliminate salt. Avoids pork mostly. Will season some of his food with salt.  Exercise habits include: active at home. Does yardwork.  Family / Social history:  -Fhx: CHF, DM  -Tobacco: former smoker (quit 1981). Current everyday chewing tobacco user.   -Alcohol: none reported   O:  Vitals:   01/04/21 1608  BP: (!) 145/73  Pulse: 71   Home BP readings: has a cuff but thinks this is inaccurate. No readings provided today.   Last 3 Office BP readings: BP Readings from Last 3 Encounters:  01/04/21 (!) 145/73  12/28/20 (!) 182/69  12/03/20 (!) 166/73    BMET    Component Value Date/Time   NA 139 12/03/2020 1531   K 4.3 12/03/2020 1531   CL 97 12/03/2020 1531   CO2 24 12/03/2020 1531   GLUCOSE 140 (H) 12/03/2020 1531   GLUCOSE 200 (H) 03/18/2018 0320   BUN 16 12/03/2020 1531   CREATININE 1.08 12/03/2020 1531   CALCIUM 10.0 12/03/2020 1531   GFRNONAA >60 03/18/2018 0320   GFRAA >60 03/18/2018 0320    Renal function: CrCl cannot be calculated (Patient's most recent lab result is older than the maximum 21 days allowed.).  Clinical ASCVD: No  The ASCVD Risk score Denman George DC Jr., et al., 2013) failed to calculate for the following reasons:   The valid total cholesterol range is 130 to 320 mg/dL   A/P: Hypertension longstanding  currently above goal but improving on current medications. BP Goal = < 130/80 mmHg. Medication adherence reported. He has labs due in two weeks and already has an appt. If electrolyte status and renal function is stable, recommend to add thiazide diuretic if improved BP control is needed.  -Continued current regimen.  -F/u labs ordered - labs are planned for 01/15/2021 with his PCP.  -Counseled on lifestyle modifications for blood pressure control including reduced dietary sodium, increased exercise, adequate sleep.  Results reviewed and written information provided.   Total time in face-to-face counseling 30 minutes.   F/U Clinic Visit in 1 month.   Butch Penny, PharmD, Patsy [place], CPP Clinical Pharmacist [facility] [phone]

## 2021-01-15 ENCOUNTER — Other Ambulatory Visit: Payer: Medicare HMO

## 2021-01-15 ENCOUNTER — Other Ambulatory Visit: Payer: Self-pay | Admitting: Family

## 2021-01-15 ENCOUNTER — Other Ambulatory Visit: Payer: Self-pay

## 2021-01-15 DIAGNOSIS — Z1329 Encounter for screening for other suspected endocrine disorder: Secondary | ICD-10-CM

## 2021-01-16 LAB — TSH+T4F+T3FREE
Free T4: 1.56 ng/dL (ref 0.82–1.77)
T3, Free: 3.1 pg/mL (ref 2.0–4.4)
TSH: 3.95 u[IU]/mL (ref 0.450–4.500)

## 2021-01-16 NOTE — Progress Notes (Signed)
Please call patient with update.   Thyroid function back to normal and encouraged to recheck in 6 months. Continue Levothyroxine 50 mcg daily.

## 2021-02-04 ENCOUNTER — Encounter: Payer: Self-pay | Admitting: Pharmacist

## 2021-02-04 ENCOUNTER — Ambulatory Visit: Payer: Medicare HMO | Attending: Family | Admitting: Pharmacist

## 2021-02-04 ENCOUNTER — Other Ambulatory Visit: Payer: Self-pay

## 2021-02-04 VITALS — BP 153/69

## 2021-02-04 DIAGNOSIS — I10 Essential (primary) hypertension: Secondary | ICD-10-CM

## 2021-02-04 MED ORDER — CHLORTHALIDONE 25 MG PO TABS: 12.5000 mg | ORAL_TABLET | Freq: Every day | ORAL | 0 refills | Status: DC

## 2021-02-04 NOTE — Progress Notes (Signed)
   S:    Patient arrives in good spirits. Presents to the clinic for hypertension evaluation, counseling, and management. Patient was referred and last seen by Primary Care Provider on 12/28/2020 where lisinopril dose was increased to 40 mg daily. Last seen by pharmacy clinic on 01/04/2021 and no changes to HTN regimen were made as blood pressure had much improved from PCP's visit (145/73 vs. 182/69).   Medication adherence reported. Denies any missed doses. Has taken today.   Denies chest pain/HA. Blurred vision at baseline d/t cataracts. Does have baseline dyspnea but denies any changes from his baseline.   Current BP Medications include:  amlodipine 10 mg daily, lisinopril 40 mg daily **takes Lasix prn for LE edema  Dietary habits include: admits to drinking caffeine throughout the day. Has tried to eliminate salt. Avoids pork mostly. Will season some of his food with salt.  Exercise habits include: active at home. Does yardwork.  Family / Social history:  -Fhx: CHF, DM  -Tobacco: former smoker (quit 1981). Current everyday chewing tobacco user.   -Alcohol: none reported   O:  Vitals:   02/04/21 0958 02/04/21 1017  BP: (!) 169/70 (!) 153/69    Home BP readings: has a cuff but thinks this is inaccurate. No readings provided today.   Last 3 Office BP readings: BP Readings from Last 3 Encounters:  02/04/21 (!) 153/69  01/04/21 (!) 145/73  12/28/20 (!) 182/69    BMET    Component Value Date/Time   NA 139 12/03/2020 1531   K 4.3 12/03/2020 1531   CL 97 12/03/2020 1531   CO2 24 12/03/2020 1531   GLUCOSE 140 (H) 12/03/2020 1531   GLUCOSE 200 (H) 03/18/2018 0320   BUN 16 12/03/2020 1531   CREATININE 1.08 12/03/2020 1531   CALCIUM 10.0 12/03/2020 1531   GFRNONAA >60 03/18/2018 0320   GFRAA >60 03/18/2018 0320    Renal function: CrCl cannot be calculated (Patient's most recent lab result is older than the maximum 21 days allowed.).  Clinical ASCVD: No  The ASCVD Risk score  (Arnett DK, et al., 2019) failed to calculate for the following reasons:   The valid total cholesterol range is 130 to 320 mg/dL   A/P: Hypertension longstanding, systolic blood pressure currently above goal and diastolic blood pressure is at goal for both BP readings. BP Goal = < 130/80 mmHg. Medication adherence reported. Office BP readings are consistent with isolated systolic hypertension. Chlorthalidone may provide greater antihypertensive effect and reduce cardiovascular events and mortality compared with hydrochlorothiazide. Data from the [facility] trial for isolated systolic hypertension and the ALLHAT trial for CV events showed that chlorthalidone reduced stroke, heart failure, and CV events.  -Start chlorthalidone 12.5 mg QAM -Continued lisinopril 40 mg daily -Continued amlodipine 10 mg daily -F/u BMET at next PCP visit on 03/08/2021 -Counseled on lifestyle modifications for blood pressure control including reduced dietary sodium, increased exercise, adequate sleep.  Results reviewed and written information provided.   Total time in face-to-face counseling 30 minutes.   F/U PCP Clinic Visit in 1 month.   Rushie Goltz, Pharm.D. PGY-1 Pharmacy Resident Phone:[phone] 02/04/2021 10:17 AM

## 2021-02-11 ENCOUNTER — Encounter (INDEPENDENT_AMBULATORY_CARE_PROVIDER_SITE_OTHER): Payer: Medicare HMO | Admitting: Ophthalmology

## 2021-02-11 ENCOUNTER — Other Ambulatory Visit: Payer: Self-pay

## 2021-02-11 DIAGNOSIS — I10 Essential (primary) hypertension: Secondary | ICD-10-CM

## 2021-02-11 DIAGNOSIS — H43813 Vitreous degeneration, bilateral: Secondary | ICD-10-CM

## 2021-02-11 DIAGNOSIS — E113513 Type 2 diabetes mellitus with proliferative diabetic retinopathy with macular edema, bilateral: Secondary | ICD-10-CM | POA: Diagnosis not present

## 2021-02-11 DIAGNOSIS — H35033 Hypertensive retinopathy, bilateral: Secondary | ICD-10-CM

## 2021-03-08 ENCOUNTER — Ambulatory Visit: Payer: Medicare HMO | Admitting: Family Medicine

## 2021-03-08 ENCOUNTER — Ambulatory Visit: Payer: Medicare HMO | Admitting: Family

## 2021-03-08 NOTE — Progress Notes (Signed)
Patient ID: Ferry Matthis, male    DOB: [birth year]  MRN: [id number]  CC: Diabetes Follow-Up  Subjective: Anias Bartol is a 75 y.o. male who presents for diabetes follow-up.  His concerns today include:  DIABETES TYPE 2 FOLLOW-UP: 12/03/2020: - Hemoglobin A1c at goal today at 7.6%, goal < 8%. This is improved from previous hemoglobin A1c of 10.2% on 03/13/2018. Next hemoglobin A1c due October 2022.  - Continue Metformin and Insulin NPH Human as prescribed.   03/15/2021: Reports recently had some visitors at his home. Enjoyed coconut cake for several days. Doing well on current regimen. Reports he knew A1c would be higher than last time on today.  2. HYPERTENSION FOLLOW-UP: 02/04/2021 at [facility] per clinical pharmacist note: Hypertension longstanding, systolic blood pressure currently above goal and diastolic blood pressure is at goal for both BP readings. BP Goal = < 130/80 mmHg. Medication adherence reported. Office BP readings are consistent with isolated systolic hypertension. Chlorthalidone may provide greater antihypertensive effect and reduce cardiovascular events and mortality compared with hydrochlorothiazide. Data from the [employer] trial for isolated systolic hypertension and the ALLHAT trial for CV events showed that chlorthalidone reduced stroke, heart failure, and CV events.  -Start chlorthalidone 12.5 mg QAM -Continued lisinopril 40 mg daily -Continued amlodipine 10 mg daily -F/u BMET at next PCP visit on 03/08/2021 -Counseled on lifestyle modifications for blood pressure control including reduced dietary sodium, increased exercise, adequate sleep.   03/15/2021: Doing well on current regimen. No side effects. No issues/concerns. Denies chest pain and shortness of breath. Monitoring blood pressures at home sometimes.    Patient Active Problem List   Diagnosis Date Noted   Poorly-controlled hypertension 04/05/2019   Conductive  hearing loss 04/05/2019   Chewing tobacco use 04/05/2019   Bilateral impacted cerumen 04/05/2019   Primary hyperparathyroidism (HCC) 11/21/2018   Chronic respiratory failure with hypoxia and hypercapnia (HCC) 11/18/2018   Chronic obstructive asthma (with obstructive pulmonary disease) (HCC) 06/09/2018   Other secondary pulmonary hypertension (HCC) 06/09/2018   Healthcare maintenance 03/30/2018   Serum total bilirubin elevated 03/26/2018   Asbestos exposure 03/26/2018   Lesion of skin of scalp 03/26/2018   Coronary artery disease involving native coronary artery of native heart without angina pectoris 03/26/2018   Acquired hypothyroidism 03/26/2018   Pleural plaque 03/14/2018   Diabetes (HCC) 03/14/2018   Somnolence    Hyponatremia    Abnormal chest x-ray    Hypertension associated with diabetes (HCC) 07/18/2014   Mixed hyperlipidemia 07/18/2014   Diabetic retinopathy associated with type 2 diabetes mellitus (HCC) 07/18/2014     Current Outpatient Medications on File Prior to Visit  Medication Sig Dispense Refill   aspirin EC 81 MG EC tablet Take 1 tablet (81 mg total) by mouth daily. 90 tablet 0   Cholecalciferol (VITAMIN D-3) 25 MCG (1000 UT) CAPS Take by mouth.     Cyanocobalamin 2500 MCG CHEW Chew by mouth daily.     atorvastatin (LIPITOR) 40 MG tablet Take 1 tablet (40 mg total) by mouth daily at 6 PM. 90 tablet 0   furosemide (LASIX) 20 MG tablet Take 1 tablet (20 mg total) by mouth daily as needed for up to 14 days. 14 tablet 0   levothyroxine (SYNTHROID) 50 MCG tablet Take 1 tablet (50 mcg total) by mouth daily. 60 tablet 0   No current facility-administered medications on file prior to visit.    No Known Allergies  Social History   Socioeconomic History  Marital status: Married    Spouse name: Not on file   Number of children: Not on file   Years of education: Not on file   Highest education level: Not on file  Occupational History   Not on file  Tobacco Use    Smoking status: Former    Packs/day: 0.50    Years: 37.00    Pack years: 18.50    Types: Cigarettes    Quit date: 03/30/1980    Years since quitting: 40.9   Smokeless tobacco: Current    Types: Chew, Snuff  Vaping Use   Vaping Use: Never used  Substance and Sexual Activity   Alcohol use: No   Drug use: No   Sexual activity: Not on file  Other Topics Concern   Not on file  Social History Narrative   Not on file   Social Determinants of Health   Financial Resource Strain: Not on file  Food Insecurity: Not on file  Transportation Needs: Not on file  Physical Activity: Not on file  Stress: Not on file  Social Connections: Not on file  Intimate Partner Violence: Not on file    Family History  Problem Relation Age of Onset   Heart failure Mother    Cancer Father    Diabetes Sister    Cancer Sister     Past Surgical History:  Procedure Laterality Date   APPENDECTOMY     RIGHT/LEFT HEART CATH AND CORONARY ANGIOGRAPHY N/A 03/17/2018   Procedure: RIGHT/LEFT HEART CATH AND CORONARY ANGIOGRAPHY;  Surgeon: Orpah Cobb, MD;  Location: [facility] INVASIVE CV LAB;  Service: Cardiovascular;  Laterality: N/A;    ROS: Review of Systems Negative except as stated above  PHYSICAL EXAM: BP (!) 167/81   Pulse 71   Temp 97.8 F (36.6 C) (Oral)   Ht 6\' 1"  (1.854 m)   Wt 194 lb 9.6 oz (88.3 kg)   SpO2 96%   BMI 25.67 kg/m   Physical Exam HENT:     Head: Normocephalic and atraumatic.  Eyes:     Extraocular Movements: Extraocular movements intact.     Conjunctiva/sclera: Conjunctivae normal.     Pupils: Pupils are equal, round, and reactive to light.  Cardiovascular:     Rate and Rhythm: Normal rate and regular rhythm.     Pulses: Normal pulses.     Heart sounds: Normal heart sounds.  Pulmonary:     Effort: Pulmonary effort is normal.     Breath sounds: Normal breath sounds.  Musculoskeletal:     Cervical back: Normal range of motion and neck supple.  Neurological:      General: No focal deficit present.     Mental Status: He is alert and oriented to person, place, and time.  Psychiatric:        Mood and Affect: Mood normal.        Behavior: Behavior normal.   Results for orders placed or performed in visit on 03/15/21  POCT glycosylated hemoglobin (Hb A1C)  Result Value Ref Range   Hemoglobin A1C     HbA1c POC (<> result, manual entry)     HbA1c, POC (prediabetic range)     HbA1c, POC (controlled diabetic range) 8.3 (A) 0.0 - 7.0 %     ASSESSMENT AND PLAN: 1. Type 2 diabetes mellitus with retinopathy, with long-term current use of insulin, macular edema presence unspecified, unspecified laterality, unspecified retinopathy severity (HCC): - Hemoglobin A1c today close to goal at 8.3%, goal < 8%. This is increased  from previous of 7.6% on 12/03/2020. Next due January 2023.  - Patient endorses dietary indiscretion.  - Continue Metformin as prescribed.  - Increase Insulin NPH Human from 30 units twice daily to 33 units twice daily.  - Discussed the importance of healthy eating habits, low-carbohydrate diet, low-sugar diet, regular aerobic exercise (at least 150 minutes a week as tolerated) and medication compliance to achieve or maintain control of diabetes. - BMP to evaluate kidney function and electrolyte balance. - Follow-up with primary provider in 3 months or sooner if needed.  - POCT glycosylated hemoglobin (Hb A1C) - Basic Metabolic Panel - insulin NPH Human (NOVOLIN N) 100 UNIT/ML injection; Inject 0.33 mLs (33 Units total) into the skin 2 (two) times daily before a meal.  Dispense: 59.4 mL; Refill: 0 - Insulin Pen Needle (PEN NEEDLES) 31G X 8 MM MISC; Twice daily.  Dispense: 100 each; Refill: 1 - metFORMIN (GLUCOPHAGE) 1000 MG tablet; Take 1 tablet (1,000 mg total) by mouth 2 (two) times daily with a meal.  Dispense: 180 tablet; Refill: 0  2. Hypertension, isolated systolic: - Continue Amlodipine and Lisinopril as prescribed.  - Increase  Chlorthalidone from 12.5 mg daily to 25 mg daily.  - Counseled on blood pressure goal of less than 140/90, low-sodium, DASH diet, medication compliance, 150 minutes of moderate intensity exercise per week as tolerated. Discussed medication compliance, adverse effects. - Referral to Cardiology for further evaluation and management.  - Follow-up with primary care in 2 weeks or sooner if needed for blood pressure check. - Ambulatory referral to Cardiology - chlorthalidone (HYGROTON) 25 MG tablet; Take 1 tablet (25 mg total) by mouth daily.  Dispense: 30 tablet; Refill: 0 - amLODipine (NORVASC) 10 MG tablet; Take 1 tablet (10 mg total) by mouth daily.  Dispense: 90 tablet; Refill: 0 - lisinopril (ZESTRIL) 20 MG tablet; Take 40 mg by mouth once a day.  Dispense: 90 tablet; Refill: 0    Patient was given the opportunity to ask questions.  Patient verbalized understanding of the plan and was able to repeat key elements of the plan. Patient was given clear instructions to go to Emergency Department or return to medical center if symptoms don't improve, worsen, or new problems develop.The patient verbalized understanding.   Orders Placed This Encounter  Procedures   Basic Metabolic Panel   Ambulatory referral to Cardiology   POCT glycosylated hemoglobin (Hb A1C)     Requested Prescriptions   Signed Prescriptions Disp Refills   chlorthalidone (HYGROTON) 25 MG tablet 30 tablet 0    Sig: Take 1 tablet (25 mg total) by mouth daily.   amLODipine (NORVASC) 10 MG tablet 90 tablet 0    Sig: Take 1 tablet (10 mg total) by mouth daily.   lisinopril (ZESTRIL) 20 MG tablet 90 tablet 0    Sig: Take 40 mg by mouth once a day.   insulin NPH Human (NOVOLIN N) 100 UNIT/ML injection 59.4 mL 0    Sig: Inject 0.33 mLs (33 Units total) into the skin 2 (two) times daily before a meal.   Insulin Pen Needle (PEN NEEDLES) 31G X 8 MM MISC 100 each 1    Sig: Twice daily.   metFORMIN (GLUCOPHAGE) 1000 MG tablet 180  tablet 0    Sig: Take 1 tablet (1,000 mg total) by mouth 2 (two) times daily with a meal.    Return in about 3 months (around 06/15/2021) for Follow-Up or next available diabetes and 2 weeks blood pressure check with CMA .  Camillia Herter, NP

## 2021-03-15 ENCOUNTER — Other Ambulatory Visit: Payer: Self-pay

## 2021-03-15 ENCOUNTER — Ambulatory Visit (INDEPENDENT_AMBULATORY_CARE_PROVIDER_SITE_OTHER): Payer: Medicare HMO | Admitting: Family

## 2021-03-15 ENCOUNTER — Encounter: Payer: Self-pay | Admitting: Family

## 2021-03-15 VITALS — BP 167/81 | HR 71 | Temp 97.8°F | Ht 73.0 in | Wt 194.6 lb

## 2021-03-15 DIAGNOSIS — Z794 Long term (current) use of insulin: Secondary | ICD-10-CM

## 2021-03-15 DIAGNOSIS — E11319 Type 2 diabetes mellitus with unspecified diabetic retinopathy without macular edema: Secondary | ICD-10-CM | POA: Diagnosis not present

## 2021-03-15 DIAGNOSIS — I10 Essential (primary) hypertension: Secondary | ICD-10-CM | POA: Diagnosis not present

## 2021-03-15 LAB — POCT GLYCOSYLATED HEMOGLOBIN (HGB A1C): HbA1c, POC (controlled diabetic range): 8.3 % — AB (ref 0.0–7.0)

## 2021-03-15 MED ORDER — METFORMIN HCL 1000 MG PO TABS: 1000.0000 mg | ORAL_TABLET | Freq: Two times a day (BID) | ORAL | 0 refills | Status: DC

## 2021-03-15 MED ORDER — AMLODIPINE BESYLATE 10 MG PO TABS: 10.0000 mg | ORAL_TABLET | Freq: Every day | ORAL | 0 refills | Status: DC

## 2021-03-15 MED ORDER — INSULIN NPH (HUMAN) (ISOPHANE) 100 UNIT/ML ~~LOC~~ SUSP: 33.0000 [IU] | Freq: Two times a day (BID) | SUBCUTANEOUS | 0 refills | Status: DC

## 2021-03-15 MED ORDER — LISINOPRIL 20 MG PO TABS: ORAL_TABLET | ORAL | 0 refills | Status: DC

## 2021-03-15 MED ORDER — CHLORTHALIDONE 25 MG PO TABS: 25.0000 mg | ORAL_TABLET | Freq: Every day | ORAL | 0 refills | Status: DC

## 2021-03-15 MED ORDER — PEN NEEDLES 31G X 8 MM MISC: 1 refills | Status: DC

## 2021-03-15 NOTE — Patient Instructions (Signed)

## 2021-03-18 ENCOUNTER — Encounter (INDEPENDENT_AMBULATORY_CARE_PROVIDER_SITE_OTHER): Payer: Medicare HMO | Admitting: Ophthalmology

## 2021-03-29 ENCOUNTER — Other Ambulatory Visit: Payer: Self-pay

## 2021-03-29 ENCOUNTER — Encounter: Payer: Self-pay | Admitting: Internal Medicine

## 2021-03-29 ENCOUNTER — Ambulatory Visit: Payer: Medicare HMO | Admitting: Internal Medicine

## 2021-03-29 ENCOUNTER — Ambulatory Visit: Payer: Medicare HMO

## 2021-03-29 VITALS — BP 144/70 | HR 77 | Ht 73.0 in | Wt 203.4 lb

## 2021-03-29 DIAGNOSIS — I2510 Atherosclerotic heart disease of native coronary artery without angina pectoris: Secondary | ICD-10-CM | POA: Diagnosis not present

## 2021-03-29 DIAGNOSIS — I152 Hypertension secondary to endocrine disorders: Secondary | ICD-10-CM

## 2021-03-29 DIAGNOSIS — E1159 Type 2 diabetes mellitus with other circulatory complications: Secondary | ICD-10-CM

## 2021-03-29 DIAGNOSIS — E782 Mixed hyperlipidemia: Secondary | ICD-10-CM | POA: Diagnosis not present

## 2021-03-29 DIAGNOSIS — E11319 Type 2 diabetes mellitus with unspecified diabetic retinopathy without macular edema: Secondary | ICD-10-CM | POA: Diagnosis not present

## 2021-03-29 DIAGNOSIS — Z794 Long term (current) use of insulin: Secondary | ICD-10-CM

## 2021-03-29 DIAGNOSIS — J449 Chronic obstructive pulmonary disease, unspecified: Secondary | ICD-10-CM | POA: Diagnosis not present

## 2021-03-29 MED ORDER — NEBIVOLOL HCL 2.5 MG PO TABS: 2.5000 mg | ORAL_TABLET | Freq: Every day | ORAL | 3 refills | Status: DC

## 2021-03-29 MED ORDER — NITROGLYCERIN 0.4 MG SL SUBL: 0.4000 mg | SUBLINGUAL_TABLET | SUBLINGUAL | 3 refills | Status: AC | PRN

## 2021-03-29 MED ORDER — EMPAGLIFLOZIN 10 MG PO TABS: 10.0000 mg | ORAL_TABLET | Freq: Every day | ORAL | 6 refills | Status: DC

## 2021-03-29 NOTE — Progress Notes (Signed)
Cardiology Office Note:    Date:  04/01/2021   ID:  Robert Ramirez, DOB [birth year], MRN [id number]  PCP:  Rema Fendt, NP   [facility] Providers Cardiologist:  Orbie Pyo, MD     Referring MD: Rema Fendt, NP   Chief Complaint: Hypertension  History of Present Illness:    PROBLEM LIST: 1.  Mild coronary artery disease on cardiac catheterization 2019 with moderate right coronary artery lesion 2.  Type 2 diabetes 3.  Hypertension 4.  Hyperlipidemia  Robert Ramirez is a 75 y.o. male with the indicated medical problems here for recommendations regarding hypertension.  The patient's had high blood pressure for a long time.  By his own admission he has been eating a lot of salty foods.  Over the last few months he has curtailed his intake of these foods including pork and breakfast sausages.  His blood pressure has improved.  Today his blood pressure is much improved versus prior.  Notably he denies any significant exertional angina.  He has chronic shortness of breath which is relatively unchanged versus previous.  He had had a bout of respiratory failure in 2019.  He uses BiPAP at night and sleeps in a recliner because he has issues with desaturation when he lays flat.  This has been a chronic issue.  He has longstanding chronic peripheral edema which she tells me is started before he was on amlodipine.  He denies any presyncope, syncope, palpitations, signs or symptoms of stroke, or severe bleeding.    Previous Medical/Surgical History:    Past Medical History:  Diagnosis Date   Diabetes mellitus    High cholesterol    Hypertension     Past Surgical History:  Procedure Laterality Date   APPENDECTOMY     RIGHT/LEFT HEART CATH AND CORONARY ANGIOGRAPHY N/A 03/17/2018   Procedure: RIGHT/LEFT HEART CATH AND CORONARY ANGIOGRAPHY;  Surgeon: Orpah Cobb, MD;  Location: [facility] INVAS[facility];  Service: Cardiovascular;  Laterality: N/A;    Current Medications: Current  Meds  Medication Sig   amLODipine (NORVASC) 10 MG tablet Take 1 tablet (10 mg total) by mouth daily.   aspirin EC 81 MG EC tablet Take 1 tablet (81 mg total) by mouth daily.   atorvastatin (LIPITOR) 40 MG tablet Take 1 tablet (40 mg total) by mouth daily at 6 PM.   chlorthalidone (HYGROTON) 25 MG tablet Take 1 tablet (25 mg total) by mouth daily.   Cholecalciferol (VITAMIN D-3) 25 MCG (1000 UT) CAPS Take by mouth.   Cyanocobalamin 2500 MCG TABS Chew by mouth daily.   empagliflozin (JARDIANCE) 10 MG TABS tablet Take 1 tablet (10 mg total) by mouth daily.   insulin NPH Human (NOVOLIN N) 100 UNIT/ML injection Inject 0.33 mLs (33 Units total) into the skin 2 (two) times daily before a meal.   Insulin Pen Needle (PEN NEEDLES) 31G X 8 MM MISC Twice daily.   levothyroxine (SYNTHROID) 50 MCG tablet Take 1 tablet (50 mcg total) by mouth daily.   lisinopril (ZESTRIL) 20 MG tablet Take 40 mg by mouth once a day.   metFORMIN (GLUCOPHAGE) 1000 MG tablet Take 1 tablet (1,000 mg total) by mouth 2 (two) times daily with a meal.   nebivolol (BYSTOLIC) 2.5 MG tablet Take 1 tablet (2.5 mg total) by mouth daily.   nitroGLYCERIN (NITROSTAT) 0.4 MG SL tablet Place 1 tablet (0.4 mg total) under the tongue every 5 (five) minutes as needed for chest pain.   [DISCONTINUED] furosemide (LASIX) 20  MG tablet Take 1 tablet (20 mg total) by mouth daily as needed for up to 14 days.     Allergies:   Patient has no known allergies.   Social History:    Social History   Socioeconomic History   Marital status: Married    Spouse name: Not on file   Number of children: Not on file   Years of education: Not on file   Highest education level: Not on file  Occupational History   Not on file  Tobacco Use   Smoking status: Former    Packs/day: 0.50    Years: 37.00    Pack years: 18.50    Types: Cigarettes    Quit date: 03/30/1980    Years since quitting: 41.0   Smokeless tobacco: Current    Types: Chew, Snuff  Vaping  Use   Vaping Use: Never used  Substance and Sexual Activity   Alcohol use: No   Drug use: No   Sexual activity: Not on file  Other Topics Concern   Not on file  Social History Narrative   Not on file   Social Determinants of Health   Financial Resource Strain: Not on file  Food Insecurity: Not on file  Transportation Needs: Not on file  Physical Activity: Not on file  Stress: Not on file  Social Connections: Not on file     Family History:  The patient's family history includes Cancer in his father and sister; Diabetes in his sister; Heart failure in his mother.  ROS:  Please see the history of present illness.  All other systems reviewed and are negative.  EKGs/Labs/Other Studies Reviewed:    The following studies were reviewed today: Previous EKGs demonstrate sinus rhythm with right bundle branch block he occasionally has first-degree block as well  EKG:   The ekg ordered today demonstrates sinus rhythm with a first-degree AV block and right bundle branch block  Recent Labs: 12/03/2020: ALT 13; BUN 16; Creatinine, Ser 1.08; Hemoglobin 14.5; Platelets 216; Potassium 4.3; Sodium 139 01/15/2021: TSH 3.950   Recent Lipid Panel    Component Value Date/Time   CHOL 114 12/03/2020 1531   TRIG 87 12/03/2020 1531   HDL 43 12/03/2020 1531   CHOLHDL 2.7 12/03/2020 1531   LDLCALC 54 12/03/2020 1531     Risk Assessment/Calculations:           Physical Exam:    VS:  BP (!) 144/70   Pulse 77   Ht 6\' 1"  (1.854 m)   Wt 203 lb 6.4 oz (92.3 kg)   SpO2 99%   BMI 26.84 kg/m     Wt Readings from Last 3 Encounters:  03/29/21 203 lb 6.4 oz (92.3 kg)  03/15/21 194 lb 9.6 oz (88.3 kg)  12/28/20 210 lb (95.3 kg)     GEN:  Well nourished, well developed in no acute distress HEENT: Normal NECK: No JVD; No carotid bruits LYMPHATICS: No lymphadenopathy CARDIAC: RRR, no murmurs, rubs, gallops RESPIRATORY:  Clear to auscultation without rales, wheezing or rhonchi  ABDOMEN:  Soft, non-tender, non-distended MUSCULOSKELETAL:  +2 edema; No deformity  SKIN: Warm and dry NEUROLOGIC:  Alert and oriented x 3 PSYCHIATRIC:  Normal affect   ASSESSMENT and PLAN   Hypertension associated with diabetes (HCC) The patient was recently seen and had their lisinopril intensified.  They are also on amlodipine 10 mg. Will start Bystolic 2.5mg  qday and have the patient follow with pharmacy here for tititration.  I will see the patient  back in 6 months.  Coronary artery disease involving native coronary artery of native heart without angina pectoris Continue aspirin and statin.  Will prescribe PRN NTG.  Mixed hyperlipidemia The patient is on a statin followed by primary care provider.  His last LDL was at goal at 54.  Diabetes [facility]) This is being followed by patient's primary care provider.  Will start Jardiance 10mg  qday.              Medication Adjustments/Labs and Tests Ordered: Current medicines are reviewed at length with the patient today.  Concerns regarding medicines are outlined above.  Orders Placed This Encounter  Procedures   AMB Referral to Heartcare Pharm-D   EKG 12-Lead    Meds ordered this encounter  Medications   empagliflozin (JARDIANCE) 10 MG TABS tablet    Sig: Take 1 tablet (10 mg total) by mouth daily.    Dispense:  30 tablet    Refill:  6   nebivolol (BYSTOLIC) 2.5 MG tablet    Sig: Take 1 tablet (2.5 mg total) by mouth daily.    Dispense:  90 tablet    Refill:  3   nitroGLYCERIN (NITROSTAT) 0.4 MG SL tablet    Sig: Place 1 tablet (0.4 mg total) under the tongue every 5 (five) minutes as needed for chest pain.    Dispense:  25 tablet    Refill:  3     Patient Instructions  Medication Instructions:  Your physician has recommended you make the following change in your medication:  1.) start Jardiance 10 mg -one tablet daily 2.) start Bystolic 2.5 mg - one tablet daily 3.) nitroglycerin 0.4 mg - take as directed  *If you need a  refill on your cardiac medications before your next appointment, please call your pharmacy*   Lab Work: none If you have labs (blood work) drawn today and your tests are completely normal, you will receive your results only by: MyChart Message (if you have MyChart) OR A paper copy in the mail If you have any lab test that is abnormal or we need to change your treatment, we will call you to review the results.   Testing/Procedures: none   Follow-Up: At [facility], you and your health needs are our priority.  As part of our continuing mission to provide you with exceptional heart care, we have created designated Provider Care Teams.  These Care Teams include your primary Cardiologist (physician) and Advanced Practice Providers (APPs -  Physician Assistants and Nurse Practitioners) who all work together to provide you with the care you need, when you need it.  We recommend signing up for the patient portal called "MyChart".  Sign up information is provided on this After Visit Summary.  MyChart is used to connect with patients for Virtual Visits (Telemedicine).  Patients are able to view lab/test results, encounter notes, upcoming appointments, etc.  Non-urgent messages can be sent to your provider as well.   To learn more about what you can do with MyChart, go to [facility].    Your next appointment:   6 month(s)  The format for your next appointment:   In Person  Provider:   [email or web address], MD [facility]    Other Instructions You have been referred to Hypertension Clinic Pharmacist.  Please make an appointment for in about 4 weeks.      Signed, [facility], MD  04/01/2021 7:14 AM    [facility]

## 2021-03-29 NOTE — Assessment & Plan Note (Addendum)
This is being followed by patient's primary care provider.  Will start Jardiance 10mg  qday.

## 2021-03-29 NOTE — Assessment & Plan Note (Addendum)
The patient is on a statin followed by primary care provider.  His last LDL was at goal at 54.

## 2021-03-29 NOTE — Assessment & Plan Note (Signed)
Continue aspirin and statin.  Will prescribe PRN NTG.

## 2021-03-29 NOTE — Assessment & Plan Note (Addendum)
The patient was recently seen and had their lisinopril intensified.  They are also on amlodipine 10 mg. Will start Bystolic 2.5mg  qday and have the patient follow with pharmacy here for tititration.  I will see the patient back in 6 months.

## 2021-03-29 NOTE — Patient Instructions (Signed)
Medication Instructions:  Your physician has recommended you make the following change in your medication:  1.) start Jardiance 10 mg -one tablet daily 2.) start Bystolic 2.5 mg - one tablet daily 3.) nitroglycerin 0.4 mg - take as directed  *If you need a refill on your cardiac medications before your next appointment, please call your pharmacy*   Lab Work: none If you have labs (blood work) drawn today and your tests are completely normal, you will receive your results only by: MyChart Message (if you have MyChart) OR A paper copy in the mail If you have any lab test that is abnormal or we need to change your treatment, we will call you to review the results.   Testing/Procedures: none   Follow-Up: At [facility], you and your health needs are our priority.  As part of our continuing mission to provide you with exceptional heart care, we have created designated Provider Care Teams.  These Care Teams include your primary Cardiologist (physician) and Advanced Practice Providers (APPs -  Physician Assistants and Nurse Practitioners) who all work together to provide you with the care you need, when you need it.  We recommend signing up for the patient portal called "MyChart".  Sign up information is provided on this After Visit Summary.  MyChart is used to connect with patients for Virtual Visits (Telemedicine).  Patients are able to view lab/test results, encounter notes, upcoming appointments, etc.  Non-urgent messages can be sent to your provider as well.   To learn more about what you can do with MyChart, go to [email or web address].    Your next appointment:   6 month(s)  The format for your next appointment:   In Person  Provider:   Orbie Pyo, MD [facility]    Other Instructions You have been referred to Hypertension Clinic Pharmacist.  Please make an appointment for in about 4 weeks.

## 2021-04-02 NOTE — Addendum Note (Signed)
Addended by: Alverda Skeans on: 04/02/2021 12:08 PM   Modules accepted: Level of Service

## 2021-04-03 DIAGNOSIS — G4733 Obstructive sleep apnea (adult) (pediatric): Secondary | ICD-10-CM | POA: Diagnosis not present

## 2021-04-08 ENCOUNTER — Other Ambulatory Visit: Payer: Self-pay

## 2021-04-08 ENCOUNTER — Encounter (INDEPENDENT_AMBULATORY_CARE_PROVIDER_SITE_OTHER): Payer: Medicare HMO | Admitting: Ophthalmology

## 2021-04-08 DIAGNOSIS — H35371 Puckering of macula, right eye: Secondary | ICD-10-CM

## 2021-04-08 DIAGNOSIS — E113511 Type 2 diabetes mellitus with proliferative diabetic retinopathy with macular edema, right eye: Secondary | ICD-10-CM

## 2021-04-08 DIAGNOSIS — H43813 Vitreous degeneration, bilateral: Secondary | ICD-10-CM

## 2021-04-08 DIAGNOSIS — E113592 Type 2 diabetes mellitus with proliferative diabetic retinopathy without macular edema, left eye: Secondary | ICD-10-CM | POA: Diagnosis not present

## 2021-04-08 DIAGNOSIS — I10 Essential (primary) hypertension: Secondary | ICD-10-CM

## 2021-04-08 DIAGNOSIS — H35033 Hypertensive retinopathy, bilateral: Secondary | ICD-10-CM

## 2021-05-03 ENCOUNTER — Other Ambulatory Visit: Payer: Self-pay

## 2021-05-03 ENCOUNTER — Ambulatory Visit: Payer: Medicare HMO | Admitting: Pharmacist

## 2021-05-03 VITALS — BP 140/68 | HR 72 | Wt 200.0 lb

## 2021-05-03 DIAGNOSIS — E785 Hyperlipidemia, unspecified: Secondary | ICD-10-CM

## 2021-05-03 DIAGNOSIS — I10 Essential (primary) hypertension: Secondary | ICD-10-CM

## 2021-05-03 MED ORDER — ATORVASTATIN CALCIUM 40 MG PO TABS: 40.0000 mg | ORAL_TABLET | Freq: Every day | ORAL | 3 refills | Status: DC

## 2021-05-03 MED ORDER — CHLORTHALIDONE 25 MG PO TABS: 25.0000 mg | ORAL_TABLET | Freq: Every day | ORAL | 3 refills | Status: DC

## 2021-05-03 MED ORDER — LISINOPRIL 40 MG PO TABS: ORAL_TABLET | ORAL | 3 refills | Status: DC

## 2021-05-03 NOTE — Patient Instructions (Addendum)
It was nice to see you today!  Your blood pressure goal is < 130/2mmHg  Limit your daily sodium to < 2,000mg   Change to part decaf coffee to help lower your blood pressure  We will check labs today and if they're stable, will start you on spironolactone for your blood pressure  I'll call you Monday with your lab results and plan  Continue to monitor your blood pressure at home. Please bring in your home cuff and log of readings to your next appointment. I'll schedule this when we chat on Monday with your results

## 2021-05-03 NOTE — Progress Notes (Signed)
Patient ID: Robert Ramirez                 DOB: 1945-06-16                      MRN: [id number]     HPI: Robert Ramirez is a 75 y.o. male referred by Dr. Lynnette Caffey to HTN clinic. PMH is significant for HTN, HLD, T2DM, mild CAD on 2019 cardiac cath with moderate RCA lesion. First seen by cardiology 03/29/21 for uncontrolled HTN. Had worked to cut back sodium in his diet, BP remained elevated at 144/70. He was started on Bystolic 2.5mg  daily as well as Jardiance for his DM.  Pt presents today in good spirits. Reports he picked up Jardiance at the pharmacy but not Bystolic because there was an issue. Jardiance copay $45. Bystolic also a Tier 3 med on his plan. Has been tolerating Jardiance well. Has chronic LE edema that preceded him starting amlodipine but reports improvement in swelling in his ankles and has been able to fit into his shoes much better. Denies headaches and dizziness but does have some balance issues he reports started after his respiratory failure. A few of his BP meds have expired on his profile and have no refills. Pt reports adherence with all meds at home and that he hasn't run out of any of them. Is taking 2 lisinopril 20mg  tablets each day instead of a 40mg  tablet. Has checked BP once or twice in the past month, doesn't recall specific readings. Keeps an eye on his glucose but doesn't recall specific readings. Denies any hypotensive episodes.  Has still been working to cut back on sodium intake. Cut back on pork and sausage. Stopped adding salt to all of his food but still uses some. Drinks 2 regular pots of coffee a day (10-12 cups in each pot) but reports adding 1 scoop of coffee grounds to each pot. Does have chronic SOB. Uses BiPAP at night and sleeps in a recliner. Does not use NSAIDs. Has not needed any NTG.  Current HTN meds: amlodipine 10mg  daily (AM), chlorthalidone 25mg  daily (AM), lisinopril 40mg  daily (AM)  BP goal: <130/73mmHg  Family History: Cancer in his father and  sister; Diabetes in his sister; Heart failure in his mother.  Social History: Former tobacco use 1/2 PPD for 37 years, quit in 1981. Denies drug and alcohol use.   Exercise: Stays active with yardwork, has chickens  Wt Readings from Last 3 Encounters:  03/29/21 203 lb 6.4 oz (92.3 kg)  03/15/21 194 lb 9.6 oz (88.3 kg)  12/28/20 210 lb (95.3 kg)   BP Readings from Last 3 Encounters:  03/29/21 (!) 144/70  03/15/21 (!) 167/81  02/04/21 (!) 153/69   Pulse Readings from Last 3 Encounters:  03/29/21 77  03/15/21 71  01/04/21 71    Renal function: CrCl cannot be calculated (Patient's most recent lab result is older than the maximum 21 days allowed.).  Past Medical History:  Diagnosis Date   Diabetes mellitus    High cholesterol    Hypertension     Current Outpatient Medications on File Prior to Visit  Medication Sig Dispense Refill   amLODipine (NORVASC) 10 MG tablet Take 1 tablet (10 mg total) by mouth daily. 90 tablet 0   aspirin EC 81 MG EC tablet Take 1 tablet (81 mg total) by mouth daily. 90 tablet 0   atorvastatin (LIPITOR) 40 MG tablet Take 1 tablet (40 mg total) by mouth daily at  6 PM. 90 tablet 0   chlorthalidone (HYGROTON) 25 MG tablet Take 1 tablet (25 mg total) by mouth daily. 30 tablet 0   Cholecalciferol (VITAMIN D-3) 25 MCG (1000 UT) CAPS Take by mouth.     Cyanocobalamin 2500 MCG TABS Chew by mouth daily.     empagliflozin (JARDIANCE) 10 MG TABS tablet Take 1 tablet (10 mg total) by mouth daily. 30 tablet 6   insulin NPH Human (NOVOLIN N) 100 UNIT/ML injection Inject 0.33 mLs (33 Units total) into the skin 2 (two) times daily before a meal. 59.4 mL 0   Insulin Pen Needle (PEN NEEDLES) 31G X 8 MM MISC Twice daily. 100 each 1   levothyroxine (SYNTHROID) 50 MCG tablet Take 1 tablet (50 mcg total) by mouth daily. 60 tablet 0   lisinopril (ZESTRIL) 20 MG tablet Take 40 mg by mouth once a day. 90 tablet 0   metFORMIN (GLUCOPHAGE) 1000 MG tablet Take 1 tablet (1,000 mg  total) by mouth 2 (two) times daily with a meal. 180 tablet 0   nebivolol (BYSTOLIC) 2.5 MG tablet Take 1 tablet (2.5 mg total) by mouth daily. 90 tablet 3   nitroGLYCERIN (NITROSTAT) 0.4 MG SL tablet Place 1 tablet (0.4 mg total) under the tongue every 5 (five) minutes as needed for chest pain. 25 tablet 3   No current facility-administered medications on file prior to visit.    No Known Allergies   Assessment/Plan:  1. Hypertension - BP elevated above goal < 130/4mmHg. Will check BMET today since pt has been on Jardiance for a month which has helped improve his chronic LE edema. Checking BMET today with recent SGLT2i start. If labs are stable, will add spironolactone. Will continue amlodipine 10mg  daily, lisinopril 40mg  daily (changed his rx from 2 20mg  tabs to 1 40mg  tab each day), and chlorthalidone 25mg  daily. He's been taking all meds in the AM so advised him to move amlodipine and lisinopril to PM dosing. Will keep chlorthalidone in AM and plan to add spironolactone in the morning. Pt aware I'll call Monday with his lab results to finalize his med plan. Encouraged him to change to partially decaf coffee and limit daily sodium intake to < 2,000mg . Encouraged him to monitor BP at home and bring in log/readings to next visit which I'll schedule when med plan is finalized.  Callin Ashe E. Maygen Sirico, PharmD, BCACP, CPP [facility] [address], [place],  Phone: [phone]; Fax: [phone] 05/03/2021 4:04 PM

## 2021-05-04 LAB — BASIC METABOLIC PANEL
BUN/Creatinine Ratio: 23 (ref 10–24)
BUN: 31 mg/dL — ABNORMAL HIGH (ref 8–27)
CO2: 29 mmol/L (ref 20–29)
Calcium: 10.6 mg/dL — ABNORMAL HIGH (ref 8.6–10.2)
Chloride: 101 mmol/L (ref 96–106)
Creatinine, Ser: 1.36 mg/dL — ABNORMAL HIGH (ref 0.76–1.27)
Glucose: 160 mg/dL — ABNORMAL HIGH (ref 70–99)
Potassium: 4.6 mmol/L (ref 3.5–5.2)
Sodium: 142 mmol/L (ref 134–144)
eGFR: 55 mL/min/{1.73_m2} — ABNORMAL LOW (ref >59)

## 2021-05-06 ENCOUNTER — Telehealth: Payer: Self-pay | Admitting: Pharmacist

## 2021-05-06 DIAGNOSIS — I10 Essential (primary) hypertension: Secondary | ICD-10-CM

## 2021-05-06 NOTE — Telephone Encounter (Signed)
SCr increased by ~20% since Jardiance was started 1 month ago. Ok with up to 30% increase, should stabilize within the first 3 months of therapy. Will not start spironolactone yet for HTN until renal function stabilizes. Will recheck BMET in another 3 weeks then reassess. Pt aware of lab results. He does not use NSAIDs and was advised to stay hydrated.

## 2021-05-07 ENCOUNTER — Other Ambulatory Visit: Payer: Self-pay

## 2021-05-07 ENCOUNTER — Encounter (INDEPENDENT_AMBULATORY_CARE_PROVIDER_SITE_OTHER): Payer: Medicare HMO | Admitting: Ophthalmology

## 2021-05-07 DIAGNOSIS — I10 Essential (primary) hypertension: Secondary | ICD-10-CM

## 2021-05-07 DIAGNOSIS — H35033 Hypertensive retinopathy, bilateral: Secondary | ICD-10-CM

## 2021-05-07 DIAGNOSIS — H43813 Vitreous degeneration, bilateral: Secondary | ICD-10-CM

## 2021-05-07 DIAGNOSIS — E113511 Type 2 diabetes mellitus with proliferative diabetic retinopathy with macular edema, right eye: Secondary | ICD-10-CM

## 2021-05-07 DIAGNOSIS — E113592 Type 2 diabetes mellitus with proliferative diabetic retinopathy without macular edema, left eye: Secondary | ICD-10-CM

## 2021-05-07 DIAGNOSIS — H35371 Puckering of macula, right eye: Secondary | ICD-10-CM

## 2021-05-28 ENCOUNTER — Other Ambulatory Visit: Payer: Self-pay

## 2021-05-28 ENCOUNTER — Other Ambulatory Visit: Payer: Medicare HMO | Admitting: *Deleted

## 2021-05-28 DIAGNOSIS — I10 Essential (primary) hypertension: Secondary | ICD-10-CM

## 2021-05-28 LAB — BASIC METABOLIC PANEL
BUN/Creatinine Ratio: 27 — ABNORMAL HIGH (ref 10–24)
BUN: 33 mg/dL — ABNORMAL HIGH (ref 8–27)
CO2: 28 mmol/L (ref 20–29)
Calcium: 10.8 mg/dL — ABNORMAL HIGH (ref 8.6–10.2)
Chloride: 94 mmol/L — ABNORMAL LOW (ref 96–106)
Creatinine, Ser: 1.21 mg/dL (ref 0.76–1.27)
Glucose: 185 mg/dL — ABNORMAL HIGH (ref 70–99)
Potassium: 4.7 mmol/L (ref 3.5–5.2)
Sodium: 138 mmol/L (ref 134–144)
eGFR: 63 mL/min/{1.73_m2} (ref >59)

## 2021-05-29 ENCOUNTER — Telehealth: Payer: Self-pay | Admitting: Pharmacist

## 2021-05-29 MED ORDER — SPIRONOLACTONE 25 MG PO TABS: 25.0000 mg | ORAL_TABLET | Freq: Every day | ORAL | 3 refills | Status: DC

## 2021-05-29 NOTE — Telephone Encounter (Signed)
BMET stable, SCr has improved with longer duration of SGLT2i. Ok to start spironolactone 25mg  daily for BP control. Pt is aware. F/u appt made for BP check and BMET in 4 weeks (pt wants rx sent to mail order so will take 1 week to arrive).

## 2021-06-07 NOTE — Progress Notes (Signed)
Patient ID: Robert Ramirez, male    DOB: 10/20/1945  MRN: [id number]  CC: Diabetes Follow-Up   Subjective: Robert Ramirez is a 76 y.o. male who presents for diabetes follow-up.   His concerns today include:  DIABETES TYPE 2 FOLLOW-UP: 03/15/2021: - Continue Metformin as prescribed.  - Increase Insulin NPH Human from 30 units twice daily to 33 units twice daily.   06/11/2021: Doing well on current regimen, no issues/concerns.    Patient Active Problem List   Diagnosis Date Noted   Poorly-controlled hypertension 04/05/2019   Conductive hearing loss 04/05/2019   Chewing tobacco use 04/05/2019   Bilateral impacted cerumen 04/05/2019   Primary hyperparathyroidism (HCC) 11/21/2018   Chronic respiratory failure with hypoxia and hypercapnia (HCC) 11/18/2018   Chronic obstructive asthma (with obstructive pulmonary disease) (HCC) 06/09/2018   Other secondary pulmonary hypertension (HCC) 06/09/2018   Healthcare maintenance 03/30/2018   Serum total bilirubin elevated 03/26/2018   Asbestos exposure 03/26/2018   Lesion of skin of scalp 03/26/2018   Coronary artery disease involving native coronary artery of native heart without angina pectoris 03/26/2018   Acquired hypothyroidism 03/26/2018   Pleural plaque 03/14/2018   Diabetes (HCC) 03/14/2018   Somnolence    Hyponatremia    Abnormal chest x-ray    Hypertension 07/18/2014   Mixed hyperlipidemia 07/18/2014   Diabetic retinopathy associated with type 2 diabetes mellitus (HCC) 07/18/2014     Current Outpatient Medications on File Prior to Visit  Medication Sig Dispense Refill   amLODipine (NORVASC) 10 MG tablet Take 1 tablet (10 mg total) by mouth daily. 90 tablet 0   aspirin EC 81 MG EC tablet Take 1 tablet (81 mg total) by mouth daily. 90 tablet 0   atorvastatin (LIPITOR) 40 MG tablet Take 1 tablet (40 mg total) by mouth daily at 6 PM. 90 tablet 3   chlorthalidone (HYGROTON) 25 MG tablet Take 1 tablet (25 mg total) by mouth  daily. 90 tablet 3   Cholecalciferol (VITAMIN D-3) 25 MCG (1000 UT) CAPS Take by mouth.     Cyanocobalamin 2500 MCG TABS Chew by mouth daily.     empagliflozin (JARDIANCE) 10 MG TABS tablet Take 1 tablet (10 mg total) by mouth daily. 30 tablet 6   levothyroxine (SYNTHROID) 50 MCG tablet Take 1 tablet (50 mcg total) by mouth daily. 60 tablet 0   lisinopril (ZESTRIL) 40 MG tablet Take 40 mg by mouth once a day. 90 tablet 3   nitroGLYCERIN (NITROSTAT) 0.4 MG SL tablet Place 1 tablet (0.4 mg total) under the tongue every 5 (five) minutes as needed for chest pain. 25 tablet 3   spironolactone (ALDACTONE) 25 MG tablet Take 1 tablet (25 mg total) by mouth daily. 90 tablet 3   No current facility-administered medications on file prior to visit.    No Known Allergies  Social History   Socioeconomic History   Marital status: Married    Spouse name: Not on file   Number of children: Not on file   Years of education: Not on file   Highest education level: Not on file  Occupational History   Not on file  Tobacco Use   Smoking status: Former    Packs/day: 0.50    Years: 37.00    Pack years: 18.50    Types: Cigarettes    Quit date: 03/30/1980    Years since quitting: 41.2   Smokeless tobacco: Current    Types: Chew, Snuff  Vaping Use   Vaping Use: Never used  Substance and Sexual Activity   Alcohol use: No   Drug use: No   Sexual activity: Not on file  Other Topics Concern   Not on file  Social History Narrative   Not on file   Social Determinants of Health   Financial Resource Strain: Not on file  Food Insecurity: Not on file  Transportation Needs: Not on file  Physical Activity: Not on file  Stress: Not on file  Social Connections: Not on file  Intimate Partner Violence: Not on file    Family History  Problem Relation Age of Onset   Heart failure Mother    Cancer Father    Diabetes Sister    Cancer Sister     Past Surgical History:  Procedure Laterality Date    APPENDECTOMY     RIGHT/LEFT HEART CATH AND CORONARY ANGIOGRAPHY N/A 03/17/2018   Procedure: RIGHT/LEFT HEART CATH AND CORONARY ANGIOGRAPHY;  Surgeon: Orpah CobbKadakia, Ajay, MD;  Location: [facility];  Service: Cardiovascular;  Laterality: N/A;    ROS: Review of Systems Negative except as stated above  PHYSICAL EXAM: BP 134/74 (BP Location: Left Arm, Patient Position: Sitting, Cuff Size: Normal)    Pulse 70    Temp 98.7 F (37.1 C)    Resp 18    Ht 6' 0.99" (1.854 m)    Wt 193 lb (87.5 kg)    SpO2 99%    BMI 25.47 kg/m   Physical Exam HENT:     Head: Normocephalic and atraumatic.  Eyes:     Extraocular Movements: Extraocular movements intact.     Conjunctiva/sclera: Conjunctivae normal.     Pupils: Pupils are equal, round, and reactive to light.  Cardiovascular:     Rate and Rhythm: Normal rate and regular rhythm.     Pulses: Normal pulses.     Heart sounds: Normal heart sounds.  Pulmonary:     Effort: Pulmonary effort is normal.     Breath sounds: Normal breath sounds.  Musculoskeletal:     Cervical back: Normal range of motion and neck supple.  Neurological:     General: No focal deficit present.     Mental Status: He is alert and oriented to person, place, and time.  Psychiatric:        Mood and Affect: Mood normal.        Behavior: Behavior normal.   Results for orders placed or performed in visit on 06/11/21  POCT glycosylated hemoglobin (Hb A1C)  Result Value Ref Range   Hemoglobin A1C 8.0 (A) 4.0 - 5.6 %   HbA1c POC (<> result, manual entry)     HbA1c, POC (prediabetic range)     HbA1c, POC (controlled diabetic range)       ASSESSMENT AND PLAN: 1. Type 2 diabetes mellitus with retinopathy, with long-term current use of insulin, macular edema presence unspecified, unspecified laterality, unspecified retinopathy severity (HCC): - Hemoglobin A1c today at goal at 8.0%, goal < 7%.  - Continue Metformin and Insulin NPH Human as prescribed.  - Discussed the  importance of healthy eating habits, low-carbohydrate diet, low-sugar diet, regular aerobic exercise (at least 150 minutes a week as tolerated) and medication compliance to achieve or maintain control of diabetes. - Follow-up with primary provider in 3 months or sooner if needed.  - POCT glycosylated hemoglobin (Hb A1C) - metFORMIN (GLUCOPHAGE) 1000 MG tablet; Take 1 tablet (1,000 mg total) by mouth 2 (two) times daily with a meal.  Dispense: 180 tablet; Refill: 0 - insulin NPH  Human (NOVOLIN N) 100 UNIT/ML injection; Inject 0.33 mLs (33 Units total) into the skin 2 (two) times daily before a meal.  Dispense: 59.4 mL; Refill: 0 - Insulin Pen Needle (PEN NEEDLES) 31G X 8 MM MISC; Twice daily.  Dispense: 100 each; Refill: 1    Patient was given the opportunity to ask questions.  Patient verbalized understanding of the plan and was able to repeat key elements of the plan. Patient was given clear instructions to go to Emergency Department or return to medical center if symptoms don't improve, worsen, or new problems develop.The patient verbalized understanding.   Orders Placed This Encounter  Procedures   POCT glycosylated hemoglobin (Hb A1C)     Requested Prescriptions   Signed Prescriptions Disp Refills   metFORMIN (GLUCOPHAGE) 1000 MG tablet 180 tablet 0    Sig: Take 1 tablet (1,000 mg total) by mouth 2 (two) times daily with a meal.   insulin NPH Human (NOVOLIN N) 100 UNIT/ML injection 59.4 mL 0    Sig: Inject 0.33 mLs (33 Units total) into the skin 2 (two) times daily before a meal.   Insulin Pen Needle (PEN NEEDLES) 31G X 8 MM MISC 100 each 1    Sig: Twice daily.    Return in about 3 months (around 09/09/2021) for Follow-Up or next available diabetes .  Rema Fendt, NP

## 2021-06-11 ENCOUNTER — Encounter (INDEPENDENT_AMBULATORY_CARE_PROVIDER_SITE_OTHER): Payer: Medicare HMO | Admitting: Ophthalmology

## 2021-06-11 ENCOUNTER — Other Ambulatory Visit: Payer: Self-pay

## 2021-06-11 ENCOUNTER — Ambulatory Visit (INDEPENDENT_AMBULATORY_CARE_PROVIDER_SITE_OTHER): Payer: Medicare HMO | Admitting: Family

## 2021-06-11 ENCOUNTER — Encounter: Payer: Self-pay | Admitting: Family

## 2021-06-11 VITALS — BP 134/74 | HR 70 | Temp 98.7°F | Resp 18 | Ht 72.99 in | Wt 193.0 lb

## 2021-06-11 DIAGNOSIS — H35371 Puckering of macula, right eye: Secondary | ICD-10-CM

## 2021-06-11 DIAGNOSIS — H43813 Vitreous degeneration, bilateral: Secondary | ICD-10-CM | POA: Diagnosis not present

## 2021-06-11 DIAGNOSIS — E11319 Type 2 diabetes mellitus with unspecified diabetic retinopathy without macular edema: Secondary | ICD-10-CM | POA: Diagnosis not present

## 2021-06-11 DIAGNOSIS — I10 Essential (primary) hypertension: Secondary | ICD-10-CM

## 2021-06-11 DIAGNOSIS — E113511 Type 2 diabetes mellitus with proliferative diabetic retinopathy with macular edema, right eye: Secondary | ICD-10-CM | POA: Diagnosis not present

## 2021-06-11 DIAGNOSIS — E113592 Type 2 diabetes mellitus with proliferative diabetic retinopathy without macular edema, left eye: Secondary | ICD-10-CM

## 2021-06-11 DIAGNOSIS — H35033 Hypertensive retinopathy, bilateral: Secondary | ICD-10-CM | POA: Diagnosis not present

## 2021-06-11 DIAGNOSIS — Z794 Long term (current) use of insulin: Secondary | ICD-10-CM

## 2021-06-11 LAB — POCT GLYCOSYLATED HEMOGLOBIN (HGB A1C): Hemoglobin A1C: 8 % — AB (ref 4.0–5.6)

## 2021-06-11 MED ORDER — PEN NEEDLES 31G X 8 MM MISC: 1 refills | Status: DC

## 2021-06-11 MED ORDER — INSULIN NPH (HUMAN) (ISOPHANE) 100 UNIT/ML ~~LOC~~ SUSP: 33.0000 [IU] | Freq: Two times a day (BID) | SUBCUTANEOUS | 0 refills | Status: DC

## 2021-06-11 MED ORDER — METFORMIN HCL 1000 MG PO TABS: 1000.0000 mg | ORAL_TABLET | Freq: Two times a day (BID) | ORAL | 0 refills | Status: DC

## 2021-06-11 NOTE — Progress Notes (Signed)
Diabetes discussed in office.

## 2021-06-11 NOTE — Progress Notes (Signed)
Pt presents for diabetes follow-up  

## 2021-06-14 ENCOUNTER — Ambulatory Visit: Payer: Medicare HMO | Admitting: Family

## 2021-06-20 ENCOUNTER — Other Ambulatory Visit: Payer: Self-pay | Admitting: Family

## 2021-06-20 ENCOUNTER — Ambulatory Visit: Payer: Medicare HMO

## 2021-06-20 DIAGNOSIS — E11319 Type 2 diabetes mellitus with unspecified diabetic retinopathy without macular edema: Secondary | ICD-10-CM

## 2021-06-20 DIAGNOSIS — I10 Essential (primary) hypertension: Secondary | ICD-10-CM

## 2021-06-21 ENCOUNTER — Other Ambulatory Visit: Payer: Self-pay

## 2021-06-21 DIAGNOSIS — I10 Essential (primary) hypertension: Secondary | ICD-10-CM

## 2021-06-21 MED ORDER — AMLODIPINE BESYLATE 10 MG PO TABS: 10.0000 mg | ORAL_TABLET | Freq: Every day | ORAL | 0 refills | Status: DC

## 2021-06-26 NOTE — Progress Notes (Signed)
Patient ID: Robert Ramirez                 DOB: 1945/08/30                      MRN: [id number]     HPI: Julius Matus is a 76 y.o. male referred by Dr. Lynnette Caffey to HTN clinic. PMH is significant for HTN, HLD, T2DM, mild CAD on 2019 cardiac cath with moderate RCA lesion. First seen by cardiology 03/29/21 for uncontrolled HTN. Had worked to cut back sodium in his diet, BP remained elevated at 144/70. MD started pt on Bystolic 2.5mg  daily as well as Jardiance for DM. At last visit with me, pt had been unable to pick up Bystolic due to insurance/cost issue, BP remained unchanged at 140/68 but SCr increased by about ~20% after adding SGLT2i. HTN med changes were deferred because of this and BMET was rechecked 3 weeks later. SCr improved with longer SGLT2i use and pt was started on spironolactone 25mg  daily for his HTN.  Pt presents today for follow up. Reports tolerating his meds well including new addition of spironolactone. Denies balance issues out of the ordinary. Took his meds about an hour ago, taking all HTN meds in the AM currently. Checked BP twice at home since last visit, both showed SBP in the 130s. Saw PCP on 1/17, BP was 134/74. Doesn't add salt to his food. Started mixing in decaf coffee with his regular coffee. Does have chronic SOB. Uses BiPAP at night and sleeps in a recliner. Does not use NSAIDs.   Current HTN meds: amlodipine 10mg  daily (AM), chlorthalidone 25mg  daily (AM), lisinopril 40mg  daily (AM), spironolactone 25mg  daily (AM)  BP goal: <130/58mmHg  Family History: Cancer in his father and sister; Diabetes in his sister; Heart failure in his mother.  Social History: Former tobacco use 1/2 PPD for 37 years, quit in 1981. Denies drug and alcohol use.   Exercise: Stays active with yardwork, has chickens  Diet: Has still been working to cut back on sodium intake. Cut back on pork and sausage. Stopped adding salt to all of his food. Drinks 1-2 pots of coffee a day - 10-12 cups in  each pot) but only adds 1 scoop of coffee grounds to each pot. Started making this 1/2 decaf since last visit.  Wt Readings from Last 3 Encounters:  06/11/21 193 lb (87.5 kg)  05/03/21 200 lb (90.7 kg)  03/29/21 203 lb 6.4 oz (92.3 kg)   BP Readings from Last 3 Encounters:  06/11/21 134/74  05/03/21 140/68  03/29/21 (!) 144/70   Pulse Readings from Last 3 Encounters:  06/11/21 70  05/03/21 72  03/29/21 77    Renal function: CrCl cannot be calculated (Patient's most recent lab result is older than the maximum 21 days allowed.).  Past Medical History:  Diagnosis Date   Diabetes mellitus    High cholesterol    Hypertension     Current Outpatient Medications on File Prior to Visit  Medication Sig Dispense Refill   amLODipine (NORVASC) 10 MG tablet Take 1 tablet (10 mg total) by mouth daily. 90 tablet 0   aspirin EC 81 MG EC tablet Take 1 tablet (81 mg total) by mouth daily. 90 tablet 0   atorvastatin (LIPITOR) 40 MG tablet Take 1 tablet (40 mg total) by mouth daily at 6 PM. 90 tablet 3   chlorthalidone (HYGROTON) 25 MG tablet Take 1 tablet (25 mg total) by mouth daily. 90  tablet 3   Cholecalciferol (VITAMIN D-3) 25 MCG (1000 UT) CAPS Take by mouth.     Cyanocobalamin 2500 MCG TABS Chew by mouth daily.     empagliflozin (JARDIANCE) 10 MG TABS tablet Take 1 tablet (10 mg total) by mouth daily. 30 tablet 6   insulin NPH Human (NOVOLIN N) 100 UNIT/ML injection Inject 0.33 mLs (33 Units total) into the skin 2 (two) times daily before a meal. 59.4 mL 0   Insulin Pen Needle (PEN NEEDLES) 31G X 8 MM MISC Twice daily. 100 each 1   levothyroxine (SYNTHROID) 50 MCG tablet Take 1 tablet (50 mcg total) by mouth daily. 60 tablet 0   lisinopril (ZESTRIL) 40 MG tablet Take 40 mg by mouth once a day. 90 tablet 3   metFORMIN (GLUCOPHAGE) 1000 MG tablet Take 1 tablet (1,000 mg total) by mouth 2 (two) times daily with a meal. 180 tablet 0   nitroGLYCERIN (NITROSTAT) 0.4 MG SL tablet Place 1 tablet  (0.4 mg total) under the tongue every 5 (five) minutes as needed for chest pain. 25 tablet 3   spironolactone (ALDACTONE) 25 MG tablet Take 1 tablet (25 mg total) by mouth daily. 90 tablet 3   No current facility-administered medications on file prior to visit.    No Known Allergies   Assessment/Plan:  1. Hypertension - BP improved and now at goal < 130/16mmHg. Will check BMET today with recent spironolactone start. Prior SCr increase due Jardiance start at November office visit. Will continue amlodipine 10mg  daily, lisinopril 40mg  daily, chlorthalidone 25mg  daily, and spironolactone 25mg  daily. Advised pt to move amlodipine and lisinopril to PM dosing to space apart timing of BP meds. F/u with PharmD as needed.   Lupita Rosales E. Leiyah Maultsby, PharmD, BCACP, CPP [facility] [address], [place],  Phone: [phone]; Fax: [phone] 06/26/2021 1:47 PM

## 2021-06-27 ENCOUNTER — Encounter: Payer: Self-pay | Admitting: Pharmacist

## 2021-06-27 ENCOUNTER — Ambulatory Visit: Payer: Medicare HMO | Admitting: Pharmacist

## 2021-06-27 ENCOUNTER — Other Ambulatory Visit: Payer: Self-pay

## 2021-06-27 VITALS — BP 128/66 | HR 83

## 2021-06-27 DIAGNOSIS — I10 Essential (primary) hypertension: Secondary | ICD-10-CM | POA: Diagnosis not present

## 2021-06-27 LAB — BASIC METABOLIC PANEL
BUN/Creatinine Ratio: 24 (ref 10–24)
BUN: 33 mg/dL — ABNORMAL HIGH (ref 8–27)
CO2: 27 mmol/L (ref 20–29)
Calcium: 10.4 mg/dL — ABNORMAL HIGH (ref 8.6–10.2)
Chloride: 101 mmol/L (ref 96–106)
Creatinine, Ser: 1.36 mg/dL — ABNORMAL HIGH (ref 0.76–1.27)
Glucose: 212 mg/dL — ABNORMAL HIGH (ref 70–99)
Potassium: 5.6 mmol/L — ABNORMAL HIGH (ref 3.5–5.2)
Sodium: 141 mmol/L (ref 134–144)
eGFR: 55 mL/min/{1.73_m2} — ABNORMAL LOW (ref >59)

## 2021-06-27 NOTE — Patient Instructions (Addendum)
It was nice to see you today!  Your blood pressure goal is < 130/73mmHg  Continue taking your blood pressure medications but move 2 of them to the evening: Morning: Chlorthalidone 25mg  - 1 tablet daily Spironolactone 25mg  - 1 tablet daily  Night: Amlodipine 10mg  - 1 tablet daily Lisinopril 40mg  - 1 tablet daily

## 2021-06-28 ENCOUNTER — Telehealth: Payer: Self-pay | Admitting: Pharmacist

## 2021-06-28 DIAGNOSIS — I10 Essential (primary) hypertension: Secondary | ICD-10-CM

## 2021-06-28 NOTE — Telephone Encounter (Signed)
Scr stable by K is 5.6 on spironolactone 25mg  daily. I have asked the patient to decrease spironolactone to 12.5mg  (1/2 tablet) and come back next Friday for another check. I have asked him to keep an eye on his blood pressure. If it starts to go back up to please call Friday.

## 2021-07-05 ENCOUNTER — Other Ambulatory Visit: Payer: Self-pay

## 2021-07-05 ENCOUNTER — Other Ambulatory Visit: Payer: Medicare HMO | Admitting: *Deleted

## 2021-07-05 DIAGNOSIS — I10 Essential (primary) hypertension: Secondary | ICD-10-CM

## 2021-07-05 LAB — BASIC METABOLIC PANEL
BUN/Creatinine Ratio: 27 — ABNORMAL HIGH (ref 10–24)
BUN: 37 mg/dL — ABNORMAL HIGH (ref 8–27)
CO2: 26 mmol/L (ref 20–29)
Calcium: 10.7 mg/dL — ABNORMAL HIGH (ref 8.6–10.2)
Chloride: 95 mmol/L — ABNORMAL LOW (ref 96–106)
Creatinine, Ser: 1.36 mg/dL — ABNORMAL HIGH (ref 0.76–1.27)
Glucose: 79 mg/dL (ref 70–99)
Potassium: 4.8 mmol/L (ref 3.5–5.2)
Sodium: 135 mmol/L (ref 134–144)
eGFR: 55 mL/min/{1.73_m2} — ABNORMAL LOW (ref >59)

## 2021-07-08 NOTE — Telephone Encounter (Signed)
Repeat labs with improved K of 4.8. Patient made aware. I have asked patient to continue taking spironolactone 12.5mg  daily. Reports BP ranging from just below 130 to just above it. Continue to monitor. If BP rises above 130 consistently- give [place] a call.

## 2021-07-09 MED ORDER — SPIRONOLACTONE 25 MG PO TABS: 12.5000 mg | ORAL_TABLET | Freq: Every day | ORAL | 3 refills | Status: DC

## 2021-07-09 NOTE — Telephone Encounter (Signed)
Med list has been updated with correct spironolactone dose of 12.5mg  daily.

## 2021-07-09 NOTE — Addendum Note (Signed)
Addended by: Reiana Poteet E on: 07/09/2021 07:44 AM   Modules accepted: Orders

## 2021-07-17 ENCOUNTER — Encounter (INDEPENDENT_AMBULATORY_CARE_PROVIDER_SITE_OTHER): Payer: Medicare HMO | Admitting: Ophthalmology

## 2021-07-17 ENCOUNTER — Other Ambulatory Visit: Payer: Self-pay

## 2021-07-17 DIAGNOSIS — H43813 Vitreous degeneration, bilateral: Secondary | ICD-10-CM

## 2021-07-17 DIAGNOSIS — I10 Essential (primary) hypertension: Secondary | ICD-10-CM | POA: Diagnosis not present

## 2021-07-17 DIAGNOSIS — E113592 Type 2 diabetes mellitus with proliferative diabetic retinopathy without macular edema, left eye: Secondary | ICD-10-CM

## 2021-07-17 DIAGNOSIS — H35033 Hypertensive retinopathy, bilateral: Secondary | ICD-10-CM | POA: Diagnosis not present

## 2021-07-17 DIAGNOSIS — E113511 Type 2 diabetes mellitus with proliferative diabetic retinopathy with macular edema, right eye: Secondary | ICD-10-CM | POA: Diagnosis not present

## 2021-07-17 DIAGNOSIS — H35371 Puckering of macula, right eye: Secondary | ICD-10-CM | POA: Diagnosis not present

## 2021-08-21 ENCOUNTER — Encounter (INDEPENDENT_AMBULATORY_CARE_PROVIDER_SITE_OTHER): Payer: Medicare HMO | Admitting: Ophthalmology

## 2021-08-21 DIAGNOSIS — H35371 Puckering of macula, right eye: Secondary | ICD-10-CM

## 2021-08-21 DIAGNOSIS — I10 Essential (primary) hypertension: Secondary | ICD-10-CM | POA: Diagnosis not present

## 2021-08-21 DIAGNOSIS — E113511 Type 2 diabetes mellitus with proliferative diabetic retinopathy with macular edema, right eye: Secondary | ICD-10-CM

## 2021-08-21 DIAGNOSIS — H43813 Vitreous degeneration, bilateral: Secondary | ICD-10-CM | POA: Diagnosis not present

## 2021-08-21 DIAGNOSIS — H35033 Hypertensive retinopathy, bilateral: Secondary | ICD-10-CM | POA: Diagnosis not present

## 2021-08-21 DIAGNOSIS — E113592 Type 2 diabetes mellitus with proliferative diabetic retinopathy without macular edema, left eye: Secondary | ICD-10-CM

## 2021-09-04 NOTE — Progress Notes (Signed)
? ? ?Patient ID: Robert Ramirez, male    DOB: 10/27/1945  MRN: [id number] ? ?CC: Diabetes Follow-Up ? ?Subjective: ?Robert Ramirez is a 76 y.o. male who presents for diabetes follow-up.  ? ?His concerns today include:  ?DIABETES TYPE 2 FOLLOW-UP: ?06/11/2021: ?- Hemoglobin A1c today at goal at 8.0%, goal < 7%.  ?- Continue Metformin and Insulin NPH Human as prescribed.  ? ?09/10/2021: ?Doing well on current regimen. No issues or concerns. Reports does forget to take evening dose of insulin sometimes especially if busy but does not double the dose of anything. ? ?Patient Active Problem List  ? Diagnosis Date Noted  ? Poorly-controlled hypertension 04/05/2019  ? Conductive hearing loss 04/05/2019  ? Chewing tobacco use 04/05/2019  ? Bilateral impacted cerumen 04/05/2019  ? Primary hyperparathyroidism (HCC) 11/21/2018  ? Chronic respiratory failure with hypoxia and hypercapnia (HCC) 11/18/2018  ? Chronic obstructive asthma (with obstructive pulmonary disease) (HCC) 06/09/2018  ? Other secondary pulmonary hypertension (HCC) 06/09/2018  ? Healthcare maintenance 03/30/2018  ? Serum total bilirubin elevated 03/26/2018  ? Asbestos exposure 03/26/2018  ? Lesion of skin of scalp 03/26/2018  ? Coronary artery disease involving native coronary artery of native heart without angina pectoris 03/26/2018  ? Acquired hypothyroidism 03/26/2018  ? Pleural plaque 03/14/2018  ? Diabetes (HCC) 03/14/2018  ? Somnolence   ? Hyponatremia   ? Abnormal chest x-ray   ? Hypertension 07/18/2014  ? Mixed hyperlipidemia 07/18/2014  ? Diabetic retinopathy associated with type 2 diabetes mellitus (HCC) 07/18/2014  ?  ? ?Current Outpatient Medications on File Prior to Visit  ?Medication Sig Dispense Refill  ? amLODipine (NORVASC) 10 MG tablet Take 1 tablet (10 mg total) by mouth daily. 90 tablet 0  ? aspirin EC 81 MG EC tablet Take 1 tablet (81 mg total) by mouth daily. 90 tablet 0  ? atorvastatin (LIPITOR) 40 MG tablet Take 1 tablet (40 mg total) by  mouth daily at 6 PM. 90 tablet 3  ? chlorthalidone (HYGROTON) 25 MG tablet Take 1 tablet (25 mg total) by mouth daily. 90 tablet 3  ? Cholecalciferol (VITAMIN D-3) 25 MCG (1000 UT) CAPS Take by mouth.    ? Cyanocobalamin 2500 MCG TABS Chew by mouth daily.    ? empagliflozin (JARDIANCE) 10 MG TABS tablet Take 1 tablet (10 mg total) by mouth daily. 30 tablet 6  ? levothyroxine (SYNTHROID) 50 MCG tablet Take 1 tablet (50 mcg total) by mouth daily. 60 tablet 0  ? lisinopril (ZESTRIL) 40 MG tablet Take 40 mg by mouth once a day. 90 tablet 3  ? nitroGLYCERIN (NITROSTAT) 0.4 MG SL tablet Place 1 tablet (0.4 mg total) under the tongue every 5 (five) minutes as needed for chest pain. 25 tablet 3  ? spironolactone (ALDACTONE) 25 MG tablet Take 0.5 tablets (12.5 mg total) by mouth daily. 45 tablet 3  ? ?No current facility-administered medications on file prior to visit.  ? ? ?No Known Allergies ? ?Social History  ? ?Socioeconomic History  ? Marital status: Married  ?  Spouse name: Not on file  ? Number of children: Not on file  ? Years of education: Not on file  ? Highest education level: Not on file  ?Occupational History  ? Not on file  ?Tobacco Use  ? Smoking status: Former  ?  Packs/day: 0.50  ?  Years: 37.00  ?  Pack years: 18.50  ?  Types: Cigarettes  ?  Quit date: 03/30/1980  ?  Years since quitting: 41.4  ?  Passive exposure: Never  ? Smokeless tobacco: Current  ?  Types: Chew, Snuff  ?Vaping Use  ? Vaping Use: Never used  ?Substance and Sexual Activity  ? Alcohol use: No  ? Drug use: No  ? Sexual activity: Not Currently  ?Other Topics Concern  ? Not on file  ?Social History Narrative  ? Not on file  ? ?Social Determinants of Health  ? ?Financial Resource Strain: Not on file  ?Food Insecurity: Not on file  ?Transportation Needs: Not on file  ?Physical Activity: Not on file  ?Stress: Not on file  ?Social Connections: Not on file  ?Intimate Partner Violence: Not on file  ? ? ?Family History  ?Problem Relation Age of  Onset  ? Heart failure Mother   ? Cancer Father   ? Diabetes Sister   ? Cancer Sister   ? ? ?Past Surgical History:  ?Procedure Laterality Date  ? APPENDECTOMY    ? RIGHT/LEFT HEART CATH AND CORONARY ANGIOGRAPHY N/A 03/17/2018  ? Procedure: RIGHT/LEFT HEART CATH AND CORONARY ANGIOGRAPHY;  Surgeon: Orpah CobbKadakia, Ajay, MD;  Location: [facility];  Service: Cardiovascular;  Laterality: N/A;  ? ? ?ROS: ?Review of Systems ?Negative except as stated above ? ?PHYSICAL EXAM: ?BP 120/71 (BP Location: Left Arm, Patient Position: Sitting, Cuff Size: Large)   Pulse 78   Temp 98.3 ?F (36.8 ?C)   Resp 18   Ht 6' 0.99" (1.854 m)   Wt 196 lb (88.9 kg)   SpO2 98%   BMI 25.86 kg/m?  ? ?Physical Exam ?HENT:  ?   Head: Normocephalic and atraumatic.  ?Eyes:  ?   Extraocular Movements: Extraocular movements intact.  ?   Conjunctiva/sclera: Conjunctivae normal.  ?   Pupils: Pupils are equal, round, and reactive to light.  ?Cardiovascular:  ?   Rate and Rhythm: Normal rate and regular rhythm.  ?   Pulses: Normal pulses.  ?   Heart sounds: Normal heart sounds.  ?Pulmonary:  ?   Effort: Pulmonary effort is normal.  ?   Breath sounds: Normal breath sounds.  ?Musculoskeletal:  ?   Cervical back: Normal range of motion and neck supple.  ?Neurological:  ?   General: No focal deficit present.  ?   Mental Status: He is alert and oriented to person, place, and time.  ?Psychiatric:     ?   Mood and Affect: Mood normal.     ?   Behavior: Behavior normal.  ? ?Results for orders placed or performed in visit on 09/10/21  ?POCT glycosylated hemoglobin (Hb A1C)  ?Result Value Ref Range  ? Hemoglobin A1C 8.0 (A) 4.0 - 5.6 %  ? HbA1c POC (<> result, manual entry)    ? HbA1c, POC (prediabetic range)    ? HbA1c, POC (controlled diabetic range)    ? ? ?ASSESSMENT AND PLAN: ?1. Type 2 diabetes mellitus with retinopathy, with long-term current use of insulin, macular edema presence unspecified, unspecified laterality, unspecified retinopathy severity  (HCC): ?- Hemoglobin A1c today at goal at 8.0%, goal 8%.  ?- Continue Metformin and Insulin NPH Human as prescribed.  ?- Discussed the importance of healthy eating habits, low-carbohydrate diet, low-sugar diet, regular aerobic exercise (at least 150 minutes a week as tolerated) and medication compliance to achieve or maintain control of diabetes. ?- Follow-up with primary provider in 4 months or sooner if needed.  ?- POCT glycosylated hemoglobin (Hb A1C) ?- metFORMIN (GLUCOPHAGE) 1000 MG tablet; Take 1 tablet (1,000 mg total) by mouth 2 (two)  times daily with a meal.  Dispense: 240 tablet; Refill: 0 ?- insulin NPH Human (NOVOLIN N) 100 UNIT/ML injection; Inject 0.33 mLs (33 Units total) into the skin 2 (two) times daily before a meal.  Dispense: 80 mL; Refill: 0 ?- Insulin Pen Needle (PEN NEEDLES) 31G X 8 MM MISC; Twice daily.  Dispense: 100 each; Refill: 1 ? ? ? ?Patient was given the opportunity to ask questions.  Patient verbalized understanding of the plan and was able to repeat key elements of the plan. Patient was given clear instructions to go to Emergency Department or return to medical center if symptoms don't improve, worsen, or new problems develop.The patient verbalized understanding. ? ? ?Orders Placed This Encounter  ?Procedures  ? POCT glycosylated hemoglobin (Hb A1C)  ? ? ?Requested Prescriptions  ? ?Signed Prescriptions Disp Refills  ? metFORMIN (GLUCOPHAGE) 1000 MG tablet 240 tablet 0  ?  Sig: Take 1 tablet (1,000 mg total) by mouth 2 (two) times daily with a meal.  ? insulin NPH Human (NOVOLIN N) 100 UNIT/ML injection 80 mL 0  ?  Sig: Inject 0.33 mLs (33 Units total) into the skin 2 (two) times daily before a meal.  ? Insulin Pen Needle (PEN NEEDLES) 31G X 8 MM MISC 100 each 1  ?  Sig: Twice daily.  ? ? ?Return in about 4 months (around 01/10/2022) for Follow-Up or next available diabetes . ? ?Rema Fendt, NP  ?

## 2021-09-10 ENCOUNTER — Encounter: Payer: Self-pay | Admitting: Family

## 2021-09-10 ENCOUNTER — Ambulatory Visit (INDEPENDENT_AMBULATORY_CARE_PROVIDER_SITE_OTHER): Payer: Medicare HMO | Admitting: Family

## 2021-09-10 VITALS — BP 120/71 | HR 78 | Temp 98.3°F | Resp 18 | Ht 72.99 in | Wt 196.0 lb

## 2021-09-10 DIAGNOSIS — Z794 Long term (current) use of insulin: Secondary | ICD-10-CM | POA: Diagnosis not present

## 2021-09-10 DIAGNOSIS — E11319 Type 2 diabetes mellitus with unspecified diabetic retinopathy without macular edema: Secondary | ICD-10-CM | POA: Diagnosis not present

## 2021-09-10 LAB — POCT GLYCOSYLATED HEMOGLOBIN (HGB A1C): Hemoglobin A1C: 8 % — AB (ref 4.0–5.6)

## 2021-09-10 MED ORDER — INSULIN NPH (HUMAN) (ISOPHANE) 100 UNIT/ML ~~LOC~~ SUSP: 33.0000 [IU] | Freq: Two times a day (BID) | SUBCUTANEOUS | 0 refills | Status: DC

## 2021-09-10 MED ORDER — METFORMIN HCL 1000 MG PO TABS: 1000.0000 mg | ORAL_TABLET | Freq: Two times a day (BID) | ORAL | 0 refills | Status: DC

## 2021-09-10 MED ORDER — PEN NEEDLES 31G X 8 MM MISC: 1 refills | Status: DC

## 2021-09-10 NOTE — Progress Notes (Signed)
Diabetes discussed in office.

## 2021-09-10 NOTE — Progress Notes (Signed)
Pt presents for diabetes follow-up  

## 2021-09-25 ENCOUNTER — Encounter (INDEPENDENT_AMBULATORY_CARE_PROVIDER_SITE_OTHER): Payer: Medicare HMO | Admitting: Ophthalmology

## 2021-09-25 DIAGNOSIS — E113511 Type 2 diabetes mellitus with proliferative diabetic retinopathy with macular edema, right eye: Secondary | ICD-10-CM | POA: Diagnosis not present

## 2021-09-25 DIAGNOSIS — H35033 Hypertensive retinopathy, bilateral: Secondary | ICD-10-CM | POA: Diagnosis not present

## 2021-09-25 DIAGNOSIS — H43813 Vitreous degeneration, bilateral: Secondary | ICD-10-CM

## 2021-09-25 DIAGNOSIS — I10 Essential (primary) hypertension: Secondary | ICD-10-CM

## 2021-09-25 DIAGNOSIS — E113592 Type 2 diabetes mellitus with proliferative diabetic retinopathy without macular edema, left eye: Secondary | ICD-10-CM

## 2021-10-01 NOTE — Progress Notes (Signed)
?Cardiology Office Note:   ? ?Date:  10/03/2021  ? ?ID:  Robert Ramirez, DOB [birth year], MRN [id number] ? ?PCP:  Camillia Herter, NP  ? ?[facility] ?Cardiologist:  Lenna Sciara, MD ?Referring MD: Camillia Herter, NP  ? ?Chief Complaint/Reason for Referral: Follow-up ? ?ASSESSMENT:   ? ?1. Type 2 diabetes mellitus with retinopathy, with long-term current use of insulin, macular edema presence unspecified, unspecified laterality, unspecified retinopathy severity ([place])   ?2. Hypertension associated with diabetes ([place])   ?3. Coronary artery disease involving native coronary artery of native heart without angina pectoris   ?4. Hyperlipidemia associated with type 2 diabetes mellitus ([place])   ?5. Aortic atherosclerosis ([place])   ?6. Bifascicular block   ? ? ?PLAN:   ? ?In order of problems listed above: ?1.  Type 2 diabetes:  Continue aspirin, atorvastatin, lisinopril, and Jardiance.  Follow-up in 6 months or earlier if needed. ?2.  Hypertension:BP is well controlled. ?3.  Coronary artery disease: Patient is not having any angina.  Continue current therapy. ?4.  Hyperlipidemia: This is being followed followed by the patient's primary care provider.  I will check LP(a). ?5.  Aortic atherosclerosis: Continue aspirin, statin, and strict blood pressure control. ?6.  Bifascicular block: The patient is relatively asymptomatic.  Of asked him to contact our office if he develops any dizziness or syncope not related to position change. ? ?     ? ?   ? ?Dispo:  Return in about 6 months (around 04/05/2022).  ? ?  ? ?Medication Adjustments/Labs and Tests Ordered: ?Current medicines are reviewed at length with the patient today.  Concerns regarding medicines are outlined above. ? ?The following changes have been made:  no change  ? ?Labs/tests ordered: ?Orders Placed This Encounter  ?Procedures  ? Lipoprotein A (LPA)  ? EKG 12-Lead  ? ? ?Medication Changes: ?No orders of the defined types were placed in this  encounter. ? ? ? ?Current medicines are reviewed at length with the patient today.  The patient does not have concerns regarding medicines. ? ? ?History of Present Illness:   ? ?FOCUSED PROBLEM LIST:   ?1.  Mild coronary artery disease on cardiac catheterization 2019 with moderate right coronary artery lesion ?2.  Type 2 diabetes ?3.  Hypertension ?4.  Hyperlipidemia ?5.  Obstructive sleep apnea on BiPAP ?6.  Bifascicular block consisting of very long first-degree AV block and right bundle branch block ?7.  Aortic atherosclerosis and coronary artery calcification seen on CT 2020 ? ?The patient is a 76 y.o. male with the indicated medical history here for routine follow-up. ? ?November 2022: The patient was seen for elevated blood pressure.  He was started on Bystolic.  Nitroglycerin as well as Vania Rea was also started.  He was seen by the pharmacy division and spironolactone was added. ? ?Today: The patient is doing very well.  He has noticed that his spironolactone and Jardiance have resulted in less peripheral edema.  His blood pressure is well controlled today.  He does get somewhat lightheaded when he goes from sitting to standing quickly.  He does not have presyncope or syncope otherwise. ? ?   ? ? ? ?Current Medications: ?No outpatient medications have been marked as taking for the 10/03/21 encounter (Office Visit) with Early Osmond, MD.  ?  ? ?Allergies:    ?Patient has no known allergies.  ? ?Social History:   ?Social History  ? ?Tobacco Use  ? Smoking status: Former  ?  Packs/day: 0.50  ?  Years: 37.00  ?  Pack years: 18.50  ?  Types: Cigarettes  ?  Quit date: 03/30/1980  ?  Years since quitting: 41.5  ?  Passive exposure: Never  ? Smokeless tobacco: Current  ?  Types: Chew, Snuff  ?Vaping Use  ? Vaping Use: Never used  ?Substance Use Topics  ? Alcohol use: No  ? Drug use: No  ?  ? ?Family Hx: ?Family History  ?Problem Relation Age of Onset  ? Heart failure Mother   ? Cancer Father   ? Diabetes Sister    ? Cancer Sister   ?  ? ?Review of Systems:   ?Please see the history of present illness.    ?All other systems reviewed and are negative. ?  ? ? ?EKGs/Labs/Other Test Reviewed:   ? ?EKG:  EKG performed today that I personally reviewed demonstrates first-degree AV block with right bundle branch block. ? ?Prior CV studies: ? ?Cardiac catheterization 09/29/2017 with mild RCA disease ? ?TTE Sep 29, 2017 with ejection fraction of 60 to 65% with no significant wall motion abnormalities with D-shaped interventricular septum suggestive of RV pressure overload ? ?Other studies Reviewed: ?Review of the additional studies/records demonstrates: CT Sep 30, 2018 with coronary artery calcification and aortic atherosclerosis ? ?Recent Labs: ?12/03/2020: ALT 13; Hemoglobin 14.5; Platelets 216 ?01/15/2021: TSH 3.950 ?07/05/2021: BUN 37; Creatinine, Ser 1.36; Potassium 4.8; Sodium 135  ? ?Recent Lipid Panel ?Lab Results  ?Component Value Date/Time  ? CHOL 114 12/03/2020 03:31 PM  ? TRIG 87 12/03/2020 03:31 PM  ? HDL 43 12/03/2020 03:31 PM  ? LDLCALC 54 12/03/2020 03:31 PM  ? ? ?Risk Assessment/Calculations:   ? ? ?    ? ?Physical Exam:   ? ?VS:  BP 108/62   Pulse 76   Ht 6' (1.829 m)   Wt 201 lb 9.6 oz (91.4 kg)   SpO2 97%   BMI 27.34 kg/m?    ?Wt Readings from Last 3 Encounters:  ?10/03/21 201 lb 9.6 oz (91.4 kg)  ?09/10/21 196 lb (88.9 kg)  ?06/11/21 193 lb (87.5 kg)  ?  ?GENERAL:  No apparent distress, AOx3 ?HEENT:  No carotid bruits, +2 carotid impulses, no scleral icterus ?CAR: RRR no murmurs, gallops, rubs, or thrills ?RES:  Clear to auscultation bilaterally ?ABD:  Soft, nontender, nondistended, positive bowel sounds x 4 ?VASC:  +2 radial pulses, +2 carotid pulses, palpable pedal pulses ?NEURO:  CN 2-12 grossly intact; motor and sensory grossly intact ?PSYCH:  No active depression or anxiety ?EXT:  No edema, ecchymosis, or cyanosis ? ?Signed, ?Early Osmond, MD  ?10/03/2021 11:38 AM    ?[facility] ?[address],  [place], [place]  [address] ?Phone: [phone]; Fax: [phone]  ? ?Note:  This document was prepared using Dragon voice recognition software and may include unintentional dictation errors. ?

## 2021-10-03 ENCOUNTER — Encounter: Payer: Self-pay | Admitting: Internal Medicine

## 2021-10-03 ENCOUNTER — Ambulatory Visit: Payer: Medicare HMO | Admitting: Internal Medicine

## 2021-10-03 VITALS — BP 108/62 | HR 76 | Ht 72.0 in | Wt 201.6 lb

## 2021-10-03 DIAGNOSIS — Z794 Long term (current) use of insulin: Secondary | ICD-10-CM

## 2021-10-03 DIAGNOSIS — E785 Hyperlipidemia, unspecified: Secondary | ICD-10-CM | POA: Diagnosis not present

## 2021-10-03 DIAGNOSIS — I2510 Atherosclerotic heart disease of native coronary artery without angina pectoris: Secondary | ICD-10-CM | POA: Diagnosis not present

## 2021-10-03 DIAGNOSIS — E1169 Type 2 diabetes mellitus with other specified complication: Secondary | ICD-10-CM | POA: Diagnosis not present

## 2021-10-03 DIAGNOSIS — I152 Hypertension secondary to endocrine disorders: Secondary | ICD-10-CM | POA: Diagnosis not present

## 2021-10-03 DIAGNOSIS — E1159 Type 2 diabetes mellitus with other circulatory complications: Secondary | ICD-10-CM

## 2021-10-03 DIAGNOSIS — I452 Bifascicular block: Secondary | ICD-10-CM | POA: Diagnosis not present

## 2021-10-03 DIAGNOSIS — E11319 Type 2 diabetes mellitus with unspecified diabetic retinopathy without macular edema: Secondary | ICD-10-CM

## 2021-10-03 DIAGNOSIS — I700 Atherosclerosis of aorta: Secondary | ICD-10-CM | POA: Diagnosis not present

## 2021-10-03 NOTE — Patient Instructions (Signed)
Medication Instructions:  ?No changes ?*If you need a refill on your cardiac medications before your next appointment, please call your pharmacy* ? ? ?Lab Work: ?Today: Lp(a) ? ?If you have labs (blood work) drawn today and your tests are completely normal, you will receive your results only by: ?MyChart Message (if you have MyChart) OR ?A paper copy in the mail ?If you have any lab test that is abnormal or we need to change your treatment, we will call you to review the results. ? ? ?Testing/Procedures: ?none ? ? ?Follow-Up: ?At [facility], you and your health needs are our priority.  As part of our continuing mission to provide you with exceptional heart care, we have created designated Provider Care Teams.  These Care Teams include your primary Cardiologist (physician) and Advanced Practice Providers (APPs -  Physician Assistants and Nurse Practitioners) who all work together to provide you with the care you need, when you need it. ? ?We recommend signing up for the patient portal called "MyChart".  Sign up information is provided on this After Visit Summary.  MyChart is used to connect with patients for Virtual Visits (Telemedicine).  Patients are able to view lab/test results, encounter notes, upcoming appointments, etc.  Non-urgent messages can be sent to your provider as well.   ?To learn more about what you can do with MyChart, go to [email or web address].   ? ?Your next appointment:   ?6 month(s) ? ?The format for your next appointment:   ?In Person ? ?Provider:   ?Orbie Pyo, MD   ? ? ?Important Information About Sugar ? ? ? ? ?  ?

## 2021-10-04 LAB — LIPOPROTEIN A (LPA): Lipoprotein (a): 34.7 nmol/L (ref <75.0)

## 2021-10-30 ENCOUNTER — Encounter (INDEPENDENT_AMBULATORY_CARE_PROVIDER_SITE_OTHER): Payer: Medicare HMO | Admitting: Ophthalmology

## 2021-10-30 DIAGNOSIS — H35033 Hypertensive retinopathy, bilateral: Secondary | ICD-10-CM

## 2021-10-30 DIAGNOSIS — E113592 Type 2 diabetes mellitus with proliferative diabetic retinopathy without macular edema, left eye: Secondary | ICD-10-CM | POA: Diagnosis not present

## 2021-10-30 DIAGNOSIS — E113511 Type 2 diabetes mellitus with proliferative diabetic retinopathy with macular edema, right eye: Secondary | ICD-10-CM | POA: Diagnosis not present

## 2021-10-30 DIAGNOSIS — I10 Essential (primary) hypertension: Secondary | ICD-10-CM

## 2021-10-30 DIAGNOSIS — H43813 Vitreous degeneration, bilateral: Secondary | ICD-10-CM | POA: Diagnosis not present

## 2021-12-04 ENCOUNTER — Encounter (INDEPENDENT_AMBULATORY_CARE_PROVIDER_SITE_OTHER): Payer: Medicare HMO | Admitting: Ophthalmology

## 2021-12-04 DIAGNOSIS — E113511 Type 2 diabetes mellitus with proliferative diabetic retinopathy with macular edema, right eye: Secondary | ICD-10-CM | POA: Diagnosis not present

## 2021-12-04 DIAGNOSIS — I10 Essential (primary) hypertension: Secondary | ICD-10-CM | POA: Diagnosis not present

## 2021-12-04 DIAGNOSIS — H43813 Vitreous degeneration, bilateral: Secondary | ICD-10-CM

## 2021-12-04 DIAGNOSIS — H35033 Hypertensive retinopathy, bilateral: Secondary | ICD-10-CM

## 2021-12-04 DIAGNOSIS — E113592 Type 2 diabetes mellitus with proliferative diabetic retinopathy without macular edema, left eye: Secondary | ICD-10-CM

## 2021-12-22 NOTE — Progress Notes (Signed)
Patient ID: Robert Ramirez, male    DOB: June 26, 1945  MRN: [id number]  CC: Chronic Care Management   Subjective: Robert Ramirez is a 76 y.o. male who presents for chronic care management.   His concerns today include:  Doing well on current Metformin, Insulin NPH Human, Jardiance, and Levothyroxine. No issues/concerns. Checking blood sugars at home infrequently. Eating more cakes and pies lately.   Patient Active Problem List   Diagnosis Date Noted   Poorly-controlled hypertension 04/05/2019   Conductive hearing loss 04/05/2019   Chewing tobacco use 04/05/2019   Bilateral impacted cerumen 04/05/2019   Primary hyperparathyroidism (HCC) 11/21/2018   Chronic respiratory failure with hypoxia and hypercapnia (HCC) 11/18/2018   Chronic obstructive asthma (with obstructive pulmonary disease) (HCC) 06/09/2018   Other secondary pulmonary hypertension (HCC) 06/09/2018   Healthcare maintenance 03/30/2018   Serum total bilirubin elevated 03/26/2018   Asbestos exposure 03/26/2018   Lesion of skin of scalp 03/26/2018   Coronary artery disease involving native coronary artery of native heart without angina pectoris 03/26/2018   Acquired hypothyroidism 03/26/2018   Pleural plaque 03/14/2018   Diabetes (HCC) 03/14/2018   Somnolence    Hyponatremia    Abnormal chest x-ray    Hypertension 07/18/2014   Mixed hyperlipidemia 07/18/2014   Diabetic retinopathy associated with type 2 diabetes mellitus (HCC) 07/18/2014     Current Outpatient Medications on File Prior to Visit  Medication Sig Dispense Refill   amLODipine (NORVASC) 10 MG tablet Take 1 tablet (10 mg total) by mouth daily. 90 tablet 0   aspirin EC 81 MG EC tablet Take 1 tablet (81 mg total) by mouth daily. 90 tablet 0   atorvastatin (LIPITOR) 40 MG tablet Take 1 tablet (40 mg total) by mouth daily at 6 PM. 90 tablet 3   chlorthalidone (HYGROTON) 25 MG tablet Take 1 tablet (25 mg total) by mouth daily. 90 tablet 3   Cholecalciferol  (VITAMIN D-3) 25 MCG (1000 UT) CAPS Take by mouth.     Cyanocobalamin 2500 MCG TABS Chew by mouth daily.     DROPLET PEN NEEDLES 31G X 8 MM MISC USE AS DIRECTED 200 each 1   levothyroxine (SYNTHROID) 50 MCG tablet Take 1 tablet (50 mcg total) by mouth daily. 60 tablet 0   lisinopril (ZESTRIL) 40 MG tablet Take 40 mg by mouth once a day. 90 tablet 3   nitroGLYCERIN (NITROSTAT) 0.4 MG SL tablet Place 1 tablet (0.4 mg total) under the tongue every 5 (five) minutes as needed for chest pain. 25 tablet 3   spironolactone (ALDACTONE) 25 MG tablet Take 0.5 tablets (12.5 mg total) by mouth daily. 45 tablet 3   No current facility-administered medications on file prior to visit.    No Known Allergies  Social History   Socioeconomic History   Marital status: Married    Spouse name: Not on file   Number of children: Not on file   Years of education: Not on file   Highest education level: Not on file  Occupational History   Not on file  Tobacco Use   Smoking status: Former    Packs/day: 0.50    Years: 37.00    Total pack years: 18.50    Types: Cigarettes    Quit date: 03/30/1980    Years since quitting: 41.7    Passive exposure: Never   Smokeless tobacco: Current    Types: Chew, Snuff  Vaping Use   Vaping Use: Never used  Substance and Sexual Activity  Alcohol use: No   Drug use: No   Sexual activity: Not Currently  Other Topics Concern   Not on file  Social History Narrative   Not on file   Social Determinants of Health   Financial Resource Strain: Not on file  Food Insecurity: Not on file  Transportation Needs: Not on file  Physical Activity: Not on file  Stress: Not on file  Social Connections: Not on file  Intimate Partner Violence: Not on file    Family History  Problem Relation Age of Onset   Heart failure Mother    Cancer Father    Diabetes Sister    Cancer Sister     Past Surgical History:  Procedure Laterality Date   APPENDECTOMY     RIGHT/LEFT HEART  CATH AND CORONARY ANGIOGRAPHY N/A 03/17/2018   Procedure: RIGHT/LEFT HEART CATH AND CORONARY ANGIOGRAPHY;  Surgeon: Orpah Cobb, MD;  Location: [facility];  Service: Cardiovascular;  Laterality: N/A;    ROS: Review of Systems Negative except as stated above  PHYSICAL EXAM: BP 103/63 (BP Location: Left Arm, Patient Position: Sitting, Cuff Size: Normal)   Pulse 71   Temp 98.3 F (36.8 C)   Resp 16   Ht 6' 0.99" (1.854 m)   Wt 186 lb (84.4 kg)   SpO2 96%   BMI 24.55 kg/m   Physical Exam HENT:     Head: Normocephalic and atraumatic.  Eyes:     Extraocular Movements: Extraocular movements intact.     Conjunctiva/sclera: Conjunctivae normal.     Pupils: Pupils are equal, round, and reactive to light.  Cardiovascular:     Rate and Rhythm: Normal rate and regular rhythm.     Pulses: Normal pulses.     Heart sounds: Normal heart sounds.  Pulmonary:     Effort: Pulmonary effort is normal.     Breath sounds: Normal breath sounds.  Musculoskeletal:     Cervical back: Normal range of motion and neck supple.  Neurological:     General: No focal deficit present.     Mental Status: He is alert and oriented to person, place, and time.  Psychiatric:        Mood and Affect: Mood normal.        Behavior: Behavior normal.    Results for orders placed or performed in visit on 12/30/21  POCT glycosylated hemoglobin (Hb A1C)  Result Value Ref Range   Hemoglobin A1C 8.8 (A) 4.0 - 5.6 %   HbA1c POC (<> result, manual entry)     HbA1c, POC (prediabetic range)     HbA1c, POC (controlled diabetic range)       ASSESSMENT AND PLAN: 1. Type 2 diabetes mellitus with retinopathy, with long-term current use of insulin, macular edema presence unspecified, unspecified laterality, unspecified retinopathy severity (HCC) - Hemoglobin A1c not at goal 8.8%, goal < 8%. This is increased from previous 8.0%. - Continue Metformin and Insulin NPH Human as prescribed.  - Increase Empagliflozin  from 10 mg daily to 25 mg daily.  - Discussed the importance of healthy eating habits, low-carbohydrate diet, low-sugar diet, regular aerobic exercise (at least 150 minutes a week as tolerated) and medication compliance to achieve or maintain control of diabetes. - Update BMP.  - Follow-up with primary provider in 4 weeks or sooner if needed.  - POCT glycosylated hemoglobin (Hb A1C) - empagliflozin (JARDIANCE) 25 MG TABS tablet; Take 1 tablet (25 mg total) by mouth daily.  Dispense: 30 tablet; Refill: 2 -  Basic Metabolic Panel - metFORMIN (GLUCOPHAGE) 1000 MG tablet; Take 1 tablet (1,000 mg total) by mouth 2 (two) times daily with a meal.  Dispense: 60 tablet; Refill: 2 - insulin NPH Human (NOVOLIN N) 100 UNIT/ML injection; Inject 0.33 mLs (33 Units total) into the skin 2 (two) times daily before a meal.  Dispense: 10 mL; Refill: 5  2. Hypothyroidism, unspecified type - Update TSH. Will update medication regimen once lab results. - TSH   Patient was given the opportunity to ask questions.  Patient verbalized understanding of the plan and was able to repeat key elements of the plan. Patient was given clear instructions to go to Emergency Department or return to medical center if symptoms don't improve, worsen, or new problems develop.The patient verbalized understanding.   Orders Placed This Encounter  Procedures   TSH   Basic Metabolic Panel   POCT glycosylated hemoglobin (Hb A1C)     Requested Prescriptions   Signed Prescriptions Disp Refills   empagliflozin (JARDIANCE) 25 MG TABS tablet 30 tablet 2    Sig: Take 1 tablet (25 mg total) by mouth daily.   metFORMIN (GLUCOPHAGE) 1000 MG tablet 60 tablet 2    Sig: Take 1 tablet (1,000 mg total) by mouth 2 (two) times daily with a meal.   insulin NPH Human (NOVOLIN N) 100 UNIT/ML injection 10 mL 5    Sig: Inject 0.33 mLs (33 Units total) into the skin 2 (two) times daily before a meal.    Return in about 4 weeks (around 01/27/2022) for  Follow-Up or next available DM .  Rema Fendt, NP

## 2021-12-28 ENCOUNTER — Other Ambulatory Visit: Payer: Self-pay | Admitting: Family

## 2021-12-28 DIAGNOSIS — E11319 Type 2 diabetes mellitus with unspecified diabetic retinopathy without macular edema: Secondary | ICD-10-CM

## 2021-12-29 NOTE — Telephone Encounter (Signed)
Order complete. 

## 2021-12-30 ENCOUNTER — Encounter: Payer: Self-pay | Admitting: Family

## 2021-12-30 ENCOUNTER — Ambulatory Visit (INDEPENDENT_AMBULATORY_CARE_PROVIDER_SITE_OTHER): Payer: Medicare HMO | Admitting: Family

## 2021-12-30 VITALS — BP 103/63 | HR 71 | Temp 98.3°F | Resp 16 | Ht 72.99 in | Wt 186.0 lb

## 2021-12-30 DIAGNOSIS — E039 Hypothyroidism, unspecified: Secondary | ICD-10-CM

## 2021-12-30 DIAGNOSIS — E11319 Type 2 diabetes mellitus with unspecified diabetic retinopathy without macular edema: Secondary | ICD-10-CM | POA: Diagnosis not present

## 2021-12-30 DIAGNOSIS — Z794 Long term (current) use of insulin: Secondary | ICD-10-CM | POA: Diagnosis not present

## 2021-12-30 LAB — POCT GLYCOSYLATED HEMOGLOBIN (HGB A1C): Hemoglobin A1C: 8.8 % — AB (ref 4.0–5.6)

## 2021-12-30 MED ORDER — INSULIN NPH (HUMAN) (ISOPHANE) 100 UNIT/ML ~~LOC~~ SUSP: 33.0000 [IU] | Freq: Two times a day (BID) | SUBCUTANEOUS | 5 refills | Status: DC

## 2021-12-30 MED ORDER — EMPAGLIFLOZIN 25 MG PO TABS: 25.0000 mg | ORAL_TABLET | Freq: Every day | ORAL | 2 refills | Status: AC

## 2021-12-30 MED ORDER — METFORMIN HCL 1000 MG PO TABS
1000.0000 mg | ORAL_TABLET | Freq: Two times a day (BID) | ORAL | 2 refills | Status: DC
Start: 2021-12-30 — End: 2022-03-11

## 2021-12-30 NOTE — Progress Notes (Signed)
.  Pt presents for chronic care management   

## 2021-12-31 ENCOUNTER — Other Ambulatory Visit: Payer: Self-pay | Admitting: Family

## 2021-12-31 DIAGNOSIS — R7989 Other specified abnormal findings of blood chemistry: Secondary | ICD-10-CM

## 2021-12-31 DIAGNOSIS — R944 Abnormal results of kidney function studies: Secondary | ICD-10-CM

## 2021-12-31 LAB — TSH: TSH: 2.96 u[IU]/mL (ref 0.450–4.500)

## 2021-12-31 LAB — BASIC METABOLIC PANEL
BUN/Creatinine Ratio: 22 (ref 10–24)
BUN: 29 mg/dL — ABNORMAL HIGH (ref 8–27)
CO2: 24 mmol/L (ref 20–29)
Calcium: 10.9 mg/dL — ABNORMAL HIGH (ref 8.6–10.2)
Chloride: 100 mmol/L (ref 96–106)
Creatinine, Ser: 1.34 mg/dL — ABNORMAL HIGH (ref 0.76–1.27)
Glucose: 182 mg/dL — ABNORMAL HIGH (ref 70–99)
Potassium: 5.1 mmol/L (ref 3.5–5.2)
Sodium: 141 mmol/L (ref 134–144)
eGFR: 56 mL/min/{1.73_m2} — ABNORMAL LOW (ref >59)

## 2022-01-08 ENCOUNTER — Encounter (INDEPENDENT_AMBULATORY_CARE_PROVIDER_SITE_OTHER): Payer: Medicare HMO | Admitting: Ophthalmology

## 2022-01-08 ENCOUNTER — Ambulatory Visit: Payer: Medicare HMO | Admitting: Family

## 2022-01-08 DIAGNOSIS — E113592 Type 2 diabetes mellitus with proliferative diabetic retinopathy without macular edema, left eye: Secondary | ICD-10-CM

## 2022-01-08 DIAGNOSIS — E113511 Type 2 diabetes mellitus with proliferative diabetic retinopathy with macular edema, right eye: Secondary | ICD-10-CM

## 2022-01-08 DIAGNOSIS — H43813 Vitreous degeneration, bilateral: Secondary | ICD-10-CM

## 2022-01-08 DIAGNOSIS — I10 Essential (primary) hypertension: Secondary | ICD-10-CM | POA: Diagnosis not present

## 2022-01-08 DIAGNOSIS — H35033 Hypertensive retinopathy, bilateral: Secondary | ICD-10-CM | POA: Diagnosis not present

## 2022-01-13 ENCOUNTER — Ambulatory Visit: Payer: Medicare HMO | Admitting: Family

## 2022-01-17 ENCOUNTER — Encounter (INDEPENDENT_AMBULATORY_CARE_PROVIDER_SITE_OTHER): Payer: Medicare HMO | Admitting: Ophthalmology

## 2022-01-17 DIAGNOSIS — H26491 Other secondary cataract, right eye: Secondary | ICD-10-CM | POA: Diagnosis not present

## 2022-01-20 NOTE — Progress Notes (Signed)
Patient ID: Robert Ramirez, male    DOB: 02-Jan-1946  MRN: [id number]  CC: Chronic Care Management   Subjective: Robert Ramirez is a 76 y.o. male who presents for chronic care management.   His concerns today include:  Doing well on diabetes medications without issues or concerns. He is not checking blood sugars at home. He is established with Cardiology for all heart-related care and plans to call their office soon for blood pressure medication refills.   Patient Active Problem List   Diagnosis Date Noted   Poorly-controlled hypertension 04/05/2019   Conductive hearing loss 04/05/2019   Chewing tobacco use 04/05/2019   Bilateral impacted cerumen 04/05/2019   Primary hyperparathyroidism (HCC) 11/21/2018   Chronic respiratory failure with hypoxia and hypercapnia (HCC) 11/18/2018   Chronic obstructive asthma (with obstructive pulmonary disease) (HCC) 06/09/2018   Other secondary pulmonary hypertension (HCC) 06/09/2018   Healthcare maintenance 03/30/2018   Serum total bilirubin elevated 03/26/2018   Asbestos exposure 03/26/2018   Lesion of skin of scalp 03/26/2018   Coronary artery disease involving native coronary artery of native heart without angina pectoris 03/26/2018   Acquired hypothyroidism 03/26/2018   Pleural plaque 03/14/2018   Diabetes (HCC) 03/14/2018   Somnolence    Hyponatremia    Abnormal chest x-ray    Hypertension 07/18/2014   Mixed hyperlipidemia 07/18/2014   Diabetic retinopathy associated with type 2 diabetes mellitus (HCC) 07/18/2014     Current Outpatient Medications on File Prior to Visit  Medication Sig Dispense Refill   aspirin EC 81 MG EC tablet Take 1 tablet (81 mg total) by mouth daily. 90 tablet 0   atorvastatin (LIPITOR) 40 MG tablet Take 1 tablet (40 mg total) by mouth daily at 6 PM. 90 tablet 3   Cholecalciferol (VITAMIN D-3) 25 MCG (1000 UT) CAPS Take by mouth.     Cyanocobalamin 2500 MCG TABS Chew by mouth daily.     empagliflozin  (JARDIANCE) 25 MG TABS tablet Take 1 tablet (25 mg total) by mouth daily. 30 tablet 2   insulin NPH Human (NOVOLIN N) 100 UNIT/ML injection Inject 0.33 mLs (33 Units total) into the skin 2 (two) times daily before a meal. 10 mL 5   lisinopril (ZESTRIL) 40 MG tablet Take 40 mg by mouth once a day. 90 tablet 3   metFORMIN (GLUCOPHAGE) 1000 MG tablet Take 1 tablet (1,000 mg total) by mouth 2 (two) times daily with a meal. 60 tablet 2   nitroGLYCERIN (NITROSTAT) 0.4 MG SL tablet Place 1 tablet (0.4 mg total) under the tongue every 5 (five) minutes as needed for chest pain. 25 tablet 3   spironolactone (ALDACTONE) 25 MG tablet Take 0.5 tablets (12.5 mg total) by mouth daily. 45 tablet 3   amLODipine (NORVASC) 10 MG tablet Take 1 tablet (10 mg total) by mouth daily. 90 tablet 0   chlorthalidone (HYGROTON) 25 MG tablet Take 1 tablet (25 mg total) by mouth daily. (Patient not taking: Reported on 01/28/2022) 90 tablet 3   DROPLET PEN NEEDLES 31G X 8 MM MISC USE AS DIRECTED 200 each 1   No current facility-administered medications on file prior to visit.    No Known Allergies  Social History   Socioeconomic History   Marital status: Married    Spouse name: Not on file   Number of children: Not on file   Years of education: Not on file   Highest education level: Not on file  Occupational History   Not on file  Tobacco  Use   Smoking status: Former    Packs/day: 0.50    Years: 37.00    Total pack years: 18.50    Types: Cigarettes    Quit date: 03/30/1980    Years since quitting: 41.8    Passive exposure: Never   Smokeless tobacco: Current    Types: Chew, Snuff  Vaping Use   Vaping Use: Never used  Substance and Sexual Activity   Alcohol use: No   Drug use: No   Sexual activity: Not Currently  Other Topics Concern   Not on file  Social History Narrative   Not on file   Social Determinants of Health   Financial Resource Strain: Not on file  Food Insecurity: Not on file   Transportation Needs: Not on file  Physical Activity: Not on file  Stress: Not on file  Social Connections: Not on file  Intimate Partner Violence: Not on file    Family History  Problem Relation Age of Onset   Heart failure Mother    Cancer Father    Diabetes Sister    Cancer Sister     Past Surgical History:  Procedure Laterality Date   APPENDECTOMY     RIGHT/LEFT HEART CATH AND CORONARY ANGIOGRAPHY N/A 03/17/2018   Procedure: RIGHT/LEFT HEART CATH AND CORONARY ANGIOGRAPHY;  Surgeon: Orpah Cobb, MD;  Location: [facility];  Service: Cardiovascular;  Laterality: N/A;    ROS: Review of Systems Negative except as stated above  PHYSICAL EXAM: BP 130/82 (BP Location: Left Arm, Patient Position: Sitting, Cuff Size: Normal)   Pulse 78   Temp 98.2 F (36.8 C) (Oral)   Resp 16   Ht 6\' 1"  (1.854 m)   Wt 195 lb (88.5 kg)   SpO2 95%   BMI 25.73 kg/m   Physical Exam HENT:     Head: Normocephalic and atraumatic.  Eyes:     Extraocular Movements: Extraocular movements intact.     Conjunctiva/sclera: Conjunctivae normal.     Pupils: Pupils are equal, round, and reactive to light.  Cardiovascular:     Rate and Rhythm: Normal rate and regular rhythm.     Pulses: Normal pulses.     Heart sounds: Normal heart sounds.  Pulmonary:     Effort: Pulmonary effort is normal.     Breath sounds: Normal breath sounds.  Musculoskeletal:     Cervical back: Normal range of motion and neck supple.  Neurological:     General: No focal deficit present.     Mental Status: He is alert and oriented to person, place, and time.  Psychiatric:        Mood and Affect: Mood normal.        Behavior: Behavior normal.     ASSESSMENT AND PLAN: 1. Type 2 diabetes mellitus with retinopathy, with long-term current use of insulin, macular edema presence unspecified, unspecified laterality, unspecified retinopathy severity (HCC) - Continue Metformin, Insulin NPH Human, and  Empagliflozin as  prescribed. No refills needed as of present.  - Update hemoglobin A1c.  - Follow-up with primary provider as scheduled.  - Hemoglobin A1c  2. Hypothyroidism, unspecified type - Recent TSH  on 12/30/2021 normal.  - Continue Levothyroxine as prescribed.  - Follow-up with primary provider in 3 months or sooner if needed.  - levothyroxine (SYNTHROID) 50 MCG tablet; Take 1 tablet (50 mcg total) by mouth daily.  Dispense: 30 tablet; Refill: 2   Patient was given the opportunity to ask questions.  Patient verbalized understanding of the  plan and was able to repeat key elements of the plan. Patient was given clear instructions to go to Emergency Department or return to medical center if symptoms don't improve, worsen, or new problems develop.The patient verbalized understanding.   Orders Placed This Encounter  Procedures   Hemoglobin A1c    Follow-up with primary care as scheduled.   Rema Fendt, NP

## 2022-01-28 ENCOUNTER — Encounter: Payer: Self-pay | Admitting: Family

## 2022-01-28 ENCOUNTER — Ambulatory Visit (INDEPENDENT_AMBULATORY_CARE_PROVIDER_SITE_OTHER): Payer: Medicare HMO | Admitting: Family

## 2022-01-28 VITALS — BP 130/82 | HR 78 | Temp 98.2°F | Resp 16 | Ht 73.0 in | Wt 195.0 lb

## 2022-01-28 DIAGNOSIS — Z794 Long term (current) use of insulin: Secondary | ICD-10-CM

## 2022-01-28 DIAGNOSIS — E039 Hypothyroidism, unspecified: Secondary | ICD-10-CM

## 2022-01-28 DIAGNOSIS — E11319 Type 2 diabetes mellitus with unspecified diabetic retinopathy without macular edema: Secondary | ICD-10-CM

## 2022-01-28 MED ORDER — LEVOTHYROXINE SODIUM 50 MCG PO TABS: 50.0000 ug | ORAL_TABLET | Freq: Every day | ORAL | 2 refills | Status: DC

## 2022-01-28 NOTE — Progress Notes (Signed)
Needs refill on chlorthalidone Unsure which medication he is out of today. He isn't sure if he out of amlodipine or chlorthalidone.

## 2022-01-28 NOTE — Patient Instructions (Signed)
Diabetes Mellitus and Skin Care Diabetes, also called diabetes mellitus, can lead to skin problems. People with diabetes have a higher risk for many types of skin complications because poorly controlled blood sugar (glucose) levels can cause problems over time. These problems include: Damage to nerves. This can affect your ability to feel wounds, which means you may not notice minor skin injuries that could lead to serious problems. This can also decrease the amount that you sweat, causing dry skin that can break down. Damage to blood vessels. The lack of blood flow can cause skin to break down and can slow healing time, which can lead to infections. Areas of skin that become thick or discolored. Common skin conditions There are certain skin conditions that commonly affect people who have diabetes, such as: Dry skin. Thin skin. The skin on the feet may get thinner, break more easily, and heal more slowly than normal. Bacterial skin infections, including: Styes. Boils. Infected hair follicles. Infections of the skin around the nails. Fungal skin infections. These are most common in areas where skin rubs together, such as in the armpits or under the breasts. Common skin changes Diabetes can also cause the skin to change. You may develop: Dark, velvety markings on the skin that usually appear on the face, neck, armpits, inner thighs, and groin. Red, raised, scar-like tissue that may itch, feel painful, or develop into a wound. Blisters on the feet, toes, hands, or fingers. Thickened, wax-like areas of skin that usually occur on the hands, forehead, or toes. Brown or red, ring-shaped or half-ring-shaped patches of skin on the ears or fingers. Pea-shaped, yellow bumps that may be itchy and surrounded by a red ring. This usually affects the arms, feet, buttocks, and the top of the hands. Round, discolored patches of tan skin that do not hurt or itch. These may look like age spots. General  tips Most skin problems can be prevented or treated easily if caught early. Talk with your health care provider if you have any concerns. General tips usually include: Check your skin every day for cuts, bruises, redness, blisters, or sores, especially on your feet. Tell your health care provider about any cuts, wounds, or sores that you have and if they are healing slowly. Keep your skin clean and dry. Do not use hot water. Moisturize your skin to prevent chapping. Do not scratch dry skin. Scratching dry skin can expose it to infection. Keep your blood glucose levels within target range. Supplies needed: Mild soap or gentle skin cleanser. Lotion. How to care for dry itchy skin It is common for people with diabetes to have itchy skin caused by dryness. Frequent high glucose levels can cause itchiness, and poor circulation and certain skin infections can make dry, itchy skin worse. If you have itchy skin that is red or covered in a rash, this could be a sign of an allergic reaction. If you have a rash or if your skin is very itchy, contact your health care provider. You may need help to manage your diabetes better, or you may need treatment for an infection. Untreated fungal infections can be dangerous because they allow more serious bacterial infections to enter the body. To relieve dry skin and itching: Avoid very hot showers and baths. Use mild soap and gentle skin cleansers. Do not use soap that is perfumed or harsh or that dries your skin. Moisturizing soaps may help. Put on moisturizing lotion as soon as you finish bathing. Follow these instructions at home:    Schedule a foot exam with your health care provider once every year. This exam includes an inspection of the structure and skin of your feet. Make sure that your health care provider performs a visual foot exam at every medical visit. If you get a skin injury, such as a cut, blister, or sore, check the area every day for signs of  infection. Check for: Redness, swelling, or pain. Fluid or blood. Warmth. Pus or a bad smell. Do not use any products that contain nicotine or tobacco. These products include cigarettes, chewing tobacco, and vaping devices, such as e-cigarettes. If you need help quitting, ask your health care provider. Take over-the-counter and prescription medicines only as told by your health care provider. This includes all diabetes medicines you are taking. Where to find more information American Diabetes Association: www.diabetes.org Association of Diabetes Care & Education Specialists: www.diabeteseducator.org Contact a health care provider if you: Develop a cut or sore, especially on your feet. Develop signs of infection after a skin injury. Have itchy skin that develops redness or a rash. Have discolored areas of skin. Have areas where your skin is changing, such as thickening or appearing shiny. Summary People with diabetes have a higher risk for many types of skin complications. This is because poorly controlled blood sugar (glucose) levels can cause skin problems over time. Most skin problems can be prevented or treated easily if caught early. Keep your blood glucose levels within target range. Check your skin every day for cuts, bruises, redness, blisters, or sores, especially on your feet. Tell your health care provider about any cuts, wounds, or sores that you have and if they are healing slowly. This information is not intended to replace advice given to you by your health care provider. Make sure you discuss any questions you have with your health care provider. Document Revised: 09/18/2021 Document Reviewed: 12/01/2019 Elsevier Patient Education  2023 Elsevier Inc.  

## 2022-01-29 LAB — HEMOGLOBIN A1C
Est. average glucose Bld gHb Est-mCnc: 189 mg/dL
Hgb A1c MFr Bld: 8.2 % — ABNORMAL HIGH (ref 4.8–5.6)

## 2022-02-07 ENCOUNTER — Telehealth: Payer: Self-pay | Admitting: Family

## 2022-02-07 NOTE — Telephone Encounter (Signed)
Test strips are not on current list, please advise.

## 2022-02-07 NOTE — Telephone Encounter (Signed)
Medication Refill - Medication: test strips for ccu check guide  Has the patient contacted their pharmacy? No. Wife states it has been a long time since pt had any.  Wife states they both have not been checking like they should, have not had any test strips.  Preferred Pharmacy (with phone number or street name): [facility] - [place], [place] - [address]  Has the patient been seen for an appointment in the last year OR does the patient have an upcoming appointment? Yes.    Agent: Please be advised that RX refills may take up to 3 business days. We ask that you follow-up with your pharmacy.

## 2022-02-08 ENCOUNTER — Other Ambulatory Visit: Payer: Self-pay

## 2022-02-08 DIAGNOSIS — E11319 Type 2 diabetes mellitus with unspecified diabetic retinopathy without macular edema: Secondary | ICD-10-CM

## 2022-02-08 MED ORDER — BLOOD GLUCOSE METER KIT: PACK | 0 refills | Status: AC

## 2022-02-08 NOTE — Telephone Encounter (Signed)
Blood glucose monitor kit ordered for pt sent to mail order pharmacy

## 2022-02-08 NOTE — Progress Notes (Signed)
Blood glucose kit ordered for pt and sent to mail order pharmacy

## 2022-02-19 ENCOUNTER — Encounter (INDEPENDENT_AMBULATORY_CARE_PROVIDER_SITE_OTHER): Payer: Medicare HMO | Admitting: Ophthalmology

## 2022-02-19 DIAGNOSIS — E113592 Type 2 diabetes mellitus with proliferative diabetic retinopathy without macular edema, left eye: Secondary | ICD-10-CM

## 2022-02-19 DIAGNOSIS — H35371 Puckering of macula, right eye: Secondary | ICD-10-CM | POA: Diagnosis not present

## 2022-02-19 DIAGNOSIS — H43813 Vitreous degeneration, bilateral: Secondary | ICD-10-CM

## 2022-02-19 DIAGNOSIS — H35033 Hypertensive retinopathy, bilateral: Secondary | ICD-10-CM

## 2022-02-19 DIAGNOSIS — I10 Essential (primary) hypertension: Secondary | ICD-10-CM | POA: Diagnosis not present

## 2022-02-19 DIAGNOSIS — E113511 Type 2 diabetes mellitus with proliferative diabetic retinopathy with macular edema, right eye: Secondary | ICD-10-CM | POA: Diagnosis not present

## 2022-03-10 ENCOUNTER — Other Ambulatory Visit: Payer: Self-pay | Admitting: Internal Medicine

## 2022-03-10 ENCOUNTER — Other Ambulatory Visit: Payer: Self-pay | Admitting: Family

## 2022-03-10 DIAGNOSIS — I10 Essential (primary) hypertension: Secondary | ICD-10-CM

## 2022-03-10 DIAGNOSIS — E039 Hypothyroidism, unspecified: Secondary | ICD-10-CM

## 2022-03-10 DIAGNOSIS — E11319 Type 2 diabetes mellitus with unspecified diabetic retinopathy without macular edema: Secondary | ICD-10-CM

## 2022-03-10 DIAGNOSIS — E785 Hyperlipidemia, unspecified: Secondary | ICD-10-CM

## 2022-03-14 ENCOUNTER — Other Ambulatory Visit: Payer: Self-pay | Admitting: Internal Medicine

## 2022-04-02 ENCOUNTER — Encounter (INDEPENDENT_AMBULATORY_CARE_PROVIDER_SITE_OTHER): Payer: Medicare HMO | Admitting: Ophthalmology

## 2022-04-02 DIAGNOSIS — E113592 Type 2 diabetes mellitus with proliferative diabetic retinopathy without macular edema, left eye: Secondary | ICD-10-CM | POA: Diagnosis not present

## 2022-04-02 DIAGNOSIS — H43813 Vitreous degeneration, bilateral: Secondary | ICD-10-CM

## 2022-04-02 DIAGNOSIS — H35033 Hypertensive retinopathy, bilateral: Secondary | ICD-10-CM

## 2022-04-02 DIAGNOSIS — E113511 Type 2 diabetes mellitus with proliferative diabetic retinopathy with macular edema, right eye: Secondary | ICD-10-CM

## 2022-04-02 DIAGNOSIS — I10 Essential (primary) hypertension: Secondary | ICD-10-CM

## 2022-04-03 ENCOUNTER — Other Ambulatory Visit: Payer: Self-pay | Admitting: Family

## 2022-04-03 DIAGNOSIS — E11319 Type 2 diabetes mellitus with unspecified diabetic retinopathy without macular edema: Secondary | ICD-10-CM

## 2022-04-03 NOTE — Telephone Encounter (Signed)
Requested Prescriptions  Pending Prescriptions Disp Refills   NOVOLIN N FLEXPEN 100 UNIT/ML FlexPen [Pharmacy Med Name: NOVOLIN N FLEXPEN 100 UNIT/ML Suspension Pen-injector] 60 mL 6    Sig: INJECT 33 UNITS UNDER THE SKIN TWICE DAILY BEFORE MEALS     Endocrinology:  Diabetes - Insulins Failed - 04/03/2022  1:38 PM      Failed - HBA1C is between 0 and 7.9 and within 180 days    HbA1c, POC (controlled diabetic range)  Date Value Ref Range Status  03/15/2021 8.3 (A) 0.0 - 7.0 % Final   Hgb A1c MFr Bld  Date Value Ref Range Status  01/28/2022 8.2 (H) 4.8 - 5.6 % Final    Comment:             Prediabetes: 5.7 - 6.4          Diabetes: >6.4          Glycemic control for adults with diabetes: <7.0          Passed - Valid encounter within last 6 months    Recent Outpatient Visits           2 months ago Type 2 diabetes mellitus with retinopathy, with long-term current use of insulin, macular edema presence unspecified, unspecified laterality, unspecified retinopathy severity (HCC)   Primary Care at [facility], Amy J, NP   3 months ago Type 2 diabetes mellitus with retinopathy, with long-term current use of insulin, macular edema presence unspecified, unspecified laterality, unspecified retinopathy severity (HCC)   Primary Care at [facility], Amy J, NP   6 months ago Type 2 diabetes mellitus with retinopathy, with long-term current use of insulin, macular edema presence unspecified, unspecified laterality, unspecified retinopathy severity [facility])   Primary Care at [facility], Amy J, NP   9 months ago Type 2 diabetes mellitus with retinopathy, with long-term current use of insulin, macular edema presence unspecified, unspecified laterality, unspecified retinopathy severity [facility])   Primary Care at [facility], Amy J, NP   1 year ago Type 2 diabetes mellitus with retinopathy, with long-term current use of insulin, macular edema presence unspecified,  unspecified laterality, unspecified retinopathy severity [facility])   Primary Care at [facility], Salomon Fick, NP       Future Appointments             In 2 weeks Orbie Pyo, MD [facility] [place]   In 3 weeks Rema Fendt, NP Primary Care at [facility]

## 2022-04-21 ENCOUNTER — Encounter: Payer: Self-pay | Admitting: Internal Medicine

## 2022-04-21 ENCOUNTER — Ambulatory Visit: Payer: Medicare HMO | Attending: Internal Medicine | Admitting: Internal Medicine

## 2022-04-21 VITALS — BP 124/58 | HR 71 | Ht 73.0 in | Wt 190.0 lb

## 2022-04-21 DIAGNOSIS — E11319 Type 2 diabetes mellitus with unspecified diabetic retinopathy without macular edema: Secondary | ICD-10-CM | POA: Diagnosis not present

## 2022-04-21 DIAGNOSIS — I152 Hypertension secondary to endocrine disorders: Secondary | ICD-10-CM | POA: Diagnosis not present

## 2022-04-21 DIAGNOSIS — E785 Hyperlipidemia, unspecified: Secondary | ICD-10-CM

## 2022-04-21 DIAGNOSIS — E1169 Type 2 diabetes mellitus with other specified complication: Secondary | ICD-10-CM | POA: Diagnosis not present

## 2022-04-21 DIAGNOSIS — E1159 Type 2 diabetes mellitus with other circulatory complications: Secondary | ICD-10-CM | POA: Diagnosis not present

## 2022-04-21 DIAGNOSIS — I700 Atherosclerosis of aorta: Secondary | ICD-10-CM | POA: Diagnosis not present

## 2022-04-21 DIAGNOSIS — I2510 Atherosclerotic heart disease of native coronary artery without angina pectoris: Secondary | ICD-10-CM | POA: Diagnosis not present

## 2022-04-21 DIAGNOSIS — I452 Bifascicular block: Secondary | ICD-10-CM | POA: Diagnosis not present

## 2022-04-21 DIAGNOSIS — Z794 Long term (current) use of insulin: Secondary | ICD-10-CM

## 2022-04-21 MED ORDER — EMPAGLIFLOZIN 10 MG PO TABS: 10.0000 mg | ORAL_TABLET | Freq: Every day | ORAL | 3 refills | Status: DC

## 2022-04-21 NOTE — Patient Instructions (Signed)
Medication Instructions:  Your physician has recommended you make the following change in your medication:  Resume jardiance 10 mg by mouth daily    *If you need a refill on your cardiac medications before your next appointment, please call your pharmacy*   Lab Work: Lab work to be done today--Lipid and liver profiles If you have labs (blood work) drawn today and your tests are completely normal, you will receive your results only by: MyChart Message (if you have MyChart) OR A paper copy in the mail If you have any lab test that is abnormal or we need to change your treatment, we will call you to review the results.   Testing/Procedures: none   Follow-Up: At [facility], you and your health needs are our priority.  As part of our continuing mission to provide you with exceptional heart care, we have created designated Provider Care Teams.  These Care Teams include your primary Cardiologist (physician) and Advanced Practice Providers (APPs -  Physician Assistants and Nurse Practitioners) who all work together to provide you with the care you need, when you need it.  We recommend signing up for the patient portal called "MyChart".  Sign up information is provided on this After Visit Summary.  MyChart is used to connect with patients for Virtual Visits (Telemedicine).  Patients are able to view lab/test results, encounter notes, upcoming appointments, etc.  Non-urgent messages can be sent to your provider as well.   To learn more about what you can do with MyChart, go to [email or web address].    Your next appointment:   9 month(s)  The format for your next appointment:   In Person  Provider:   Orbie Pyo, MD     Other Instructions    Important Information About Sugar

## 2022-04-21 NOTE — Progress Notes (Signed)
Cardiology Office Note:    Date:  04/21/2022   ID:  Robert Ramirez, DOB [birth year], MRN [id number]  PCP:  Camillia Herter, NP   [place] Providers Cardiologist:  Lenna Sciara, MD Referring MD: Camillia Herter, NP   Chief Complaint/Reason for Referral: Follow-up  ASSESSMENT:    1. Type 2 diabetes mellitus with retinopathy, with long-term current use of insulin, macular edema presence unspecified, unspecified laterality, unspecified retinopathy severity ([place])   2. Hypertension associated with diabetes ([place])   3. Coronary artery disease involving native coronary artery of native heart without angina pectoris   4. Hyperlipidemia associated with type 2 diabetes mellitus ([place])   5. Aortic atherosclerosis ([place])   6. Bifascicular block      PLAN:    In order of problems listed above: 1.  Type 2 diabetes:  Continue aspirin, atorvastatin, lisinopril, and Jardiance.  He could not afford the higher dose of Jardiance but cannot afford the low-dose.  We will restart Jardiance 10 mg daily. 2.  Hypertension: Well-controlled today 3.  Coronary artery disease:  Continue current therapy. 4.  Hyperlipidemia: Check lipid panel and LFTs today.   5.  Aortic atherosclerosis: Continue aspirin, statin, and strict blood pressure control. 6.  Bifascicular block: Monitor for now, no symptoms of presyncope or syncope.           Dispo:  Return in about 9 months (around 01/20/2023).      Medication Adjustments/Labs and Tests Ordered: Current medicines are reviewed at length with the patient today.  Concerns regarding medicines are outlined above.  The following changes have been made:  no change   Labs/tests ordered: Orders Placed This Encounter  Procedures   Lipid Profile   Hepatic function panel    Medication Changes: Meds ordered this encounter  Medications   empagliflozin (JARDIANCE) 10 MG TABS tablet    Sig: Take 1 tablet (10 mg total) by mouth daily before breakfast.     Dispense:  90 tablet    Refill:  3     Current medicines are reviewed at length with the patient today.  The patient does not have concerns regarding medicines.   History of Present Illness:    FOCUSED PROBLEM LIST:   1.  Mild coronary artery disease on cardiac catheterization 2019 with moderate right coronary artery lesion 2.  Type 2 diabetes 3.  Hypertension 4.  Hyperlipidemia; low LP(a) 5.  Obstructive sleep apnea on BiPAP 6.  Bifascicular block consisting of very long first-degree AV block and right bundle branch block 7.  Aortic atherosclerosis and coronary artery calcification seen on CT 2020 8.  OSA on BIPAP  The patient is a 76 y.o. male with the indicated medical history here for routine follow-up.  November 2022: The patient was seen for elevated blood pressure.  He was started on Bystolic.  Nitroglycerin as well as Vania Rea was also started.  He was seen by the pharmacy division and spironolactone was added.  May 2023: The patient is doing very well.  He has noticed that his spironolactone and Jardiance have resulted in less peripheral edema.  His blood pressure is well controlled today.  He does get somewhat lightheaded when he goes from sitting to standing quickly.  He does not have presyncope or syncope otherwise.  Plan: Check LP(a).  Today: The patient's LP(a) was low.  The patient was seen by his primary care provider in September and his blood pressure was fairly well controlled.  Patient's Jardiance was increased to 25  mg but he could not afford this so he stopped it entirely.  In terms of his breathing he feels very well.  He denies any peripheral edema, paroxysmal dyspnea, orthopnea, signs or symptoms of stroke, exertional angina, or severe bleeding.  He is required no emergency room visits or hospitalizations.  He feels very well.        Current Medications: Current Meds  Medication Sig   amLODipine (NORVASC) 10 MG tablet TAKE 1 TABLET (10 MG TOTAL) BY MOUTH  DAILY.   aspirin EC 81 MG EC tablet Take 1 tablet (81 mg total) by mouth daily.   atorvastatin (LIPITOR) 40 MG tablet TAKE 1 TABLET EVERY DAY AT 6 PM   blood glucose meter kit and supplies Dispense based on patient and insurance preference. Use up to four times daily as directed. (FOR ICD-10 E10.9, E11.9).   chlorthalidone (HYGROTON) 25 MG tablet TAKE 1 TABLET EVERY DAY   Cholecalciferol (VITAMIN D-3) 25 MCG (1000 UT) CAPS Take by mouth.   Cyanocobalamin 2500 MCG TABS Chew by mouth daily.   DROPLET PEN NEEDLES 31G X 8 MM MISC USE AS DIRECTED   empagliflozin (JARDIANCE) 10 MG TABS tablet Take 1 tablet (10 mg total) by mouth daily before breakfast.   Insulin NPH, Human,, Isophane, (NOVOLIN N FLEXPEN) 100 UNIT/ML Kiwkpen INJECT 33 UNITS UNDER THE SKIN TWICE DAILY BEFORE MEALS   levothyroxine (SYNTHROID) 50 MCG tablet TAKE 1 TABLET EVERY DAY   lisinopril (ZESTRIL) 40 MG tablet TAKE 1 TABLET ONE TIME DAILY   metFORMIN (GLUCOPHAGE) 1000 MG tablet TAKE 1 TABLET TWICE DAILY WITH MEALS   nitroGLYCERIN (NITROSTAT) 0.4 MG SL tablet Place 1 tablet (0.4 mg total) under the tongue every 5 (five) minutes as needed for chest pain.   spironolactone (ALDACTONE) 25 MG tablet TAKE 1 TABLET EVERY DAY     Allergies:    Patient has no known allergies.   Social History:   Social History   Tobacco Use   Smoking status: Former    Packs/day: 0.50    Years: 37.00    Total pack years: 18.50    Types: Cigarettes    Quit date: 03/30/1980    Years since quitting: 42.0    Passive exposure: Never   Smokeless tobacco: Current    Types: Chew, Snuff  Vaping Use   Vaping Use: Never used  Substance Use Topics   Alcohol use: No   Drug use: No     Family Hx: Family History  Problem Relation Age of Onset   Heart failure Mother    Cancer Father    Diabetes Sister    Cancer Sister      Review of Systems:   Please see the history of present illness.    All other systems reviewed and are negative.      EKGs/Labs/Other Test Reviewed:    EKG:  EKG performed today that I personally reviewed demonstrates first-degree AV block with right bundle branch block.  Prior CV studies:  Cardiac catheterization 07/25/2017 with mild RCA disease  TTE 2017/07/25 with ejection fraction of 60 to 65% with no significant wall motion abnormalities with D-shaped interventricular septum suggestive of RV pressure overload  Other studies Reviewed: Review of the additional studies/records demonstrates: CT 2018-07-25 with coronary artery calcification and aortic atherosclerosis  Recent Labs: 12/30/2021: BUN 29; Creatinine, Ser 1.34; Potassium 5.1; Sodium 141; TSH 2.960   Recent Lipid Panel Lab Results  Component Value Date/Time   CHOL 114 12/03/2020 03:31 PM   TRIG 1985/07/25  12/03/2020 03:31 PM   HDL 43 12/03/2020 03:31 PM   LDLCALC 54 12/03/2020 03:31 PM    Risk Assessment/Calculations:          Physical Exam:    VS:  BP (!) 124/58   Pulse 71   Ht _0  (1.854 m)   Wt 190 lb (86.2 kg)   SpO2 96%   BMI 25.07 kg/m    Wt Readings from Last 3 Encounters:  04/21/22 190 lb (86.2 kg)  01/28/22 195 lb (88.5 kg)  12/30/21 186 lb (84.4 kg)    GENERAL:  No apparent distress, AOx3 HEENT:  No carotid bruits, +2 carotid impulses, no scleral icterus CAR: RRR no murmurs, gallops, rubs, or thrills RES:  Clear to auscultation bilaterally ABD:  Soft, nontender, nondistended, positive bowel sounds x 4 VASC:  +2 radial pulses, +2 carotid pulses, palpable pedal pulses NEURO:  CN 2-12 grossly intact; motor and sensory grossly intact PSYCH:  No active depression or anxiety EXT:  No edema, ecchymosis, or cyanosis  Signed, Early Osmond, MD  04/21/2022 3:03 PM    [address], [place], [place]  [address] Phone: [phone]; Fax: [phone]   Note:  This document was prepared using Dragon voice recognition software and may include unintentional dictation errors.

## 2022-04-22 LAB — HEPATIC FUNCTION PANEL
ALT: 12 IU/L (ref 0–44)
AST: 15 IU/L (ref 0–40)
Albumin: 4 g/dL (ref 3.8–4.8)
Alkaline Phosphatase: 181 IU/L — ABNORMAL HIGH (ref 44–121)
Bilirubin Total: 0.5 mg/dL (ref 0.0–1.2)
Bilirubin, Direct: 0.21 mg/dL (ref 0.00–0.40)
Total Protein: 6.6 g/dL (ref 6.0–8.5)

## 2022-04-22 LAB — LIPID PANEL
Chol/HDL Ratio: 4.1 ratio (ref 0.0–5.0)
Cholesterol, Total: 146 mg/dL (ref 100–199)
HDL: 36 mg/dL — ABNORMAL LOW (ref >39)
LDL Chol Calc (NIH): 89 mg/dL (ref 0–99)
Triglycerides: 118 mg/dL (ref 0–149)
VLDL Cholesterol Cal: 21 mg/dL (ref 5–40)

## 2022-04-23 NOTE — Progress Notes (Signed)
Patient ID: Robert Ramirez, male    DOB: [birth year]  MRN: [id number]  CC: Chronic Care Management  Subjective: Robert Ramirez is a 76 y.o. male who presents for chronic care management.  His concerns today include:  Doing well on Metformin and insulin, no issues concerns. Reports not taking Jardiance (managed by his cardiologist) due to out of pocket cost $400 because at the end of health insurance term for the year. Reports health insurance coverage will renew January 2024 and plans to resume at that time. Doing well on Levothyroxine, no issues/concerns. Reports on Bipap machine and was established with Pulmonology. However, has not followed up with them in at least 3 years.    Patient Active Problem List   Diagnosis Date Noted   Poorly-controlled hypertension 04/05/2019   Conductive hearing loss 04/05/2019   Chewing tobacco use 04/05/2019   Bilateral impacted cerumen 04/05/2019   Primary hyperparathyroidism ([place]) 11/21/2018   Chronic respiratory failure with hypoxia and hypercapnia ([place]) 11/18/2018   Chronic obstructive asthma (with obstructive pulmonary disease) 06/09/2018   Other secondary pulmonary hypertension ([place]) 06/09/2018   Healthcare maintenance 03/30/2018   Serum total bilirubin elevated 03/26/2018   Asbestos exposure 03/26/2018   Lesion of skin of scalp 03/26/2018   Coronary artery disease involving native coronary artery of native heart without angina pectoris 03/26/2018   Acquired hypothyroidism 03/26/2018   Pleural plaque 03/14/2018   Diabetes ([place]) 03/14/2018   Somnolence    Hyponatremia    Abnormal chest x-ray    Hypertension 07/18/2014   Mixed hyperlipidemia 07/18/2014   Diabetic retinopathy associated with type 2 diabetes mellitus ([place]) 07/18/2014     Current Outpatient Medications on File Prior to Visit  Medication Sig Dispense Refill   amLODipine (NORVASC) 10 MG tablet TAKE 1 TABLET (10 MG TOTAL) BY MOUTH DAILY. 90 tablet 0   aspirin EC 81 MG EC  tablet Take 1 tablet (81 mg total) by mouth daily. 90 tablet 0   atorvastatin (LIPITOR) 40 MG tablet TAKE 1 TABLET EVERY DAY AT 6 PM 90 tablet 1   blood glucose meter kit and supplies Dispense based on patient and insurance preference. Use up to four times daily as directed. (FOR ICD-10 E10.9, E11.9). 1 each 0   chlorthalidone (HYGROTON) 25 MG tablet TAKE 1 TABLET EVERY DAY 90 tablet 1   Cholecalciferol (VITAMIN D-3) 25 MCG (1000 UT) CAPS Take by mouth.     Cyanocobalamin 2500 MCG TABS Chew by mouth daily.     empagliflozin (JARDIANCE) 10 MG TABS tablet Take 1 tablet (10 mg total) by mouth daily before breakfast. 90 tablet 3   lisinopril (ZESTRIL) 40 MG tablet TAKE 1 TABLET ONE TIME DAILY 90 tablet 1   nitroGLYCERIN (NITROSTAT) 0.4 MG SL tablet Place 1 tablet (0.4 mg total) under the tongue every 5 (five) minutes as needed for chest pain. 25 tablet 3   spironolactone (ALDACTONE) 25 MG tablet TAKE 1 TABLET EVERY DAY 30 tablet 4   No current facility-administered medications on file prior to visit.    No Known Allergies  Social History   Socioeconomic History   Marital status: Married    Spouse name: Not on file   Number of children: Not on file   Years of education: Not on file   Highest education level: Not on file  Occupational History   Not on file  Tobacco Use   Smoking status: Former    Packs/day: 0.50    Years: 37.00    Total pack  years: 18.50    Types: Cigarettes    Quit date: 03/30/1980    Years since quitting: 42.1    Passive exposure: Never   Smokeless tobacco: Current    Types: Chew, Snuff  Vaping Use   Vaping Use: Never used  Substance and Sexual Activity   Alcohol use: No   Drug use: No   Sexual activity: Not Currently  Other Topics Concern   Not on file  Social History Narrative   Not on file   Social Determinants of Health   Financial Resource Strain: Not on file  Food Insecurity: Not on file  Transportation Needs: Not on file  Physical Activity: Not  on file  Stress: Not on file  Social Connections: Not on file  Intimate Partner Violence: Not on file    Family History  Problem Relation Age of Onset   Heart failure Mother    Cancer Father    Diabetes Sister    Cancer Sister     Past Surgical History:  Procedure Laterality Date   APPENDECTOMY     RIGHT/LEFT HEART CATH AND CORONARY ANGIOGRAPHY N/A 03/17/2018   Procedure: RIGHT/LEFT HEART CATH AND CORONARY ANGIOGRAPHY;  Surgeon: Dixie Dials, MD;  Location: [facility];  Service: Cardiovascular;  Laterality: N/A;    ROS: Review of Systems Negative except as stated above  PHYSICAL EXAM: BP 127/66 (BP Location: Left Arm, Patient Position: Sitting, Cuff Size: Normal)   Pulse 86   Temp 98.3 F (36.8 C)   Resp 16   Ht 6' 0.99" (1.854 m)   Wt 190 lb (86.2 kg)   SpO2 94%   BMI 25.07 kg/m   Physical Exam HENT:     Head: Normocephalic and atraumatic.  Eyes:     Extraocular Movements: Extraocular movements intact.     Conjunctiva/sclera: Conjunctivae normal.     Pupils: Pupils are equal, round, and reactive to light.  Cardiovascular:     Rate and Rhythm: Normal rate and regular rhythm.     Pulses: Normal pulses.     Heart sounds: Normal heart sounds.  Pulmonary:     Effort: Pulmonary effort is normal.     Breath sounds: Normal breath sounds.  Musculoskeletal:     Cervical back: Normal range of motion and neck supple.  Neurological:     General: No focal deficit present.     Mental Status: He is alert and oriented to person, place, and time.  Psychiatric:        Mood and Affect: Mood normal.        Behavior: Behavior normal.    Results for orders placed or performed in visit on 04/29/22  POCT glycosylated hemoglobin (Hb A1C)  Result Value Ref Range   Hemoglobin A1C 7.8 (A) 4.0 - 5.6 %   HbA1c POC (<> result, manual entry)     HbA1c, POC (prediabetic range)     HbA1c, POC (controlled diabetic range)      ASSESSMENT AND PLAN: 1. Type 2 diabetes  mellitus with retinopathy, with long-term current use of insulin, macular edema presence unspecified, unspecified laterality, unspecified retinopathy severity (HCC) - Hemoglobin A1c at goal at 7.8%, goal 8.0%. This is improved from previous 8.2%. - Continue Metformin and Insulin NPH as prescribed.  - Patient reports he plans to resume Empagliflozin sometime in January 2024 when his health insurance coverage renews so that he will not have to pay a lot out of pocket.  - Discussed the importance of healthy eating habits, low-carbohydrate  diet, low-sugar diet, regular aerobic exercise (at least 150 minutes a week as tolerated) and medication compliance to achieve or maintain control of diabetes. - Follow-up with primary provider in 3 months or sooner if needed. - POCT glycosylated hemoglobin (Hb A1C) - Insulin NPH, Human,, Isophane, (NOVOLIN N FLEXPEN) 100 UNIT/ML Kiwkpen; Inject 33 Units into the skin 2 (two) times daily with a meal.  Dispense: 15 mL; Refill: 3 - metFORMIN (GLUCOPHAGE) 1000 MG tablet; Take 1 tablet (1,000 mg total) by mouth 2 (two) times daily with a meal.  Dispense: 60 tablet; Refill: 2 - Insulin Pen Needle (DROPLET PEN NEEDLES) 31G X 8 MM MISC; 1 each by Other route 2 (two) times daily with a meal.  Dispense: 200 each; Refill: 0  2. Hypothyroidism, unspecified type - Continue Levothyroxine as prescribed.  - Follow-up with primary provider in 3 months or sooner if needed.  - levothyroxine (SYNTHROID) 50 MCG tablet; Take 1 tablet (50 mcg total) by mouth daily.  Dispense: 30 tablet; Refill: 2  3. Chronic respiratory failure with hypoxia and hypercapnia (HCC) 4. Chronic obstructive asthma (with obstructive pulmonary disease) 5. Other secondary pulmonary hypertension ([place]) 6. Pleural plaque 7. History of asbestos exposure 8. BiPAP (biphasic positive airway pressure) dependence - Referral to Pulmonology for further evaluation/management.  - Ambulatory referral to Pulmonology  9.  Stage 3a chronic kidney disease ([place]) - Referral to Nephrology for further evaluation/management.  - Ambulatory referral to Nephrology   Patient was given the opportunity to ask questions.  Patient verbalized understanding of the plan and was able to repeat key elements of the plan. Patient was given clear instructions to go to Emergency Department or return to medical center if symptoms don't improve, worsen, or new problems develop.The patient verbalized understanding.   Orders Placed This Encounter  Procedures   Ambulatory referral to Pulmonology   Ambulatory referral to Nephrology   POCT glycosylated hemoglobin (Hb A1C)     Requested Prescriptions   Signed Prescriptions Disp Refills   Insulin NPH, Human,, Isophane, (NOVOLIN N FLEXPEN) 100 UNIT/ML Kiwkpen 15 mL 3    Sig: Inject 33 Units into the skin 2 (two) times daily with a meal.   levothyroxine (SYNTHROID) 50 MCG tablet 30 tablet 2    Sig: Take 1 tablet (50 mcg total) by mouth daily.   metFORMIN (GLUCOPHAGE) 1000 MG tablet 60 tablet 2    Sig: Take 1 tablet (1,000 mg total) by mouth 2 (two) times daily with a meal.   Insulin Pen Needle (DROPLET PEN NEEDLES) 31G X 8 MM MISC 200 each 0    Sig: 1 each by Other route 2 (two) times daily with a meal.    Return in about 3 months (around 07/29/2022) for Follow-Up or next available chronic care mgmt.  Camillia Herter, NP

## 2022-04-29 ENCOUNTER — Ambulatory Visit (INDEPENDENT_AMBULATORY_CARE_PROVIDER_SITE_OTHER): Payer: Medicare HMO | Admitting: Family

## 2022-04-29 ENCOUNTER — Encounter: Payer: Self-pay | Admitting: Family

## 2022-04-29 VITALS — BP 127/66 | HR 86 | Temp 98.3°F | Resp 16 | Ht 72.99 in | Wt 190.0 lb

## 2022-04-29 DIAGNOSIS — N1831 Chronic kidney disease, stage 3a: Secondary | ICD-10-CM

## 2022-04-29 DIAGNOSIS — J9612 Chronic respiratory failure with hypercapnia: Secondary | ICD-10-CM

## 2022-04-29 DIAGNOSIS — J4489 Other specified chronic obstructive pulmonary disease: Secondary | ICD-10-CM | POA: Diagnosis not present

## 2022-04-29 DIAGNOSIS — E11319 Type 2 diabetes mellitus with unspecified diabetic retinopathy without macular edema: Secondary | ICD-10-CM

## 2022-04-29 DIAGNOSIS — Z77090 Contact with and (suspected) exposure to asbestos: Secondary | ICD-10-CM

## 2022-04-29 DIAGNOSIS — J929 Pleural plaque without asbestos: Secondary | ICD-10-CM

## 2022-04-29 DIAGNOSIS — E039 Hypothyroidism, unspecified: Secondary | ICD-10-CM | POA: Diagnosis not present

## 2022-04-29 DIAGNOSIS — J9611 Chronic respiratory failure with hypoxia: Secondary | ICD-10-CM | POA: Diagnosis not present

## 2022-04-29 DIAGNOSIS — Z794 Long term (current) use of insulin: Secondary | ICD-10-CM | POA: Diagnosis not present

## 2022-04-29 DIAGNOSIS — Z9989 Dependence on other enabling machines and devices: Secondary | ICD-10-CM

## 2022-04-29 DIAGNOSIS — I2729 Other secondary pulmonary hypertension: Secondary | ICD-10-CM | POA: Diagnosis not present

## 2022-04-29 LAB — POCT GLYCOSYLATED HEMOGLOBIN (HGB A1C): Hemoglobin A1C: 7.8 % — AB (ref 4.0–5.6)

## 2022-04-29 MED ORDER — METFORMIN HCL 1000 MG PO TABS: 1000.0000 mg | ORAL_TABLET | Freq: Two times a day (BID) | ORAL | 2 refills | Status: DC

## 2022-04-29 MED ORDER — LEVOTHYROXINE SODIUM 50 MCG PO TABS: 50.0000 ug | ORAL_TABLET | Freq: Every day | ORAL | 2 refills | Status: DC

## 2022-04-29 MED ORDER — DROPLET PEN NEEDLES 31G X 8 MM MISC: 1.0000 | Freq: Two times a day (BID) | 0 refills | Status: DC

## 2022-04-29 MED ORDER — NOVOLIN N FLEXPEN 100 UNIT/ML ~~LOC~~ SUPN: 33.0000 [IU] | PEN_INJECTOR | Freq: Two times a day (BID) | SUBCUTANEOUS | 3 refills | Status: DC

## 2022-04-29 NOTE — Progress Notes (Signed)
.  Pt presents for chronic care management   

## 2022-05-05 ENCOUNTER — Telehealth: Payer: Self-pay | Admitting: Family

## 2022-05-05 NOTE — Telephone Encounter (Signed)
Good Morning   Referral to [facility] Specialist  was sent to 12/31/21 and another one 12/5.23 . I contacted  [facility]  and they told me that  on  10/16 they leave a voice mail for patient to call back  . I called patient  and  I insist him to call back to schedule  because the provider sent 2 referrals  and  he said that he is not sure  he wants to go  and I offer to give him the phone number to called [facility] when he decide  he said No he will call back to get it .

## 2022-05-15 ENCOUNTER — Encounter (INDEPENDENT_AMBULATORY_CARE_PROVIDER_SITE_OTHER): Payer: Medicare HMO | Admitting: Ophthalmology

## 2022-05-21 ENCOUNTER — Telehealth: Payer: Self-pay | Admitting: Internal Medicine

## 2022-05-21 DIAGNOSIS — E1169 Type 2 diabetes mellitus with other specified complication: Secondary | ICD-10-CM

## 2022-05-21 MED ORDER — ATORVASTATIN CALCIUM 80 MG PO TABS: 80.0000 mg | ORAL_TABLET | Freq: Every day | ORAL | 3 refills | Status: DC

## 2022-05-21 NOTE — Telephone Encounter (Signed)
-----   Message from Orbie Pyo, MD sent at 04/22/2022  7:04 AM EST ----- Please have him increase atorva to 80 given LDL > 70.

## 2022-05-21 NOTE — Telephone Encounter (Signed)
Called and spoke with patient. He agrees to increase Atorvastatin to 80mg  daily. He just picked up a refill of 90 tablets, so he states he'll take 2 tablets of what he has on hand to use up, then pick up the new rx after first of the year. No further questions. Rx sent to pharmacy on file.

## 2022-05-23 NOTE — Telephone Encounter (Signed)
Patient is following up stating that he has not been taking Lipitor at all. He would like a call back to discuss.

## 2022-05-27 NOTE — Telephone Encounter (Signed)
Left message for patient to call back  

## 2022-05-30 NOTE — Telephone Encounter (Signed)
Called patient back and confirmed that he is taking Lipitor/atorvastatin.  He did not recognize Lipitor as the name - that is why he called and said he hadn't been taking it.   Scheduled follow up lab appointment (lipids/liver) per Dr. Ali Lowe.

## 2022-05-30 NOTE — Addendum Note (Signed)
Addended by: Rodman Key on: 05/30/2022 01:38 PM   Modules accepted: Orders

## 2022-07-22 ENCOUNTER — Ambulatory Visit: Payer: Medicare HMO | Attending: Internal Medicine

## 2022-07-22 DIAGNOSIS — E785 Hyperlipidemia, unspecified: Secondary | ICD-10-CM | POA: Diagnosis not present

## 2022-07-22 DIAGNOSIS — E1169 Type 2 diabetes mellitus with other specified complication: Secondary | ICD-10-CM

## 2022-07-23 LAB — HEPATIC FUNCTION PANEL
ALT: 18 IU/L (ref 0–44)
AST: 23 IU/L (ref 0–40)
Albumin: 4.2 g/dL (ref 3.8–4.8)
Alkaline Phosphatase: 98 IU/L (ref 44–121)
Bilirubin Total: 0.9 mg/dL (ref 0.0–1.2)
Bilirubin, Direct: 0.25 mg/dL (ref 0.00–0.40)
Total Protein: 6.3 g/dL (ref 6.0–8.5)

## 2022-07-23 LAB — LIPID PANEL
Chol/HDL Ratio: 2.6 ratio (ref 0.0–5.0)
Cholesterol, Total: 129 mg/dL (ref 100–199)
HDL: 49 mg/dL (ref >39)
LDL Chol Calc (NIH): 65 mg/dL (ref 0–99)
Triglycerides: 75 mg/dL (ref 0–149)
VLDL Cholesterol Cal: 15 mg/dL (ref 5–40)

## 2022-07-28 ENCOUNTER — Encounter: Payer: Self-pay | Admitting: *Deleted

## 2022-08-04 ENCOUNTER — Encounter: Payer: Self-pay | Admitting: Family

## 2022-08-04 ENCOUNTER — Ambulatory Visit: Payer: Medicare HMO | Admitting: Family

## 2022-08-04 ENCOUNTER — Other Ambulatory Visit: Payer: Self-pay | Admitting: Family

## 2022-08-04 ENCOUNTER — Ambulatory Visit (INDEPENDENT_AMBULATORY_CARE_PROVIDER_SITE_OTHER): Payer: Medicare HMO | Admitting: Family

## 2022-08-04 ENCOUNTER — Other Ambulatory Visit: Payer: Self-pay | Admitting: Internal Medicine

## 2022-08-04 VITALS — BP 99/62 | HR 62 | Temp 98.3°F | Resp 16 | Ht 72.99 in | Wt 190.0 lb

## 2022-08-04 DIAGNOSIS — Z0000 Encounter for general adult medical examination without abnormal findings: Secondary | ICD-10-CM

## 2022-08-04 DIAGNOSIS — E11319 Type 2 diabetes mellitus with unspecified diabetic retinopathy without macular edema: Secondary | ICD-10-CM

## 2022-08-04 DIAGNOSIS — Z130 Encounter for screening for diseases of the blood and blood-forming organs and certain disorders involving the immune mechanism: Secondary | ICD-10-CM

## 2022-08-04 DIAGNOSIS — Z1211 Encounter for screening for malignant neoplasm of colon: Secondary | ICD-10-CM

## 2022-08-04 DIAGNOSIS — Z794 Long term (current) use of insulin: Secondary | ICD-10-CM | POA: Diagnosis not present

## 2022-08-04 DIAGNOSIS — E1165 Type 2 diabetes mellitus with hyperglycemia: Secondary | ICD-10-CM

## 2022-08-04 DIAGNOSIS — E039 Hypothyroidism, unspecified: Secondary | ICD-10-CM

## 2022-08-04 DIAGNOSIS — E119 Type 2 diabetes mellitus without complications: Secondary | ICD-10-CM

## 2022-08-04 DIAGNOSIS — I10 Essential (primary) hypertension: Secondary | ICD-10-CM

## 2022-08-04 LAB — POCT GLYCOSYLATED HEMOGLOBIN (HGB A1C): Hemoglobin A1C: 7.6 % — AB (ref 4.0–5.6)

## 2022-08-04 MED ORDER — DROPLET PEN NEEDLES 31G X 8 MM MISC: 1.0000 | Freq: Two times a day (BID) | 0 refills | Status: AC

## 2022-08-04 MED ORDER — AMLODIPINE BESYLATE 10 MG PO TABS: 10.0000 mg | ORAL_TABLET | Freq: Every day | ORAL | 2 refills | Status: DC

## 2022-08-04 MED ORDER — LEVOTHYROXINE SODIUM 50 MCG PO TABS: 50.0000 ug | ORAL_TABLET | Freq: Every day | ORAL | 2 refills | Status: DC

## 2022-08-04 MED ORDER — METFORMIN HCL 1000 MG PO TABS: 1000.0000 mg | ORAL_TABLET | Freq: Two times a day (BID) | ORAL | 2 refills | Status: DC

## 2022-08-04 MED ORDER — NOVOLIN N FLEXPEN 100 UNIT/ML ~~LOC~~ SUPN: 33.0000 [IU] | PEN_INJECTOR | Freq: Two times a day (BID) | SUBCUTANEOUS | 3 refills | Status: DC

## 2022-08-04 NOTE — Progress Notes (Signed)
Subjective:   Robert Ramirez is a 77 y.o. male who presents for Medicare Annual/Subsequent preventive examination.  Review of Systems    Defer to PCP  Cardiac Risk Factors include: advanced age (>72mn, >>86women);diabetes mellitus;hypertension;male gender;smoking/ tobacco exposure     Objective:    Today's Vitals   08/04/22 1423  BP: 99/62  Pulse: 62  Resp: 16  Temp: 98.3 F (36.8 C)  SpO2: 96%  Weight: 190 lb (86.2 kg)  Height: 6' 0.99" (1.854 m)  PainSc: 0-No pain   Body mass index is 25.07 kg/m.     08/04/2022    2:24 PM 12/03/2020    2:14 PM 05/12/2018    8:49 PM 03/13/2018    5:22 AM 03/13/2018   12:43 AM  Advanced Directives  Does Patient Have a Medical Advance Directive? No No No No No  Would patient like information on creating a medical advance directive? Yes (Inpatient - patient defers creating a medical advance directive at this time - Information given) Yes (Inpatient - patient defers creating a medical advance directive at this time - Information given) No - Patient declined No - Patient declined No - Patient declined    Current Medications (verified) Outpatient Encounter Medications as of 08/04/2022  Medication Sig   amLODipine (NORVASC) 10 MG tablet TAKE 1 TABLET (10 MG TOTAL) BY MOUTH DAILY.   aspirin EC 81 MG EC tablet Take 1 tablet (81 mg total) by mouth daily.   atorvastatin (LIPITOR) 80 MG tablet Take 1 tablet (80 mg total) by mouth daily.   blood glucose meter kit and supplies Dispense based on patient and insurance preference. Use up to four times daily as directed. (FOR ICD-10 E10.9, E11.9).   chlorthalidone (HYGROTON) 25 MG tablet TAKE 1 TABLET EVERY DAY   Cholecalciferol (VITAMIN D-3) 25 MCG (1000 UT) CAPS Take by mouth.   Cyanocobalamin 2500 MCG TABS Chew by mouth daily.   empagliflozin (JARDIANCE) 10 MG TABS tablet Take 1 tablet (10 mg total) by mouth daily before breakfast.   Insulin NPH, Human,, Isophane, (NOVOLIN N FLEXPEN) 100 UNIT/ML  Kiwkpen Inject 33 Units into the skin 2 (two) times daily with a meal.   Insulin Pen Needle (DROPLET PEN NEEDLES) 31G X 8 MM MISC 1 each by Other route 2 (two) times daily with a meal.   levothyroxine (SYNTHROID) 50 MCG tablet Take 1 tablet (50 mcg total) by mouth daily.   lisinopril (ZESTRIL) 40 MG tablet TAKE 1 TABLET ONE TIME DAILY   metFORMIN (GLUCOPHAGE) 1000 MG tablet Take 1 tablet (1,000 mg total) by mouth 2 (two) times daily with a meal.   nitroGLYCERIN (NITROSTAT) 0.4 MG SL tablet Place 1 tablet (0.4 mg total) under the tongue every 5 (five) minutes as needed for chest pain.   spironolactone (ALDACTONE) 25 MG tablet TAKE 1 TABLET EVERY DAY   No facility-administered encounter medications on file as of 08/04/2022.    Allergies (verified) Patient has no known allergies.   History: Past Medical History:  Diagnosis Date   Diabetes mellitus    High cholesterol    Hypertension    Past Surgical History:  Procedure Laterality Date   APPENDECTOMY     RIGHT/LEFT HEART CATH AND CORONARY ANGIOGRAPHY N/A 03/17/2018   Procedure: RIGHT/LEFT HEART CATH AND CORONARY ANGIOGRAPHY;  Surgeon: KDixie Dials MD;  [facility];  Service: Cardiovascular;  Laterality: N/A;   Family History  Problem Relation Age of Onset   Heart failure Mother    Cancer Father  Diabetes Sister    Cancer Sister    Social History   Socioeconomic History   Marital status: Married    Spouse name: Not on file   Number of children: Not on file   Years of education: Not on file   Highest education level: Not on file  Occupational History   Not on file  Tobacco Use   Smoking status: Former    Packs/day: 0.50    Years: 37.00    Total pack years: 18.50    Types: Cigarettes    Quit date: 03/30/1980    Years since quitting: 42.3    Passive exposure: Never   Smokeless tobacco: Current    Types: Chew, Snuff  Vaping Use   Vaping Use: Never used  Substance and Sexual Activity   Alcohol use:  No   Drug use: No   Sexual activity: Not Currently  Other Topics Concern   Not on file  Social History Narrative   Not on file   Social Determinants of Health   Financial Resource Strain: Not on file  Food Insecurity: No Food Insecurity (08/04/2022)   Hunger Vital Sign    Worried About Running Out of Food in the Last Year: Never true    Ran Out of Food in the Last Year: Never true  Transportation Needs: No Transportation Needs (08/04/2022)   PRAPARE - Hydrologist (Medical): No    Lack of Transportation (Non-Medical): No  Physical Activity: Not on file  Stress: Not on file  Social Connections: Not on file    Tobacco Counseling Ready to quit: Not Answered Counseling given: Not Answered   Clinical Intake:  Pre-visit preparation completed: Yes  Pain : 0-10 Pain Score: 0-No pain     Diabetes: Yes CBG done?: No Did pt. bring in CBG monitor from home?: No  How often do you need to have someone help you when you read instructions, pamphlets, or other written materials from your doctor or pharmacy?: 1 - Never  Diabetic?Yes  Interpreter Needed?: No      Activities of Daily Living    08/04/2022    2:24 PM  In your present state of health, do you have any difficulty performing the following activities:  Hearing? 0  Vision? 0  Difficulty concentrating or making decisions? 0  Walking or climbing stairs? 0  Dressing or bathing? 0  Doing errands, shopping? 0  Preparing Food and eating ? N  Using the Toilet? N  In the past six months, have you accidently leaked urine? N  Do you have problems with loss of bowel control? N  Managing your Medications? N  Managing your Finances? N  Housekeeping or managing your Housekeeping? N    Patient Care Team: Camillia Herter, NP as PCP - General (Nurse Practitioner) Early Osmond, MD as PCP - Cardiology (Cardiology)  Indicate any recent Medical Services you may have received from other than [facility]  providers in the past year (date may be approximate).     Assessment:   This is a routine wellness examination for Robert Ramirez.  Hearing/Vision screen No results found.  Dietary issues and exercise activities discussed: Current Exercise Habits: The patient does not participate in regular exercise at present, Exercise limited by: cardiac condition(s)   Goals Addressed   None   Depression Screen    08/04/2022    2:24 PM 01/28/2022    1:32 PM 12/30/2021    2:27 PM 09/10/2021    9:52 AM  06/11/2021    8:46 AM 03/15/2021   10:24 AM 12/28/2020   10:50 AM  PHQ 2/9 Scores  PHQ - 2 Score 0 0 0 0 0 0 0  PHQ- 9 Score  0    0 1    Fall Risk    08/04/2022    2:24 PM 12/30/2021    2:26 PM 03/15/2021    9:51 AM 12/03/2020    2:39 PM 09/03/2020    1:50 PM  Fall Risk   Falls in the past year? 0 0 0 0 0  Number falls in past yr: 0 0 0 0 0  Injury with Fall? 0 0 0 0 0  Risk for fall due to : No Fall Risks No Fall Risks  No Fall Risks No Fall Risks  Follow up Falls evaluation completed Falls evaluation completed  Falls evaluation completed Falls evaluation completed    [place]:  Any stairs in or around the home? No  If so, are there any without handrails?  N/A Home free of loose throw rugs in walkways, pet beds, electrical cords, etc? Yes  Adequate lighting in your home to reduce risk of falls? Yes   ASSISTIVE DEVICES UTILIZED TO PREVENT FALLS:  Life alert? No  Use of a cane, walker or w/c? No  Grab bars in the bathroom? Yes  Shower chair or bench in shower? No  Elevated toilet seat or a handicapped toilet? No   TIMED UP AND GO:  Was the test performed? .  Length of time to ambulate 10 feet:  sec.     Cognitive Function:    12/03/2020    2:15 PM  MMSE - Mini Mental State Exam  Orientation to time 5  Orientation to Place 5  Registration 3  Attention/ Calculation 5  Recall 3  Language- name 2 objects 2  Language- repeat 1  Language- follow 3 step  command 3  Language- read & follow direction 1  Write a sentence 1  Copy design 1  Total score 30        08/04/2022    2:25 PM  6CIT Screen  What Year? 0 points  What month? 0 points  What time? 0 points  Count back from 20 0 points  Months in reverse 0 points  Repeat phrase 0 points  Total Score 0 points    Immunizations Immunization History  Administered Date(s) Administered   Influenza, High Dose Seasonal PF 04/20/2013, 03/14/2018, 03/01/2019   Influenza-Unspecified 04/20/2013, 04/20/2013   Pneumococcal Conjugate-13 03/21/2009, 01/19/2015    TDAP status: Up to date  Flu Vaccine status: Declined, Education has been provided regarding the importance of this vaccine but patient still declined. Advised may receive this vaccine at local pharmacy or Health Dept. Aware to provide a copy of the vaccination record if obtained from local pharmacy or Health Dept. Verbalized acceptance and understanding.  Pneumococcal vaccine status: Up to date  Covid-19 vaccine status: Completed vaccines  Qualifies for Shingles Vaccine? Yes   Zostavax completed No   Shingrix Completed?: Yes  Screening Tests Health Maintenance  Topic Date Due   COVID-19 Vaccine (1) Never done   OPHTHALMOLOGY EXAM  Never done   Diabetic kidney evaluation - Urine ACR  Never done   DTaP/Tdap/Td (1 - Tdap) Never done   COLONOSCOPY (Pts 45-68yr Insurance coverage will need to be confirmed)  Never done   Zoster Vaccines- Shingrix (1 of 2) Never done   Pneumonia Vaccine 65+ Years  old (2 of 2 - PPSV23 or PCV20) 03/16/2015   FOOT EXAM  12/03/2021   INFLUENZA VACCINE  08/24/2022 (Originally 12/24/2021)   Diabetic kidney evaluation - eGFR measurement  12/31/2022   HEMOGLOBIN A1C  02/04/2023   Medicare Annual Wellness (AWV)  08/04/2023   Hepatitis C Screening  Completed   HPV VACCINES  Aged Out    Health Maintenance  Health Maintenance Due  Topic Date Due   COVID-19 Vaccine (1) Never done   OPHTHALMOLOGY EXAM   Never done   Diabetic kidney evaluation - Urine ACR  Never done   DTaP/Tdap/Td (1 - Tdap) Never done   COLONOSCOPY (Pts 45-70yr Insurance coverage will need to be confirmed)  Never done   Zoster Vaccines- Shingrix (1 of 2) Never done   Pneumonia Vaccine 76 Years old (2 of 2 - PPSV23 or PCV20) 03/16/2015   FOOT EXAM  12/03/2021    Colorectal cancer screening: No longer required. -Pt declined Colonoscopy and Cologuard screening   Lung Cancer Screening: (Low Dose CT Chest recommended if Age 76-80years, 30 pack-year currently smoking OR have quit w/in 15years.) does not qualify.   Lung Cancer Screening Referral: N/A  Additional Screening:  Hepatitis C Screening: does qualify; Completed 12/03/2020  Vision Screening: Recommended annual ophthalmology exams for early detection of glaucoma and other disorders of the eye. Is the patient up to date with their annual eye exam?  Yes  Who is the provider or what is the name of the office in which the patient attends annual eye exams? [facility] JTempie Hoist MD  If pt is not established with a provider, would they like to be referred to a provider to establish care?  N/A .   Dental Screening: Recommended annual dental exams for proper oral hygiene  Community Resource Referral / Chronic Care Management: CRR required this visit?  No   CCM required this visit?  No      Plan:     I have personally reviewed and noted the following in the patient's chart:   Medical and social history Use of alcohol, tobacco or illicit drugs  Current medications and supplements including opioid prescriptions. Patient is not currently taking opioid prescriptions. Functional ability and status Nutritional status Physical activity Advanced directives List of other physicians Hospitalizations, surgeries, and ER visits in previous 12 months Vitals Screenings to include cognitive, depression, and falls Referrals and  appointments  In addition, I have reviewed and discussed with patient certain preventive protocols, quality metrics, and best practice recommendations. A written personalized care plan for preventive services as well as general preventive health recommendations were provided to patient.     [facility]lmon Else [facility]  08/04/2022

## 2022-08-04 NOTE — Progress Notes (Signed)
Subjective:   Robert Ramirez is a 77 y.o. male who presents for Medicare Annual/Subsequent preventive examination.  Review of Systems    - Established with Cardiology.   Cardiac Risk Factors include: advanced age (>36mn, >>37women);diabetes mellitus;hypertension;male gender;smoking/ tobacco exposure  Objective:    Today's Vitals   08/04/22 1423  BP: 99/62  Pulse: 62  Resp: 16  Temp: 98.3 F (36.8 C)  SpO2: 96%  Weight: 190 lb (86.2 kg)  Height: 6' 0.99" (1.854 m)  PainSc: 0-No pain   Body mass index is 25.07 kg/m.  Physical Exam HENT:     Head: Normocephalic and atraumatic.     Right Ear: Tympanic membrane, ear canal and external ear normal.     Left Ear: Tympanic membrane, ear canal and external ear normal.     Nose: Nose normal.     Mouth/Throat:     Mouth: Mucous membranes are moist.     Pharynx: Oropharynx is clear.  Eyes:     Extraocular Movements: Extraocular movements intact.     Conjunctiva/sclera: Conjunctivae normal.     Pupils: Pupils are equal, round, and reactive to light.  Cardiovascular:     Rate and Rhythm: Normal rate and regular rhythm.     Pulses: Normal pulses.     Heart sounds: Normal heart sounds.  Pulmonary:     Effort: Pulmonary effort is normal.     Breath sounds: Normal breath sounds.  Abdominal:     General: Bowel sounds are normal.     Palpations: Abdomen is soft.  Genitourinary:    Comments: Patient declined.  Musculoskeletal:        General: Normal range of motion.     Right shoulder: Normal.     Left shoulder: Normal.     Right upper arm: Normal.     Left upper arm: Normal.     Right elbow: Normal.     Left elbow: Normal.     Right forearm: Normal.     Left forearm: Normal.     Right wrist: Normal.     Left wrist: Normal.     Right hand: Normal.     Left hand: Normal.     Cervical back: Normal, normal range of motion and neck supple.     Thoracic back: Normal.     Lumbar back: Normal.     Right hip: Normal.     Left  hip: Normal.     Right upper leg: Normal.     Left upper leg: Normal.     Right knee: Normal.     Left knee: Normal.     Right lower leg: Normal.     Left lower leg: Normal.     Right ankle: Normal.     Left ankle: Normal.     Right foot: Normal.     Left foot: Normal.  Skin:    General: Skin is warm and dry.     Capillary Refill: Capillary refill takes less than 2 seconds.  Neurological:     General: No focal deficit present.     Mental Status: He is alert and oriented to person, place, and time.  Psychiatric:        Mood and Affect: Mood normal.        Behavior: Behavior normal.    Results for orders placed or performed in visit on 08/04/22  POCT glycosylated hemoglobin (Hb A1C)  Result Value Ref Range   Hemoglobin A1C 7.6 (A) 4.0 - 5.6 %  HbA1c POC (<> result, manual entry)     HbA1c, POC (prediabetic range)     HbA1c, POC (controlled diabetic range)         08/04/2022    2:24 PM 12/03/2020    2:14 PM 05/12/2018    8:49 PM 03/13/2018    5:22 AM 03/13/2018   12:43 AM  Advanced Directives  Does Patient Have a Medical Advance Directive? No No No No No  Would patient like information on creating a medical advance directive? Yes (Inpatient - patient defers creating a medical advance directive at this time - Information given) Yes (Inpatient - patient defers creating a medical advance directive at this time - Information given) No - Patient declined No - Patient declined No - Patient declined    Current Medications (verified) Outpatient Encounter Medications as of 08/04/2022  Medication Sig   amLODipine (NORVASC) 10 MG tablet TAKE 1 TABLET (10 MG TOTAL) BY MOUTH DAILY.   aspirin EC 81 MG EC tablet Take 1 tablet (81 mg total) by mouth daily.   atorvastatin (LIPITOR) 80 MG tablet Take 1 tablet (80 mg total) by mouth daily.   blood glucose meter kit and supplies Dispense based on patient and insurance preference. Use up to four times daily as directed. (FOR ICD-10 E10.9,  E11.9).   chlorthalidone (HYGROTON) 25 MG tablet TAKE 1 TABLET EVERY DAY   Cholecalciferol (VITAMIN D-3) 25 MCG (1000 UT) CAPS Take by mouth.   Cyanocobalamin 2500 MCG TABS Chew by mouth daily.   empagliflozin (JARDIANCE) 10 MG TABS tablet Take 1 tablet (10 mg total) by mouth daily before breakfast.   Insulin NPH, Human,, Isophane, (NOVOLIN N FLEXPEN) 100 UNIT/ML Kiwkpen Inject 33 Units into the skin 2 (two) times daily with a meal.   Insulin Pen Needle (DROPLET PEN NEEDLES) 31G X 8 MM MISC 1 each by Other route 2 (two) times daily with a meal.   levothyroxine (SYNTHROID) 50 MCG tablet Take 1 tablet (50 mcg total) by mouth daily.   lisinopril (ZESTRIL) 40 MG tablet TAKE 1 TABLET ONE TIME DAILY   metFORMIN (GLUCOPHAGE) 1000 MG tablet Take 1 tablet (1,000 mg total) by mouth 2 (two) times daily with a meal.   nitroGLYCERIN (NITROSTAT) 0.4 MG SL tablet Place 1 tablet (0.4 mg total) under the tongue every 5 (five) minutes as needed for chest pain.   spironolactone (ALDACTONE) 25 MG tablet TAKE 1 TABLET EVERY DAY   [DISCONTINUED] Insulin NPH, Human,, Isophane, (NOVOLIN N FLEXPEN) 100 UNIT/ML Kiwkpen Inject 33 Units into the skin 2 (two) times daily with a meal.   [DISCONTINUED] Insulin Pen Needle (DROPLET PEN NEEDLES) 31G X 8 MM MISC 1 each by Other route 2 (two) times daily with a meal.   [DISCONTINUED] levothyroxine (SYNTHROID) 50 MCG tablet Take 1 tablet (50 mcg total) by mouth daily.   [DISCONTINUED] metFORMIN (GLUCOPHAGE) 1000 MG tablet Take 1 tablet (1,000 mg total) by mouth 2 (two) times daily with a meal.   No facility-administered encounter medications on file as of 08/04/2022.    Allergies (verified) Patient has no known allergies.   History: Past Medical History:  Diagnosis Date   Diabetes mellitus    High cholesterol    Hypertension    Past Surgical History:  Procedure Laterality Date   APPENDECTOMY     RIGHT/LEFT HEART CATH AND CORONARY ANGIOGRAPHY N/A 03/17/2018   Procedure:  RIGHT/LEFT HEART CATH AND CORONARY ANGIOGRAPHY;  Surgeon: Dixie Dials, MD;  Location: [facility];  Service: Cardiovascular;  Laterality: N/A;   Family History  Problem Relation Age of Onset   Heart failure Mother    Cancer Father    Diabetes Sister    Cancer Sister    Social History   Socioeconomic History   Marital status: Married    Spouse name: Not on file   Number of children: Not on file   Years of education: Not on file   Highest education level: Not on file  Occupational History   Not on file  Tobacco Use   Smoking status: Former    Packs/day: 0.50    Years: 37.00    Total pack years: 18.50    Types: Cigarettes    Quit date: 03/30/1980    Years since quitting: 42.3    Passive exposure: Never   Smokeless tobacco: Current    Types: Chew, Snuff  Vaping Use   Vaping Use: Never used  Substance and Sexual Activity   Alcohol use: No   Drug use: No   Sexual activity: Not Currently  Other Topics Concern   Not on file  Social History Narrative   Not on file   Social Determinants of Health   Financial Resource Strain: Not on file  Food Insecurity: No Food Insecurity (08/04/2022)   Hunger Vital Sign    Worried About Running Out of Food in the Last Year: Never true    Ran Out of Food in the Last Year: Never true  Transportation Needs: No Transportation Needs (08/04/2022)   PRAPARE - Hydrologist (Medical): No    Lack of Transportation (Non-Medical): No  Physical Activity: Not on file  Stress: Not on file  Social Connections: Not on file    Tobacco Counseling Does not currently smoke. Last time smoking was 40 years ago.  Clinical Intake:  Pre-visit preparation completed: Yes  Pain : 0-10 Pain Score: 0-No pain  Diabetes: Yes CBG done?: No Did pt. bring in CBG monitor from home?: No  How often do you need to have someone help you when you read instructions, pamphlets, or other written materials from your doctor or  pharmacy?: 1 - Never  Diabetic? Yes  Interpreter Needed?: No   Activities of Daily Living    08/04/2022    2:24 PM  In your present state of health, do you have any difficulty performing the following activities:  Hearing? 0  Vision? 0  Difficulty concentrating or making decisions? 0  Walking or climbing stairs? 0  Dressing or bathing? 0  Doing errands, shopping? 0  Preparing Food and eating ? N  Using the Toilet? N  In the past six months, have you accidently leaked urine? N  Do you have problems with loss of bowel control? N  Managing your Medications? N  Managing your Finances? N  Housekeeping or managing your Housekeeping? N    Patient Care Team: Ralene Muskrat as PCP - General (Family Medicine)  Early Osmond, MD as Consulting Physician - Cardiology   Indicate any recent Medical Services you may have received from other than [facility] providers in the past year (date may be approximate).    Assessment:   This is a routine wellness examination for [place].  Hearing/Vision screen - Hearing normal. - Established with an eye doctor.   Dietary issues and exercise activities discussed: Current Exercise Habits: The patient does not participate in regular exercise at present, Exercise limited by: cardiac condition(s)  Goals Addressed: keep health stable.   Depression Screen  08/04/2022    2:24 PM 01/28/2022    1:32 PM 12/30/2021    2:27 PM 09/10/2021    9:52 AM 06/11/2021    8:46 AM 03/15/2021   10:24 AM 12/28/2020   10:50 AM  PHQ 2/9 Scores  PHQ - 2 Score 0 0 0 0 0 0 0  PHQ- 9 Score  0    0 1    Fall Risk    08/04/2022    2:24 PM 12/30/2021    2:26 PM 03/15/2021    9:51 AM 12/03/2020    2:39 PM 09/03/2020    1:50 PM  Fall Risk   Falls in the past year? 0 0 0 0 0  Number falls in past yr: 0 0 0 0 0  Injury with Fall? 0 0 0 0 0  Risk for fall due to : No Fall Risks No Fall Risks  No Fall Risks No Fall Risks  Follow up Falls evaluation completed Falls  evaluation completed  Falls evaluation completed Falls evaluation completed    [place]: Any stairs in or around the home? No  If so, are there any without handrails?  N/A Home free of loose throw rugs in walkways, pet beds, electrical cords, etc? Yes  Adequate lighting in your home to reduce risk of falls? Yes   ASSISTIVE DEVICES UTILIZED TO PREVENT FALLS: Life alert? No  Use of a cane, walker or w/c? No  Grab bars in the bathroom? Yes  Shower chair or bench in shower? No  Elevated toilet seat or a handicapped toilet? No   TIMED UP AND GO:  Was the test performed? Yes .  Length of time to ambulate 10 feet: 8 sec.   Gait slow and steady without use of assistive device  Cognitive Function:    12/03/2020    2:15 PM  MMSE - Mini Mental State Exam  Orientation to time 5  Orientation to Place 5  Registration 3  Attention/ Calculation 5  Recall 3  Language- name 2 objects 2  Language- repeat 1  Language- follow 3 step command 3  Language- read & follow direction 1  Write a sentence 1  Copy design 1  Total score 30        08/04/2022    2:25 PM  6CIT Screen  What Year? 0 points  What month? 0 points  What time? 0 points  Count back from 20 0 points  Months in reverse 0 points  Repeat phrase 0 points  Total Score 0 points    Immunizations Immunization History  Administered Date(s) Administered   Influenza, High Dose Seasonal PF 04/20/2013, 03/14/2018, 03/01/2019   Influenza-Unspecified 04/20/2013, 04/20/2013   Pneumococcal Conjugate-13 03/21/2009, 01/19/2015   Pneumococcal Polysaccharide-23 01/28/2021   Tdap 01/28/2021   Zoster Recombinat (Shingrix) 07/24/2020, 10/24/2020    TDAP status: Due, Education has been provided regarding the importance of this vaccine. Advised may receive this vaccine at local pharmacy or Health Dept. Aware to provide a copy of the vaccination record if obtained from local pharmacy or Health Dept.  Verbalized acceptance and understanding. Patient declined.   Flu Vaccine status: Declined, Education has been provided regarding the importance of this vaccine but patient still declined. Advised may receive this vaccine at local pharmacy or Health Dept. Aware to provide a copy of the vaccination record if obtained from local pharmacy or Health Dept. Verbalized acceptance and understanding.  Pneumococcal vaccine status: Up to date  Covid-19 vaccine status:  Completed vaccines  Qualifies for Shingles Vaccine? Yes   Zostavax completed No   Shingrix Completed?: Yes  Screening Tests Health Maintenance  Topic Date Due   COVID-19 Vaccine (1) Never done   OPHTHALMOLOGY EXAM  Never done   Diabetic kidney evaluation - Urine ACR  Never done   COLONOSCOPY (Pts 45-24yr Insurance coverage will need to be confirmed)  Never done   FOOT EXAM  12/03/2021   INFLUENZA VACCINE  08/24/2022 (Originally 12/24/2021)   Diabetic kidney evaluation - eGFR measurement  12/31/2022   HEMOGLOBIN A1C  02/04/2023   Medicare Annual Wellness (AWV)  08/04/2023   DTaP/Tdap/Td (2 - Td or Tdap) 01/29/2031   Pneumonia Vaccine 77 Years old  Completed   Hepatitis C Screening  Completed   Zoster Vaccines- Shingrix  Completed   HPV VACCINES  Aged Out    Health Maintenance  Health Maintenance Due  Topic Date Due   COVID-19 Vaccine (1) Never done   OPHTHALMOLOGY EXAM  Never done   Diabetic kidney evaluation - Urine ACR  Never done   COLONOSCOPY (Pts 45-440yrInsurance coverage will need to be confirmed)  Never done   FOOT EXAM  12/03/2021    Colorectal cancer screening: No longer required.   Lung Cancer Screening: (Low Dose CT Chest recommended if Age 76-80ears, 30 pack-year currently smoking OR have quit w/in 15years.) does not qualify.   Lung Cancer Screening Referral: N/A  Additional Screening:  Hepatitis C Screening: does qualify; Completed 12/03/2020  Vision Screening: Recommended annual ophthalmology  exams for early detection of glaucoma and other disorders of the eye. Is the patient up to date with their annual eye exam?  Yes  Who is the provider or what is the name of the office in which the patient attends annual eye exams[facility]  If pt is not established with a provider, would they like to be referred to a provider to establish care?  N/A .   Dental Screening: Recommended annual dental exams for proper oral hygiene  Community Resource Referral / Chronic Care Management: CRR required this visit?  No   CCM required this visit?  No    Plan:  1. Medicare annual wellness visit, subsequent 2. Annual physical exam - Counseled on 150 minutes of exercise per week as tolerated, healthy eating (including decreased daily intake of saturated fats, cholesterol, added sugars, sodium), STI prevention, and routine healthcare maintenance.  3. Type 2 diabetes mellitus with hyperglycemia, with long-term current use of insulin (HCC) - Hemoglobin A1c at goal at 7.6%, goal 8%.  - Continue Metformin and Insulin NPH, Human, Isophane as prescribed.  - Discussed the importance of healthy eating habits, low-carbohydrate diet, low-sugar diet, regular aerobic exercise (at least 150 minutes a week as tolerated) and medication compliance to achieve or maintain control of diabetes. - Follow-up with primary provider in 3 months or sooner if needed.  - Microalbumin / creatinine urine ratio - POCT glycosylated hemoglobin (Hb A1C) - Insulin NPH, Human,, Isophane, (NOVOLIN N FLEXPEN) 100 UNIT/ML Kiwkpen; Inject 33 Units into the skin 2 (two) times daily with a meal.  Dispense: 15 mL; Refill: 3 - metFORMIN (GLUCOPHAGE) 1000 MG tablet; Take 1 tablet (1,000 mg total) by mouth 2 (two) times daily with a meal.  Dispense: 60 tablet; Refill: 2 - Insulin Pen Needle (DROPLET PEN NEEDLES) 31G X 8 MM MISC; 1 each by Other route 2 (two) times daily with a meal.  Dispense:  200 each; Refill: 0  4. Hypothyroidism, unspecified type - Continue Levothyroxine as prescribed.  - Follow-up with primary provider as scheduled.  - levothyroxine (SYNTHROID) 50 MCG tablet; Take 1 tablet (50 mcg total) by mouth daily.  Dispense: 30 tablet; Refill: 2  5. Screening for deficiency anemia - Routine screening.  - CBC  6. Colon cancer screening - Referral to Gastroenterology for further evaluation/management.  - Ambulatory referral to Gastroenterology   I have personally reviewed and noted the following in the patient's chart:   Medical and social history Use of alcohol, tobacco or illicit drugs  Current medications and supplements including opioid prescriptions. Patient is not currently taking opioid prescriptions. Functional ability and status Nutritional status Physical activity Advanced directives List of other physicians Hospitalizations, surgeries, and ER visits in previous 12 months Vitals Screenings to include cognitive, depression, and falls Referrals and appointments  In addition, I have reviewed and discussed with patient certain preventive protocols, quality metrics, and best practice recommendations. A written personalized care plan for preventive services as well as general preventive health recommendations were provided to patient.     Camillia Herter, NP   08/04/2022

## 2022-08-04 NOTE — Telephone Encounter (Signed)
Order complete. 

## 2022-08-05 LAB — MICROALBUMIN / CREATININE URINE RATIO
Creatinine, Urine: 69.5 mg/dL
Microalb/Creat Ratio: 63 mg/g creat — ABNORMAL HIGH (ref 0–29)
Microalbumin, Urine: 44.1 ug/mL

## 2022-08-05 LAB — CBC
Hematocrit: 46.2 % (ref 37.5–51.0)
Hemoglobin: 14.9 g/dL (ref 13.0–17.7)
MCH: 29.5 pg (ref 26.6–33.0)
MCHC: 32.3 g/dL (ref 31.5–35.7)
MCV: 92 fL (ref 79–97)
Platelets: 245 10*3/uL (ref 150–450)
RBC: 5.05 x10E6/uL (ref 4.14–5.80)
RDW: 12.4 % (ref 11.6–15.4)
WBC: 9.4 10*3/uL (ref 3.4–10.8)

## 2022-08-31 ENCOUNTER — Other Ambulatory Visit: Payer: Self-pay | Admitting: Family

## 2022-08-31 DIAGNOSIS — E039 Hypothyroidism, unspecified: Secondary | ICD-10-CM

## 2022-10-03 DIAGNOSIS — E119 Type 2 diabetes mellitus without complications: Secondary | ICD-10-CM | POA: Diagnosis not present

## 2022-10-03 LAB — HM DIABETES EYE EXAM

## 2022-10-09 ENCOUNTER — Encounter: Payer: Self-pay | Admitting: Family

## 2022-10-16 ENCOUNTER — Other Ambulatory Visit: Payer: Self-pay | Admitting: Internal Medicine

## 2022-10-16 DIAGNOSIS — I10 Essential (primary) hypertension: Secondary | ICD-10-CM

## 2023-01-08 ENCOUNTER — Ambulatory Visit
Admission: EM | Admit: 2023-01-08 | Discharge: 2023-01-08 | Disposition: A | Discharge status: Home / Self Care | Payer: Medicare HMO

## 2023-01-08 ENCOUNTER — Encounter (HOSPITAL_COMMUNITY): Payer: Self-pay | Admitting: Emergency Medicine

## 2023-01-08 ENCOUNTER — Emergency Department (HOSPITAL_COMMUNITY)
Admission: EM | Admit: 2023-01-08 | Discharge: 2023-01-08 | Discharge status: Against Medical Advice | Payer: Medicare HMO | Attending: Emergency Medicine | Admitting: Emergency Medicine

## 2023-01-08 ENCOUNTER — Emergency Department (HOSPITAL_COMMUNITY): Payer: Medicare HMO

## 2023-01-08 ENCOUNTER — Other Ambulatory Visit: Payer: Self-pay

## 2023-01-08 DIAGNOSIS — Z5321 Procedure and treatment not carried out due to patient leaving prior to being seen by health care provider: Secondary | ICD-10-CM | POA: Insufficient documentation

## 2023-01-08 DIAGNOSIS — R051 Acute cough: Secondary | ICD-10-CM

## 2023-01-08 DIAGNOSIS — U071 COVID-19: Secondary | ICD-10-CM | POA: Diagnosis not present

## 2023-01-08 DIAGNOSIS — R0902 Hypoxemia: Secondary | ICD-10-CM | POA: Diagnosis not present

## 2023-01-08 DIAGNOSIS — Z20822 Contact with and (suspected) exposure to covid-19: Secondary | ICD-10-CM

## 2023-01-08 DIAGNOSIS — R7981 Abnormal blood-gas level: Secondary | ICD-10-CM

## 2023-01-08 DIAGNOSIS — R059 Cough, unspecified: Secondary | ICD-10-CM | POA: Diagnosis present

## 2023-01-08 DIAGNOSIS — J929 Pleural plaque without asbestos: Secondary | ICD-10-CM | POA: Diagnosis not present

## 2023-01-08 LAB — SARS CORONAVIRUS 2 BY RT PCR: SARS Coronavirus 2 by RT PCR: POSITIVE — AB

## 2023-01-08 NOTE — ED Notes (Signed)
Pt did not want blood done.

## 2023-01-08 NOTE — ED Notes (Signed)
Patient is being discharged from the Urgent Care and sent to the Emergency Department via POV . Per Ervin Knack, FNP, patient is in need of higher level of care due to exposure to Covid and low O2 sats. Patient is aware and verbalizes understanding of plan of care.  Vitals:   01/08/23 1455  BP: (!) 140/63  Pulse: 64  Resp: 16  Temp: 98.2 F (36.8 C)  SpO2: 93%

## 2023-01-08 NOTE — ED Provider Notes (Signed)
[facility]    CSN: [id number] Arrival date & time: 01/08/23  1420      History   Chief Complaint Chief Complaint  Patient presents with   Covid Exposure    HPI Robert Ramirez is a 77 y.o. male.   Patient presents for further evaluation of approximately 1 week history of coughing and upper respiratory symptoms.  Denies any obvious fever.  Wife currently has similar symptoms and his son recently tested positive for COVID-19.  Patient denies history of asthma or COPD but does report history of respiratory failure in the past.  He denies any smoke cigarettes.  Reports that he has shortness of breath at baseline which is unchanged since symptoms started.     Past Medical History:  Diagnosis Date   Diabetes mellitus    High cholesterol    Hypertension     Patient Active Problem List   Diagnosis Date Noted   Poorly-controlled hypertension 04/05/2019   Conductive hearing loss 04/05/2019   Chewing tobacco use 04/05/2019   Bilateral impacted cerumen 04/05/2019   Primary hyperparathyroidism (HCC) 11/21/2018   Chronic respiratory failure with hypoxia and hypercapnia (HCC) 11/18/2018   Chronic obstructive asthma (with obstructive pulmonary disease) 06/09/2018   Other secondary pulmonary hypertension (HCC) 06/09/2018   Healthcare maintenance 03/30/2018   Serum total bilirubin elevated 03/26/2018   Asbestos exposure 03/26/2018   Lesion of skin of scalp 03/26/2018   Coronary artery disease involving native coronary artery of native heart without angina pectoris 03/26/2018   Acquired hypothyroidism 03/26/2018   Pleural plaque 03/14/2018   Diabetes (HCC) 03/14/2018   Somnolence    Hyponatremia    Abnormal chest x-ray    Hypertension associated with diabetes (HCC) 07/18/2014   Mixed hyperlipidemia 07/18/2014   Diabetic retinopathy associated with type 2 diabetes mellitus (HCC) 07/18/2014    Past Surgical History:  Procedure Laterality Date   APPENDECTOMY      RIGHT/LEFT HEART CATH AND CORONARY ANGIOGRAPHY N/A 03/17/2018   Procedure: RIGHT/LEFT HEART CATH AND CORONARY ANGIOGRAPHY;  Surgeon: Orpah Cobb, MD;  Location: [facility];  Service: Cardiovascular;  Laterality: N/A;       Home Medications    Prior to Admission medications   Medication Sig Start Date End Date Taking? Authorizing Provider  amLODipine (NORVASC) 10 MG tablet TAKE 1 TABLET EVERY DAY 08/04/22  Yes Rema Fendt, NP  aspirin EC 81 MG EC tablet Take 1 tablet (81 mg total) by mouth daily. 03/21/18  Yes Shon Hale, MD  atorvastatin (LIPITOR) 40 MG tablet TAKE 1 TABLET EVERY DAY AT 6PM 10/16/22  Yes Orbie Pyo, MD  atorvastatin (LIPITOR) 80 MG tablet Take 1 tablet (80 mg total) by mouth daily. 05/21/22  Yes Orbie Pyo, MD  blood glucose meter kit and supplies Dispense based on patient and insurance preference. Use up to four times daily as directed. (FOR ICD-10 E10.9, E11.9). 02/08/22  Yes Zonia Kief, Amy J, NP  chlorthalidone (HYGROTON) 25 MG tablet TAKE 1 TABLET EVERY DAY 10/16/22  Yes Orbie Pyo, MD  Cholecalciferol (VITAMIN D-3) 25 MCG (1000 UT) CAPS Take by mouth.   Yes [provider]  Cyanocobalamin 2500 MCG TABS Chew by mouth daily.   Yes [provider]  Insulin NPH, Human,, Isophane, (NOVOLIN N FLEXPEN) 100 UNIT/ML Kiwkpen Inject 33 Units into the skin 2 (two) times daily with a meal. 08/04/22  Yes Zonia Kief, Amy J, NP  Insulin Pen Needle (DROPLET PEN NEEDLES) 31G X 8 MM MISC  1 each by Other route 2 (two) times daily with a meal. 08/04/22  Yes Zonia Kief, Amy J, NP  lisinopril (ZESTRIL) 40 MG tablet TAKE 1 TABLET EVERY DAY 10/16/22  Yes Orbie Pyo, MD  metFORMIN (GLUCOPHAGE) 1000 MG tablet TAKE 1 TABLET TWICE DAILY WITH MEALS 08/04/22  Yes Zonia Kief, Amy J, NP  spironolactone (ALDACTONE) 25 MG tablet TAKE 1 TABLET EVERY DAY 08/04/22  Yes Orbie Pyo, MD  amLODipine (NORVASC) 10 MG tablet Take 1 tablet (10 mg total) by  mouth daily. 08/04/22 11/02/22  Rema Fendt, NP  empagliflozin (JARDIANCE) 10 MG TABS tablet Take 1 tablet (10 mg total) by mouth daily before breakfast. 04/21/22   Orbie Pyo, MD  levothyroxine (SYNTHROID) 50 MCG tablet Take 1 tablet (50 mcg total) by mouth daily. 08/04/22 11/02/22  Rema Fendt, NP  metFORMIN (GLUCOPHAGE) 1000 MG tablet Take 1 tablet (1,000 mg total) by mouth 2 (two) times daily with a meal. 08/04/22 11/02/22  Rema Fendt, NP  nitroGLYCERIN (NITROSTAT) 0.4 MG SL tablet Place 1 tablet (0.4 mg total) under the tongue every 5 (five) minutes as needed for chest pain. 03/29/21   Orbie Pyo, MD    Family History Family History  Problem Relation Age of Onset   Heart failure Mother    Cancer Father    Diabetes Sister    Cancer Sister     Social History Social History   Tobacco Use   Smoking status: Former    Current packs/day: 0.00    Average packs/day: 0.5 packs/day for 37.0 years (18.5 ttl pk-yrs)    Types: Cigarettes    Start date: 03/31[birth year]1943    Quit date: 03/30/1980    Years since quitting: 42.8    Passive exposure: Never   Smokeless tobacco: Current    Types: Chew, Snuff  Vaping Use   Vaping status: Never Used  Substance Use Topics   Alcohol use: No   Drug use: No     Allergies   Patient has no known allergies.   Review of Systems Review of Systems Per HPI  Physical Exam Triage Vital Signs ED Triage Vitals  Encounter Vitals Group     BP 01/08/23 1455 (!) 140/63     Systolic BP Percentile --      Diastolic BP Percentile --      Pulse Rate 01/08/23 1455 64     Resp 01/08/23 1455 16     Temp 01/08/23 1455 98.2 F (36.8 C)     Temp Source 01/08/23 1455 Oral     SpO2 01/08/23 1455 93 %     Weight --      Height --      Head Circumference --      Peak Flow --      Pain Score 01/08/23 1457 0     Pain Loc --      Pain Education --      Exclude from Growth Chart --    No data found.  Updated Vital Signs BP (!) 140/63 (BP  Location: Left Arm)   Pulse 64   Temp 98.2 F (36.8 C) (Oral)   Resp 16   SpO2 93%   Visual Acuity Right Eye Distance:   Left Eye Distance:   Bilateral Distance:    Right Eye Near:   Left Eye Near:    Bilateral Near:     Physical Exam Constitutional:      General: He is not in acute distress.  Appearance: Normal appearance. He is not toxic-appearing or diaphoretic.  HENT:     Head: Normocephalic and atraumatic.     Right Ear: Tympanic membrane and ear canal normal.     Left Ear: Tympanic membrane and ear canal normal.     Nose: No congestion.     Mouth/Throat:     Mouth: Mucous membranes are moist.     Pharynx: No posterior oropharyngeal erythema.  Eyes:     Extraocular Movements: Extraocular movements intact.     Conjunctiva/sclera: Conjunctivae normal.     Pupils: Pupils are equal, round, and reactive to light.  Cardiovascular:     Rate and Rhythm: Normal rate and regular rhythm.     Pulses: Normal pulses.     Heart sounds: Normal heart sounds.  Pulmonary:     Effort: Pulmonary effort is normal. No respiratory distress.     Breath sounds: No stridor. No wheezing, rhonchi or rales.     Comments: Diminished lung sounds bilaterally.  Musculoskeletal:        General: Normal range of motion.     Cervical back: Normal range of motion.  Skin:    General: Skin is warm and dry.  Neurological:     General: No focal deficit present.     Mental Status: He is alert and oriented to person, place, and time. Mental status is at baseline.  Psychiatric:        Mood and Affect: Mood normal.        Behavior: Behavior normal.      UC Treatments / Results  Labs (all labs ordered are listed, but only abnormal results are displayed) Labs Reviewed - No data to display  EKG   Radiology No results found.  Procedures Procedures (including critical care time)  Medications Ordered in UC Medications - No data to display  Initial Impression / Assessment and Plan / UC  Course  I have reviewed the triage vital signs and the nursing notes.  Pertinent labs & imaging results that were available during my care of the patient were reviewed by me and considered in my medical decision making (see chart for details).     Patient's symptoms are most likely related to COVID-19 versus different viral illness but oxygen is ranging from 90 to 93% on physical exam.  Therefore, recommended to patient that he go to the emergency department for further evaluation and management as patient reports his oxygen is typically higher at home.  Patient was agreeable with this plan but declined EMS transport.  Patient left via self transport to go to the ER. Final Clinical Impressions(s) / UC Diagnoses   Final diagnoses:  Low oxygen saturation  Close exposure to COVID-19 virus  Acute cough   Discharge Instructions   None    ED Prescriptions   None    PDMP not reviewed this encounter.   Gustavus Bryant, [place] 01/08/23 1537

## 2023-01-08 NOTE — ED Triage Notes (Signed)
Patient presents due to Covid exposure from his son 4 days ago. For about a week, he has had a cough, sore throat and nasal congestion. He denies pain, nausea and vomiting.

## 2023-01-08 NOTE — ED Triage Notes (Signed)
Pt sone tested positive for Covid 4 days ago. Pt now having cough, sore throat that started a week ago. Taking mucinx. Would like a Covid test.

## 2023-01-08 NOTE — ED Notes (Signed)
Patient walked out after PA attempted to educate them on the importance of them staying.

## 2023-01-09 ENCOUNTER — Ambulatory Visit: Payer: Self-pay | Admitting: *Deleted

## 2023-01-09 NOTE — Telephone Encounter (Signed)
  Chief Complaint: covid positive 01/08/23 in ED. Requesting medication  Symptoms: cough, O 2 sat at 93-94% RA Frequency: 1 week Pertinent Negatives: Patient denies chest pain no difficulty breathing . No fever Disposition: [] ED /[] Urgent Care (no appt availability in office) / [] Appointment(In office/virtual)/ []  [place] [facility]/ [] Home Care/ [x] Refused Recommended Disposition /[] [place] [facility]/ []  Follow-up with PCP Additional Notes:   Went to UC for covid exposure, then sent to ED. Dx with covid positive 01/08/23. Recommended by ED to stay overnight for observation due to low O2 sats. Patient and wife refused. Told to f/u with PCP for medication. No available appt. Recommended to go back to ED and patient and wife refused. Financial concerns. Please advise.     Reason for Disposition  Oxygen level (e.g., pulse oximetry) 91 to 94 percent  Answer Assessment - Initial Assessment Questions 1. COVID-19 DIAGNOSIS: "How do you know that you have COVID?" (e.g., positive lab test or self-test, diagnosed by doctor or NP/PA, symptoms after exposure).     Positive covid test 01/08/23 in ED 2. COVID-19 EXPOSURE: "Was there any known exposure to COVID before the symptoms began?" CDC Definition of close contact: within 6 feet (2 meters) for a total of 15 minutes or more over a 24-hour period.      Patient son  3. ONSET: "When did the COVID-19 symptoms start?"      1 week ago  4. WORST SYMPTOM: "What is your worst symptom?" (e.g., cough, fever, shortness of breath, muscle aches)     Cough  5. COUGH: "Do you have a cough?" If Yes, ask: "How bad is the cough?"       Yes  6. FEVER: "Do you have a fever?" If Yes, ask: "What is your temperature, how was it measured, and when did it start?"     Denies  7. RESPIRATORY STATUS: "Describe your breathing?" (e.g., normal; shortness of breath, wheezing, unable to speak)      No difficulties 8. BETTER-SAME-WORSE: "Are you getting better, staying the  same or getting worse compared to yesterday?"  If getting worse, ask, "In what way?"     na 9. OTHER SYMPTOMS: "Do you have any other symptoms?"  (e.g., chills, fatigue, headache, loss of smell or taste, muscle pain, sore throat)     Cough  10. HIGH RISK DISEASE: "Do you have any chronic medical problems?" (e.g., asthma, heart or lung disease, weak immune system, obesity, etc.)       See hx  11. VACCINE: "Have you had the COVID-19 vaccine?" If Yes, ask: "Which one, how many shots, when did you get it?"       na 12. PREGNANCY: "Is there any chance you are pregnant?" "When was your last menstrual period?"       na 13. O2 SATURATION MONITOR:  "Do you use an oxygen saturation monitor (pulse oximeter) at home?" If Yes, ask "What is your reading (oxygen level) today?" "What is your usual oxygen saturation reading?" (e.g., 95%)        93-94 % RA  Protocols used: Coronavirus (COVID-19) Diagnosed or Suspected-A-AH

## 2023-01-09 NOTE — Telephone Encounter (Signed)
Called patient, name and DOB verified.  Patient states he feels better than he did on yesterday. Denies fever and SOB today. States he is aware that he will go to ED if sx's worsen.   Advised patient that PCP  was not in the office and covering provider would not be back in until Monday or he could call Cardiologist for medication.  Advised if sx worsen to go to the ED. Pt verbalized understanding.      He stated he was OK, resting as  a retiree should, watching TV, and would treat the sx's of his cough. States his wife is ok. She has been talking on the phone all day.

## 2023-01-09 NOTE — Telephone Encounter (Signed)
Summary: Covid+   Patient was seen at Urgent Care and [facility] yesterday and tested positive for Covid. Patient states they were instructed to reach out to their pcp to request medication. Please advise.      Called patient and wife [phone] to review sx of covid . Tested positive and requesting medication. No answer , no VM box set up unable to leave message.

## 2023-01-13 NOTE — Telephone Encounter (Signed)
Report to Emergency Department/call 911 for immediate medical evaluation. Follow-up with Primary Care at discharge.

## 2023-01-14 ENCOUNTER — Telehealth: Payer: Self-pay

## 2023-01-14 NOTE — Telephone Encounter (Signed)
Transition Care Management Unsuccessful Follow-up Telephone Call  Date of discharge and from where:  [facility] 8/15  Attempts:  1st Attempt  Reason for unsuccessful TCM follow-up call:  No answer/busy   Lenard Forth [place]   [facility], [facility] Guide, Phone: [phone] Website: [email or web address]

## 2023-01-15 ENCOUNTER — Telehealth: Payer: Self-pay

## 2023-01-15 NOTE — Telephone Encounter (Signed)
Transition Care Management Unsuccessful Follow-up Telephone Call  Date of discharge and from where:  [facility] 8/15  Attempts:  2nd Attempt  Reason for unsuccessful TCM follow-up call:  No answer/busy   Lenard Forth [place]  [facility], [facility] Guide, Phone: [phone] Website: [email or web address]

## 2023-01-16 ENCOUNTER — Other Ambulatory Visit: Payer: Self-pay | Admitting: Family

## 2023-01-16 NOTE — Telephone Encounter (Signed)
Requested medications are due for refill today.  no  Requested medications are on the active medications list.  yes  Last refill. 04/21/2022  Future visit scheduled.   yes  Notes to clinic.  Labs are expired. Pt is requesting refill.    Requested Prescriptions  Pending Prescriptions Disp Refills   empagliflozin (JARDIANCE) 10 MG TABS tablet 90 tablet 3    Sig: Take 1 tablet (10 mg total) by mouth daily before breakfast.     Endocrinology:  Diabetes - SGLT2 Inhibitors Failed - 01/16/2023 10:12 AM      Failed - Cr in normal range and within 360 days    Creatinine, Ser  Date Value Ref Range Status  12/30/2021 1.34 (H) 0.76 - 1.27 mg/dL Final         Failed - eGFR in normal range and within 360 days    GFR calc Af Amer  Date Value Ref Range Status  03/18/2018 >60 >60 mL/min Final    Comment:    (NOTE) The eGFR has been calculated using the CKD EPI equation. This calculation has not been validated in all clinical situations. eGFR's persistently <60 mL/min signify possible Chronic Kidney Disease.    GFR calc non Af Amer  Date Value Ref Range Status  03/18/2018 >60 >60 mL/min Final   eGFR  Date Value Ref Range Status  12/30/2021 56 (L) >59 mL/min/1.73 Final         Passed - HBA1C is between 0 and 7.9 and within 180 days    Hemoglobin A1C  Date Value Ref Range Status  08/04/2022 7.6 (A) 4.0 - 5.6 % Final   HbA1c, POC (controlled diabetic range)  Date Value Ref Range Status  03/15/2021 8.3 (A) 0.0 - 7.0 % Final   Hgb A1c MFr Bld  Date Value Ref Range Status  01/28/2022 8.2 (H) 4.8 - 5.6 % Final    Comment:             Prediabetes: 5.7 - 6.4          Diabetes: >6.4          Glycemic control for adults with diabetes: <7.0          Passed - Valid encounter within last 6 months    Recent Outpatient Visits           5 months ago Medicare annual wellness visit, subsequent   [place] [facility], Amy J, NP   8 months ago Type 2  diabetes mellitus with retinopathy, with long-term current use of insulin, macular edema presence unspecified, unspecified laterality, unspecified retinopathy severity (HCC)   [place] [facility] at [facility], Amy J, NP   11 months ago Type 2 diabetes mellitus with retinopathy, with long-term current use of insulin, macular edema presence unspecified, unspecified laterality, unspecified retinopathy severity (HCC)   [place] [facility] at [facility], Amy J, NP   1 year ago Type 2 diabetes mellitus with retinopathy, with long-term current use of insulin, macular edema presence unspecified, unspecified laterality, unspecified retinopathy severity (HCC)   [place] [facility] at [facility], Amy J, NP   1 year ago Type 2 diabetes mellitus with retinopathy, with long-term current use of insulin, macular edema presence unspecified, unspecified laterality, unspecified retinopathy severity (HCC)   [place] [facility] at [facility], Salomon Fick, NP       Future Appointments  In 6 months Rema Fendt, NP [facility] at [facility]

## 2023-01-16 NOTE — Telephone Encounter (Signed)
Medication Refill - Medication:  empagliflozin (JARDIANCE) 10 MG TABS tablet Been out for a month now  Has the patient contacted their pharmacy? Yes, advised to contact PCP  Preferred Pharmacy (with phone number or street name):  [facility] [address] [place]), [place] - [id number]  Phone: [phone] Fax: [phone]   Has the patient been seen for an appointment in the last year OR does the patient have an upcoming appointment? Yes.  Last seen 08/04/2022

## 2023-01-20 ENCOUNTER — Other Ambulatory Visit: Payer: Self-pay | Admitting: Family

## 2023-01-20 ENCOUNTER — Telehealth: Payer: Self-pay | Admitting: Family

## 2023-01-20 DIAGNOSIS — E039 Hypothyroidism, unspecified: Secondary | ICD-10-CM

## 2023-01-20 NOTE — Telephone Encounter (Signed)
Medication Refill - Medication: levothyroxine (SYNTHROID) 50 MCG tablet   Has the patient contacted their pharmacy? No.  Preferred Pharmacy (with phone number or street name):  [facility] - [place], [place] - [address] Phone: [phone]  Fax: [phone]     Has the patient been seen for an appointment in the last year OR does the patient have an upcoming appointment? Yes.    Agent: Please be advised that RX refills may take up to 3 business days. We ask that you follow-up with your pharmacy.

## 2023-01-20 NOTE — Telephone Encounter (Signed)
Patient called to f/u on the refill for his empagliflozin (JARDIANCE) 10 MG TABS tablet. Advised it can take up to 72 hours and to give a little more time for physician to sign off on it. Please f/u with patient if he is not able to get the refill.

## 2023-01-21 MED ORDER — LEVOTHYROXINE SODIUM 50 MCG PO TABS: 50.0000 ug | ORAL_TABLET | Freq: Every day | ORAL | 0 refills | Status: DC

## 2023-01-21 NOTE — Telephone Encounter (Signed)
Requested Prescriptions  Pending Prescriptions Disp Refills   levothyroxine (SYNTHROID) 50 MCG tablet 90 tablet 0    Sig: Take 1 tablet (50 mcg total) by mouth daily.     Endocrinology:  Hypothyroid Agents Failed - 01/20/2023 11:34 AM      Failed - TSH in normal range and within 360 days    TSH  Date Value Ref Range Status  12/30/2021 2.960 0.450 - 4.500 uIU/mL Final         Passed - Valid encounter within last 12 months    Recent Outpatient Visits           5 months ago Medicare annual wellness visit, subsequent   [place] [facility] at [facility], Amy J, NP   8 months ago Type 2 diabetes mellitus with retinopathy, with long-term current use of insulin, macular edema presence unspecified, unspecified laterality, unspecified retinopathy severity (HCC)   [place] [facility] at [facility], Amy J, NP   11 months ago Type 2 diabetes mellitus with retinopathy, with long-term current use of insulin, macular edema presence unspecified, unspecified laterality, unspecified retinopathy severity (HCC)   [place] [facility] at [facility], Amy J, NP   1 year ago Type 2 diabetes mellitus with retinopathy, with long-term current use of insulin, macular edema presence unspecified, unspecified laterality, unspecified retinopathy severity (HCC)   [place] [facility] at [facility], Amy J, NP   1 year ago Type 2 diabetes mellitus with retinopathy, with long-term current use of insulin, macular edema presence unspecified, unspecified laterality, unspecified retinopathy severity (HCC)   [place] [facility] at [facility], [place], NP       Future Appointments             In 6 months Rema Fendt, NP [facility] at [facility]

## 2023-01-30 NOTE — Telephone Encounter (Signed)
Pls advise.  Rx prescribed by external prescriber (Cardiology).

## 2023-02-02 NOTE — Telephone Encounter (Signed)
Jardiance managed by Cardiology. Please request refills from the same.

## 2023-02-02 NOTE — Telephone Encounter (Signed)
Called patient reached voicemail, and mailbox is not set up.

## 2023-03-12 ENCOUNTER — Other Ambulatory Visit: Payer: Self-pay | Admitting: Internal Medicine

## 2023-03-24 ENCOUNTER — Encounter: Payer: Self-pay | Admitting: Internal Medicine

## 2023-04-04 ENCOUNTER — Other Ambulatory Visit: Payer: Self-pay | Admitting: Family

## 2023-04-04 DIAGNOSIS — E039 Hypothyroidism, unspecified: Secondary | ICD-10-CM

## 2023-04-28 ENCOUNTER — Ambulatory Visit: Payer: Self-pay

## 2023-04-28 NOTE — Telephone Encounter (Signed)
Chief Complaint: Medication refill  Symptoms: mild Leg and feet swelling   Disposition: [] ED /[] Urgent Care (no appt availability in office) / [] Appointment(In office/virtual)/ []  [place] [facility]/ [x] Home Care/ [] Refused Recommended Disposition /[] [place] [facility]/ []  Follow-up with PCP Additional Notes: Patient states he is trying to get a refill on his Empaglifozin Rx because he has not had any for about 3-4 months now. Patient stated he has mild swelling in the legs and feet right now but he just needs a medication refill. Care advice given and patient was advised the patient usually gets the medication filled by his Cardiologist office. Patient stated he called over there to request the refill and is awaiting a call back now. Advised patient to call the office if he has a hard time getting the medication refilled. Patient verbalized understanding.   Summary: swelling in feet and legs   Pt stated he is experiencing swelling in feet and legs. Possibly because he has not had his medication empagliflozin (JARDIANCE) in the past 3-4 months.  I explained to the patient that medication refill should be requested from Cardiology.  Per pt request, transferred to Cardiology for refills.  Seeking clinical advice.     Reason for Disposition  [1] Caller has medicine question about med NOT prescribed by PCP AND [2] triager unable to answer question (e.g., compatibility with other med, storage)  Answer Assessment - Initial Assessment Questions 1. NAME of MEDICINE: "What medicine(s) are you calling about?"     empagliflozin (JARDIANCE)  2. QUESTION: "What is your question?" (e.g., double dose of medicine, side effect)     I need a refill on my medication I've been out for 3-4 months. 3. PRESCRIBER: "Who prescribed the medicine?" Reason: if prescribed by specialist, call should be referred to that group.     Cardiologist  4. SYMPTOMS: "Do you have any symptoms?" If Yes, ask: "What symptoms  are you having?"  "How bad are the symptoms (e.g., mild, moderate, severe)    Mild  Feet and leg swelling  Protocols used: Medication Question Call-A-AH

## 2023-05-06 ENCOUNTER — Telehealth: Payer: Self-pay | Admitting: Internal Medicine

## 2023-05-06 ENCOUNTER — Other Ambulatory Visit: Payer: Self-pay

## 2023-05-06 MED ORDER — EMPAGLIFLOZIN 10 MG PO TABS: 10.0000 mg | ORAL_TABLET | Freq: Every day | ORAL | 0 refills | Status: DC

## 2023-05-06 NOTE — Telephone Encounter (Signed)
*  STAT* If patient is at the pharmacy, call can be transferred to refill team.   1. Which medications need to be refilled? (please list name of each medication and dose if known) empagliflozin (JARDIANCE) 10 MG TABS tablet   2. Which pharmacy/location (including street and city if local pharmacy) is medication to be sent to? [id number] - [place]  - [address]    3. Do they need a 30 day or 90 day supply? 90 day

## 2023-05-06 NOTE — Progress Notes (Unsigned)
Cardiology Office Note:   Date:  05/07/2023  ID:  Robert Ramirez, DOB 1946/01/10, MRN [id number] PCP:  Rema Fendt, NP  [facility] Providers Cardiologist:  Alverda Skeans, MD Referring MD: Rema Fendt, NP  Chief Complaint/Reason for Referral: Cardiology follow-up ASSESSMENT:    1. Coronary artery disease involving native coronary artery of native heart without angina pectoris   2. Type 2 diabetes mellitus with retinopathy, with long-term current use of insulin, macular edema presence unspecified, unspecified laterality, unspecified retinopathy severity (HCC)   3. Hypertension associated with diabetes (HCC)   4. Hyperlipidemia associated with type 2 diabetes mellitus (HCC)   5. Aortic atherosclerosis (HCC)   6. Bifascicular block   7. Stage 3a chronic kidney disease (HCC)   8. BMI 25.0-25.9,adult   9. Paroxysmal atrial fibrillation (HCC)     PLAN:   In order of problems listed above: Coronary artery disease: Continue aspirin, atorvastatin, and strict blood pressure control. Diabetes mellitus: Continue aspirin, statin, lisinopril, and restart Jardiance. Hypertension: Blood pressure well-controlled today. Hyperlipidemia: Check lipid panel and LFTs today.  Goal LDL is less than 70. Aortic atherosclerosis: Continue aspirin, statin, and atorvastatin. Bifascicular block: Now with AF.  Monitor for now; no symptoms of presyncope. CKD stage IIIa: Restart Jardiance and continue lisinopril for renal protection. Elevated BMI: Will refer to pharmacy for recommendation current regarding GLP-1 receptor agonist therapy to reduce patient's risk of heart infarction given his history of diabetes. PAF:  New though slow rate, seen on EKG today, present on exam, and reports palpitations; has elevated CV2 score, start Eliquis 5mg  bid, d/c ASA.  Obtain TTE.  Refer to atrial fibrillation clinic.            Dispo:  Return in about 6 months (around 11/05/2023).      Medication  Adjustments/Labs and Tests Ordered: Current medicines are reviewed at length with the patient today.  Concerns regarding medicines are outlined above.  The following changes have been made:     Labs/tests ordered: Orders Placed This Encounter  Procedures   Lipid Profile   Hepatic function panel   AMB Referral to [facility] Pharm-D   Amb Referral to AFIB Clinic   EKG 12-Lead   ECHOCARDIOGRAM COMPLETE    Medication Changes: Meds ordered this encounter  Medications   apixaban (ELIQUIS) 5 MG TABS tablet    Sig: Take 1 tablet (5 mg total) by mouth 2 (two) times daily.    Dispense:  180 tablet    Refill:  3    Current medicines are reviewed at length with the patient today.  The patient does not have concerns regarding medicines.  I spent 33 minutes reviewing all clinical data during and prior to this visit including all relevant imaging studies, laboratories, clinical information from other health systems and prior notes from both Cardiology and other specialties, interviewing the patient, conducting a complete physical examination, and coordinating care in order to formulate a comprehensive and personalized evaluation and treatment plan.   History of Present Illness:      FOCUSED PROBLEM LIST:   Coronary artery disease Mild; cath 2019 Hypertension Hyperlipidemia LP(a) 34 Aortic atherosclerosis Type 2 diabetes mellitus On insulin BMI 25 CKD stage IIIa Bifascicular block: 1AVB + RBBB Obstructive sleep apnea On BiPAP PAF CV 2 score 29 March 2021: The patient was seen for elevated blood pressure.  He was started on Bystolic.  Nitroglycerin as well as [place] Pepper was also started.  He was seen by the pharmacy division and spironolactone  was added.   May 2023: The patient is doing very well.  He has noticed that his spironolactone and Jardiance have resulted in less peripheral edema.  His blood pressure is well controlled today.  He does get somewhat lightheaded when he goes  from sitting to standing quickly.  He does not have presyncope or syncope otherwise.  Plan: Check LP(a).   11/23: The patient's LP(a) was low.  The patient was seen by his primary care provider in September and his blood pressure was fairly well controlled.  Patient's Jardiance was increased to 25 mg but he could not afford this so he stopped it entirely.  In terms of his breathing he feels very well.  He denies any peripheral edema, paroxysmal dyspnea, orthopnea, signs or symptoms of stroke, exertional angina, or severe bleeding.  He is required no emergency room visits or hospitalizations.  He feels very well.  Plan: Restart Jardiance; check lipid panel and LFTs.  12/24: The patient returns for routine follow-up.  In the interim he was seen in the emergency department over the summer due to possible COVID infection.  The patient is doing fairly well.  He noticed increasing peripheral edema when his [place] Pepper was stopped by his PCP.  He would like to restart this medication.  He denies any chest pain.  He has noticed occasional palpitations but has had no presyncope or syncope.  Has had no severe bleeding or bruising.  He denies any signs or symptoms of stroke.  He is relatively well and without complaints today.  Of note he has been sporadically taking his aspirin.          Current Medications: Current Meds  Medication Sig   amLODipine (NORVASC) 10 MG tablet Take 1 tablet (10 mg total) by mouth daily.   apixaban (ELIQUIS) 5 MG TABS tablet Take 1 tablet (5 mg total) by mouth 2 (two) times daily.   atorvastatin (LIPITOR) 80 MG tablet Take 1 tablet (80 mg total) by mouth daily.   blood glucose meter kit and supplies Dispense based on patient and insurance preference. Use up to four times daily as directed. (FOR ICD-10 E10.9, E11.9).   chlorthalidone (HYGROTON) 25 MG tablet TAKE 1 TABLET EVERY DAY   Cholecalciferol (VITAMIN D-3) 25 MCG (1000 UT) CAPS Take by mouth.   Cyanocobalamin 2500 MCG TABS  Chew by mouth daily.   empagliflozin (JARDIANCE) 10 MG TABS tablet Take 1 tablet (10 mg total) by mouth daily before breakfast.   Insulin NPH, Human,, Isophane, (NOVOLIN N FLEXPEN) 100 UNIT/ML Kiwkpen Inject 33 Units into the skin 2 (two) times daily with a meal.   Insulin Pen Needle (DROPLET PEN NEEDLES) 31G X 8 MM MISC 1 each by Other route 2 (two) times daily with a meal.   lisinopril (ZESTRIL) 40 MG tablet TAKE 1 TABLET EVERY DAY   metFORMIN (GLUCOPHAGE) 1000 MG tablet Take 1 tablet (1,000 mg total) by mouth 2 (two) times daily with a meal.   nitroGLYCERIN (NITROSTAT) 0.4 MG SL tablet Place 1 tablet (0.4 mg total) under the tongue every 5 (five) minutes as needed for chest pain.   spironolactone (ALDACTONE) 25 MG tablet TAKE 1 TABLET EVERY DAY   [DISCONTINUED] aspirin EC 81 MG EC tablet Take 1 tablet (81 mg total) by mouth daily.     Review of Systems:   Please see the history of present illness.    All other systems reviewed and are negative.     EKGs/Labs/Other Test Reviewed:   EKG:  EKG Interpretation Date/Time:  Thursday May 07 2023 08:07:53 EST Ventricular Rate:  63 PR Interval:    QRS Duration:  144 QT Interval:  416 QTC Calculation: 425 R Axis:   141  Text Interpretation: Atrial fibrillation Right bundle branch block , plus right ventricular hypertrophy When compared with ECG of 19-Mar-2018 06:19, Atrial fibrillation has replaced Sinus rhythm T wave inversion less evident in Inferior leads T wave amplitude has increased in Lateral leads Confirmed by Alverda Skeans ([id number]) on 05/07/2023 8:15:58 AM         Risk Assessment/Calculations:    CHA2DS2-VASc Score = 4   This indicates a 4.8% annual risk of stroke. The patient's score is based upon: CHF History: 0 HTN History: 1 Diabetes History: 1 Stroke History: 0 Vascular Disease History: 0 Age Score: 2 Gender Score: 0         Physical Exam:   VS:  BP 130/80   Pulse 65   Ht 6' (1.829 m)   Wt 194 lb 9.6  oz (88.3 kg)   SpO2 96%   BMI 26.39 kg/m        Wt Readings from Last 3 Encounters:  05/07/23 194 lb 9.6 oz (88.3 kg)  08/04/22 190 lb (86.2 kg)  04/29/22 190 lb (86.2 kg)      GENERAL:  No apparent distress, AOx3 HEENT:  No carotid bruits, +2 carotid impulses, no scleral icterus CAR:  Irregular RR no murmurs, gallops, rubs, or thrills RES:  Clear to auscultation bilaterally ABD:  Soft, nontender, nondistended, positive bowel sounds x 4 VASC:  +2 radial pulses, +2 carotid pulses NEURO:  CN 2-12 grossly intact; motor and sensory grossly intact PSYCH:  No active depression or anxiety EXT:  No edema, ecchymosis, or cyanosis  Signed, Orbie Pyo, MD  05/07/2023 8:43 AM    [facility] [address] [place], [place], [place]  [address] Phone: [phone]; Fax: [phone]   Note:  This document was prepared using Dragon voice recognition software and may include unintentional dictation errors.

## 2023-05-07 ENCOUNTER — Other Ambulatory Visit: Payer: Self-pay | Admitting: Internal Medicine

## 2023-05-07 ENCOUNTER — Other Ambulatory Visit: Payer: Self-pay

## 2023-05-07 ENCOUNTER — Encounter: Payer: Self-pay | Admitting: Internal Medicine

## 2023-05-07 ENCOUNTER — Ambulatory Visit: Payer: Medicare HMO | Attending: Internal Medicine | Admitting: Internal Medicine

## 2023-05-07 VITALS — BP 130/80 | HR 65 | Ht 72.0 in | Wt 194.6 lb

## 2023-05-07 DIAGNOSIS — E1169 Type 2 diabetes mellitus with other specified complication: Secondary | ICD-10-CM | POA: Diagnosis not present

## 2023-05-07 DIAGNOSIS — E11319 Type 2 diabetes mellitus with unspecified diabetic retinopathy without macular edema: Secondary | ICD-10-CM

## 2023-05-07 DIAGNOSIS — I700 Atherosclerosis of aorta: Secondary | ICD-10-CM | POA: Diagnosis not present

## 2023-05-07 DIAGNOSIS — Z794 Long term (current) use of insulin: Secondary | ICD-10-CM | POA: Diagnosis not present

## 2023-05-07 DIAGNOSIS — Z6825 Body mass index (BMI) 25.0-25.9, adult: Secondary | ICD-10-CM

## 2023-05-07 DIAGNOSIS — I2510 Atherosclerotic heart disease of native coronary artery without angina pectoris: Secondary | ICD-10-CM | POA: Diagnosis not present

## 2023-05-07 DIAGNOSIS — N1831 Chronic kidney disease, stage 3a: Secondary | ICD-10-CM

## 2023-05-07 DIAGNOSIS — I480 Paroxysmal atrial fibrillation: Secondary | ICD-10-CM

## 2023-05-07 DIAGNOSIS — E1159 Type 2 diabetes mellitus with other circulatory complications: Secondary | ICD-10-CM

## 2023-05-07 DIAGNOSIS — I152 Hypertension secondary to endocrine disorders: Secondary | ICD-10-CM | POA: Diagnosis not present

## 2023-05-07 DIAGNOSIS — E785 Hyperlipidemia, unspecified: Secondary | ICD-10-CM | POA: Diagnosis not present

## 2023-05-07 DIAGNOSIS — I452 Bifascicular block: Secondary | ICD-10-CM | POA: Diagnosis not present

## 2023-05-07 LAB — HEPATIC FUNCTION PANEL
ALT: 10 [IU]/L (ref 0–44)
AST: 18 [IU]/L (ref 0–40)
Albumin: 4.4 g/dL (ref 3.8–4.8)
Alkaline Phosphatase: 125 [IU]/L — ABNORMAL HIGH (ref 44–121)
Bilirubin Total: 1.2 mg/dL (ref 0.0–1.2)
Bilirubin, Direct: 0.42 mg/dL — ABNORMAL HIGH (ref 0.00–0.40)
Total Protein: 6.8 g/dL (ref 6.0–8.5)

## 2023-05-07 LAB — LIPID PANEL
Chol/HDL Ratio: 3.1 {ratio} (ref 0.0–5.0)
Cholesterol, Total: 145 mg/dL (ref 100–199)
HDL: 47 mg/dL (ref >39)
LDL Chol Calc (NIH): 82 mg/dL (ref 0–99)
Triglycerides: 84 mg/dL (ref 0–149)
VLDL Cholesterol Cal: 16 mg/dL (ref 5–40)

## 2023-05-07 MED ORDER — APIXABAN 5 MG PO TABS: 5.0000 mg | ORAL_TABLET | Freq: Two times a day (BID) | ORAL | 3 refills | Status: DC

## 2023-05-07 MED ORDER — EMPAGLIFLOZIN 10 MG PO TABS: 10.0000 mg | ORAL_TABLET | Freq: Every day | ORAL | Status: DC

## 2023-05-07 MED ORDER — APIXABAN 5 MG PO TABS: 5.0000 mg | ORAL_TABLET | Freq: Two times a day (BID) | ORAL | Status: DC

## 2023-05-07 MED ORDER — EMPAGLIFLOZIN 10 MG PO TABS: 10.0000 mg | ORAL_TABLET | Freq: Every day | ORAL | 3 refills | Status: DC

## 2023-05-07 NOTE — Patient Instructions (Signed)
Medication Instructions:  STOP ASPIRIN  TAKE THE JARDIANCE 10MG  A DAY  START ELIQUIS 5 MG TWICE A DAY   *If you need a refill on your cardiac medications before your next appointment, please call your pharmacy*   Lab Work: LIPID AND LIVER  If you have labs (blood work) drawn today and your tests are completely normal, you will receive your results only by: MyChart Message (if you have MyChart) OR A paper copy in the mail If you have any lab test that is abnormal or we need to change your treatment, we will call you to review the results.   Testing/Procedures: Your physician has requested that you have an echocardiogram. Echocardiography is a painless test that uses sound waves to create images of your heart. It provides your doctor with information about the size and shape of your heart and how well your heart's chambers and valves are working. This procedure takes approximately one hour. There are no restrictions for this procedure. Please do NOT wear cologne, perfume, aftershave, or lotions (deodorant is allowed). Please arrive 15 minutes prior to your appointment time.  Please note: We ask at that you not bring children with you during ultrasound (echo/ vascular) testing. Due to room size and safety concerns, children are not allowed in the ultrasound rooms during exams. Our front office staff cannot provide observation of children in our lobby area while testing is being conducted. An adult accompanying a patient to their appointment will only be allowed in the ultrasound room at the discretion of the ultrasound technician under special circumstances. We apologize for any inconvenience.    Follow-Up: At [facility], you and your health needs are our priority.  As part of our continuing mission to provide you with exceptional heart care, we have created designated Provider Care Teams.  These Care Teams include your primary Cardiologist (physician) and Advanced Practice  Providers (APPs -  Physician Assistants and Nurse Practitioners) who all work together to provide you with the care you need, when you need it.  We recommend signing up for the patient portal called "MyChart".  Sign up information is provided on this After Visit Summary.  MyChart is used to connect with patients for Virtual Visits (Telemedicine).  Patients are able to view lab/test results, encounter notes, upcoming appointments, etc.  Non-urgent messages can be sent to your provider as well.   To learn more about what you can do with MyChart, go to [email or web address].    Your next appointment:   6 month(s)  Provider: APP      Other Instructions REFERRAL TO THE PHARM D REFERRAL TO THE AFIB CLINIC

## 2023-05-12 ENCOUNTER — Ambulatory Visit (HOSPITAL_COMMUNITY): Payer: Medicare HMO | Admitting: Physician Assistant

## 2023-05-25 ENCOUNTER — Other Ambulatory Visit: Payer: Self-pay | Admitting: Family

## 2023-05-25 DIAGNOSIS — E1165 Type 2 diabetes mellitus with hyperglycemia: Secondary | ICD-10-CM

## 2023-05-25 DIAGNOSIS — I10 Essential (primary) hypertension: Secondary | ICD-10-CM

## 2023-05-28 ENCOUNTER — Ambulatory Visit (HOSPITAL_COMMUNITY)
Admission: RE | Admit: 2023-05-28 | Discharge: 2023-05-28 | Disposition: A | Discharge status: Home / Self Care | Payer: Medicare Other | Source: Ambulatory Visit | Attending: Physician Assistant | Admitting: Physician Assistant

## 2023-05-28 VITALS — BP 118/62 | HR 54 | Ht 72.0 in | Wt 182.6 lb

## 2023-05-28 DIAGNOSIS — E119 Type 2 diabetes mellitus without complications: Secondary | ICD-10-CM | POA: Insufficient documentation

## 2023-05-28 DIAGNOSIS — I2510 Atherosclerotic heart disease of native coronary artery without angina pectoris: Secondary | ICD-10-CM | POA: Insufficient documentation

## 2023-05-28 DIAGNOSIS — I110 Hypertensive heart disease with heart failure: Secondary | ICD-10-CM | POA: Insufficient documentation

## 2023-05-28 DIAGNOSIS — I4891 Unspecified atrial fibrillation: Secondary | ICD-10-CM | POA: Diagnosis not present

## 2023-05-28 DIAGNOSIS — I509 Heart failure, unspecified: Secondary | ICD-10-CM | POA: Insufficient documentation

## 2023-05-28 DIAGNOSIS — E785 Hyperlipidemia, unspecified: Secondary | ICD-10-CM | POA: Diagnosis not present

## 2023-05-28 DIAGNOSIS — I442 Atrioventricular block, complete: Secondary | ICD-10-CM | POA: Diagnosis not present

## 2023-05-28 NOTE — Progress Notes (Signed)
 Primary Care Physician: Lorren Greig PARAS, NP Primary Cardiologist: Lurena MARLA Red, MD Electrophysiologist: None  Referring Physician: Dr Red Dalton Hargrove is a 78 y.o. male with a history of CAD, DM, HTN, HLD, aortic atherosclerosis, CKD who presents for follow up in the [facility].  The patient was initially diagnosed with atrial fibrillation 05/07/23 after presenting to Dr Parry office for routine follow up. Patient did report having occasional palpitations. Patient was started on Eliquis  for a CHADS2VASC score of 5. On review of of the ECG, it appears he is in complete heart block.   On follow up today, patient reports that he feels the same as always. He denies any presyncope or syncope. He has chronic dizziness which has been present for several years and is unchanged.   Today, he denies symptoms of palpitations, chest pain, shortness of breath, orthopnea, PND, lower extremity edema, dizziness, presyncope, syncope, snoring, daytime somnolence, bleeding, or neurologic sequela. The patient is tolerating medications without difficulties and is otherwise without complaint today.    ROS- All systems are reviewed and negative except as per the HPI above.  Past Medical History:  Diagnosis Date   Diabetes mellitus    High cholesterol    Hypertension     Current Outpatient Medications  Medication Sig Dispense Refill   amLODipine  (NORVASC ) 10 MG tablet Take 1 tablet (10 mg total) by mouth daily. 30 tablet 2   atorvastatin  (LIPITOR) 80 MG tablet Take 1 tablet (80 mg total) by mouth daily. 90 tablet 3   blood glucose meter kit and supplies Dispense based on patient and insurance preference. Use up to four times daily as directed. (FOR ICD-10 E10.9, E11.9). 1 each 0   chlorthalidone  (HYGROTON ) 25 MG tablet TAKE 1 TABLET EVERY DAY 90 tablet 3   Cholecalciferol (VITAMIN D-3) 25 MCG (1000 UT) CAPS Take 1 capsule by mouth every morning.      CYANOCOBALAMIN  PO Not sure of the dosage- Taking 1 chewable by mouth daily     empagliflozin  (JARDIANCE ) 10 MG TABS tablet Take 1 tablet (10 mg total) by mouth daily before breakfast.     empagliflozin  (JARDIANCE ) 10 MG TABS tablet Take 1 tablet (10 mg total) by mouth daily before breakfast. 90 tablet 3   Insulin  NPH, Human,, Isophane, (NOVOLIN  N FLEXPEN) 100 UNIT/ML Kiwkpen Inject 33 Units into the skin 2 (two) times daily with a meal. 15 mL 3   Insulin  Pen Needle (DROPLET PEN NEEDLES) 31G X 8 MM MISC 1 each by Other route 2 (two) times daily with a meal. 200 each 0   levothyroxine  (SYNTHROID ) 50 MCG tablet Take 1 tablet (50 mcg total) by mouth daily. 90 tablet 0   lisinopril  (ZESTRIL ) 40 MG tablet TAKE 1 TABLET EVERY DAY 90 tablet 3   metFORMIN  (GLUCOPHAGE ) 1000 MG tablet Take 1 tablet (1,000 mg total) by mouth 2 (two) times daily with a meal. 60 tablet 2   nitroGLYCERIN  (NITROSTAT ) 0.4 MG SL tablet Place 1 tablet (0.4 mg total) under the tongue every 5 (five) minutes as needed for chest pain. 25 tablet 3   spironolactone  (ALDACTONE ) 25 MG tablet TAKE 1 TABLET EVERY DAY 90 tablet 3   No current facility-administered medications for this encounter.    Physical Exam: BP 118/62   Pulse (!) 54   Ht 6' (1.829 m)   Wt 82.8 kg   BMI 24.77 kg/m   GEN: Well nourished, well developed in no acute distress NECK:  No JVD; No carotid bruits CARDIAC: Regular rate and rhythm with occasional ectopy, no murmurs, rubs, gallops RESPIRATORY:  Clear to auscultation without rales, wheezing or rhonchi  ABDOMEN: Soft, non-tender, non-distended EXTREMITIES:  No edema; No deformity   Wt Readings from Last 3 Encounters:  05/28/23 82.8 kg  05/07/23 88.3 kg  08/04/22 86.2 kg     EKG today demonstrates  CHB with occasional junctional beats Vent. rate 54 BPM PR interval * ms QRS duration 146 ms QT/QTcB 408/386 ms   Echo 03/13/18 demonstrated  - Left ventricle: The cavity size was normal. Wall thickness  was    normal. Systolic function was normal. The estimated ejection    fraction was in the range of 60% to 65%. Wall motion was normal;    there were no regional wall motion abnormalities. Doppler    parameters are consistent with abnormal left ventricular    relaxation (grade 1 diastolic dysfunction).  - Ventricular septum: D-shaped interventricular septum suggestive    of RV pressure/volume overload.  - Aortic valve: There was no stenosis.  - Aorta: Mildly dilated aortic root. Aortic root dimension: 41 mm    (ED).  - Mitral valve: Mildly calcified annulus. There was no significant    regurgitation.  - Right ventricle: The cavity size was mildly dilated. Systolic    function was mildly reduced.  - Right atrium: The atrium was mildly dilated.  - Pulmonary arteries: No complete TR doppler jet so unable to    estimate PA systolic pressure.  - Pericardium, extracardiac: A trivial pericardial effusion was    identified.   Impressions:   - Normal LV size with EF 60-65%. D-shaped interventricular septum    suggestive of RV pressure/volume overload. Mildly dilated RV with    mildly decreased systolic function. No significant valvular    abnormalities. Consider PE in differential.     CHA2DS2-VASc Score = 5  The patient's score is based upon: CHF History: 0 HTN History: 1 Diabetes History: 1 Stroke History: 0 Vascular Disease History: 1 (aortic atherosclerosis) Age Score: 2 Gender Score: 0       ASSESSMENT AND PLAN: CHB Appears to have had progression of his baseline conduction disease. ECG from 12/12 and today's ECG reviewed with EP, appear to be CHB. Patient denies any symptoms at this time.  Will refer to EP to discuss PPM Strict ED precautions given if he should have worsening dizziness or presyncope. Repeat echo schedule for 06/15/23 Will discontinue Eliquis   HTN Stable on current regimen   Follow up with EP at next available appointment to establish care/discuss  PPM.       Daril Kicks PA-C [facility] [address] [place], [place] [address] [phone]

## 2023-06-08 NOTE — H&P (View-Only) (Signed)
 Electrophysiology Office Note:   Date:  06/09/2023  ID:  Robert Ramirez, DOB 12-08-1945, MRN [id number]  Primary Cardiologist: Robert MARLA Red, MD Primary Heart Failure: None Electrophysiologist: Robert Kitty, MD      History of Present Illness:   Robert Ramirez is a 78 y.o. male with h/o CAD, DM, HTN, HLD, aortic atherosclerosis, CKD who is being seen today for evaluation of complete heart block at the request of Robert Kicks, PA.  Patient has baseline right bundle branch block, left posterior fascicular block and first-degree AV delay.  Patient saw Dr. Red on 05/07/23 for routine follow-up visit.  At that time EKG was performed which was concerning for atrial fibrillation so he was referred to A-fib clinic.  Scented to A-fib clinic on 05/28/2023 there was an EKG which was concerning for complete heart block.  He was then referred to EP for pacemaker evaluation.  He reports that he is asymptomatic but functional status is somewhat limited at baseline.  He has had dizziness for over 2 years and it does not appear to be worse as of late.  No syncopal episodes.  He has no new or acute complaints today.  Review of systems complete and found to be negative unless listed in HPI.   EP Information / Studies Reviewed:    EKG is ordered today. Personal review as below.  EKG Interpretation Date/Time:  Tuesday June 09 2023 10:57:08 EST Ventricular Rate:  53 PR Interval:    QRS Duration:  152 QT Interval:  410 QTC Calculation: 384 R Axis:   123  Text Interpretation: Sinus rhythm with wenkebach first degree AV delay and intermittent high grade AV block. Right bundle branch block Left posterior fasicular block When compared with ECG of 28-May-2023 09:26,no significant change. Confirmed by Ramirez Robert [phone]) on 06/09/2023 9:27:06 PM   EKG 05/28/23: Sinus rhythm with high-grade AV block.  Right bundle branch block, left posterior fascicular block.  LHC/RHC 03/17/18: Mid RCA to Dist RCA  lesion is 60% stenosed. LV end diastolic pressure is normal.   Medical treatment.   Recommend Aspirin  81mg  daily for moderate CAD.  Nuclear Stress 03/16/18: IMPRESSION: 1. Small region of anteroapical ischemia.  No definite infarct.   2. Mild left ventricular hypokinesis without LVE.   3. Left ventricular ejection fraction 47%   4. Non invasive risk stratification*: Intermediate  Echo 03/13/18: - Left ventricle: The cavity size was normal. Wall thickness was    normal. Systolic function was normal. The estimated ejection    fraction was in the range of 60% to 65%. Wall motion was normal;    there were no regional wall motion abnormalities. Doppler    parameters are consistent with abnormal left ventricular    relaxation (grade 1 diastolic dysfunction).  - Ventricular septum: D-shaped interventricular septum suggestive    of RV pressure/volume overload.  - Aortic valve: There was no stenosis.  - Aorta: Mildly dilated aortic root. Aortic root dimension: 41 mm    (ED).  - Mitral valve: Mildly calcified annulus. There was no significant    regurgitation.  - Right ventricle: The cavity size was mildly dilated. Systolic    function was mildly reduced.  - Right atrium: The atrium was mildly dilated.  - Pulmonary arteries: No complete TR doppler jet so unable to    estimate PA systolic pressure.  - Pericardium, extracardiac: A trivial pericardial effusion was    identified.    Physical Exam:   VS:  BP (!) 138/58   Pulse ROLLEN)  53   Ht 6' (1.829 m)   Wt 183 lb 9.6 oz (83.3 kg)   SpO2 94%   BMI 24.90 kg/m    Wt Readings from Last 3 Encounters:  06/09/23 183 lb 9.6 oz (83.3 kg)  05/28/23 182 lb 9.6 oz (82.8 kg)  05/07/23 194 lb 9.6 oz (88.3 kg)     GEN: Well nourished, well developed in no acute distress NECK: No JVD; No carotid bruits CARDIAC: Bradycardic, irregular. RESPIRATORY:  Clear to auscultation without rales, wheezing or rhonchi  ABDOMEN: Soft, non-tender,  non-distended EXTREMITIES:  No edema; No deformity   ASSESSMENT AND PLAN:   [name] is a 78 y.o. male with h/o CAD, DM, HTN, HLD, aortic atherosclerosis, CKD who is being seen today for evaluation of complete heart block at the request of Robert Kicks, PA.  Patient has extensive conduction disease at baseline.  Previous EKG showed right bundle branch block with left posterior fascicular block and first-degree AV delay.  His EKG from 05/07/2023 was not atrial fibrillation but sinus and either extremely long first-degree AV block or intermittent high-grade AV block.  His EKG from 05/28/2023 demonstrates baseline significant conduction disease.  There is grouped beating suggestive of Wenckebach conduction with extremely long AV intervals.  This is redemonstrated on his EKG today, but also with concern for high-grade AV block.  Patient meets criteria for permanent pacemaker implant based on this.  We discussed going to the ED and having pacemaker implant in the next couple of days. Patient has decided to have this done as outpatient given that his EKGs are relatively unchanged over the past 6 weeks and that he is currently asymptomatic.  We have scheduled for expedited pacemaker implant on this upcoming Friday.  Extensive ED precautions were provided to the patient.  #.  High-grade AV block: #.  Trifascicular block: Patient meets criteria for permanent pacemaker implantation. Explained risks, benefits, and alternatives to pacemaker implantation, including but not limited to lead dislodgment, lead perforation, bleeding, infection, damage to heart or lungs, heart attack, stroke, or death.  Pt verbalized understanding and agrees to proceed. Echocardiogram to be done tomorrow.  And pacemaker implant to be done on Friday. Extensive ED precautions were provided to the patient.  Signed, Robert Kitty, MD

## 2023-06-08 NOTE — Progress Notes (Signed)
 Electrophysiology Office Note:   Date:  06/09/2023  ID:  [name], DOB 12-08-1945, MRN [id number]  Primary Cardiologist: Lurena MARLA Red, MD Primary Heart Failure: None Electrophysiologist: Fonda Kitty, MD      History of Present Illness:   Robert Ramirez is a 78 y.o. male with h/o CAD, DM, HTN, HLD, aortic atherosclerosis, CKD who is being seen today for evaluation of complete heart block at the request of Quita Kicks, PA.  Patient has baseline right bundle branch block, left posterior fascicular block and first-degree AV delay.  Patient saw Dr. Red on 05/07/23 for routine follow-up visit.  At that time EKG was performed which was concerning for atrial fibrillation so he was referred to A-fib clinic.  Scented to A-fib clinic on 05/28/2023 there was an EKG which was concerning for complete heart block.  He was then referred to EP for pacemaker evaluation.  He reports that he is asymptomatic but functional status is somewhat limited at baseline.  He has had dizziness for over 2 years and it does not appear to be worse as of late.  No syncopal episodes.  He has no new or acute complaints today.  Review of systems complete and found to be negative unless listed in HPI.   EP Information / Studies Reviewed:    EKG is ordered today. Personal review as below.  EKG Interpretation Date/Time:  Tuesday June 09 2023 10:57:08 EST Ventricular Rate:  53 PR Interval:    QRS Duration:  152 QT Interval:  410 QTC Calculation: 384 R Axis:   123  Text Interpretation: Sinus rhythm with wenkebach first degree AV delay and intermittent high grade AV block. Right bundle branch block Left posterior fasicular block When compared with ECG of 28-May-2023 09:26,no significant change. Confirmed by Kitty Fonda [phone]) on 06/09/2023 9:27:06 PM   EKG 05/28/23: Sinus rhythm with high-grade AV block.  Right bundle branch block, left posterior fascicular block.  LHC/RHC 03/17/18: Mid RCA to Dist RCA  lesion is 60% stenosed. LV end diastolic pressure is normal.   Medical treatment.   Recommend Aspirin  81mg  daily for moderate CAD.  Nuclear Stress 03/16/18: IMPRESSION: 1. Small region of anteroapical ischemia.  No definite infarct.   2. Mild left ventricular hypokinesis without LVE.   3. Left ventricular ejection fraction 47%   4. Non invasive risk stratification*: Intermediate  Echo 03/13/18: - Left ventricle: The cavity size was normal. Wall thickness was    normal. Systolic function was normal. The estimated ejection    fraction was in the range of 60% to 65%. Wall motion was normal;    there were no regional wall motion abnormalities. Doppler    parameters are consistent with abnormal left ventricular    relaxation (grade 1 diastolic dysfunction).  - Ventricular septum: D-shaped interventricular septum suggestive    of RV pressure/volume overload.  - Aortic valve: There was no stenosis.  - Aorta: Mildly dilated aortic root. Aortic root dimension: 41 mm    (ED).  - Mitral valve: Mildly calcified annulus. There was no significant    regurgitation.  - Right ventricle: The cavity size was mildly dilated. Systolic    function was mildly reduced.  - Right atrium: The atrium was mildly dilated.  - Pulmonary arteries: No complete TR doppler jet so unable to    estimate PA systolic pressure.  - Pericardium, extracardiac: A trivial pericardial effusion was    identified.    Physical Exam:   VS:  BP (!) 138/58   Pulse ROLLEN)  53   Ht 6' (1.829 m)   Wt 183 lb 9.6 oz (83.3 kg)   SpO2 94%   BMI 24.90 kg/m    Wt Readings from Last 3 Encounters:  06/09/23 183 lb 9.6 oz (83.3 kg)  05/28/23 182 lb 9.6 oz (82.8 kg)  05/07/23 194 lb 9.6 oz (88.3 kg)     GEN: Well nourished, well developed in no acute distress NECK: No JVD; No carotid bruits CARDIAC: Bradycardic, irregular. RESPIRATORY:  Clear to auscultation without rales, wheezing or rhonchi  ABDOMEN: Soft, non-tender,  non-distended EXTREMITIES:  No edema; No deformity   ASSESSMENT AND PLAN:   Robert Ramirez is a 78 y.o. male with h/o CAD, DM, HTN, HLD, aortic atherosclerosis, CKD who is being seen today for evaluation of complete heart block at the request of Quita Kicks, PA.  Patient has extensive conduction disease at baseline.  Previous EKG showed right bundle branch block with left posterior fascicular block and first-degree AV delay.  His EKG from 05/07/2023 was not atrial fibrillation but sinus and either extremely long first-degree AV block or intermittent high-grade AV block.  His EKG from 05/28/2023 demonstrates baseline significant conduction disease.  There is grouped beating suggestive of Wenckebach conduction with extremely long AV intervals.  This is redemonstrated on his EKG today, but also with concern for high-grade AV block.  Patient meets criteria for permanent pacemaker implant based on this.  We discussed going to the ED and having pacemaker implant in the next couple of days. Patient has decided to have this done as outpatient given that his EKGs are relatively unchanged over the past 6 weeks and that he is currently asymptomatic.  We have scheduled for expedited pacemaker implant on this upcoming Friday.  Extensive ED precautions were provided to the patient.  #.  High-grade AV block: #.  Trifascicular block: Patient meets criteria for permanent pacemaker implantation. Explained risks, benefits, and alternatives to pacemaker implantation, including but not limited to lead dislodgment, lead perforation, bleeding, infection, damage to heart or lungs, heart attack, stroke, or death.  Pt verbalized understanding and agrees to proceed. Echocardiogram to be done tomorrow.  And pacemaker implant to be done on Friday. Extensive ED precautions were provided to the patient.  Signed, Fonda Kitty, MD

## 2023-06-09 ENCOUNTER — Ambulatory Visit: Payer: Medicare Other | Attending: Cardiology | Admitting: Cardiology

## 2023-06-09 ENCOUNTER — Encounter: Payer: Self-pay | Admitting: Cardiology

## 2023-06-09 ENCOUNTER — Other Ambulatory Visit: Payer: Self-pay

## 2023-06-09 VITALS — BP 138/58 | HR 53 | Ht 72.0 in | Wt 183.6 lb

## 2023-06-09 DIAGNOSIS — I453 Trifascicular block: Secondary | ICD-10-CM

## 2023-06-09 DIAGNOSIS — I442 Atrioventricular block, complete: Secondary | ICD-10-CM | POA: Diagnosis not present

## 2023-06-09 NOTE — Patient Instructions (Signed)
Medication Instructions:  Your physician recommends that you continue on your current medications as directed. Please refer to the Current Medication list given to you today.  *If you need a refill on your cardiac medications before your next appointment, please call your pharmacy*  Lab Work: TODAY: BMET and CBC If you have labs (blood work) drawn today and your tests are completely normal, you will receive your results only by: MyChart Message (if you have MyChart) OR A paper copy in the mail If you have any lab test that is abnormal or we need to change your treatment, we will call you to review the results.  Testing/Procedures: Your physician has recommended that you have a pacemaker inserted. A pacemaker is a small device that is placed under the skin of your chest or abdomen to help control abnormal heart rhythms. This device uses electrical pulses to prompt the heart to beat at a normal rate. Pacemakers are used to treat heart rhythms that are too slow. Wire (leads) are attached to the pacemaker that goes into the chambers of you heart. This is done in the hospital and usually requires and overnight stay. Please see the instruction sheet given to you today for more information.  Follow-Up: At [facility], you and your health needs are our priority.  As part of our continuing mission to provide you with exceptional heart care, we have created designated Provider Care Teams.  These Care Teams include your primary Cardiologist (physician) and Advanced Practice Providers (APPs -  Physician Assistants and Nurse Practitioners) who all work together to provide you with the care you need, when you need it.  Your next appointment:   See instruction letter

## 2023-06-10 ENCOUNTER — Ambulatory Visit: Payer: Medicare Other | Attending: Internal Medicine

## 2023-06-10 DIAGNOSIS — E11319 Type 2 diabetes mellitus with unspecified diabetic retinopathy without macular edema: Secondary | ICD-10-CM

## 2023-06-10 DIAGNOSIS — Z6825 Body mass index (BMI) 25.0-25.9, adult: Secondary | ICD-10-CM

## 2023-06-10 DIAGNOSIS — I152 Hypertension secondary to endocrine disorders: Secondary | ICD-10-CM

## 2023-06-10 DIAGNOSIS — I2510 Atherosclerotic heart disease of native coronary artery without angina pectoris: Secondary | ICD-10-CM

## 2023-06-10 DIAGNOSIS — N1831 Chronic kidney disease, stage 3a: Secondary | ICD-10-CM

## 2023-06-10 DIAGNOSIS — I700 Atherosclerosis of aorta: Secondary | ICD-10-CM

## 2023-06-10 DIAGNOSIS — I452 Bifascicular block: Secondary | ICD-10-CM

## 2023-06-10 DIAGNOSIS — E1169 Type 2 diabetes mellitus with other specified complication: Secondary | ICD-10-CM

## 2023-06-10 DIAGNOSIS — I480 Paroxysmal atrial fibrillation: Secondary | ICD-10-CM | POA: Diagnosis not present

## 2023-06-10 LAB — CBC WITH DIFFERENTIAL/PLATELET
Basophils Absolute: 0 10*3/uL (ref 0.0–0.2)
Basos: 0 %
EOS (ABSOLUTE): 0.2 10*3/uL (ref 0.0–0.4)
Eos: 2 %
Hematocrit: 49.6 % (ref 37.5–51.0)
Hemoglobin: 16 g/dL (ref 13.0–17.7)
Immature Grans (Abs): 0 10*3/uL (ref 0.0–0.1)
Immature Granulocytes: 0 %
Lymphocytes Absolute: 1.7 10*3/uL (ref 0.7–3.1)
Lymphs: 17 %
MCH: 29.5 pg (ref 26.6–33.0)
MCHC: 32.3 g/dL (ref 31.5–35.7)
MCV: 92 fL (ref 79–97)
Monocytes Absolute: 0.8 10*3/uL (ref 0.1–0.9)
Monocytes: 8 %
Neutrophils Absolute: 7.5 10*3/uL — ABNORMAL HIGH (ref 1.4–7.0)
Neutrophils: 73 %
Platelets: 178 10*3/uL (ref 150–450)
RBC: 5.42 x10E6/uL (ref 4.14–5.80)
RDW: 12.2 % (ref 11.6–15.4)
WBC: 10.3 10*3/uL (ref 3.4–10.8)

## 2023-06-10 LAB — ECHOCARDIOGRAM COMPLETE
AV Mean grad: 4 mm[Hg]
AV Peak grad: 7.7 mm[Hg]
Ao pk vel: 1.39 m/s
S' Lateral: 2.8 cm

## 2023-06-10 LAB — BASIC METABOLIC PANEL
BUN/Creatinine Ratio: 18 (ref 10–24)
BUN: 26 mg/dL (ref 8–27)
CO2: 25 mmol/L (ref 20–29)
Calcium: 10.9 mg/dL — ABNORMAL HIGH (ref 8.6–10.2)
Chloride: 96 mmol/L (ref 96–106)
Creatinine, Ser: 1.41 mg/dL — ABNORMAL HIGH (ref 0.76–1.27)
Glucose: 133 mg/dL — ABNORMAL HIGH (ref 70–99)
Potassium: 4.6 mmol/L (ref 3.5–5.2)
Sodium: 140 mmol/L (ref 134–144)
eGFR: 52 mL/min/{1.73_m2} — ABNORMAL LOW (ref >59)

## 2023-06-11 NOTE — Pre-Procedure Instructions (Signed)
Instructed patient on the following items: Arrival time 1000 Nothing to eat or drink after midnight No meds AM of procedure Responsible person to drive you home and stay with you for 24 hrs Wash with special soap night before and morning of procedure  

## 2023-06-12 ENCOUNTER — Encounter (HOSPITAL_COMMUNITY): Payer: Self-pay | Admitting: Cardiology

## 2023-06-12 ENCOUNTER — Encounter (HOSPITAL_COMMUNITY)
Admission: RE | Disposition: A | Discharge status: Home / Self Care | Payer: Self-pay | Source: Home / Self Care | Attending: Cardiology

## 2023-06-12 ENCOUNTER — Other Ambulatory Visit: Payer: Self-pay

## 2023-06-12 ENCOUNTER — Ambulatory Visit (HOSPITAL_COMMUNITY)
Admission: RE | Admit: 2023-06-12 | Discharge: 2023-06-13 | Disposition: A | Discharge status: Home / Self Care | Payer: Medicare Other | Attending: Cardiology | Admitting: Cardiology

## 2023-06-12 DIAGNOSIS — E785 Hyperlipidemia, unspecified: Secondary | ICD-10-CM | POA: Diagnosis not present

## 2023-06-12 DIAGNOSIS — I453 Trifascicular block: Secondary | ICD-10-CM | POA: Diagnosis not present

## 2023-06-12 DIAGNOSIS — I129 Hypertensive chronic kidney disease with stage 1 through stage 4 chronic kidney disease, or unspecified chronic kidney disease: Secondary | ICD-10-CM | POA: Diagnosis not present

## 2023-06-12 DIAGNOSIS — I442 Atrioventricular block, complete: Secondary | ICD-10-CM | POA: Diagnosis present

## 2023-06-12 DIAGNOSIS — I4430 Unspecified atrioventricular block: Secondary | ICD-10-CM | POA: Diagnosis present

## 2023-06-12 DIAGNOSIS — N189 Chronic kidney disease, unspecified: Secondary | ICD-10-CM | POA: Diagnosis not present

## 2023-06-12 DIAGNOSIS — I700 Atherosclerosis of aorta: Secondary | ICD-10-CM | POA: Insufficient documentation

## 2023-06-12 DIAGNOSIS — R001 Bradycardia, unspecified: Secondary | ICD-10-CM | POA: Insufficient documentation

## 2023-06-12 DIAGNOSIS — E1122 Type 2 diabetes mellitus with diabetic chronic kidney disease: Secondary | ICD-10-CM | POA: Diagnosis not present

## 2023-06-12 DIAGNOSIS — I2510 Atherosclerotic heart disease of native coronary artery without angina pectoris: Secondary | ICD-10-CM | POA: Diagnosis not present

## 2023-06-12 HISTORY — PX: PACEMAKER IMPLANT: EP1218

## 2023-06-12 LAB — HEMOGLOBIN A1C
Hgb A1c MFr Bld: 7.5 % — ABNORMAL HIGH (ref 4.8–5.6)
Mean Plasma Glucose: 168.55 mg/dL

## 2023-06-12 LAB — GLUCOSE, CAPILLARY
Glucose-Capillary: 194 mg/dL — ABNORMAL HIGH (ref 70–99)
Glucose-Capillary: 157 mg/dL — ABNORMAL HIGH (ref 70–99)
Glucose-Capillary: 175 mg/dL — ABNORMAL HIGH (ref 70–99)
Glucose-Capillary: 154 mg/dL — ABNORMAL HIGH (ref 70–99)

## 2023-06-12 SURGERY — PACEMAKER IMPLANT

## 2023-06-12 MED ORDER — HEPARIN (PORCINE) IN NACL 1000-0.9 UT/500ML-% IV SOLN
INTRAVENOUS | Status: DC | PRN
  Administered 2023-06-12: 500 mL

## 2023-06-12 MED ORDER — INSULIN NPH (HUMAN) (ISOPHANE) 100 UNIT/ML ~~LOC~~ SUSP
15.0000 [IU] | Freq: Two times a day (BID) | SUBCUTANEOUS | Status: DC
  Administered 2023-06-12 – 2023-06-13 (×2): 15 [IU] via SUBCUTANEOUS
  Filled 2023-06-12: qty 10

## 2023-06-12 MED ORDER — CEFAZOLIN SODIUM-DEXTROSE 2-4 GM/100ML-% IV SOLN
INTRAVENOUS | Status: AC
  Administered 2023-06-12: 2 g via INTRAVENOUS
  Filled 2023-06-12: qty 100

## 2023-06-12 MED ORDER — ACETAMINOPHEN 325 MG PO TABS: 325.0000 mg | ORAL_TABLET | ORAL | Status: DC | PRN

## 2023-06-12 MED ORDER — LIDOCAINE HCL (PF) 1 % IJ SOLN
INTRAMUSCULAR | Status: AC
  Filled 2023-06-12: qty 60

## 2023-06-12 MED ORDER — ONDANSETRON HCL 4 MG/2ML IJ SOLN: 4.0000 mg | Freq: Four times a day (QID) | INTRAMUSCULAR | Status: DC | PRN

## 2023-06-12 MED ORDER — LEVOTHYROXINE SODIUM 50 MCG PO TABS
50.0000 ug | ORAL_TABLET | Freq: Every day | ORAL | Status: DC
  Administered 2023-06-13: 50 ug via ORAL
  Filled 2023-06-12: qty 1

## 2023-06-12 MED ORDER — LISINOPRIL 20 MG PO TABS
40.0000 mg | ORAL_TABLET | Freq: Every day | ORAL | Status: DC
  Administered 2023-06-12 – 2023-06-13 (×2): 40 mg via ORAL
  Filled 2023-06-12 (×2): qty 2

## 2023-06-12 MED ORDER — ATORVASTATIN CALCIUM 40 MG PO TABS
40.0000 mg | ORAL_TABLET | Freq: Every day | ORAL | Status: DC
  Administered 2023-06-12 – 2023-06-13 (×2): 40 mg via ORAL
  Filled 2023-06-12 (×2): qty 1

## 2023-06-12 MED ORDER — CYANOCOBALAMIN 500 MCG PO TABS
500.0000 ug | ORAL_TABLET | Freq: Every day | ORAL | Status: DC
  Administered 2023-06-12 – 2023-06-13 (×2): 500 ug via ORAL
  Filled 2023-06-12 (×2): qty 1

## 2023-06-12 MED ORDER — CHLORHEXIDINE GLUCONATE 4 % EX SOLN
4.0000 | Freq: Once | CUTANEOUS | Status: AC
  Administered 2023-06-12: 4 via TOPICAL

## 2023-06-12 MED ORDER — CEFAZOLIN SODIUM-DEXTROSE 2-4 GM/100ML-% IV SOLN: 2.0000 g | INTRAVENOUS | Status: AC

## 2023-06-12 MED ORDER — CEFAZOLIN SODIUM-DEXTROSE 1-4 GM/50ML-% IV SOLN
1.0000 g | Freq: Four times a day (QID) | INTRAVENOUS | Status: AC
  Administered 2023-06-12 – 2023-06-13 (×3): 1 g via INTRAVENOUS
  Filled 2023-06-12 (×3): qty 50

## 2023-06-12 MED ORDER — MIDAZOLAM HCL 5 MG/5ML IJ SOLN
INTRAMUSCULAR | Status: AC
  Filled 2023-06-12: qty 5

## 2023-06-12 MED ORDER — EMPAGLIFLOZIN 10 MG PO TABS
10.0000 mg | ORAL_TABLET | Freq: Every day | ORAL | Status: DC
  Administered 2023-06-13: 10 mg via ORAL
  Filled 2023-06-12: qty 1

## 2023-06-12 MED ORDER — METFORMIN HCL 500 MG PO TABS
1000.0000 mg | ORAL_TABLET | Freq: Two times a day (BID) | ORAL | Status: DC
  Administered 2023-06-12 – 2023-06-13 (×2): 1000 mg via ORAL
  Filled 2023-06-12 (×2): qty 2

## 2023-06-12 MED ORDER — SODIUM CHLORIDE 0.9 % IV SOLN: INTRAVENOUS | Status: DC

## 2023-06-12 MED ORDER — POVIDONE-IODINE 10 % EX SWAB
2.0000 | Freq: Once | CUTANEOUS | Status: AC
  Administered 2023-06-12: 2 via TOPICAL

## 2023-06-12 MED ORDER — INSULIN ASPART 100 UNIT/ML IJ SOLN
4.0000 [IU] | Freq: Three times a day (TID) | INTRAMUSCULAR | Status: DC
  Administered 2023-06-12 – 2023-06-13 (×2): 4 [IU] via SUBCUTANEOUS

## 2023-06-12 MED ORDER — LIDOCAINE HCL (PF) 1 % IJ SOLN
INTRAMUSCULAR | Status: DC | PRN
  Administered 2023-06-12: 50 mL

## 2023-06-12 MED ORDER — SODIUM CHLORIDE 0.9 % IV SOLN: 80.0000 mg | INTRAVENOUS | Status: AC

## 2023-06-12 MED ORDER — SODIUM CHLORIDE 0.9 % IV SOLN
INTRAVENOUS | Status: AC
  Administered 2023-06-12: 80 mg
  Filled 2023-06-12: qty 2

## 2023-06-12 MED ORDER — MIDAZOLAM HCL 5 MG/5ML IJ SOLN
INTRAMUSCULAR | Status: DC | PRN
  Administered 2023-06-12: 1 mg via INTRAVENOUS

## 2023-06-12 MED ORDER — AMLODIPINE BESYLATE 10 MG PO TABS
10.0000 mg | ORAL_TABLET | Freq: Every day | ORAL | Status: DC
  Administered 2023-06-12 – 2023-06-13 (×2): 10 mg via ORAL
  Filled 2023-06-12: qty 1

## 2023-06-12 MED ORDER — NITROGLYCERIN 0.4 MG SL SUBL: 0.4000 mg | SUBLINGUAL_TABLET | SUBLINGUAL | Status: DC | PRN

## 2023-06-12 MED ORDER — FENTANYL CITRATE (PF) 100 MCG/2ML IJ SOLN
INTRAMUSCULAR | Status: DC | PRN
  Administered 2023-06-12: 50 ug via INTRAVENOUS

## 2023-06-12 MED ORDER — FENTANYL CITRATE (PF) 100 MCG/2ML IJ SOLN
INTRAMUSCULAR | Status: AC
  Filled 2023-06-12: qty 2

## 2023-06-12 MED ORDER — SPIRONOLACTONE 25 MG PO TABS
25.0000 mg | ORAL_TABLET | Freq: Every day | ORAL | Status: DC
  Administered 2023-06-13: 25 mg via ORAL
  Filled 2023-06-12: qty 1

## 2023-06-12 MED ORDER — AMLODIPINE BESYLATE 5 MG PO TABS
ORAL_TABLET | ORAL | Status: AC
  Filled 2023-06-12: qty 2

## 2023-06-12 MED ORDER — INSULIN ASPART 100 UNIT/ML IJ SOLN
0.0000 [IU] | Freq: Three times a day (TID) | INTRAMUSCULAR | Status: DC
  Administered 2023-06-12: 3 [IU] via SUBCUTANEOUS

## 2023-06-12 SURGICAL SUPPLY — 13 items
CABLE SURGICAL S-101-97-12 (CABLE) ×1
CATH CPS LOCATOR 3D MED (CATHETERS) ×1
HELIX LOCKING TOOL (MISCELLANEOUS) ×1
LEAD ULTIPACE 52 LPA1231/52 (Lead) ×1 IMPLANT
LEAD ULTIPACE 65 LPA1231/65 (Lead) ×1 IMPLANT
PACEMAKER ASSURITY DR-RF (Pacemaker) ×1 IMPLANT
PAD DEFIB RADIO PHYSIO CONN (PAD) ×1
SHEATH 7FR PRELUDE SNAP 13 (SHEATH) ×1
SHEATH 9FR PRELUDE SNAP 13 (SHEATH) ×1
SHEATH PROBE COVER 6X72 (BAG) ×1
SLITTER UNIVERSAL DS2A003 (MISCELLANEOUS) ×1
TRAY PACEMAKER INSERTION (PACKS) ×1
WIRE HI TORQ VERSACORE-J 145CM (WIRE) ×1

## 2023-06-12 NOTE — Plan of Care (Signed)
Care plan reviewed.

## 2023-06-12 NOTE — Discharge Instructions (Addendum)
After Your Pacemaker   You have a [employer] Pacemaker  ACTIVITY Do not lift your arm above shoulder height for 1 week after your procedure. After 7 days, you may progress as below.  You should remove your sling 24 hours after your procedure, unless otherwise instructed by your provider.     Friday June 19, 2023  Saturday June 20, 2023 Sunday June 21, 2023 Monday June 22, 2023   Do not lift, push, pull, or carry anything over 10 pounds with the affected arm until 6 weeks (Friday July 24, 2023 ) after your procedure.   You may drive AFTER your wound check, unless you have been told otherwise by your provider.   Ask your healthcare provider when you can go back to work   INCISION/Dressing If you are on a blood thinner such as Coumadin, Xarelto, Eliquis, Plavix, or Pradaxa please confirm with your provider when this should be resumed.   If large square, outer bandage is left in place, this can be removed after 24 hours from your procedure. Do not remove steri-strips or glue as below.   If a PRESSURE DRESSING (a bulky dressing that usually goes up over your shoulder) was applied or left in place, please follow instructions given by your provider on when to return to have this removed.   Monitor your Pacemaker site for redness, swelling, and drainage. Call the device clinic at [phone] if you experience these symptoms or fever/chills.  If your incision is sealed with Steri-strips or staples, you may shower 7 days after your procedure or when told by your provider. Do not remove the steri-strips or let the shower hit directly on your site. You may wash around your site with soap and water.    If you were discharged in a sling, please do not wear this during the day more than 48 hours after your surgery unless otherwise instructed. This may increase the risk of stiffness and soreness in your shoulder.   Avoid lotions, ointments, or perfumes over your incision until it is  well-healed.  You may use a hot tub or a pool AFTER your wound check appointment if the incision is completely closed.  Pacemaker Alerts:  Some alerts are vibratory and others beep. These are NOT emergencies. Please call our office to let us know. If this occurs at night or on weekends, it can wait until the next business day. Send a remote transmission.  If your device is capable of reading fluid status (for heart failure), you will be offered monthly monitoring to review this with you.   DEVICE MANAGEMENT Remote monitoring is used to monitor your pacemaker from home. This monitoring is scheduled every 91 days by our office. It allows [place] to keep an eye on the functioning of your device to ensure it is working properly. You will routinely see your Electrophysiologist annually (more often if necessary).   You should receive your ID card for your new device in 4-8 weeks. Keep this card with you at all times once received. Consider wearing a medical alert bracelet or necklace.  Your Pacemaker may be MRI compatible. This will be discussed at your next office visit/wound check.  You should avoid contact with strong electric or magnetic fields.   Do not use amateur (ham) radio equipment or electric (arc) welding torches. MP3 player headphones with magnets should not be used. Some devices are safe to use if held at least 12 inches (30 cm) from your Pacemaker. These include power tools, lawn  mowers, and speakers. If you are unsure if something is safe to use, ask your health care provider.  When using your cell phone, hold it to the ear that is on the opposite side from the Pacemaker. Do not leave your cell phone in a pocket over the Pacemaker.  You may safely use electric blankets, heating pads, computers, and microwave ovens.  Call the office right away if: You have chest pain. You feel more short of breath than you have felt before. You feel more light-headed than you have felt before. Your  incision starts to open up.  This information is not intended to replace advice given to you by your health care provider. Make sure you discuss any questions you have with your health care provider.

## 2023-06-12 NOTE — Progress Notes (Signed)
Orthopedic Tech Progress Note Patient Details:  Robert Ramirez 21-Jul-1945 [id number] Pt already had arm sling applied after his surgery.   Patient ID: Robert Ramirez, male   DOB: [birth year], 78 y.o.   MRN: [id number]  Diannia Ruder 06/12/2023, 7:48 PM

## 2023-06-12 NOTE — Interval H&P Note (Signed)
History and Physical Interval Note:  06/12/2023 1:25 PM  Robert Ramirez  has presented today for surgery, with the diagnosis of symptomatic bradycardia, evidence of extensive cardiac conduction disease and high grade AV block.  The various methods of treatment have been discussed with the patient and family. After consideration of risks, benefits and other options for treatment, the patient has consented to  Procedure(s): PACEMAKER IMPLANT (N/A) as a surgical intervention.  The patient's history has been reviewed, patient examined, no change in status, stable for surgery.  I have reviewed the patient's chart and labs.  Questions were answered to the patient's satisfaction.     Robert Ramirez

## 2023-06-13 ENCOUNTER — Ambulatory Visit (HOSPITAL_COMMUNITY): Payer: Medicare Other

## 2023-06-13 DIAGNOSIS — I129 Hypertensive chronic kidney disease with stage 1 through stage 4 chronic kidney disease, or unspecified chronic kidney disease: Secondary | ICD-10-CM | POA: Diagnosis not present

## 2023-06-13 DIAGNOSIS — E1122 Type 2 diabetes mellitus with diabetic chronic kidney disease: Secondary | ICD-10-CM | POA: Diagnosis not present

## 2023-06-13 DIAGNOSIS — I2510 Atherosclerotic heart disease of native coronary artery without angina pectoris: Secondary | ICD-10-CM | POA: Diagnosis not present

## 2023-06-13 DIAGNOSIS — I4430 Unspecified atrioventricular block: Secondary | ICD-10-CM

## 2023-06-13 DIAGNOSIS — I442 Atrioventricular block, complete: Secondary | ICD-10-CM | POA: Diagnosis not present

## 2023-06-13 LAB — GLUCOSE, CAPILLARY: Glucose-Capillary: 72 mg/dL (ref 70–99)

## 2023-06-13 NOTE — Progress Notes (Signed)
Electrophysiology Progress Note  [name] underwent implantation of a dual chamber pacemaker yesterday by Dr. Jimmey Ralph.  I saw and examined the patient this morning.  I reviewed his chest x-ray and his device interrogation.  Today, the patient has no complaints.  His device site is minimally tender.  The device is functioning normally.  On imaging, the leads appear to be in good position without evidence of pneumothorax.  The incision site is clean and dry with Steri-Strips in place.  There is no evidence of infection or hematoma.  We will make arrangements for discharge home this morning.

## 2023-06-13 NOTE — Plan of Care (Signed)
  Problem: Education: Goal: Knowledge of cardiac device and self-care will improve Outcome: Progressing Goal: Ability to safely manage health related needs after discharge will improve Outcome: Progressing   Problem: Cardiac: Goal: Ability to achieve and maintain adequate cardiopulmonary perfusion will improve Outcome: Progressing   Problem: Education: Goal: Knowledge of General Education information will improve Description: Including pain rating scale, medication(s)/side effects and non-pharmacologic comfort measures Outcome: Progressing   Problem: Health Behavior/Discharge Planning: Goal: Ability to manage health-related needs will improve Outcome: Progressing   Problem: Clinical Measurements: Goal: Will remain free from infection Outcome: Progressing   Problem: Activity: Goal: Risk for activity intolerance will decrease Outcome: Progressing   Problem: Nutrition: Goal: Adequate nutrition will be maintained Outcome: Progressing   Problem: Elimination: Goal: Will not experience complications related to bowel motility Outcome: Progressing Goal: Will not experience complications related to urinary retention Outcome: Progressing   Problem: Skin Integrity: Goal: Risk for impaired skin integrity will decrease Outcome: Progressing   Problem: Tissue Perfusion: Goal: Adequacy of tissue perfusion will improve Outcome: Progressing

## 2023-06-13 NOTE — Discharge Summary (Signed)
Discharge Summary    Patient ID: [name] MRN: [id number]; DOB: 1946/05/02  Admit date: 06/12/2023 Discharge date: 06/13/2023  PCP:  Rema Fendt, NP   [facility] Providers Cardiologist:  Orbie Pyo, MD  Electrophysiologist:  [name], MD      Discharge Diagnoses    Principal Problem:   AV block   Diagnostic Studies/Procedures    Pacemaker Implant 06/12/23 CONCLUSIONS:   1. Successful dual chamber pacemaker implant with LBBAP lead  2.  No early apparent complications.    [name], MD Cardiac Electrophysiology _____________   History of Present Illness     Robert Ramirez is a 78 y.o. male with a past medical history of CAD, DM, HTN, HLD, aortic atherosclerosis, CKD, complete heart block. Followed by Dr. Lynnette Caffey and Dr. Jimmey Ralph. Has a baseline right bundle branch block, left posterior fascicular block, and first-degree AV delay. He was seen by Dr. Lynnette Caffey on 05/07/23 for a routine follow-up visit. At that time, EKG was performed and was concerning for atrial fibrillation. He was referred to Afib clinic and was seen on 05/28/23. There, EKG was concerning for complete heart block. He was then referred to EP for PPM evaluation. He was seen by Dr. Jimmey Ralph on 06/09/23. At that time, patient reported being asymptomatic, but his function status was somewhat limited at baseline. Reported having dizziness for over 2 years. Denied syncope. Dr. Jimmey Ralph reviewed EKG from 12/12 and determined that it was not atrial fibrillation, but was rather sinus with extremely long first-degree AV block vs intermittent high-grade AV block. Overall, patient met criteria for permanent pacemaker implant. Was scheduled for PPM implantation on 06/12/23.   Hospital Course    Patient presented on 06/12/23 for his scheduled pacemaker implant, with the diagnosis of symptomatic bradycardia, evidence of extensive cardiac conduction disease, and high grade AV block. He was taken to the EP  lab by Dr. Jimmey Ralph, and underwent successful dual chamber pacemaker implant with LBBAP lead. There were no early apparent complications. After his procedure, he was admitted for overnight observation.   On 1/18, patient was seen and examined by Dr. Nelly Laurence. On chest X-ray, the leads appeared to be in good position without evidence of pneumothorax. The incision site was clean and dry with steri-strips in place. There was no evidence of infection or hematoma on exam. Device interrogation showed that device was functioning normally. Patient was cleared for discharge.   Patient is scheduled for a wound check on 1/29. Also has follow up with Dr. Jimmey Ralph on 4/24. Post PPM care instructions provided on AVS.      Did the patient have an acute coronary syndrome (MI, NSTEMI, STEMI, etc) this admission?:  No                               Did the patient have a percutaneous coronary intervention (stent / angioplasty)?:  No.      _____________  Discharge Vitals Blood pressure 134/83, pulse 67, temperature 98.5 F (36.9 C), temperature source Oral, resp. rate 20, height 6' (1.829 m), weight 78.3 kg, SpO2 91%.  Filed Weights   06/12/23 1052 06/13/23 0411  Weight: 81.6 kg 78.3 kg    Labs & Radiologic Studies    CBC No results for input(s): "WBC", "NEUTROABS", "HGB", "HCT", "MCV", "PLT" in the last 72 hours. Basic Metabolic Panel No results for input(s): "NA", "K", "CL", "CO2", "GLUCOSE", "BUN", "CREATININE", "CALCIUM", "MG", "PHOS" in the  last 72 hours. Liver Function Tests No results for input(s): "AST", "ALT", "ALKPHOS", "BILITOT", "PROT", "ALBUMIN" in the last 72 hours. No results for input(s): "LIPASE", "AMYLASE" in the last 72 hours. High Sensitivity Troponin:   No results for input(s): "TROPONINIHS" in the last 720 hours.  BNP Invalid input(s): "POCBNP" D-Dimer No results for input(s): "DDIMER" in the last 72 hours. Hemoglobin A1C Recent Labs    06/12/23 1814  HGBA1C 7.5*   Fasting Lipid  Panel No results for input(s): "CHOL", "HDL", "LDLCALC", "TRIG", "CHOLHDL", "LDLDIRECT" in the last 72 hours. Thyroid Function Tests No results for input(s): "TSH", "T4TOTAL", "T3FREE", "THYROIDAB" in the last 72 hours.  Invalid input(s): "FREET3" _____________  DG Chest 2 View Result Date: 06/13/2023 CLINICAL DATA:  Cardiac device in place EXAM: CHEST - 2 VIEW COMPARISON:  01/08/2023 FINDINGS: Chronic lung disease with calcified pleural plaques and nodal calcifications on both sides. No acute opacity, effusion, or pneumothorax. Normal heart size and aortic contours. Dual-chamber pacer leads from the left since prior, projecting over the right atrial appendage and right ventricle. IMPRESSION: Dual-chamber pacer implant without acute finding. Electronically Signed   By: Tiburcio Pea M.D.   On: 06/13/2023 06:41   EP PPM/ICD IMPLANT Result Date: 06/12/2023  CONCLUSIONS:  1. Successful dual chamber pacemaker implant with LBBAP lead  2.  No early apparent complications. [name], MD Cardiac Electrophysiology   ECHOCARDIOGRAM COMPLETE Result Date: 06/10/2023    ECHOCARDIOGRAM REPORT   Patient Name:   Robert Ramirez Date of Exam: 06/10/2023 Medical Rec #:  [id number]       Height:       72.0 in Accession #:    [id number]      Weight:       183.6 lb Date of Birth:  May 29, 1945        BSA:          2.054 m Patient Age:    77 years        BP:           138/58 mmHg Patient Gender: M               HR:           69 bpm. Exam Location:  [facility] Procedure: 2D Echo, Cardiac Doppler, Color Doppler and Strain Analysis Indications:    I48.91* Unspeicified atrial fibrillation  History:        Patient has prior history of Echocardiogram examinations, most                 recent 03/13/2018. CAD, Abnormal ECG, Arrythmias:Atrial                 Fibrillation and RBBB, Signs/Symptoms:Dizziness/Lightheadedness;                 Risk Factors:Hypertension, Diabetes and Dyslipidemia.  Sonographer:    Ilda Mori MHA, BS,  RDCS Referring Phys: [id number] Charlies Constable [facility] IMPRESSIONS  1. Left ventricular ejection fraction, by estimation, is 55 to 60%. The left ventricle has normal function. The left ventricle has no regional wall motion abnormalities. Left ventricular diastolic parameters are indeterminate. The average left ventricular global longitudinal strain is -14.4 %.  2. Right ventricular systolic function is low normal. The right ventricular size is moderately enlarged. There is moderately elevated pulmonary artery systolic pressure. The estimated right ventricular systolic pressure is 59.8 mmHg.  3. Right atrial size was moderately dilated.  4. The mitral valve is normal in structure. Trivial mitral valve regurgitation. No evidence  of mitral stenosis.  5. The aortic valve is tricuspid. Aortic valve regurgitation is not visualized. No aortic stenosis is present.  6. There is borderline dilatation of the aortic root, measuring 38 mm.  7. The inferior vena cava is normal in size with greater than 50% respiratory variability, suggesting right atrial pressure of 3 mmHg. FINDINGS  Left Ventricle: Left ventricular ejection fraction, by estimation, is 55 to 60%. The left ventricle has normal function. The left ventricle has no regional wall motion abnormalities. The average left ventricular global longitudinal strain is -14.4 %. The left ventricular internal cavity size was normal in size. There is no left ventricular hypertrophy. Left ventricular diastolic parameters are indeterminate. Right Ventricle: The right ventricular size is moderately enlarged. No increase in right ventricular wall thickness. Right ventricular systolic function is low normal. There is moderately elevated pulmonary artery systolic pressure. The tricuspid regurgitant velocity is 3.70 m/s, and with an assumed right atrial pressure of 5 mmHg, the estimated right ventricular systolic pressure is 59.8 mmHg. Left Atrium: Left atrial size was normal in size. Right  Atrium: Right atrial size was moderately dilated. Pericardium: There is no evidence of pericardial effusion. Mitral Valve: The mitral valve is normal in structure. Trivial mitral valve regurgitation. No evidence of mitral valve stenosis. Tricuspid Valve: The tricuspid valve is normal in structure. Tricuspid valve regurgitation is mild . No evidence of tricuspid stenosis. Aortic Valve: The aortic valve is tricuspid. Aortic valve regurgitation is not visualized. No aortic stenosis is present. Aortic valve mean gradient measures 4.0 mmHg. Aortic valve peak gradient measures 7.7 mmHg. Pulmonic Valve: The pulmonic valve was normal in structure. Pulmonic valve regurgitation is not visualized. No evidence of pulmonic stenosis. Aorta: The aortic root is normal in size and structure. There is borderline dilatation of the aortic root, measuring 38 mm. Venous: The inferior vena cava is normal in size with greater than 50% respiratory variability, suggesting right atrial pressure of 3 mmHg. IAS/Shunts: No atrial level shunt detected by color flow Doppler.  LEFT VENTRICLE PLAX 2D LVIDd:         4.80 cm LVIDs:         2.80 cm 2D Longitudinal Strain LV PW:         1.00 cm 2D Strain GLS Avg:     -14.4 % LV IVS:        1.00 cm  RIGHT VENTRICLE RV Basal diam:  5.40 cm TAPSE (M-mode): 2.7 cm LEFT ATRIUM             Index        RIGHT ATRIUM           Index LA diam:        3.50 cm 1.70 cm/m   RA Area:     25.50 cm LA Vol (A2C):   45.7 ml 22.24 ml/m  RA Volume:   89.90 ml  43.76 ml/m LA Vol (A4C):   34.0 ml 16.55 ml/m LA Biplane Vol: 41.1 ml 20.01 ml/m  AORTIC VALVE AV Vmax:           139.00 cm/s AV Vmean:          91.400 cm/s AV VTI:            0.308 m AV Peak Grad:      7.7 mmHg AV Mean Grad:      4.0 mmHg LVOT Vmax:         82.80 cm/s LVOT Vmean:  56.600 cm/s LVOT VTI:          0.183 m LVOT/AV VTI ratio: 0.59  AORTA Ao Root diam: 3.80 cm Ao Asc diam:  3.30 cm TRICUSPID VALVE TR Peak grad:   54.8 mmHg TR Vmax:         370.00 cm/s  SHUNTS Systemic VTI: 0.18 m Julien Nordmann MD Electronically signed by Julien Nordmann MD Signature Date/Time: 06/10/2023/9:46:22 PM    Final    Disposition   Pt is being discharged home today in good condition.  Follow-up Plans & Appointments     Discharge Instructions     Call MD for:  persistant dizziness or light-headedness   Complete by: As directed    Call MD for:  redness, tenderness, or signs of infection (pain, swelling, redness, odor or green/yellow discharge around incision site)   Complete by: As directed    Call MD for:  severe uncontrolled pain   Complete by: As directed    Call MD for:  temperature >100.4   Complete by: As directed    Diet - low sodium heart healthy   Complete by: As directed    Increase activity slowly   Complete by: As directed         Discharge Medications   Allergies as of 06/13/2023   No Known Allergies      Medication List     TAKE these medications    amLODipine 10 MG tablet Commonly known as: NORVASC Take 1 tablet (10 mg total) by mouth daily.   aspirin EC 81 MG tablet Take 81 mg by mouth daily. Swallow whole.   atorvastatin 40 MG tablet Commonly known as: LIPITOR Take 40 mg by mouth daily.   blood glucose meter kit and supplies Dispense based on patient and insurance preference. Use up to four times daily as directed. (FOR ICD-10 E10.9, E11.9).   chlorthalidone 25 MG tablet Commonly known as: HYGROTON TAKE 1 TABLET EVERY DAY   Droplet Pen Needles 31G X 8 MM Misc Generic drug: Insulin Pen Needle 1 each by Other route 2 (two) times daily with a meal.   empagliflozin 10 MG Tabs tablet Commonly known as: Jardiance Take 1 tablet (10 mg total) by mouth daily before breakfast.   levothyroxine 50 MCG tablet Commonly known as: SYNTHROID Take 1 tablet (50 mcg total) by mouth daily.   lisinopril 40 MG tablet Commonly known as: ZESTRIL TAKE 1 TABLET EVERY DAY   metFORMIN 1000 MG tablet Commonly known  as: GLUCOPHAGE Take 1 tablet (1,000 mg total) by mouth 2 (two) times daily with a meal.   nitroGLYCERIN 0.4 MG SL tablet Commonly known as: NITROSTAT Place 1 tablet (0.4 mg total) under the tongue every 5 (five) minutes as needed for chest pain.   NovoLIN N FlexPen 100 UNIT/ML FlexPen Generic drug: Insulin NPH (Human) (Isophane) Inject 33 Units into the skin 2 (two) times daily with a meal. What changed: how much to take   spironolactone 25 MG tablet Commonly known as: ALDACTONE TAKE 1 TABLET EVERY DAY   vitamin B-12 500 MCG tablet Commonly known as: CYANOCOBALAMIN Take 500 mcg by mouth daily.   Vitamin D-3 25 MCG (1000 UT) Caps Take 1 capsule by mouth every morning.           Outstanding Labs/Studies   Duration of Discharge Encounter: APP Time: 10 minutes   Signed, Jonita Albee, PA-C 06/13/2023, 8:21 AM

## 2023-06-15 ENCOUNTER — Encounter (HOSPITAL_COMMUNITY): Payer: Self-pay | Admitting: Cardiology

## 2023-06-15 ENCOUNTER — Other Ambulatory Visit (HOSPITAL_COMMUNITY): Payer: Medicare HMO

## 2023-06-16 ENCOUNTER — Telehealth: Payer: Self-pay | Admitting: Pharmacy Technician

## 2023-06-16 ENCOUNTER — Ambulatory Visit: Payer: Medicare Other | Attending: Cardiology | Admitting: Student

## 2023-06-16 ENCOUNTER — Telehealth: Payer: Self-pay | Admitting: Pharmacist

## 2023-06-16 ENCOUNTER — Encounter: Payer: Self-pay | Admitting: Student

## 2023-06-16 ENCOUNTER — Other Ambulatory Visit (HOSPITAL_COMMUNITY): Payer: Self-pay

## 2023-06-16 DIAGNOSIS — E1169 Type 2 diabetes mellitus with other specified complication: Secondary | ICD-10-CM | POA: Diagnosis not present

## 2023-06-16 DIAGNOSIS — E11319 Type 2 diabetes mellitus with unspecified diabetic retinopathy without macular edema: Secondary | ICD-10-CM

## 2023-06-16 DIAGNOSIS — Z794 Long term (current) use of insulin: Secondary | ICD-10-CM

## 2023-06-16 DIAGNOSIS — E785 Hyperlipidemia, unspecified: Secondary | ICD-10-CM | POA: Diagnosis not present

## 2023-06-16 NOTE — Telephone Encounter (Signed)
I spoke to [employer] and she just got Ozempic approved

## 2023-06-16 NOTE — Progress Notes (Signed)
Patient ID: Robert Ramirez                 DOB: 1945/07/09                    MRN: [id number]      HPI: Robert Ramirez is a 78 y.o. male patient referred to lipid clinic by Dr.Thukkani. PMH is significant for CAD, T2DM, HTN, stage 3a CKD,PAF    Baseline weight and BMI: 23.41 and weight 172 lbs  Current weight and BMI: 23.41 and weight 172 lbs  Current meds that affect weight: insulin Since using Jardiance only been using 30 units of NPH twice daily. Checks BG once a week. BG staying in 120- 160 range depends on the night before meal. But staying below 200 since started Jardiance. Reports his welling improved significantly since he is on Jardiance. We reviewed GLP1 and its CV benefits along with BG lowering effect discussed the dose titration schedule with aim to stop insulin. Patient reports he gets shakiness and sweats when his BG goes down.   For cholesterol we discussed risk factors last LDLc and goal. Reviewed options for lowering LDL cholesterol, including ezetimibe, PCSK-9 inhibitors, bempedoic acid and inclisiran.  Discussed mechanisms of action, dosing, side effects and potential decreases in LDL cholesterol.  Also reviewed cost information and potential options for patient assistance.  Current Medications: Atorvastatin 40 mg daily  Intolerances: none  Risk Factors:  CAD, T2DM, HTN, stage 3a CKD LDL goal: <55 mg/dl  Last lab: LDLc 82, TC 145, TG 84, HDL 47 (04/2023) Diet: not great , loves beans, potatoes and beef Eats lots of vegetables   Exercise: walk 50 yards twice daily  Does yard work and stays active around the house    Labs: Lab Results  Component Value Date   HGBA1C 7.5 (H) 06/12/2023    Wt Readings from Last 1 Encounters:  06/13/23 172 lb 9.9 oz (78.3 kg)    BP Readings from Last 1 Encounters:  06/13/23 118/72   Pulse Readings from Last 1 Encounters:  06/13/23 90       Component Value Date/Time   CHOL 145 05/07/2023 0917   TRIG 84 05/07/2023 0917    HDL 47 05/07/2023 0917   CHOLHDL 3.1 05/07/2023 0917   LDLCALC 82 05/07/2023 0917    Past Medical History:  Diagnosis Date   Diabetes mellitus    High cholesterol    Hypertension     Current Outpatient Medications on File Prior to Visit  Medication Sig Dispense Refill   amLODipine (NORVASC) 10 MG tablet Take 1 tablet (10 mg total) by mouth daily. 30 tablet 2   aspirin EC 81 MG tablet Take 81 mg by mouth daily. Swallow whole.     atorvastatin (LIPITOR) 40 MG tablet Take 40 mg by mouth daily.     blood glucose meter kit and supplies Dispense based on patient and insurance preference. Use up to four times daily as directed. (FOR ICD-10 E10.9, E11.9). 1 each 0   chlorthalidone (HYGROTON) 25 MG tablet TAKE 1 TABLET EVERY DAY 90 tablet 3   Cholecalciferol (VITAMIN D-3) 25 MCG (1000 UT) CAPS Take 1 capsule by mouth every morning.     empagliflozin (JARDIANCE) 10 MG TABS tablet Take 1 tablet (10 mg total) by mouth daily before breakfast. 90 tablet 3   Insulin NPH, Human,, Isophane, (NOVOLIN N FLEXPEN) 100 UNIT/ML Kiwkpen Inject 33 Units into the skin 2 (two) times daily with a meal. (Patient taking differently:  Inject 30-32 Units into the skin 2 (two) times daily with a meal.) 15 mL 3   Insulin Pen Needle (DROPLET PEN NEEDLES) 31G X 8 MM MISC 1 each by Other route 2 (two) times daily with a meal. 200 each 0   levothyroxine (SYNTHROID) 50 MCG tablet Take 1 tablet (50 mcg total) by mouth daily. 90 tablet 0   lisinopril (ZESTRIL) 40 MG tablet TAKE 1 TABLET EVERY DAY 90 tablet 3   metFORMIN (GLUCOPHAGE) 1000 MG tablet Take 1 tablet (1,000 mg total) by mouth 2 (two) times daily with a meal. 60 tablet 2   nitroGLYCERIN (NITROSTAT) 0.4 MG SL tablet Place 1 tablet (0.4 mg total) under the tongue every 5 (five) minutes as needed for chest pain. 25 tablet 3   spironolactone (ALDACTONE) 25 MG tablet TAKE 1 TABLET EVERY DAY 90 tablet 3   vitamin B-12 (CYANOCOBALAMIN) 500 MCG tablet Take 500 mcg by mouth  daily.     No current facility-administered medications on file prior to visit.    No Known Allergies      Family History:  Relation Problem Comments  Mother Heart failure at age 17     Father Cancer     Sister Cancer   Diabetes      Social History:  Alcohol: none  Smoking: none   Labs: Lipid Panel     Component Value Date/Time   CHOL 145 05/07/2023 0917   TRIG 84 05/07/2023 0917   HDL 47 05/07/2023 0917   CHOLHDL 3.1 05/07/2023 0917   LDLCALC 82 05/07/2023 0917   LABVLDL 16 05/07/2023 0917    Past Medical History:  Diagnosis Date   Diabetes mellitus    High cholesterol    Hypertension     Current Outpatient Medications on File Prior to Visit  Medication Sig Dispense Refill   amLODipine (NORVASC) 10 MG tablet Take 1 tablet (10 mg total) by mouth daily. 30 tablet 2   aspirin EC 81 MG tablet Take 81 mg by mouth daily. Swallow whole.     atorvastatin (LIPITOR) 40 MG tablet Take 40 mg by mouth daily.     blood glucose meter kit and supplies Dispense based on patient and insurance preference. Use up to four times daily as directed. (FOR ICD-10 E10.9, E11.9). 1 each 0   chlorthalidone (HYGROTON) 25 MG tablet TAKE 1 TABLET EVERY DAY 90 tablet 3   Cholecalciferol (VITAMIN D-3) 25 MCG (1000 UT) CAPS Take 1 capsule by mouth every morning.     empagliflozin (JARDIANCE) 10 MG TABS tablet Take 1 tablet (10 mg total) by mouth daily before breakfast. 90 tablet 3   Insulin NPH, Human,, Isophane, (NOVOLIN N FLEXPEN) 100 UNIT/ML Kiwkpen Inject 33 Units into the skin 2 (two) times daily with a meal. (Patient taking differently: Inject 30-32 Units into the skin 2 (two) times daily with a meal.) 15 mL 3   Insulin Pen Needle (DROPLET PEN NEEDLES) 31G X 8 MM MISC 1 each by Other route 2 (two) times daily with a meal. 200 each 0   levothyroxine (SYNTHROID) 50 MCG tablet Take 1 tablet (50 mcg total) by mouth daily. 90 tablet 0   lisinopril (ZESTRIL) 40 MG tablet TAKE 1 TABLET EVERY DAY  90 tablet 3   metFORMIN (GLUCOPHAGE) 1000 MG tablet Take 1 tablet (1,000 mg total) by mouth 2 (two) times daily with a meal. 60 tablet 2   nitroGLYCERIN (NITROSTAT) 0.4 MG SL tablet Place 1 tablet (0.4 mg total) under the tongue  every 5 (five) minutes as needed for chest pain. 25 tablet 3   spironolactone (ALDACTONE) 25 MG tablet TAKE 1 TABLET EVERY DAY 90 tablet 3   vitamin B-12 (CYANOCOBALAMIN) 500 MCG tablet Take 500 mcg by mouth daily.     No current facility-administered medications on file prior to visit.    No Known Allergies  Assessment/Plan:  1. Hyperlipidemia -  Assessment and Plan:   LDLC goal <55 last LDLc 82 mg/dl. Tolerates high intensity statin(Atorvastatin 40 mg daily) well without side effects. Discussed risk factors, LDLc goal and non statins LDLc lowering therapy. Will initiate coverage assessment for PCSK9i. Will repeat lab in 2-3 months of therapy modifications. If insurance denies PCSK9i will add Zetia to current high intensity statins   T2DM:  Assessment and Plan:  Last A1c 7.5 (Jan/25) while on metformin 1000 mg twice daily, Jardiance 10 mg daily and NPH 30 units twice daily  FBG ~120-160. Tolerates current medications well however often experiences hypoglycemia ( symptoms- sweats and shakiness) since he is on Jardiance and insulin. Due to advance age NPH would be inappropriate ( increased risk of hypoglycemia) given  ASCVD addition of GLP1 to SGLT2i and metformin would be reasonable next step.  Will start coverage assessment for Ozempic. Confirmed patient has no personal or family history of medullary thyroid carcinoma (MTC) or Multiple Endocrine Neoplasia syndrome type 2 (MEN 2). Injection technique reviewed at today's visit.  Advised patient on common side effects including nausea, diarrhea, dyspepsia, decreased appetite, and fatigue. Counseled patient on reducing meal size and how to titrate medication to minimize side effects. Counseled patient to call if  intolerable side effects or if experiencing dehydration, abdominal pain, or dizziness. Patient will adhere to dietary modifications and will target at least 150 minutes of moderate intensity exercise weekly.   Follow up in 1 month via telephone for tolerability update and dose titration.  Thank you,  Carmela Hurt, Pharm.D [facility] A Division of [facility] [address] [place], [place] [address]  Phone: [phone]; Fax: [phone]

## 2023-06-16 NOTE — Telephone Encounter (Signed)
Pharmacy Patient Advocate Encounter   Received notification from Pt Calls Messages that prior authorization for ozempic is required/requested.   Insurance verification completed.   The patient is insured through [facility] .   Per test claim: PA required; PA submitted to above mentioned insurance via CoverMyMeds Key/confirmation #/EOC [id number] Status is pending

## 2023-06-16 NOTE — Telephone Encounter (Signed)
I ran a test claim for repatha and praluent and they both require a prior auth but allowed 1 month transition supply. The repatha was cheaper so I am doing the prior auth for that, however, its stopping me because its asking about zetia. I do not see the reason for not trying zetia. Can you help with this question? I have the key saved for now [facility]

## 2023-06-16 NOTE — Telephone Encounter (Signed)
Pharmacy Patient Advocate Encounter   Received notification from Pt Calls Messages that prior authorization for Repatha SureClick 140MG /ML auto-injectors and Praluent  is required/requested.   Insurance verification completed.   The patient is insured through [facility] .   Per test claim: PA required; PA started via CoverMyMeds. KEY [id number] . Please see clinical question(s) below that I am not finding the answer to in her chart and advise.  I sent Carmela Hurt and message about:

## 2023-06-16 NOTE — Telephone Encounter (Signed)
Pharmacy Patient Advocate Encounter  Received notification from [facility] that Prior Authorization for ozempic has been APPROVED from 06/16/23 to 05/25/24. Ran test claim, Copay is $302.00 one month (DEDUCTIBLE). This test claim was processed through [facility]- copay amounts may vary at other pharmacies due to pharmacy/plan contracts, or as the patient moves through the different stages of their insurance plan.   PA #/Case ID/Reference #: [id number]

## 2023-06-16 NOTE — Patient Instructions (Signed)
Your Results:             Your most recent labs Goal  Total Cholesterol 145 < 200  Triglycerides 84 < 150  HDL (happy/good cholesterol) 47 > 40  LDL (lousy/bad cholesterol 82 < 55   Medication changes: continue taking atorvastatin 40 mg daily  We will start the process to get PCSK9i( Repatha or Praluent)  covered by your insurance.  Once the prior authorization is complete, we will call you to let you know and confirm pharmacy information.   Praluent is a cholesterol medication that improved your body's ability to get rid of "bad cholesterol" known as LDL. It can lower your LDL up to 60%. It is an injection that is given under the skin every 2 weeks. The most common side effects of Praluent include runny nose, symptoms of the common cold, rarely flu or flu-like symptoms, back/muscle pain in about 3-4% of the patients, and redness, pain, or bruising at the injection site.    Repatha is a cholesterol medication that improved your body's ability to get rid of "bad cholesterol" known as LDL. It can lower your LDL up to 60%! It is an injection that is given under the skin every 2 weeks. The medication often requires a prior authorization from your insurance company.  The most common side effects of Repatha include runny nose, symptoms of the common cold, rarely flu or flu-like symptoms, back/muscle pain in about 3-4% of the patients, and redness, pain, or bruising at the injection site.   Lab orders: We want to repeat labs after 2-3 months.  We will send you a lab order to remind you once we get closer to that time.         GLP1 Agonist Titration Plan:  we have applied for prior authorization for Ozempic. Will plan to follow the titration plan as below, pending patient is tolerating each dose before increasing to the next. Can slow titration if needed for tolerability. We will reduce dose of NPH as we titrate Ozempic dose up   Ozempic:  -Month 1: Inject Ozempic 0.25 mg SQ once weekly x 4  weeks -Month 2: Inject Ozempic 0.5 mg  SQ once weekly x 6 weeks -Month 3: Inject Ozempic 1 mg SQ once weekly x 4 weeks -Month 4+: Inject Ozempic 2 mg SQ once weekly

## 2023-06-17 NOTE — Telephone Encounter (Signed)
Pharmacy Patient Advocate Encounter   Received notification from Pt Calls Messages that prior authorization for Repatha SureClick 140MG /ML auto-injectors  is required/requested.   Insurance verification completed.   The patient is insured through [facility] .   Per test claim: PA required; PA submitted to above mentioned insurance via CoverMyMeds Key/confirmation #/EOC [id number] Status is pending

## 2023-06-18 ENCOUNTER — Telehealth: Payer: Self-pay | Admitting: Pharmacist

## 2023-06-18 DIAGNOSIS — E1169 Type 2 diabetes mellitus with other specified complication: Secondary | ICD-10-CM

## 2023-06-18 MED ORDER — EZETIMIBE 10 MG PO TABS: 10.0000 mg | ORAL_TABLET | Freq: Every day | ORAL | 3 refills | Status: DC

## 2023-06-18 NOTE — Telephone Encounter (Signed)
Pharmacy Patient Advocate Encounter  Received notification from [facility] that Prior Authorization for Repatha SureClick 140MG /ML auto-injectors  has been DENIED.  See denial reason below. No denial letter attached in CMM. Will attach denial letter to Media tab once received.   PA #/Case ID/Reference #:  [id number]

## 2023-06-18 NOTE — Addendum Note (Signed)
Addended by: Tylene Fantasia on: 06/18/2023 10:52 AM   Modules accepted: Orders

## 2023-06-18 NOTE — Telephone Encounter (Signed)
Call pt to inform about Repatha denial and Ozempic approval and co-pay.  Patient is in agreement to try Zetia 10 mg daily and will repeat lab April 3,2025 if LDLc still above goal will re-apply for Repatha PA .  Can not afford Ozempic. Advised patient to call insurance and opt in to monthly payment plan to divide deductible in affordable amount. Patient will reach out to the insurance and let us know if he is interested going on Ozempic

## 2023-06-24 ENCOUNTER — Ambulatory Visit: Payer: Medicare Other | Attending: Internal Medicine

## 2023-06-24 DIAGNOSIS — I442 Atrioventricular block, complete: Secondary | ICD-10-CM | POA: Diagnosis not present

## 2023-06-24 NOTE — Progress Notes (Signed)
Normal Pacemaker wound check by Raj Janus, RN. Wound well healed. Thresholds, sensing, and impedances consistent with implant measurements and at 3.5V safety margin/auto capture until 3 month visit. 4 AT episodes, longest 14 secs.. Reviewed arm restrictions to continue for 6 weeks total post op.  Pt enrolled in remote follow-up.

## 2023-06-24 NOTE — Patient Instructions (Signed)
   After Your Pacemaker   Monitor your pacemaker site for redness, swelling, and drainage. Call the device clinic at [phone] if you experience these symptoms or fever/chills.  Your incision was closed with Steri-strips or staples:  You may shower 7 days after your procedure and wash your incision with soap and water. Avoid lotions, ointments, or perfumes over your incision until it is well-healed.  You may use a hot tub or a pool after your wound check appointment if the incision is completely closed.  Do not lift, push or pull greater than 10 pounds with the affected arm until 6 weeks after your procedure. UNTIL AFTER FEBRUARY 28TH.  There are no other restrictions in arm movement after your wound check appointment.  You may drive, unless driving has been restricted by your healthcare providers.  Remote monitoring is used to monitor your pacemaker from home. This monitoring is scheduled every 91 days by our office. It allows [place] to keep an eye on the functioning of your device to ensure it is working properly. You will routinely see your Electrophysiologist annually (more often if necessary).

## 2023-06-26 LAB — CUP PACEART INCLINIC DEVICE CHECK
Date Time Interrogation Session: 20250129192207
Implantable Lead Connection Status: 753985
Implantable Lead Connection Status: 753985
Implantable Lead Implant Date: 20250117
Implantable Lead Implant Date: 20250117
Implantable Lead Location: 753859
Implantable Lead Location: 753860
Implantable Pulse Generator Implant Date: 20250117
Pulse Gen Model: 2272
Pulse Gen Serial Number: 5998043

## 2023-07-03 ENCOUNTER — Encounter: Payer: Self-pay | Admitting: Family

## 2023-07-03 ENCOUNTER — Ambulatory Visit (INDEPENDENT_AMBULATORY_CARE_PROVIDER_SITE_OTHER): Payer: Medicare Other | Admitting: Family

## 2023-07-03 VITALS — BP 130/73 | HR 82 | Temp 97.5°F | Ht 72.0 in | Wt 175.6 lb

## 2023-07-03 DIAGNOSIS — Z23 Encounter for immunization: Secondary | ICD-10-CM | POA: Diagnosis not present

## 2023-07-03 DIAGNOSIS — F17290 Nicotine dependence, other tobacco product, uncomplicated: Secondary | ICD-10-CM | POA: Diagnosis not present

## 2023-07-03 DIAGNOSIS — Z0000 Encounter for general adult medical examination without abnormal findings: Secondary | ICD-10-CM

## 2023-07-03 DIAGNOSIS — E039 Hypothyroidism, unspecified: Secondary | ICD-10-CM | POA: Diagnosis not present

## 2023-07-03 DIAGNOSIS — E119 Type 2 diabetes mellitus without complications: Secondary | ICD-10-CM | POA: Diagnosis not present

## 2023-07-03 DIAGNOSIS — Z7984 Long term (current) use of oral hypoglycemic drugs: Secondary | ICD-10-CM

## 2023-07-03 DIAGNOSIS — Z0001 Encounter for general adult medical examination with abnormal findings: Secondary | ICD-10-CM | POA: Diagnosis not present

## 2023-07-03 MED ORDER — LEVOTHYROXINE SODIUM 50 MCG PO TABS
50.0000 ug | ORAL_TABLET | Freq: Every day | ORAL | 0 refills | Status: DC
Start: 2023-07-03 — End: 2023-09-30

## 2023-07-03 NOTE — Progress Notes (Signed)
 No other concerns to discuss.   Wants Flu vaccine.

## 2023-07-03 NOTE — Progress Notes (Signed)
 Patient ID: Robert Ramirez, male    DOB: 05-Jul-1945  MRN: [id number]  CC: Annual Exam  Subjective: Robert Ramirez is a 78 y.o. male who presents for annual exam.   His concerns today include:  - Doing well on Levothyroxine , no issues/concerns.  - Established with Cardiology for chronic conditions including diabetes (most recent office visit 06/16/2023). - Enlarged area of skin near right neck. Denies red flag symptoms. States was seen by Dermatology in the past and told everything was ok. Denies referral to specialist.  - No further issues/concerns for discussion today.   Patient Active Problem List   Diagnosis Date Noted   AV block 06/12/2023   Complete heart block (HCC) 05/28/2023   Poorly-controlled hypertension 04/05/2019   Conductive hearing loss 04/05/2019   Chewing tobacco use 04/05/2019   Bilateral impacted cerumen 04/05/2019   Primary hyperparathyroidism (HCC) 11/21/2018   Chronic respiratory failure with hypoxia and hypercapnia (HCC) 11/18/2018   Chronic obstructive asthma (with obstructive pulmonary disease) (HCC) 06/09/2018   Other secondary pulmonary hypertension (HCC) 06/09/2018   Healthcare maintenance 03/30/2018   Serum total bilirubin elevated 03/26/2018   Asbestos exposure 03/26/2018   Lesion of skin of scalp 03/26/2018   Coronary artery disease involving native coronary artery of native heart without angina pectoris 03/26/2018   Acquired hypothyroidism 03/26/2018   Pleural plaque 03/14/2018   Diabetes (HCC) 03/14/2018   Somnolence    Hyponatremia    Abnormal chest x-ray    Hypertension associated with diabetes (HCC) 07/18/2014   Mixed hyperlipidemia 07/18/2014   Diabetic retinopathy associated with type 2 diabetes mellitus (HCC) 07/18/2014     Current Outpatient Medications on File Prior to Visit  Medication Sig Dispense Refill   amLODipine  (NORVASC ) 10 MG tablet Take 1 tablet (10 mg total) by mouth daily. 30 tablet 2   aspirin  EC 81 MG tablet Take  81 mg by mouth daily. Swallow whole.     atorvastatin  (LIPITOR) 40 MG tablet Take 40 mg by mouth daily.     chlorthalidone  (HYGROTON ) 25 MG tablet TAKE 1 TABLET EVERY DAY 90 tablet 3   Cholecalciferol (VITAMIN D-3) 25 MCG (1000 UT) CAPS Take 1 capsule by mouth every morning.     empagliflozin  (JARDIANCE ) 10 MG TABS tablet Take 1 tablet (10 mg total) by mouth daily before breakfast. 90 tablet 3   ezetimibe  (ZETIA ) 10 MG tablet Take 1 tablet (10 mg total) by mouth daily. 90 tablet 3   Insulin  NPH, Human,, Isophane, (NOVOLIN  N FLEXPEN) 100 UNIT/ML Kiwkpen Inject 33 Units into the skin 2 (two) times daily with a meal. (Patient taking differently: Inject 30-32 Units into the skin 2 (two) times daily with a meal.) 15 mL 3   Insulin  Pen Needle (DROPLET PEN NEEDLES) 31G X 8 MM MISC 1 each by Other route 2 (two) times daily with a meal. 200 each 0   lisinopril  (ZESTRIL ) 40 MG tablet TAKE 1 TABLET EVERY DAY 90 tablet 3   metFORMIN  (GLUCOPHAGE ) 1000 MG tablet Take 1 tablet (1,000 mg total) by mouth 2 (two) times daily with a meal. 60 tablet 2   nitroGLYCERIN  (NITROSTAT ) 0.4 MG SL tablet Place 1 tablet (0.4 mg total) under the tongue every 5 (five) minutes as needed for chest pain. 25 tablet 3   spironolactone  (ALDACTONE ) 25 MG tablet TAKE 1 TABLET EVERY DAY 90 tablet 3   vitamin B-12 (CYANOCOBALAMIN ) 500 MCG tablet Take 500 mcg by mouth daily.     blood glucose meter kit and supplies  Dispense based on patient and insurance preference. Use up to four times daily as directed. (FOR ICD-10 E10.9, E11.9). 1 each 0   No current facility-administered medications on file prior to visit.    No Known Allergies  Social History   Socioeconomic History   Marital status: Married    Spouse name: Not on file   Number of children: Not on file   Years of education: Not on file   Highest education level: Not on file  Occupational History   Not on file  Tobacco Use   Smoking status: Former    Current packs/day:  0.00    Types: Cigarettes    Quit date: 03/30/1980    Years since quitting: 43.2    Passive exposure: Never   Smokeless tobacco: Current    Types: Chew, Snuff  Vaping Use   Vaping status: Never Used  Substance and Sexual Activity   Alcohol use: No   Drug use: No   Sexual activity: Not Currently  Other Topics Concern   Not on file  Social History Narrative   Not on file   Social Drivers of Health   Financial Resource Strain: Not on file  Food Insecurity: No Food Insecurity (06/12/2023)   Hunger Vital Sign    Worried About Running Out of Food in the Last Year: Never true    Ran Out of Food in the Last Year: Never true  Transportation Needs: No Transportation Needs (06/12/2023)   PRAPARE - Administrator, Civil Service (Medical): No    Lack of Transportation (Non-Medical): No  Physical Activity: Not on file  Stress: Not on file  Social Connections: Patient Declined (06/12/2023)   Social Connection and Isolation Panel [NHANES]    Frequency of Communication with Friends and Family: Patient declined    Frequency of Social Gatherings with Friends and Family: Patient declined    Attends Religious Services: Patient declined    Active Member of Clubs or Organizations: Patient declined    Attends Banker Meetings: Patient declined    Marital Status: Patient declined  Intimate Partner Violence: Not At Risk (06/12/2023)   Humiliation, Afraid, Rape, and Kick questionnaire    Fear of Current or Ex-Partner: No    Emotionally Abused: No    Physically Abused: No    Sexually Abused: No    Family History  Problem Relation Age of Onset   Heart failure Mother    Cancer Father    Diabetes Sister    Cancer Sister     Past Surgical History:  Procedure Laterality Date   APPENDECTOMY     PACEMAKER IMPLANT N/A 06/12/2023   Procedure: PACEMAKER IMPLANT;  Surgeon: Kennyth Chew, MD;  Location: [facility] INVASIVE [facility];  Service: Cardiovascular;  Laterality: N/A;    RIGHT/LEFT HEART CATH AND CORONARY ANGIOGRAPHY N/A 03/17/2018   Procedure: RIGHT/LEFT HEART CATH AND CORONARY ANGIOGRAPHY;  Surgeon: Claudene Pacific, MD;  Location: [facility] INVASIVE CV LAB;  Service: Cardiovascular;  Laterality: N/A;    ROS: Review of Systems Negative except as stated above  PHYSICAL EXAM: BP 130/73   Pulse 82   Temp (!) 97.5 F (36.4 C) (Oral)   Ht 6' (1.829 m)   Wt 175 lb 9.6 oz (79.7 kg)   SpO2 93%   BMI 23.82 kg/m   Physical Exam HENT:     Head: Normocephalic and atraumatic.     Right Ear: Tympanic membrane, ear canal and external ear normal.     Left Ear: Tympanic  membrane, ear canal and external ear normal.     Nose: Nose normal.     Mouth/Throat:     Mouth: Mucous membranes are moist.     Pharynx: Oropharynx is clear.  Eyes:     Extraocular Movements: Extraocular movements intact.     Conjunctiva/sclera: Conjunctivae normal.     Pupils: Pupils are equal, round, and reactive to light.  Neck:     Thyroid : No thyroid  mass, thyromegaly or thyroid  tenderness.  Cardiovascular:     Rate and Rhythm: Normal rate and regular rhythm.     Pulses: Normal pulses.     Heart sounds: Normal heart sounds.  Pulmonary:     Effort: Pulmonary effort is normal.     Breath sounds: Normal breath sounds.  Abdominal:     General: Bowel sounds are normal.     Palpations: Abdomen is soft.  Genitourinary:    Comments: Patient declined. Musculoskeletal:        General: Normal range of motion.     Right shoulder: Normal.     Left shoulder: Normal.     Right upper arm: Normal.     Left upper arm: Normal.     Right elbow: Normal.     Left elbow: Normal.     Right forearm: Normal.     Left forearm: Normal.     Right wrist: Normal.     Left wrist: Normal.     Right hand: Normal.     Left hand: Normal.     Cervical back: Normal, normal range of motion and neck supple.     Thoracic back: Normal.     Lumbar back: Normal.     Right hip: Normal.     Left hip: Normal.      Right upper leg: Normal.     Left upper leg: Normal.     Right knee: Normal.     Left knee: Normal.     Right lower leg: Normal.     Left lower leg: Normal.     Right ankle: Normal.     Left ankle: Normal.     Right foot: Normal.     Left foot: Normal.  Skin:    General: Skin is warm and dry.     Capillary Refill: Capillary refill takes less than 2 seconds.  Neurological:     General: No focal deficit present.     Mental Status: He is alert and oriented to person, place, and time.  Psychiatric:        Mood and Affect: Mood normal.        Behavior: Behavior normal.     ASSESSMENT AND PLAN: 1. Annual physical exam (Primary) - Counseled on 150 minutes of exercise per week as tolerated, healthy eating (including decreased daily intake of saturated fats, cholesterol, added sugars, sodium), STI prevention, and routine healthcare maintenance.  2. Hypothyroidism, unspecified type - Continue Levothyroxine  as prescribed. Counseled on medication adherence/adverse effects.  - Routine screening.  - Follow-up with primary provider as scheduled. - TSH - levothyroxine  (SYNTHROID ) 50 MCG tablet; Take 1 tablet (50 mcg total) by mouth daily.  Dispense: 90 tablet; Refill: 0  3. Encounter for diabetic foot exam [facility]) - Referral to Podiatry for evaluation/management. - Ambulatory referral to Podiatry  Patient was given the opportunity to ask questions.  Patient verbalized understanding of the plan and was able to repeat key elements of the plan. Patient was given clear instructions to go to Emergency Department or return to medical  center if symptoms don't improve, worsen, or new problems develop.The patient verbalized understanding.   Orders Placed This Encounter  Procedures   TSH   Ambulatory referral to Podiatry     Requested Prescriptions   Signed Prescriptions Disp Refills   levothyroxine  (SYNTHROID ) 50 MCG tablet 90 tablet 0    Sig: Take 1 tablet (50 mcg total) by mouth daily.     Return in about 1 year (around 07/02/2024) for Physical per patient preference.  Greig JINNY Drones, NP

## 2023-07-04 LAB — TSH: TSH: 3.33 u[IU]/mL (ref 0.450–4.500)

## 2023-07-08 NOTE — Telephone Encounter (Signed)
Repatha AP denied and Ozempic is cost prohibitive see other encounter

## 2023-07-13 ENCOUNTER — Encounter: Payer: Self-pay | Admitting: Podiatry

## 2023-07-13 ENCOUNTER — Ambulatory Visit: Payer: Medicare Other | Admitting: Podiatry

## 2023-07-13 DIAGNOSIS — L03032 Cellulitis of left toe: Secondary | ICD-10-CM

## 2023-07-13 DIAGNOSIS — I739 Peripheral vascular disease, unspecified: Secondary | ICD-10-CM | POA: Diagnosis not present

## 2023-07-13 DIAGNOSIS — M79675 Pain in left toe(s): Secondary | ICD-10-CM | POA: Diagnosis not present

## 2023-07-13 DIAGNOSIS — M79674 Pain in right toe(s): Secondary | ICD-10-CM | POA: Diagnosis not present

## 2023-07-13 DIAGNOSIS — E119 Type 2 diabetes mellitus without complications: Secondary | ICD-10-CM | POA: Diagnosis not present

## 2023-07-13 DIAGNOSIS — L02612 Cutaneous abscess of left foot: Secondary | ICD-10-CM

## 2023-07-13 DIAGNOSIS — B351 Tinea unguium: Secondary | ICD-10-CM | POA: Diagnosis not present

## 2023-07-13 LAB — HM DIABETES FOOT EXAM

## 2023-07-13 MED ORDER — MUPIROCIN 2 % EX OINT: 1.0000 | TOPICAL_OINTMENT | Freq: Two times a day (BID) | CUTANEOUS | 2 refills | Status: DC

## 2023-07-13 MED ORDER — DOXYCYCLINE HYCLATE 100 MG PO TABS: 100.0000 mg | ORAL_TABLET | Freq: Two times a day (BID) | ORAL | 0 refills | Status: DC

## 2023-07-13 NOTE — Progress Notes (Signed)
Subjective:   Patient ID: Robert Ramirez, male   DOB: 78 y.o.   MRN: [id number]   HPI Chief Complaint  Patient presents with   [facility]    RM# [facility]     78 y.o. male with the above concerns. He states that the nails come off occasionally. No redness, drainage or signs of infection.  No history of ulcerations No numbness or tingling- occasional sharp pain but after moving it goes away. No injuries or falls No claudication symptoms   Sinus drainage in the left big toe.  He states that while in the hospital he was putting socks on and kept the toenail off.  No treatment he reports.   A1c 7.5 on 06/12/2023 Does not check regularly at home    Review of Systems  All other systems reviewed and are negative.       Objective:  Physical Exam  General: AAO x3, NAD  Dermatological: There is minimal toenail present on the left hallux and there is small Mehta purulence coming from the lateral aspect of the nail fold.  Localized erythema without any ascending cellulitis.  No fluctuation or crepitation, no malodor.  Remainder the nails are hypertrophic, dystrophic with yellow, brown discoloration.  Tenderness to palpation of the toenails 2 through 5 on the left and 1 through 5 on right.  No other open lesions identified.  Vascular: Dorsalis Pedis artery and Posterior Tibial artery pedal pulses are decreased bilateral with immedate capillary fill time. There is no pain with calf compression, swelling, warmth, erythema.   Neruologic: Grossly intact via light touch bilateral.    Musculoskeletal: No other areas of discomfort.     Assessment:   Symptomatic onychosis, localized infection left hallux, PAD     Plan:  -Treatment options discussed including all alternatives, risks, and complications -Etiology of symptoms were discussed  Symptomatic onychomycosis -Sharply debrided toenails x 9 without any complications or bleeding  Left hallux infection -Prescribed Keflex -Mupirocin ointment  dressing changes daily. -Monitor for any clinical signs or symptoms of infection and directed to call the office immediately should any occur or go to the ER.  Diabetic foot exam -Glucose control, daily foot inspection. -ABI ordered      Return in about 2 weeks (around 07/27/2023) for infection follow up .  Vivi Barrack DPM

## 2023-07-27 ENCOUNTER — Ambulatory Visit: Payer: Medicare Other

## 2023-07-27 DIAGNOSIS — I442 Atrioventricular block, complete: Secondary | ICD-10-CM | POA: Diagnosis not present

## 2023-07-28 LAB — CUP PACEART REMOTE DEVICE CHECK
Battery Remaining Longevity: 98 mo
Battery Remaining Percentage: 95.5 %
Battery Voltage: 3.02 V
Brady Statistic AP VP Percent: 5.5 %
Brady Statistic AP VS Percent: 1 %
Brady Statistic AS VP Percent: 92 %
Brady Statistic AS VS Percent: 1 %
Brady Statistic RA Percent Paced: 3.8 %
Brady Statistic RV Percent Paced: 98 %
Date Time Interrogation Session: 20250303020012
Implantable Lead Connection Status: 753985
Implantable Lead Connection Status: 753985
Implantable Lead Implant Date: 20250117
Implantable Lead Implant Date: 20250117
Implantable Lead Location: 753859
Implantable Lead Location: 753860
Implantable Pulse Generator Implant Date: 20250117
Lead Channel Impedance Value: 440 Ohm
Lead Channel Impedance Value: 460 Ohm
Lead Channel Pacing Threshold Amplitude: 0.75 V
Lead Channel Pacing Threshold Amplitude: 0.875 V
Lead Channel Pacing Threshold Pulse Width: 0.5 ms
Lead Channel Pacing Threshold Pulse Width: 0.5 ms
Lead Channel Sensing Intrinsic Amplitude: 4.1 mV
Lead Channel Sensing Intrinsic Amplitude: 8.3 mV
Lead Channel Setting Pacing Amplitude: 1.125
Lead Channel Setting Pacing Amplitude: 3.5 V
Lead Channel Setting Pacing Pulse Width: 0.5 ms
Lead Channel Setting Sensing Sensitivity: 2 mV
Pulse Gen Model: 2272
Pulse Gen Serial Number: 5998043

## 2023-07-30 ENCOUNTER — Ambulatory Visit: Payer: Medicare Other | Admitting: Podiatry

## 2023-07-31 ENCOUNTER — Telehealth (HOSPITAL_COMMUNITY): Payer: Self-pay

## 2023-07-31 NOTE — Telephone Encounter (Signed)
 spoke with patient, patient unable to scheudle at this time due to transportation. - patient requested call back "later today" when his transportation may be home to discuss times and dates.   I attempted to assist patient with ride, working with Child psychotherapist to confirm information as they work with transportation more regularly than my self.  Called patient back asked patient if he was interested in working with social work team to try and get a ride for this appointment using [employer] [facility]. Patient declined stating "I don't want to get social services involved" - I tried to clarify that this is a Child psychotherapist who works with [employer], he still declined.

## 2023-08-04 ENCOUNTER — Ambulatory Visit: Payer: Medicare HMO | Admitting: Family

## 2023-08-06 ENCOUNTER — Ambulatory Visit: Payer: Medicare Other

## 2023-08-06 DIAGNOSIS — Z0000 Encounter for general adult medical examination without abnormal findings: Secondary | ICD-10-CM

## 2023-08-06 NOTE — Patient Instructions (Signed)
 Robert Ramirez , Thank you for taking time to come for your Medicare Wellness Visit. I appreciate your ongoing commitment to your health goals. Please review the following plan we discussed and let me know if I can assist you in the future.   Referrals/Orders/Follow-Ups/Clinician Recommendations: none  This is a list of the screening recommended for you and due dates:  Health Maintenance  Topic Date Due   COVID-19 Vaccine (4 - 2024-25 season) 01/25/2023   Yearly kidney health urinalysis for diabetes  08/04/2023   Eye exam for diabetics  10/03/2023   Hemoglobin A1C  12/10/2023   Yearly kidney function blood test for diabetes  06/08/2024   Complete foot exam   07/12/2024   Medicare Annual Wellness Visit  08/05/2024   DTaP/Tdap/Td vaccine (2 - Td or Tdap) 01/29/2031   Pneumonia Vaccine  Completed   Flu Shot  Completed   Hepatitis C Screening  Completed   Zoster (Shingles) Vaccine  Completed   HPV Vaccine  Aged Out    Advanced directives: (ACP Link)Information on Advanced Care Planning can be found at [facility] of [place] Advance Health Care Directives Advance Health Care Directives. [email or web address]   Next Medicare Annual Wellness Visit scheduled for next year: Yes  insert Preventive Care attachment Insert FALL PREVENTION attachment if needed

## 2023-08-06 NOTE — Progress Notes (Addendum)
 Subjective:   Robert Ramirez is a 78 y.o. who presents for a Medicare Wellness preventive visit.  Visit Complete: Virtual I connected with  [name] on 08/06/23 by a audio enabled telemedicine application and verified that I am speaking with the correct person using two identifiers.  Patient Location: Home  Provider Location: Home Office  I discussed the limitations of evaluation and management by telemedicine. The patient expressed understanding and agreed to proceed.  Vital Signs: Because this visit was a virtual/telehealth visit, some criteria may be missing or patient reported. Any vitals not documented were not able to be obtained and vitals that have been documented are patient reported.  VideoError- Librarian, academic were attempted between this provider and patient, however failed, due to patient having technical difficulties OR patient did not have access to video capability.  We continued and completed visit with audio only.   Persons Participating in Visit: n/a  AWV Questionnaire: No: Patient Medicare AWV questionnaire was not completed prior to this visit.  Cardiac Risk Factors include: advanced age (>20men, >72 women);diabetes mellitus;dyslipidemia;hypertension;male gender     Objective:    Today's Vitals   There is no height or weight on file to calculate BMI.     08/06/2023    3:04 PM 06/12/2023   10:51 AM 08/04/2022    2:24 PM 12/03/2020    2:14 PM 05/12/2018    8:49 PM 03/13/2018    5:22 AM 03/13/2018   12:43 AM  Advanced Directives  Does Patient Have a Medical Advance Directive? No No No No No No No  Would patient like information on creating a medical advance directive? No - Patient declined No - Patient declined Yes (Inpatient - patient defers creating a medical advance directive at this time - Information given) Yes (Inpatient - patient defers creating a medical advance directive at this time - Information given) No -  Patient declined No - Patient declined No - Patient declined    Current Medications (verified) Outpatient Encounter Medications as of 08/06/2023  Medication Sig   amLODipine (NORVASC) 10 MG tablet Take 1 tablet (10 mg total) by mouth daily.   aspirin EC 81 MG tablet Take 81 mg by mouth daily. Swallow whole.   atorvastatin (LIPITOR) 40 MG tablet Take 40 mg by mouth daily.   blood glucose meter kit and supplies Dispense based on patient and insurance preference. Use up to four times daily as directed. (FOR ICD-10 E10.9, E11.9).   chlorthalidone (HYGROTON) 25 MG tablet TAKE 1 TABLET EVERY DAY   Cholecalciferol (VITAMIN D-3) 25 MCG (1000 UT) CAPS Take 1 capsule by mouth every morning.   empagliflozin (JARDIANCE) 10 MG TABS tablet Take 1 tablet (10 mg total) by mouth daily before breakfast.   ezetimibe (ZETIA) 10 MG tablet Take 1 tablet (10 mg total) by mouth daily.   Insulin NPH, Human,, Isophane, (NOVOLIN N FLEXPEN) 100 UNIT/ML Kiwkpen Inject 33 Units into the skin 2 (two) times daily with a meal. (Patient taking differently: Inject 30-32 Units into the skin 2 (two) times daily with a meal.)   Insulin Pen Needle (DROPLET PEN NEEDLES) 31G X 8 MM MISC 1 each by Other route 2 (two) times daily with a meal.   levothyroxine (SYNTHROID) 50 MCG tablet Take 1 tablet (50 mcg total) by mouth daily.   lisinopril (ZESTRIL) 40 MG tablet TAKE 1 TABLET EVERY DAY   metFORMIN (GLUCOPHAGE) 1000 MG tablet Take 1 tablet (1,000 mg total) by mouth 2 (two) times daily  with a meal.   mupirocin ointment (BACTROBAN) 2 % Apply 1 Application topically 2 (two) times daily.   nitroGLYCERIN (NITROSTAT) 0.4 MG SL tablet Place 1 tablet (0.4 mg total) under the tongue every 5 (five) minutes as needed for chest pain.   spironolactone (ALDACTONE) 25 MG tablet TAKE 1 TABLET EVERY DAY   vitamin B-12 (CYANOCOBALAMIN) 500 MCG tablet Take 500 mcg by mouth daily.   doxycycline (VIBRA-TABS) 100 MG tablet Take 1 tablet (100 mg total) by  mouth 2 (two) times daily. (Patient not taking: Reported on 08/06/2023)   No facility-administered encounter medications on file as of 08/06/2023.    Allergies (verified) Patient has no known allergies.   History: Past Medical History:  Diagnosis Date   Diabetes mellitus    High cholesterol    Hypertension    Past Surgical History:  Procedure Laterality Date   APPENDECTOMY     PACEMAKER IMPLANT N/A 06/12/2023   Procedure: PACEMAKER IMPLANT;  Surgeon: [name], MD;  Location: [facility] INVASIVE CV LAB;  Service: Cardiovascular;  Laterality: N/A;   RIGHT/LEFT HEART CATH AND CORONARY ANGIOGRAPHY N/A 03/17/2018   Procedure: RIGHT/LEFT HEART CATH AND CORONARY ANGIOGRAPHY;  Surgeon: Orpah Cobb, MD;  Location: [facility] INVASIVE CV LAB;  Service: Cardiovascular;  Laterality: N/A;   Family History  Problem Relation Age of Onset   Heart failure Mother    Cancer Father    Diabetes Sister    Cancer Sister    Social History   Socioeconomic History   Marital status: Married    Spouse name: Not on file   Number of children: Not on file   Years of education: Not on file   Highest education level: Not on file  Occupational History   Not on file  Tobacco Use   Smoking status: Former    Current packs/day: 0.00    Types: Cigarettes    Quit date: 03/30/1980    Years since quitting: 43.3    Passive exposure: Never   Smokeless tobacco: Current    Types: Chew, Snuff  Vaping Use   Vaping status: Never Used  Substance and Sexual Activity   Alcohol use: No   Drug use: No   Sexual activity: Not Currently  Other Topics Concern   Not on file  Social History Narrative   Not on file   Social Drivers of Health   Financial Resource Strain: Low Risk  (08/06/2023)   Overall Financial Resource Strain (CARDIA)    Difficulty of Paying Living Expenses: Not hard at all  Food Insecurity: No Food Insecurity (08/06/2023)   Hunger Vital Sign    Worried About Running Out of Food in the Last Year: Never  true    Ran Out of Food in the Last Year: Never true  Transportation Needs: No Transportation Needs (08/06/2023)   PRAPARE - Administrator, Civil Service (Medical): No    Lack of Transportation (Non-Medical): No  Physical Activity: Sufficiently Active (08/06/2023)   Exercise Vital Sign    Days of Exercise per Week: 7 days    Minutes of Exercise per Session: 30 min  Stress: No Stress Concern Present (08/06/2023)   [employer] of Occupational Health - Occupational Stress Questionnaire    Feeling of Stress : Not at all  Social Connections: Moderately Isolated (08/06/2023)   Social Connection and Isolation Panel [NHANES]    Frequency of Communication with Friends and Family: More than three times a week    Frequency of Social Gatherings with Friends  and Family: More than three times a week    Attends Religious Services: Never    Active Member of Clubs or Organizations: No    Attends Banker Meetings: Never    Marital Status: Married    Tobacco Counseling Ready to quit: Not Answered Counseling given: Not Answered    Clinical Intake:  Pre-visit preparation completed: Yes  Pain : No/denies pain     Nutritional Risks: None Diabetes: Yes CBG done?: No Did pt. bring in CBG monitor from home?: No  How often do you need to have someone help you when you read instructions, pamphlets, or other written materials from your doctor or pharmacy?: 1 - Never  Interpreter Needed?: No  Information entered by :: NAllen LPN   Activities of Daily Living     08/06/2023    2:56 PM 06/12/2023    5:45 PM  In your present state of health, do you have any difficulty performing the following activities:  Hearing? 0   Vision? 1   Comment retinopathy   Difficulty concentrating or making decisions? 0   Walking or climbing stairs? 1   Comment SOB   Dressing or bathing? 0   Doing errands, shopping? 0 0  Preparing Food and eating ? N   Using the Toilet? N   In the  past six months, have you accidently leaked urine? Y   Do you have problems with loss of bowel control? N   Managing your Medications? N   Managing your Finances? N   Housekeeping or managing your Housekeeping? N     Patient Care Team: Rema Fendt, NP as PCP - General (Nurse Practitioner) Orbie Pyo, MD as PCP - Cardiology (Cardiology) [name], MD as PCP - Electrophysiology (Cardiology)  Indicate any recent Medical Services you may have received from other than [facility] providers in the past year (date may be approximate).     Assessment:   This is a routine wellness examination for Logen.  Hearing/Vision screen Hearing Screening - Comments:: Denies hearing issues Vision Screening - Comments:: Regular eye exams, [facility]   Goals Addressed             This Visit's Progress    Patient Stated       08/06/2023, keep living       Depression Screen     08/06/2023    3:07 PM 08/04/2022    2:24 PM 01/28/2022    1:32 PM 12/30/2021    2:27 PM 09/10/2021    9:52 AM 06/11/2021    8:46 AM 03/15/2021   10:24 AM  PHQ 2/9 Scores  PHQ - 2 Score 0 0 0 0 0 0 0  PHQ- 9 Score 0  0    0    Fall Risk     08/06/2023    3:05 PM 07/03/2023   11:02 AM 08/04/2022    2:24 PM 12/30/2021    2:26 PM 03/15/2021    9:51 AM  Fall Risk   Falls in the past year? 1 0 0 0 0  Comment slipped      Number falls in past yr: 0 0 0 0 0  Injury with Fall? 0 0 0 0 0  Risk for fall due to : Medication side effect No Fall Risks No Fall Risks No Fall Risks   Follow up Falls prevention discussed;Falls evaluation completed Falls evaluation completed Falls evaluation completed Falls evaluation completed     MEDICARE RISK AT HOME:  Medicare Risk at Home Any stairs in or around the home?: No If so, are there any without handrails?: No Home free of loose throw rugs in walkways, pet beds, electrical cords, etc?: Yes Adequate lighting in your home to reduce risk of falls?: Yes Life alert?:  No Use of a cane, walker or w/c?: No Grab bars in the bathroom?: Yes Shower chair or bench in shower?: No Elevated toilet seat or a handicapped toilet?: Yes  TIMED UP AND GO:  Was the test performed?  No  Cognitive Function: 6CIT completed    12/03/2020    2:15 PM  MMSE - Mini Mental State Exam  Orientation to time 5  Orientation to Place 5  Registration 3  Attention/ Calculation 5  Recall 3  Language- name 2 objects 2  Language- repeat 1  Language- follow 3 step command 3  Language- read & follow direction 1  Write a sentence 1  Copy design 1  Total score 30        08/06/2023    3:07 PM 08/04/2022    2:25 PM  6CIT Screen  What Year? 0 points 0 points  What month? 0 points 0 points  What time? 0 points 0 points  Count back from 20 0 points 0 points  Months in reverse 0 points 0 points  Repeat phrase 0 points 0 points  Total Score 0 points 0 points    Immunizations Immunization History  Administered Date(s) Administered   Fluad Trivalent(High Dose 65+) 07/03/2023   Influenza, High Dose Seasonal PF 04/20/2013, 03/14/2018, 03/01/2019   Influenza-Unspecified 04/20/2013, 04/20/2013   PFIZER(Purple Top)SARS-COV-2 Vaccination 12/12/2019, 01/03/2020   Pfizer Covid-19 Vaccine Bivalent Booster 86yrs & up 06/01/2021   Pneumococcal Conjugate-13 03/21/2009, 01/19/2015   Pneumococcal Polysaccharide-23 01/28/2021   Tdap 01/28/2021   Zoster Recombinant(Shingrix) 07/24/2020, 10/24/2020    Screening Tests Health Maintenance  Topic Date Due   COVID-19 Vaccine (4 - 2024-25 season) 01/25/2023   Diabetic kidney evaluation - Urine ACR  08/04/2023   OPHTHALMOLOGY EXAM  10/03/2023   HEMOGLOBIN A1C  12/10/2023   Diabetic kidney evaluation - eGFR measurement  06/08/2024   FOOT EXAM  07/12/2024   Medicare Annual Wellness (AWV)  08/05/2024   DTaP/Tdap/Td (2 - Td or Tdap) 01/29/2031   Pneumonia Vaccine 48+ Years old  Completed   INFLUENZA VACCINE  Completed   Hepatitis C  Screening  Completed   Zoster Vaccines- Shingrix  Completed   HPV VACCINES  Aged Out    Health Maintenance  Health Maintenance Due  Topic Date Due   COVID-19 Vaccine (4 - 2024-25 season) 01/25/2023   Diabetic kidney evaluation - Urine ACR  08/04/2023   Health Maintenance Items Addressed: Seen podiatrist on 07/13/2023. Diabetic urine due. Has appointment in April.   Additional Screening:  Vision Screening: Recommended annual ophthalmology exams for early detection of glaucoma and other disorders of the eye.  Dental Screening: Recommended annual dental exams for proper oral hygiene  Community Resource Referral / Chronic Care Management: CRR required this visit?  No   CCM required this visit?  No     Plan:     I have personally reviewed and noted the following in the patient's chart:   Medical and social history Use of alcohol, tobacco or illicit drugs  Current medications and supplements including opioid prescriptions. Patient is not currently taking opioid prescriptions. Functional ability and status Nutritional status Physical activity Advanced directives List of other physicians Hospitalizations, surgeries, and ER visits in previous 12  months Vitals Screenings to include cognitive, depression, and falls Referrals and appointments  In addition, I have reviewed and discussed with patient certain preventive protocols, quality metrics, and best practice recommendations. A written personalized care plan for preventive services as well as general preventive health recommendations were provided to patient.     Barb Merino, LPN   [id number]/[id number]   After Visit Summary: (Pick Up) Due to this being a telephonic visit, with patients personalized plan was offered to patient and patient has requested to Pick up at office.  Notes: Nothing significant to report at this time.

## 2023-08-28 ENCOUNTER — Other Ambulatory Visit (HOSPITAL_COMMUNITY): Payer: Self-pay

## 2023-08-28 ENCOUNTER — Telehealth: Payer: Self-pay | Admitting: Pharmacist

## 2023-08-28 ENCOUNTER — Telehealth: Payer: Self-pay | Admitting: Pharmacy Technician

## 2023-08-28 NOTE — Telephone Encounter (Signed)
 Patient Advocate Encounter   The patient was approved for a [employer] grant that will help cover the cost of JARDIANCE Total amount awarded, [id number].  Effective: 07/29/23 - 07/27/24   [id number] [id number] [employer] ID: [id number]   Pharmacy provided with approval and processing information. Patient informed via TELEPHONE   The copay was 47.00 now 0.00

## 2023-08-28 NOTE — Telephone Encounter (Signed)
 Call patient to remind about f/u lipid lab. Patient reports his pharmacy never filled Zetia for him so he was only taking Lipitor 40 mg daily. He will call his [facility] pharmacy to get the refill. He was also concern about the cost of Jardiance. We will route to our med assistance team to enroll pt in [facility].

## 2023-08-28 NOTE — Addendum Note (Signed)
 Addended by: Geralyn Flash D on: 08/28/2023 12:36 PM   Modules accepted: Orders

## 2023-08-28 NOTE — Progress Notes (Signed)
 Remote pacemaker transmission.

## 2023-09-16 NOTE — Progress Notes (Deleted)
 Electrophysiology Office Note:   Date:  09/16/2023  ID:  Stclair Szymborski, DOB [birth year], MRN [id number]  Primary Cardiologist: Kyra Phy, MD Primary Heart Failure: None Electrophysiologist: Ardeen Kohler, MD      History of Present Illness:   Robert Ramirez is a 78 y.o. male with h/o CAD, DM, HTN, HLD, aortic atherosclerosis, CKD who is being seen today for evaluation of complete heart block at the request of Valeda Garter, PA.  Patient has baseline right bundle branch block, left posterior fascicular block and first-degree AV delay.  Patient saw Dr. Lorie Rook on 05/07/23 for routine follow-up visit.  At that time EKG was performed which was concerning for atrial fibrillation so he was referred to A-fib clinic.  Scented to A-fib clinic on 05/28/2023 there was an EKG which was concerning for complete heart block.  He was then referred to EP for pacemaker evaluation.  He reports that he is asymptomatic but functional status is somewhat limited at baseline.  He has had dizziness for over 2 years and it does not appear to be worse as of late.  No syncopal episodes.  He has no new or acute complaints today.  Review of systems complete and found to be negative unless listed in HPI.   EP Information / Studies Reviewed:    EKG is ordered today. Personal review as below.      EKG 05/28/23: Sinus rhythm with high-grade AV block.  Right bundle branch block, left posterior fascicular block.  LHC/RHC 03/17/18: Mid RCA to Dist RCA lesion is 60% stenosed. LV end diastolic pressure is normal.   Medical treatment.   Recommend Aspirin  81mg  daily for moderate CAD.  Nuclear Stress 03/16/18: IMPRESSION: 1. Small region of anteroapical ischemia.  No definite infarct.   2. Mild left ventricular hypokinesis without LVE.   3. Left ventricular ejection fraction 47%   4. Non invasive risk stratification*: Intermediate  Echo 03/13/18: - Left ventricle: The cavity size was normal. Wall thickness was     normal. Systolic function was normal. The estimated ejection    fraction was in the range of 60% to 65%. Wall motion was normal;    there were no regional wall motion abnormalities. Doppler    parameters are consistent with abnormal left ventricular    relaxation (grade 1 diastolic dysfunction).  - Ventricular septum: D-shaped interventricular septum suggestive    of RV pressure/volume overload.  - Aortic valve: There was no stenosis.  - Aorta: Mildly dilated aortic root. Aortic root dimension: 41 mm    (ED).  - Mitral valve: Mildly calcified annulus. There was no significant    regurgitation.  - Right ventricle: The cavity size was mildly dilated. Systolic    function was mildly reduced.  - Right atrium: The atrium was mildly dilated.  - Pulmonary arteries: No complete TR doppler jet so unable to    estimate PA systolic pressure.  - Pericardium, extracardiac: A trivial pericardial effusion was    identified.    Physical Exam:   VS:  There were no vitals taken for this visit.   Wt Readings from Last 3 Encounters:  07/03/23 175 lb 9.6 oz (79.7 kg)  06/13/23 172 lb 9.9 oz (78.3 kg)  06/09/23 183 lb 9.6 oz (83.3 kg)     GEN: Well nourished, well developed in no acute distress NECK: No JVD; No carotid bruits CARDIAC: Bradycardic, irregular. RESPIRATORY:  Clear to auscultation without rales, wheezing or rhonchi  ABDOMEN: Soft, non-tender, non-distended EXTREMITIES:  No edema; No  deformity   ASSESSMENT AND PLAN:   Patient has extensive conduction disease at baseline.  Previous EKG showed right bundle branch block with left posterior fascicular block and first-degree AV delay.  His EKG from 05/07/2023 was not atrial fibrillation but sinus and either extremely long first-degree AV block or intermittent high-grade AV block.  His EKG from 05/28/2023 demonstrates baseline significant conduction disease.  There is grouped beating suggestive of Wenckebach conduction with extremely long AV  intervals.  This is redemonstrated on his EKG today, but also with concern for high-grade AV block.  Patient meets criteria for permanent pacemaker implant based on this.  We discussed going to the ED and having pacemaker implant in the next couple of days. Patient has decided to have this done as outpatient given that his EKGs are relatively unchanged over the past 6 weeks and that he is currently asymptomatic.  We have scheduled for expedited pacemaker implant on this upcoming Friday.  Extensive ED precautions were provided to the patient.  #.  High-grade AV block: #.  Trifascicular block: Patient meets criteria for permanent pacemaker implantation. Explained risks, benefits, and alternatives to pacemaker implantation, including but not limited to lead dislodgment, lead perforation, bleeding, infection, damage to heart or lungs, heart attack, stroke, or death.  Pt verbalized understanding and agrees to proceed. Echocardiogram to be done tomorrow.  And pacemaker implant to be done on Friday. Extensive ED precautions were provided to the patient.  Signed, Ardeen Kohler, MD

## 2023-09-17 ENCOUNTER — Ambulatory Visit: Payer: Medicare Other | Attending: Cardiology | Admitting: Cardiology

## 2023-09-30 ENCOUNTER — Encounter: Payer: Self-pay | Admitting: Family

## 2023-09-30 ENCOUNTER — Telehealth: Payer: Self-pay

## 2023-09-30 ENCOUNTER — Ambulatory Visit (INDEPENDENT_AMBULATORY_CARE_PROVIDER_SITE_OTHER): Payer: Medicare Other | Admitting: Family

## 2023-09-30 VITALS — BP 115/65 | HR 83 | Temp 97.7°F | Resp 18 | Ht 72.0 in | Wt 180.0 lb

## 2023-09-30 DIAGNOSIS — E039 Hypothyroidism, unspecified: Secondary | ICD-10-CM | POA: Diagnosis not present

## 2023-09-30 DIAGNOSIS — Z7984 Long term (current) use of oral hypoglycemic drugs: Secondary | ICD-10-CM | POA: Diagnosis not present

## 2023-09-30 DIAGNOSIS — Z794 Long term (current) use of insulin: Secondary | ICD-10-CM | POA: Diagnosis not present

## 2023-09-30 DIAGNOSIS — S51012A Laceration without foreign body of left elbow, initial encounter: Secondary | ICD-10-CM

## 2023-09-30 DIAGNOSIS — E1165 Type 2 diabetes mellitus with hyperglycemia: Secondary | ICD-10-CM | POA: Diagnosis not present

## 2023-09-30 LAB — POCT GLYCOSYLATED HEMOGLOBIN (HGB A1C): Hemoglobin A1C: 8 % — AB (ref 4.0–5.6)

## 2023-09-30 MED ORDER — MUPIROCIN CALCIUM 2 % EX CREA: 1.0000 | TOPICAL_CREAM | Freq: Two times a day (BID) | CUTANEOUS | 0 refills | Status: DC

## 2023-09-30 MED ORDER — LEVOTHYROXINE SODIUM 50 MCG PO TABS: 50.0000 ug | ORAL_TABLET | Freq: Every day | ORAL | 0 refills | Status: DC

## 2023-09-30 MED ORDER — NOVOLIN N FLEXPEN 100 UNIT/ML ~~LOC~~ SUPN: 33.0000 [IU] | PEN_INJECTOR | Freq: Two times a day (BID) | SUBCUTANEOUS | 3 refills | Status: DC

## 2023-09-30 MED ORDER — METFORMIN HCL 1000 MG PO TABS: 1000.0000 mg | ORAL_TABLET | Freq: Two times a day (BID) | ORAL | 2 refills | Status: DC

## 2023-09-30 NOTE — Telephone Encounter (Signed)
 Error

## 2023-09-30 NOTE — Progress Notes (Signed)
 Patient fell  today trying to get in examination chair.  I made PCP aware.  Patient has a skin tear on his left arm near elbow.  3 month follow up. Patient said he was out of thyroid  medication

## 2023-09-30 NOTE — Telephone Encounter (Signed)
 error

## 2023-09-30 NOTE — Telephone Encounter (Signed)
 Dr. Elvan Hamel or Van Gelinas please advise on alternative to Insulin  NPH, Human,, Isophane due to patient's health insurance will not cover cost. Thank you.

## 2023-09-30 NOTE — Progress Notes (Signed)
 Patient ID: Robert Ramirez, male    DOB: December 12, 1945  MRN: [id number]  CC: Chronic Conditions Follow-Up  Subjective: Robert Ramirez is a 78 y.o. male who presents for chronic conditions follow-up.  His concerns today include:  - Doing well on Metformin  and Insulin  NPH, Human, Isophane, no issues/concerns. He denies red flag symptoms associated with diabetes.  - Doing well on Levothyroxine , no issues/concerns. - During today's office visit (rooming process) patient states he "stepped back" and missed the exam table step. States he then "sat on the floor" and scraped his left elbow. He did not hit his head or lose consciousness. He denies red flag symptoms.  Patient Active Problem List   Diagnosis Date Noted   AV block 06/12/2023   Complete heart block (HCC) 05/28/2023   Poorly-controlled hypertension 04/05/2019   Conductive hearing loss 04/05/2019   Chewing tobacco use 04/05/2019   Bilateral impacted cerumen 04/05/2019   Primary hyperparathyroidism (HCC) 11/21/2018   Chronic respiratory failure with hypoxia and hypercapnia (HCC) 11/18/2018   Chronic obstructive asthma (with obstructive pulmonary disease) (HCC) 06/09/2018   Other secondary pulmonary hypertension (HCC) 06/09/2018   Healthcare maintenance 03/30/2018   Serum total bilirubin elevated 03/26/2018   Asbestos exposure 03/26/2018   Lesion of skin of scalp 03/26/2018   Coronary artery disease involving native coronary artery of native heart without angina pectoris 03/26/2018   Acquired hypothyroidism 03/26/2018   Pleural plaque 03/14/2018   Diabetes (HCC) 03/14/2018   Somnolence    Hyponatremia    Abnormal chest x-ray    Hypertension associated with diabetes (HCC) 07/18/2014   Mixed hyperlipidemia 07/18/2014   Diabetic retinopathy associated with type 2 diabetes mellitus (HCC) 07/18/2014     Current Outpatient Medications on File Prior to Visit  Medication Sig Dispense Refill   amLODipine  (NORVASC ) 10 MG tablet Take  1 tablet (10 mg total) by mouth daily. 30 tablet 2   aspirin  EC 81 MG tablet Take 81 mg by mouth daily. Swallow whole.     atorvastatin  (LIPITOR) 40 MG tablet Take 40 mg by mouth daily.     blood glucose meter kit and supplies Dispense based on patient and insurance preference. Use up to four times daily as directed. (FOR ICD-10 E10.9, E11.9). 1 each 0   chlorthalidone  (HYGROTON ) 25 MG tablet TAKE 1 TABLET EVERY DAY 90 tablet 3   Cholecalciferol (VITAMIN D-3) 25 MCG (1000 UT) CAPS Take 1 capsule by mouth every morning.     empagliflozin  (JARDIANCE ) 10 MG TABS tablet Take 1 tablet (10 mg total) by mouth daily before breakfast. 90 tablet 3   ezetimibe  (ZETIA ) 10 MG tablet Take 1 tablet (10 mg total) by mouth daily. 90 tablet 3   Insulin  Pen Needle (DROPLET PEN NEEDLES) 31G X 8 MM MISC 1 each by Other route 2 (two) times daily with a meal. 200 each 0   lisinopril  (ZESTRIL ) 40 MG tablet TAKE 1 TABLET EVERY DAY 90 tablet 3   mupirocin  ointment (BACTROBAN ) 2 % Apply 1 Application topically 2 (two) times daily. 30 g 2   nitroGLYCERIN  (NITROSTAT ) 0.4 MG SL tablet Place 1 tablet (0.4 mg total) under the tongue every 5 (five) minutes as needed for chest pain. 25 tablet 3   spironolactone  (ALDACTONE ) 25 MG tablet TAKE 1 TABLET EVERY DAY 90 tablet 3   vitamin B-12 (CYANOCOBALAMIN ) 500 MCG tablet Take 500 mcg by mouth daily.     doxycycline  (VIBRA -TABS) 100 MG tablet Take 1 tablet (100 mg total) by mouth 2 (  two) times daily. (Patient not taking: Reported on 08/06/2023) 20 tablet 0   No current facility-administered medications on file prior to visit.    No Known Allergies  Social History   Socioeconomic History   Marital status: Married    Spouse name: Not on file   Number of children: Not on file   Years of education: Not on file   Highest education level: Not on file  Occupational History   Not on file  Tobacco Use   Smoking status: Former    Current packs/day: 0.00    Types: Cigarettes     Quit date: 03/30/1980    Years since quitting: 43.5    Passive exposure: Never   Smokeless tobacco: Current    Types: Chew, Snuff  Vaping Use   Vaping status: Never Used  Substance and Sexual Activity   Alcohol use: No   Drug use: No   Sexual activity: Not Currently  Other Topics Concern   Not on file  Social History Narrative   Not on file   Social Drivers of Health   Financial Resource Strain: Low Risk  (08/06/2023)   Overall Financial Resource Strain (CARDIA)    Difficulty of Paying Living Expenses: Not hard at all  Food Insecurity: No Food Insecurity (08/06/2023)   Hunger Vital Sign    Worried About Running Out of Food in the Last Year: Never true    Ran Out of Food in the Last Year: Never true  Transportation Needs: No Transportation Needs (08/06/2023)   PRAPARE - Administrator, Civil Service (Medical): No    Lack of Transportation (Non-Medical): No  Physical Activity: Sufficiently Active (08/06/2023)   Exercise Vital Sign    Days of Exercise per Week: 7 days    Minutes of Exercise per Session: 30 min  Stress: No Stress Concern Present (08/06/2023)   [employer] of Occupational Health - Occupational Stress Questionnaire    Feeling of Stress : Not at all  Social Connections: Moderately Isolated (08/06/2023)   Social Connection and Isolation Panel [NHANES]    Frequency of Communication with Friends and Family: More than three times a week    Frequency of Social Gatherings with Friends and Family: More than three times a week    Attends Religious Services: Never    Database administrator or Organizations: No    Attends Banker Meetings: Never    Marital Status: Married  Catering manager Violence: Not At Risk (08/06/2023)   Humiliation, Afraid, Rape, and Kick questionnaire    Fear of Current or Ex-Partner: No    Emotionally Abused: No    Physically Abused: No    Sexually Abused: No    Family History  Problem Relation Age of Onset   Heart  failure Mother    Cancer Father    Diabetes Sister    Cancer Sister     Past Surgical History:  Procedure Laterality Date   APPENDECTOMY     PACEMAKER IMPLANT N/A 06/12/2023   Procedure: PACEMAKER IMPLANT;  Surgeon: Ardeen Kohler, MD;  Location: [facility];  Service: Cardiovascular;  Laterality: N/A;   RIGHT/LEFT HEART CATH AND CORONARY ANGIOGRAPHY N/A 03/17/2018   Procedure: RIGHT/LEFT HEART CATH AND CORONARY ANGIOGRAPHY;  Surgeon: Pasqual Bone, MD;  Location: [facility];  Service: Cardiovascular;  Laterality: N/A;    ROS: Review of Systems Negative except as stated above  PHYSICAL EXAM: BP 115/65   Pulse 83   Temp 97.7 F (  36.5 C) (Oral)   Resp 18   Ht 6' (1.829 m)   Wt 180 lb (81.6 kg)   SpO2 95%   BMI 24.41 kg/m   Physical Exam HENT:     Head: Normocephalic and atraumatic.     Nose: Nose normal.     Mouth/Throat:     Mouth: Mucous membranes are moist.     Pharynx: Oropharynx is clear.  Eyes:     Extraocular Movements: Extraocular movements intact.     Conjunctiva/sclera: Conjunctivae normal.     Pupils: Pupils are equal, round, and reactive to light.  Cardiovascular:     Rate and Rhythm: Normal rate and regular rhythm.     Pulses: Normal pulses.     Heart sounds: Normal heart sounds.  Pulmonary:     Effort: Pulmonary effort is normal.     Breath sounds: Normal breath sounds.  Musculoskeletal:        General: Normal range of motion.     Right shoulder: Normal.     Left shoulder: Normal.     Right upper arm: Normal.     Left upper arm: Normal.     Right elbow: Normal.     Left elbow: Normal.     Right forearm: Normal.     Left forearm: Normal.     Right wrist: Normal.     Left wrist: Normal.     Right hand: Normal.     Left hand: Normal.     Cervical back: Normal range of motion and neck supple.  Skin:    General: Skin is warm and dry.     Comments: Skin tear left elbow.  Neurological:     General: No focal deficit present.      Mental Status: He is alert and oriented to person, place, and time.  Psychiatric:        Mood and Affect: Mood normal.        Behavior: Behavior normal.     ASSESSMENT AND PLAN: 1. Type 2 diabetes mellitus with hyperglycemia, with long-term current use of insulin  (HCC) (Primary) - Continue Metformin  and Insulin  NPH, Human, Isophane and Metformin  as prescribed.  - Hemoglobin A1c result pending.  - Routine screening.  - Discussed the importance of healthy eating habits, low-carbohydrate diet, low-sugar diet, regular aerobic exercise (at least 150 minutes a week as tolerated) and medication compliance to achieve or maintain control of diabetes. Counseled on medication adherence/adverse effects.  - Follow-up with primary provider as scheduled. - HgB A1c - Microalbumin / creatinine urine ratio - metFORMIN  (GLUCOPHAGE ) 1000 MG tablet; Take 1 tablet (1,000 mg total) by mouth 2 (two) times daily with a meal.  Dispense: 60 tablet; Refill: 2 - Insulin  NPH, Human,, Isophane, (NOVOLIN  N FLEXPEN) 100 UNIT/ML Kiwkpen; Inject 33 Units into the skin 2 (two) times daily with a meal.  Dispense: 15 mL; Refill: 3  2. Hypothyroidism, unspecified type - Continue Levothyroxine  as prescribed. Counseled on medication adherence/adverse effects.  - Follow-up with primary provider in 3 months or sooner if needed. - levothyroxine  (SYNTHROID ) 50 MCG tablet; Take 1 tablet (50 mcg total) by mouth daily.  Dispense: 90 tablet; Refill: 0  3. Skin tear of left elbow without complication, initial encounter - Skin tear cleansed and bandage applied from Alona Jamaica, CMA. - Mupirocin  cream as prescribed. Counseled on medication adherence/adverse effects.  - Follow-up with primary provider as scheduled. - mupirocin  cream (BACTROBAN ) 2 %; Apply 1 Application topically 2 (two) times daily.  Dispense: 60  g; Refill: 0   Patient was given the opportunity to ask questions.  Patient verbalized understanding of the plan and was  able to repeat key elements of the plan. Patient was given clear instructions to go to Emergency Department or return to medical center if symptoms don't improve, worsen, or new problems develop.The patient verbalized understanding.   Orders Placed This Encounter  Procedures   Microalbumin / creatinine urine ratio   HgB A1c     Requested Prescriptions   Signed Prescriptions Disp Refills   metFORMIN  (GLUCOPHAGE ) 1000 MG tablet 60 tablet 2    Sig: Take 1 tablet (1,000 mg total) by mouth 2 (two) times daily with a meal.   levothyroxine  (SYNTHROID ) 50 MCG tablet 90 tablet 0    Sig: Take 1 tablet (50 mcg total) by mouth daily.   Insulin  NPH, Human,, Isophane, (NOVOLIN  N FLEXPEN) 100 UNIT/ML Kiwkpen 15 mL 3    Sig: Inject 33 Units into the skin 2 (two) times daily with a meal.   mupirocin  cream (BACTROBAN ) 2 % 60 g 0    Sig: Apply 1 Application topically 2 (two) times daily.    Return in about 3 months (around 12/31/2023) for Follow-Up or next available chronic conditions.  Senaida Dama, NP

## 2023-09-30 NOTE — Telephone Encounter (Signed)
 Copied from CRM [id number]. Topic: Clinical - Prescription Issue >> Sep 30, 2023  1:05 PM Lotus Round B wrote: Reason for CRM: [facility] pharmacy called in to let us  know that the patients insurance wont pay for the Insulin  NPH, Human,, Isophane, (NOVOLIN  N FLEXPEN) 100 UNIT/ML Kiwkpen and would have to change it . If any questions to give the pharmacy a call [phone] and speak to a pharmacist

## 2023-09-30 NOTE — Addendum Note (Signed)
 Addended by: Bernardine Bridegroom on: 09/30/2023 04:46 PM   Modules accepted: Orders

## 2023-10-02 ENCOUNTER — Other Ambulatory Visit: Payer: Self-pay | Admitting: Pharmacist

## 2023-10-02 MED ORDER — HUMULIN N KWIKPEN 100 UNIT/ML ~~LOC~~ SUPN: 33.0000 [IU] | PEN_INJECTOR | Freq: Two times a day (BID) | SUBCUTANEOUS | 1 refills | Status: AC

## 2023-10-02 NOTE — Telephone Encounter (Signed)
 Thank you :)

## 2023-10-16 ENCOUNTER — Ambulatory Visit: Attending: Cardiology | Admitting: Cardiology

## 2023-10-16 ENCOUNTER — Encounter: Payer: Self-pay | Admitting: Cardiology

## 2023-10-16 VITALS — BP 124/64 | HR 85 | Ht 74.5 in | Wt 174.6 lb

## 2023-10-16 DIAGNOSIS — R600 Localized edema: Secondary | ICD-10-CM | POA: Diagnosis not present

## 2023-10-16 DIAGNOSIS — Z950 Presence of cardiac pacemaker: Secondary | ICD-10-CM | POA: Diagnosis not present

## 2023-10-16 DIAGNOSIS — I152 Hypertension secondary to endocrine disorders: Secondary | ICD-10-CM

## 2023-10-16 DIAGNOSIS — I2510 Atherosclerotic heart disease of native coronary artery without angina pectoris: Secondary | ICD-10-CM | POA: Diagnosis not present

## 2023-10-16 DIAGNOSIS — I442 Atrioventricular block, complete: Secondary | ICD-10-CM

## 2023-10-16 DIAGNOSIS — E785 Hyperlipidemia, unspecified: Secondary | ICD-10-CM

## 2023-10-16 DIAGNOSIS — E1169 Type 2 diabetes mellitus with other specified complication: Secondary | ICD-10-CM

## 2023-10-16 DIAGNOSIS — E1159 Type 2 diabetes mellitus with other circulatory complications: Secondary | ICD-10-CM

## 2023-10-16 LAB — CUP PACEART INCLINIC DEVICE CHECK
Battery Remaining Longevity: 118 mo
Battery Voltage: 3.01 V
Brady Statistic RA Percent Paced: 2.6 %
Brady Statistic RV Percent Paced: 97 %
Date Time Interrogation Session: 20250523163557
Implantable Lead Connection Status: 753985
Implantable Lead Connection Status: 753985
Implantable Lead Implant Date: 20250117
Implantable Lead Implant Date: 20250117
Implantable Lead Location: 753859
Implantable Lead Location: 753860
Implantable Pulse Generator Implant Date: 20250117
Lead Channel Impedance Value: 462.5 Ohm
Lead Channel Impedance Value: 525 Ohm
Lead Channel Pacing Threshold Amplitude: 0.75 V
Lead Channel Pacing Threshold Amplitude: 0.75 V
Lead Channel Pacing Threshold Amplitude: 0.75 V
Lead Channel Pacing Threshold Amplitude: 0.75 V
Lead Channel Pacing Threshold Pulse Width: 0.5 ms
Lead Channel Pacing Threshold Pulse Width: 0.5 ms
Lead Channel Pacing Threshold Pulse Width: 0.5 ms
Lead Channel Pacing Threshold Pulse Width: 0.5 ms
Lead Channel Sensing Intrinsic Amplitude: 12 mV
Lead Channel Sensing Intrinsic Amplitude: 5 mV
Lead Channel Setting Pacing Amplitude: 0.875
Lead Channel Setting Pacing Amplitude: 2 V
Lead Channel Setting Pacing Pulse Width: 0.5 ms
Lead Channel Setting Sensing Sensitivity: 2 mV
Pulse Gen Model: 2272
Pulse Gen Serial Number: 5998043

## 2023-10-16 NOTE — Progress Notes (Signed)
 Electrophysiology Office Note:   Date:  10/16/2023  ID:  Robert Ramirez, DOB [birth year], MRN [id number]  Primary Cardiologist: Kyra Phy, MD Primary Heart Failure: None Electrophysiologist: Ardeen Kohler, MD      History of Present Illness:   Robert Ramirez is a 78 y.o. male with h/o CAD, DM, HTN, HLD, aortic atherosclerosis, and complete heart block s/p pacemaker on 06/12/23 who is being seen today for 90 day post implant follow up.  Patient has baseline right bundle branch block, left posterior fascicular block and first-degree AV delay.  Patient saw Dr. Lorie Rook on 05/07/23 for routine follow-up visit.  At that time EKG was performed which was concerning for atrial fibrillation so he was referred to A-fib clinic.  Scented to A-fib clinic on 05/28/2023 there was an EKG which was concerning for complete heart block.  He was then referred to EP for pacemaker evaluation.  He underwent dual chamber pacemaker implant on 06/12/23.  Discussed the use of AI scribe software for clinical note transcription with the patient, who gave verbal consent to proceed.  History of Present Illness He reports that he has done relatively well since his pacemaker implant.  He does not endorse a drastic change in symptoms pre and post implant.  He mentions a toenail injury that occurred on the day of the pacemaker procedure. The toenail was nearly detached and was accidentally removed while putting on hospital socks. The toenail has since healed up. His legs are less swollen after resuming Jardiance , which he had been off for a period. He is able to perform his daily activities and exercises as instructed, indicating that he is managing well with his current health regimen.  No new or acute complaints today.  Review of systems complete and found to be negative unless listed in HPI.   EP Information / Studies Reviewed:    EKG is ordered today. Personal review as below.  EKG Interpretation Date/Time:  Friday Oct 16 2023 14:42:24 EDT Ventricular Rate:  85 PR Interval:  180 QRS Duration:  148 QT Interval:  396 QTC Calculation: 471 R Axis:   108  Text Interpretation: Atrial-sensed ventricular-paced rhythm When compared with ECG of 13-Jun-2023 04:29, No significant change was found Confirmed by Ardeen Kohler [phone]) on 10/16/2023 9:52:00 PM   EKG 05/28/23: Sinus rhythm with high-grade AV block.  Right bundle branch block, left posterior fascicular block.  LHC/RHC 03/17/18: Mid RCA to Dist RCA lesion is 60% stenosed. LV end diastolic pressure is normal.   Medical treatment.   Recommend Aspirin  81mg  daily for moderate CAD.  Nuclear Stress 03/16/18: IMPRESSION: 1. Small region of anteroapical ischemia.  No definite infarct.   2. Mild left ventricular hypokinesis without LVE.   3. Left ventricular ejection fraction 47%   4. Non invasive risk stratification*: Intermediate  Echo 03/13/18: - Left ventricle: The cavity size was normal. Wall thickness was    normal. Systolic function was normal. The estimated ejection    fraction was in the range of 60% to 65%. Wall motion was normal;    there were no regional wall motion abnormalities. Doppler    parameters are consistent with abnormal left ventricular    relaxation (grade 1 diastolic dysfunction).  - Ventricular septum: D-shaped interventricular septum suggestive    of RV pressure/volume overload.  - Aortic valve: There was no stenosis.  - Aorta: Mildly dilated aortic root. Aortic root dimension: 41 mm    (ED).  - Mitral valve: Mildly calcified annulus. There was no significant  regurgitation.  - Right ventricle: The cavity size was mildly dilated. Systolic    function was mildly reduced.  - Right atrium: The atrium was mildly dilated.  - Pulmonary arteries: No complete TR doppler jet so unable to    estimate PA systolic pressure.  - Pericardium, extracardiac: A trivial pericardial effusion was    identified.    Physical Exam:    VS:  BP 124/64   Pulse 85   Ht 6' 2.5" (1.892 m)   Wt 174 lb 9.6 oz (79.2 kg)   SpO2 96%   BMI 22.12 kg/m    Wt Readings from Last 3 Encounters:  10/16/23 174 lb 9.6 oz (79.2 kg)  09/30/23 180 lb (81.6 kg)  07/03/23 175 lb 9.6 oz (79.7 kg)     GEN: Well nourished, well developed in no acute distress NECK: No JVD; No carotid bruits CARDIAC: Bradycardic, irregular. RESPIRATORY:  Clear to auscultation without rales, wheezing or rhonchi  ABDOMEN: Soft, non-tender, non-distended EXTREMITIES:  No edema; No deformity   ASSESSMENT AND PLAN:    #.  High-grade AV block status post dual-chamber pacemaker: -In clinic device interrogation was performed, appropriate device function and stable lead parameters. -Continue remote monitoring.  #. Hypertension -At goal today.  Recommend checking blood pressures 1-2 times per week at home and recording the values.  Recommend bringing these recordings to the primary care physician.  #. CAD: Denies chest pain.  # .HLD:  - Continue aspirin  81 mg once daily.  Continue atorvastatin  40 mg once daily.  Continue ezetimibe  10 mg once daily.  #. Lower extremity edema: Resolved since resuming Jardiance .  Follow up with Dr. Daneil Dunker in 12 months  Signed, Ardeen Kohler, MD

## 2023-10-16 NOTE — Patient Instructions (Signed)

## 2023-10-18 ENCOUNTER — Ambulatory Visit: Payer: Self-pay | Admitting: Cardiology

## 2023-11-05 ENCOUNTER — Telehealth: Payer: Self-pay

## 2023-11-05 ENCOUNTER — Other Ambulatory Visit (HOSPITAL_COMMUNITY): Payer: Self-pay

## 2023-11-05 MED ORDER — EZETIMIBE 10 MG PO TABS
10.0000 mg | ORAL_TABLET | Freq: Every day | ORAL | 3 refills | Status: AC
  Filled 2023-11-05: qty 90, 90d supply, fill #0

## 2023-11-05 NOTE — Addendum Note (Signed)
 Addended by: Tre Sanker K on: 11/05/2023 01:23 PM   Modules accepted: Orders

## 2023-11-05 NOTE — Telephone Encounter (Signed)
 Spoke to patient he still has notstarted taking Zetia . [facility] keep telling him they did not received the prescription. Patient is in agreement to send it [facility] for delivery

## 2023-11-05 NOTE — Telephone Encounter (Signed)
 Pharmacy Patient Advocate Encounter  Insurance verification completed.   The patient is insured through [employer] test claim for ZETIA . Currently a quantity of 30 is a 30 day supply and the co-pay is $0 . The current 30 OR 90 day co-pay is, $0.  No PA needed at this time.  This test claim was processed through [facility]- copay amounts may vary at other pharmacies due to pharmacy/plan contracts, or as the patient moves through the different stages of their insurance plan.

## 2023-11-06 ENCOUNTER — Encounter (HOSPITAL_BASED_OUTPATIENT_CLINIC_OR_DEPARTMENT_OTHER): Payer: Self-pay | Admitting: Primary Care

## 2023-11-06 ENCOUNTER — Ambulatory Visit (HOSPITAL_BASED_OUTPATIENT_CLINIC_OR_DEPARTMENT_OTHER): Admitting: Primary Care

## 2023-11-06 VITALS — BP 113/69 | HR 65 | Ht 74.5 in

## 2023-11-06 DIAGNOSIS — Z87891 Personal history of nicotine dependence: Secondary | ICD-10-CM

## 2023-11-06 DIAGNOSIS — J9611 Chronic respiratory failure with hypoxia: Secondary | ICD-10-CM | POA: Diagnosis not present

## 2023-11-06 DIAGNOSIS — J9612 Chronic respiratory failure with hypercapnia: Secondary | ICD-10-CM

## 2023-11-06 NOTE — Patient Instructions (Addendum)
-  CHRONIC RESPIRATORY FAILURE: Chronic respiratory failure means your lungs are not able to get enough oxygen into your blood or remove enough carbon dioxide from it. Your BiPAP machine, which helps you breathe better at night, stopped working about five to six weeks ago. We will order a replacement BiPAP machine with your previous settings (Max IPAP 11 cm H2O/ Min EPAP 7 cm H2O). If there are any issues getting the new machine, we may need to repeat a sleep study.  -ASTHMA: Asthma is a condition where your airways narrow and swell, making it hard to breathe. You are managing your asthma with an albuterol  inhaler, which you use two to three times a week, mainly in the morning for lung congestion. You prefer to avoid medication unless necessary.  -ASBESTOS-RELATED PLEURAL DISEASE: Asbestos-related pleural disease is a condition caused by exposure to asbestos, leading to changes in the lining of your lungs. You have pleural plaques, but there is no evidence of more serious lung disease or cancer. No further imaging is needed at this time.  INSTRUCTIONS: We will order a replacement BiPAP machine with your previous settings. If you experience any issues obtaining the new machine, please contact us  as we may need to repeat a sleep study.  Follow-up 3 months with Jerlene Moody NP or Dr. Villa Greaser- [facility]

## 2023-11-06 NOTE — Progress Notes (Signed)
 @Patient  ID: Robert Ramirez, male    DOB: [birth year], 78 y.o.   MRN: [id number]  Chief Complaint  Patient presents with   Establish Care    Sleep consult already on BIPAP    Referring provider: Senaida Dama, NP  HPI: 78 year old male, former smoker. PMH significant for AV heart block, CAD, HTN, pulmonary hypertension, chronic obstructive asthma, chronic respiratory failure, pleural plaque, hypothyroidism, diabetes. Patient of Dr. Villa Greaser, last seen on 06/09/2018.   11/06/2023 Discussed the use of AI scribe software for clinical note transcription with the patient, who gave verbal consent to proceed.  History of Present Illness   Robert Ramirez is a 78 year old male with chronic respiratory failure who presents with a malfunctioning BiPAP machine.  His BiPAP machine stopped working approximately five to six weeks ago after he accidentally hit a button or knob while replacing the water jacket. He contacted a service for calibration but was unable to provide the necessary settings. His previous settings were a max inspiratory pressure (IPAP) of 11 and a min expiratory pressure (EPAP) of 7. He was sleeping well with these settings.  He has a history of chronic respiratory failure and was previously on a Trilogy machine, which was discontinued due to cost, leading to a switch to BiPAP therapy in 2020. He underwent a BiPAP titration study in 2019, confirming optimal pressure settings of 11/7cm h20. Since the BiPAP stopped working, he has experienced increased fatigue and weakness, making it difficult to walk down the hallway. His sleep quality has deteriorated, with frequent awakenings and concerns about nasal congestion affecting his sleep.  He uses an albuterol  inhaler on an as-needed basis, approximately two to three times a week, primarily in the morning to relieve lung congestion. He has a history of asbestos exposure with pleural plaques noted on a CT scan in 2020. No persistent cough or  mucus production and reports stable weight over the past several months.  No alcohol use and he drives very little, never having fallen asleep while driving.     No Known Allergies  Immunization History  Administered Date(s) Administered   Fluad Trivalent(High Dose 65+) 07/03/2023   Influenza, High Dose Seasonal PF 04/20/2013, 03/14/2018, 03/01/2019   Influenza-Unspecified 04/20/2013, 04/20/2013   PFIZER(Purple Top)SARS-COV-2 Vaccination 12/12/2019, 01/03/2020   Pfizer Covid-19 Vaccine Bivalent Booster 77yrs & up 06/01/2021   Pneumococcal Conjugate-13 03/21/2009, 01/19/2015   Pneumococcal Polysaccharide-23 01/28/2021   Tdap 01/28/2021   Zoster Recombinant(Shingrix) 07/24/2020, 10/24/2020    Past Medical History:  Diagnosis Date   Diabetes mellitus    High cholesterol    Hypertension     Tobacco History: Social History   Tobacco Use  Smoking Status Former   Current packs/day: 0.00   Types: Cigarettes   Quit date: 03/30/1980   Years since quitting: 43.6   Passive exposure: Never  Smokeless Tobacco Current   Types: Chew, Snuff   Ready to quit: Not Answered Counseling given: Not Answered   Outpatient Medications Prior to Visit  Medication Sig Dispense Refill   amLODipine  (NORVASC ) 10 MG tablet Take 1 tablet (10 mg total) by mouth daily. 30 tablet 2   aspirin  EC 81 MG tablet Take 81 mg by mouth daily. Swallow whole.     atorvastatin  (LIPITOR) 40 MG tablet Take 40 mg by mouth daily.     blood glucose meter kit and supplies Dispense based on patient and insurance preference. Use up to four times daily as directed. (FOR ICD-10 E10.9, E11.9). 1 each  0   chlorthalidone  (HYGROTON ) 25 MG tablet TAKE 1 TABLET EVERY DAY 90 tablet 3   Cholecalciferol (VITAMIN D-3) 25 MCG (1000 UT) CAPS Take 1 capsule by mouth every morning.     doxycycline  (VIBRA -TABS) 100 MG tablet Take 1 tablet (100 mg total) by mouth 2 (two) times daily. 20 tablet 0   empagliflozin  (JARDIANCE ) 10 MG TABS tablet  Take 1 tablet (10 mg total) by mouth daily before breakfast. 90 tablet 3   ezetimibe  (ZETIA ) 10 MG tablet Take 1 tablet (10 mg total) by mouth daily. 90 tablet 3   Insulin  NPH, Human,, Isophane, (HUMULIN  N KWIKPEN) 100 UNIT/ML Kiwkpen Inject 33 Units into the skin in the morning and at bedtime. 60 mL 1   Insulin  Pen Needle (DROPLET PEN NEEDLES) 31G X 8 MM MISC 1 each by Other route 2 (two) times daily with a meal. 200 each 0   levothyroxine  (SYNTHROID ) 50 MCG tablet Take 1 tablet (50 mcg total) by mouth daily. 90 tablet 0   lisinopril  (ZESTRIL ) 40 MG tablet TAKE 1 TABLET EVERY DAY 90 tablet 3   metFORMIN  (GLUCOPHAGE ) 1000 MG tablet Take 1 tablet (1,000 mg total) by mouth 2 (two) times daily with a meal. 60 tablet 2   mupirocin  cream (BACTROBAN ) 2 % Apply 1 Application topically 2 (two) times daily. 60 g 0   mupirocin  ointment (BACTROBAN ) 2 % Apply 1 Application topically 2 (two) times daily. 30 g 2   nitroGLYCERIN  (NITROSTAT ) 0.4 MG SL tablet Place 1 tablet (0.4 mg total) under the tongue every 5 (five) minutes as needed for chest pain. 25 tablet 3   spironolactone  (ALDACTONE ) 25 MG tablet TAKE 1 TABLET EVERY DAY 90 tablet 3   vitamin B-12 (CYANOCOBALAMIN ) 500 MCG tablet Take 500 mcg by mouth daily.     No facility-administered medications prior to visit.      Review of Systems  Review of Systems  Constitutional:  Positive for fatigue.  HENT: Negative.    Respiratory: Negative.  Negative for cough and shortness of breath.   Psychiatric/Behavioral:  Positive for sleep disturbance.    Physical Exam  BP 113/69   Pulse 65   Ht 6' 2.5 (1.892 m)   SpO2 97%   BMI 22.12 kg/m  Physical Exam Constitutional:      Appearance: Normal appearance.  HENT:     Head: Normocephalic and atraumatic.   Cardiovascular:     Rate and Rhythm: Normal rate and regular rhythm.  Pulmonary:     Effort: Pulmonary effort is normal.     Breath sounds: Normal breath sounds.   Musculoskeletal:         General: Normal range of motion.   Skin:    General: Skin is warm and dry.   Neurological:     General: No focal deficit present.     Mental Status: He is alert and oriented to person, place, and time. Mental status is at baseline.   Psychiatric:        Mood and Affect: Mood normal.        Behavior: Behavior normal.        Thought Content: Thought content normal.        Judgment: Judgment normal.      Lab Results:  CBC    Component Value Date/Time   WBC 10.3 06/09/2023 1140   WBC 8.0 03/18/2018 0320   RBC 5.42 06/09/2023 1140   RBC 5.33 03/18/2018 0320   HGB 16.0 06/09/2023 1140   HCT  49.6 06/09/2023 1140   PLT 178 06/09/2023 1140   MCV 92 06/09/2023 1140   MCH 29.5 06/09/2023 1140   MCH 28.3 03/18/2018 0320   MCHC 32.3 06/09/2023 1140   MCHC 31.2 03/18/2018 0320   RDW 12.2 06/09/2023 1140   LYMPHSABS 1.7 06/09/2023 1140   MONOABS 0.8 03/13/2018 0057   EOSABS 0.2 06/09/2023 1140   BASOSABS 0.0 06/09/2023 1140    BMET    Component Value Date/Time   NA 140 06/09/2023 1140   K 4.6 06/09/2023 1140   CL 96 06/09/2023 1140   CO2 25 06/09/2023 1140   GLUCOSE 133 (H) 06/09/2023 1140   GLUCOSE 200 (H) 03/18/2018 0320   BUN 26 06/09/2023 1140   CREATININE 1.41 (H) 06/09/2023 1140   CALCIUM  10.9 (H) 06/09/2023 1140   GFRNONAA >60 03/18/2018 0320   GFRAA >60 03/18/2018 0320    BNP    Component Value Date/Time   BNP 34.2 03/14/2018 1614    ProBNP No results found for: PROBNP  Imaging: CUP PACEART INCLINIC DEVICE CHECK Result Date: 10/16/2023 Normal in-clinic dual chamber pacemaker check. Presenting Rhythm: AS/VP. Routine testing of thresholds, sensing, and impedance demonstrate stable parameters and no programming changes needed at this time. Several AMS episodes noted and reviewed with MD in room. Estimated longevity 9.9 years. Pt enrolled in remote follow-up.Alline Areas, BSN, RN Normal in-clinic dual chamber pacemaker check. Presenting Rhythm: AS/VP. Routine  testing of thresholds, sensing, and impedance demonstrate stable parameters. Several AMS episodes noted and reviewed with MD in room. Estimated longevity 9.9 years. Pt enrolled in remote follow-up. Atrial output changed and fixed to chronic value of 2.0V @ 0.5 mSDrew Brent Cambric, BSN, RN    Assessment & Plan:   1. Chronic respiratory failure with hypoxia and hypercapnia (HCC) (Primary) - Ambulatory Referral for DME  Assessment and Plan    Chronic respiratory failure Chronic respiratory failure managed with BiPAP therapy. The current BiPAP machine malfunctioned 5-6 weeks ago, resulting in increased fatigue and poor sleep quality. Previous settings were 11/ 7 cm H2O. He reports improved energy levels and sleep quality with BiPAP use. Nocturia is present, but no significant change in sleep pattern with BiPAP. He is not using supplemental oxygen. Insurance has changed to [employer], and the DME company is [employer]. - Order replacement BiPAP machine with previous settings (IPAP 11 cm H2O, EPAP 7 cm H2O) - Consider repeating a sleep study if issues arise with obtaining the machine  Asthma Asthma managed with as-needed albuterol  inhaler. He uses albuterol  2-3 times a week, primarily in the morning for lung congestion. No persistent cough or mucus production. He prefers to avoid medication unless necessary.  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 No evidence of interstitial lung disease or neoplasm. No further imaging follow-up needed at this time.  Antonio Baumgarten, NP 11/06/2023

## 2023-11-11 ENCOUNTER — Telehealth (HOSPITAL_BASED_OUTPATIENT_CLINIC_OR_DEPARTMENT_OTHER): Payer: Self-pay

## 2023-11-11 NOTE — Telephone Encounter (Signed)
 Copied from CRM [id number]. Topic: Clinical - Order For Equipment >> Nov 11, 2023 12:09 PM [name] wrote: Reason for CRM: Patient's step-son is calling to state that the DME company [employer] has not received an official signed prescription for the settings on BiPAP machine. They will not set the settings again until an official prescription is sent, despite the numbers being provided via AVS and previous rx.  Patient would like this prescription to be printed and available for pick-up. Please notify step-son Garrin Kirwan at [phone] when it is ready for pickup.

## 2023-12-07 ENCOUNTER — Inpatient Hospital Stay (HOSPITAL_COMMUNITY)

## 2023-12-07 ENCOUNTER — Other Ambulatory Visit: Payer: Self-pay

## 2023-12-07 ENCOUNTER — Ambulatory Visit

## 2023-12-07 ENCOUNTER — Emergency Department (HOSPITAL_COMMUNITY)

## 2023-12-07 ENCOUNTER — Encounter (HOSPITAL_COMMUNITY): Payer: Self-pay

## 2023-12-07 ENCOUNTER — Inpatient Hospital Stay (HOSPITAL_COMMUNITY)
Admission: EM | Admit: 2023-12-07 | Discharge: 2023-12-19 | DRG: 286 | Disposition: A | Discharge status: Home Health | Attending: Family Medicine | Admitting: Family Medicine

## 2023-12-07 DIAGNOSIS — L03119 Cellulitis of unspecified part of limb: Secondary | ICD-10-CM | POA: Diagnosis not present

## 2023-12-07 DIAGNOSIS — G928 Other toxic encephalopathy: Secondary | ICD-10-CM | POA: Diagnosis present

## 2023-12-07 DIAGNOSIS — Z515 Encounter for palliative care: Secondary | ICD-10-CM | POA: Diagnosis not present

## 2023-12-07 DIAGNOSIS — Z91199 Patient's noncompliance with other medical treatment and regimen due to unspecified reason: Secondary | ICD-10-CM

## 2023-12-07 DIAGNOSIS — N179 Acute kidney failure, unspecified: Secondary | ICD-10-CM | POA: Diagnosis present

## 2023-12-07 DIAGNOSIS — R4189 Other symptoms and signs involving cognitive functions and awareness: Secondary | ICD-10-CM | POA: Diagnosis present

## 2023-12-07 DIAGNOSIS — E1165 Type 2 diabetes mellitus with hyperglycemia: Secondary | ICD-10-CM | POA: Diagnosis present

## 2023-12-07 DIAGNOSIS — I2510 Atherosclerotic heart disease of native coronary artery without angina pectoris: Secondary | ICD-10-CM | POA: Diagnosis not present

## 2023-12-07 DIAGNOSIS — Z87891 Personal history of nicotine dependence: Secondary | ICD-10-CM

## 2023-12-07 DIAGNOSIS — I442 Atrioventricular block, complete: Secondary | ICD-10-CM | POA: Diagnosis present

## 2023-12-07 DIAGNOSIS — Z8249 Family history of ischemic heart disease and other diseases of the circulatory system: Secondary | ICD-10-CM

## 2023-12-07 DIAGNOSIS — J9622 Acute and chronic respiratory failure with hypercapnia: Secondary | ICD-10-CM | POA: Diagnosis present

## 2023-12-07 DIAGNOSIS — Z77090 Contact with and (suspected) exposure to asbestos: Secondary | ICD-10-CM | POA: Diagnosis present

## 2023-12-07 DIAGNOSIS — R579 Shock, unspecified: Secondary | ICD-10-CM | POA: Diagnosis not present

## 2023-12-07 DIAGNOSIS — I5082 Biventricular heart failure: Secondary | ICD-10-CM | POA: Diagnosis present

## 2023-12-07 DIAGNOSIS — I5033 Acute on chronic diastolic (congestive) heart failure: Secondary | ICD-10-CM | POA: Diagnosis present

## 2023-12-07 DIAGNOSIS — Z7982 Long term (current) use of aspirin: Secondary | ICD-10-CM

## 2023-12-07 DIAGNOSIS — E039 Hypothyroidism, unspecified: Secondary | ICD-10-CM | POA: Diagnosis present

## 2023-12-07 DIAGNOSIS — E1159 Type 2 diabetes mellitus with other circulatory complications: Secondary | ICD-10-CM | POA: Diagnosis not present

## 2023-12-07 DIAGNOSIS — I130 Hypertensive heart and chronic kidney disease with heart failure and stage 1 through stage 4 chronic kidney disease, or unspecified chronic kidney disease: Secondary | ICD-10-CM | POA: Diagnosis present

## 2023-12-07 DIAGNOSIS — I152 Hypertension secondary to endocrine disorders: Secondary | ICD-10-CM | POA: Diagnosis present

## 2023-12-07 DIAGNOSIS — Z833 Family history of diabetes mellitus: Secondary | ICD-10-CM

## 2023-12-07 DIAGNOSIS — G4733 Obstructive sleep apnea (adult) (pediatric): Secondary | ICD-10-CM | POA: Diagnosis present

## 2023-12-07 DIAGNOSIS — R34 Anuria and oliguria: Secondary | ICD-10-CM | POA: Diagnosis present

## 2023-12-07 DIAGNOSIS — N1831 Chronic kidney disease, stage 3a: Secondary | ICD-10-CM | POA: Diagnosis present

## 2023-12-07 DIAGNOSIS — Z794 Long term (current) use of insulin: Secondary | ICD-10-CM

## 2023-12-07 DIAGNOSIS — M7989 Other specified soft tissue disorders: Secondary | ICD-10-CM | POA: Diagnosis not present

## 2023-12-07 DIAGNOSIS — I2723 Pulmonary hypertension due to lung diseases and hypoxia: Secondary | ICD-10-CM | POA: Diagnosis present

## 2023-12-07 DIAGNOSIS — E8729 Other acidosis: Secondary | ICD-10-CM | POA: Diagnosis present

## 2023-12-07 DIAGNOSIS — A419 Sepsis, unspecified organism: Secondary | ICD-10-CM | POA: Diagnosis present

## 2023-12-07 DIAGNOSIS — K8020 Calculus of gallbladder without cholecystitis without obstruction: Secondary | ICD-10-CM | POA: Diagnosis present

## 2023-12-07 DIAGNOSIS — R570 Cardiogenic shock: Secondary | ICD-10-CM | POA: Diagnosis present

## 2023-12-07 DIAGNOSIS — I700 Atherosclerosis of aorta: Secondary | ICD-10-CM | POA: Diagnosis present

## 2023-12-07 DIAGNOSIS — N189 Chronic kidney disease, unspecified: Secondary | ICD-10-CM | POA: Diagnosis not present

## 2023-12-07 DIAGNOSIS — R350 Frequency of micturition: Secondary | ICD-10-CM | POA: Diagnosis present

## 2023-12-07 DIAGNOSIS — E119 Type 2 diabetes mellitus without complications: Secondary | ICD-10-CM | POA: Diagnosis not present

## 2023-12-07 DIAGNOSIS — R011 Cardiac murmur, unspecified: Secondary | ICD-10-CM | POA: Diagnosis present

## 2023-12-07 DIAGNOSIS — R531 Weakness: Principal | ICD-10-CM

## 2023-12-07 DIAGNOSIS — I4892 Unspecified atrial flutter: Secondary | ICD-10-CM | POA: Diagnosis present

## 2023-12-07 DIAGNOSIS — R17 Unspecified jaundice: Secondary | ICD-10-CM | POA: Diagnosis not present

## 2023-12-07 DIAGNOSIS — L03116 Cellulitis of left lower limb: Secondary | ICD-10-CM | POA: Diagnosis present

## 2023-12-07 DIAGNOSIS — I50813 Acute on chronic right heart failure: Secondary | ICD-10-CM | POA: Insufficient documentation

## 2023-12-07 DIAGNOSIS — I472 Ventricular tachycardia, unspecified: Secondary | ICD-10-CM | POA: Diagnosis present

## 2023-12-07 DIAGNOSIS — I4891 Unspecified atrial fibrillation: Secondary | ICD-10-CM | POA: Diagnosis present

## 2023-12-07 DIAGNOSIS — L03115 Cellulitis of right lower limb: Secondary | ICD-10-CM | POA: Diagnosis present

## 2023-12-07 DIAGNOSIS — F0390 Unspecified dementia without behavioral disturbance: Secondary | ICD-10-CM | POA: Diagnosis present

## 2023-12-07 DIAGNOSIS — E7800 Pure hypercholesterolemia, unspecified: Secondary | ICD-10-CM | POA: Diagnosis present

## 2023-12-07 DIAGNOSIS — E1122 Type 2 diabetes mellitus with diabetic chronic kidney disease: Secondary | ICD-10-CM | POA: Diagnosis present

## 2023-12-07 DIAGNOSIS — I493 Ventricular premature depolarization: Secondary | ICD-10-CM | POA: Diagnosis present

## 2023-12-07 DIAGNOSIS — R6510 Systemic inflammatory response syndrome (SIRS) of non-infectious origin without acute organ dysfunction: Secondary | ICD-10-CM | POA: Diagnosis present

## 2023-12-07 DIAGNOSIS — E43 Unspecified severe protein-calorie malnutrition: Secondary | ICD-10-CM | POA: Diagnosis present

## 2023-12-07 DIAGNOSIS — I50810 Right heart failure, unspecified: Secondary | ICD-10-CM | POA: Diagnosis not present

## 2023-12-07 DIAGNOSIS — I2721 Secondary pulmonary arterial hypertension: Secondary | ICD-10-CM | POA: Diagnosis not present

## 2023-12-07 DIAGNOSIS — Z950 Presence of cardiac pacemaker: Secondary | ICD-10-CM | POA: Diagnosis present

## 2023-12-07 DIAGNOSIS — R7989 Other specified abnormal findings of blood chemistry: Secondary | ICD-10-CM | POA: Diagnosis not present

## 2023-12-07 DIAGNOSIS — Z79899 Other long term (current) drug therapy: Secondary | ICD-10-CM

## 2023-12-07 DIAGNOSIS — I272 Pulmonary hypertension, unspecified: Secondary | ICD-10-CM | POA: Diagnosis not present

## 2023-12-07 DIAGNOSIS — N289 Disorder of kidney and ureter, unspecified: Secondary | ICD-10-CM

## 2023-12-07 DIAGNOSIS — J9692 Respiratory failure, unspecified with hypercapnia: Secondary | ICD-10-CM | POA: Diagnosis not present

## 2023-12-07 DIAGNOSIS — G9341 Metabolic encephalopathy: Secondary | ICD-10-CM | POA: Diagnosis not present

## 2023-12-07 DIAGNOSIS — I5030 Unspecified diastolic (congestive) heart failure: Secondary | ICD-10-CM | POA: Diagnosis present

## 2023-12-07 DIAGNOSIS — Z6821 Body mass index (BMI) 21.0-21.9, adult: Secondary | ICD-10-CM

## 2023-12-07 DIAGNOSIS — Z7984 Long term (current) use of oral hypoglycemic drugs: Secondary | ICD-10-CM

## 2023-12-07 DIAGNOSIS — Z79890 Hormone replacement therapy (postmenopausal): Secondary | ICD-10-CM

## 2023-12-07 DIAGNOSIS — L0390 Cellulitis, unspecified: Secondary | ICD-10-CM | POA: Clinically undetermined

## 2023-12-07 DIAGNOSIS — R1310 Dysphagia, unspecified: Secondary | ICD-10-CM | POA: Diagnosis present

## 2023-12-07 DIAGNOSIS — J9621 Acute and chronic respiratory failure with hypoxia: Secondary | ICD-10-CM | POA: Diagnosis present

## 2023-12-07 DIAGNOSIS — I2781 Cor pulmonale (chronic): Secondary | ICD-10-CM | POA: Diagnosis present

## 2023-12-07 DIAGNOSIS — Z7189 Other specified counseling: Secondary | ICD-10-CM | POA: Diagnosis not present

## 2023-12-07 DIAGNOSIS — I878 Other specified disorders of veins: Secondary | ICD-10-CM | POA: Diagnosis present

## 2023-12-07 DIAGNOSIS — I10 Essential (primary) hypertension: Secondary | ICD-10-CM | POA: Diagnosis not present

## 2023-12-07 DIAGNOSIS — E11649 Type 2 diabetes mellitus with hypoglycemia without coma: Secondary | ICD-10-CM | POA: Diagnosis not present

## 2023-12-07 LAB — COMPREHENSIVE METABOLIC PANEL WITH GFR
ALT: 16 U/L (ref 0–44)
AST: 28 U/L (ref 15–41)
Albumin: 3 g/dL — ABNORMAL LOW (ref 3.5–5.0)
Alkaline Phosphatase: 106 U/L (ref 38–126)
Anion gap: 11 (ref 5–15)
BUN: 32 mg/dL — ABNORMAL HIGH (ref 8–23)
CO2: 32 mmol/L (ref 22–32)
Calcium: 9.9 mg/dL (ref 8.9–10.3)
Chloride: 91 mmol/L — ABNORMAL LOW (ref 98–111)
Creatinine, Ser: 1.8 mg/dL — ABNORMAL HIGH (ref 0.61–1.24)
GFR, Estimated: 39 mL/min — ABNORMAL LOW (ref >60)
Glucose, Bld: 279 mg/dL — ABNORMAL HIGH (ref 70–99)
Potassium: 4.6 mmol/L (ref 3.5–5.1)
Sodium: 134 mmol/L — ABNORMAL LOW (ref 135–145)
Total Bilirubin: 2.5 mg/dL — ABNORMAL HIGH (ref 0.0–1.2)
Total Protein: 6.1 g/dL — ABNORMAL LOW (ref 6.5–8.1)

## 2023-12-07 LAB — CBC WITH DIFFERENTIAL/PLATELET
Abs Immature Granulocytes: 0.18 K/uL — ABNORMAL HIGH (ref 0.00–0.07)
Basophils Absolute: 0.1 K/uL (ref 0.0–0.1)
Basophils Relative: 0 %
Eosinophils Absolute: 0 K/uL (ref 0.0–0.5)
Eosinophils Relative: 0 %
HCT: 51.1 % (ref 39.0–52.0)
Hemoglobin: 16.4 g/dL (ref 13.0–17.0)
Immature Granulocytes: 1 %
Lymphocytes Relative: 3 %
Lymphs Abs: 0.6 K/uL — ABNORMAL LOW (ref 0.7–4.0)
MCH: 29.9 pg (ref 26.0–34.0)
MCHC: 32.1 g/dL (ref 30.0–36.0)
MCV: 93.1 fL (ref 80.0–100.0)
Monocytes Absolute: 1.3 K/uL — ABNORMAL HIGH (ref 0.1–1.0)
Monocytes Relative: 7 %
Neutro Abs: 16.1 K/uL — ABNORMAL HIGH (ref 1.7–7.7)
Neutrophils Relative %: 89 %
Platelets: 165 K/uL (ref 150–400)
RBC: 5.49 MIL/uL (ref 4.22–5.81)
RDW: 14.2 % (ref 11.5–15.5)
WBC: 18.2 K/uL — ABNORMAL HIGH (ref 4.0–10.5)
nRBC: 0 % (ref 0.0–0.2)

## 2023-12-07 LAB — ECHOCARDIOGRAM COMPLETE
Area-P 1/2: 3.72 cm2
Height: 73 in
S' Lateral: 3.2 cm
Weight: 2800 [oz_av]

## 2023-12-07 LAB — I-STAT CHEM 8, ED
BUN: 37 mg/dL — ABNORMAL HIGH (ref 8–23)
Calcium, Ion: 1.25 mmol/L (ref 1.15–1.40)
Chloride: 93 mmol/L — ABNORMAL LOW (ref 98–111)
Creatinine, Ser: 1.6 mg/dL — ABNORMAL HIGH (ref 0.61–1.24)
Glucose, Bld: 279 mg/dL — ABNORMAL HIGH (ref 70–99)
HCT: 50 % (ref 39.0–52.0)
Hemoglobin: 17 g/dL (ref 13.0–17.0)
Potassium: 4.8 mmol/L (ref 3.5–5.1)
Sodium: 135 mmol/L (ref 135–145)
TCO2: 32 mmol/L (ref 22–32)

## 2023-12-07 LAB — URINALYSIS, W/ REFLEX TO CULTURE (INFECTION SUSPECTED)
Bacteria, UA: NONE SEEN
Bilirubin Urine: NEGATIVE
Glucose, UA: >=500 mg/dL — AB
Hgb urine dipstick: NEGATIVE
Ketones, ur: NEGATIVE mg/dL
Leukocytes,Ua: NEGATIVE
Nitrite: NEGATIVE
Protein, ur: NEGATIVE mg/dL
Specific Gravity, Urine: 1.022 (ref 1.005–1.030)
pH: 5 (ref 5.0–8.0)

## 2023-12-07 LAB — CBG MONITORING, ED
Glucose-Capillary: 163 mg/dL — ABNORMAL HIGH (ref 70–99)
Glucose-Capillary: 166 mg/dL — ABNORMAL HIGH (ref 70–99)
Glucose-Capillary: 182 mg/dL — ABNORMAL HIGH (ref 70–99)
Glucose-Capillary: 184 mg/dL — ABNORMAL HIGH (ref 70–99)

## 2023-12-07 LAB — TROPONIN I (HIGH SENSITIVITY)
Troponin I (High Sensitivity): 238 ng/L (ref <18)
Troponin I (High Sensitivity): 215 ng/L (ref <18)

## 2023-12-07 LAB — BRAIN NATRIURETIC PEPTIDE: B Natriuretic Peptide: 1195.7 pg/mL — ABNORMAL HIGH (ref 0.0–100.0)

## 2023-12-07 LAB — TSH: TSH: 2.423 u[IU]/mL (ref 0.350–4.500)

## 2023-12-07 MED ORDER — FUROSEMIDE 10 MG/ML IJ SOLN
40.0000 mg | Freq: Two times a day (BID) | INTRAMUSCULAR | Status: DC
  Administered 2023-12-07 – 2023-12-08 (×2): 40 mg via INTRAVENOUS
  Filled 2023-12-07 (×2): qty 4

## 2023-12-07 MED ORDER — CEFAZOLIN SODIUM-DEXTROSE 2-4 GM/100ML-% IV SOLN
2.0000 g | Freq: Once | INTRAVENOUS | Status: AC
  Administered 2023-12-07: 2 g via INTRAVENOUS
  Filled 2023-12-07: qty 100

## 2023-12-07 MED ORDER — INSULIN NPH (HUMAN) (ISOPHANE) 100 UNIT/ML ~~LOC~~ SUSP
30.0000 [IU] | Freq: Two times a day (BID) | SUBCUTANEOUS | Status: DC
  Administered 2023-12-07: 30 [IU] via SUBCUTANEOUS
  Filled 2023-12-07: qty 10

## 2023-12-07 MED ORDER — ACETAMINOPHEN 650 MG RE SUPP: 650.0000 mg | Freq: Four times a day (QID) | RECTAL | Status: DC | PRN

## 2023-12-07 MED ORDER — CEFAZOLIN SODIUM-DEXTROSE 2-4 GM/100ML-% IV SOLN
2.0000 g | Freq: Three times a day (TID) | INTRAVENOUS | Status: DC
  Filled 2023-12-07: qty 100

## 2023-12-07 MED ORDER — ENOXAPARIN SODIUM 40 MG/0.4ML IJ SOSY
40.0000 mg | PREFILLED_SYRINGE | INTRAMUSCULAR | Status: DC
  Administered 2023-12-07 – 2023-12-18 (×12): 40 mg via SUBCUTANEOUS
  Filled 2023-12-07 (×12): qty 0.4

## 2023-12-07 MED ORDER — VANCOMYCIN HCL IN DEXTROSE 1-5 GM/200ML-% IV SOLN
1000.0000 mg | INTRAVENOUS | Status: DC
  Administered 2023-12-08: 1000 mg via INTRAVENOUS
  Filled 2023-12-07: qty 200

## 2023-12-07 MED ORDER — SODIUM CHLORIDE 0.9 % IV SOLN: 2.0000 g | Freq: Once | INTRAVENOUS | Status: DC

## 2023-12-07 MED ORDER — EZETIMIBE 10 MG PO TABS
10.0000 mg | ORAL_TABLET | Freq: Every day | ORAL | Status: DC
  Administered 2023-12-07 – 2023-12-09 (×3): 10 mg via ORAL
  Filled 2023-12-07 (×6): qty 1

## 2023-12-07 MED ORDER — INSULIN ASPART 100 UNIT/ML IJ SOLN
0.0000 [IU] | Freq: Three times a day (TID) | INTRAMUSCULAR | Status: DC
  Administered 2023-12-07: 5 [IU] via SUBCUTANEOUS
  Administered 2023-12-08: 2 [IU] via SUBCUTANEOUS
  Administered 2023-12-09: 1 [IU] via SUBCUTANEOUS
  Administered 2023-12-09: 2 [IU] via SUBCUTANEOUS

## 2023-12-07 MED ORDER — VANCOMYCIN HCL 1500 MG/300ML IV SOLN
1500.0000 mg | Freq: Once | INTRAVENOUS | Status: AC
  Administered 2023-12-07: 1500 mg via INTRAVENOUS
  Filled 2023-12-07: qty 300

## 2023-12-07 MED ORDER — ASPIRIN 81 MG PO TBEC
81.0000 mg | DELAYED_RELEASE_TABLET | Freq: Every day | ORAL | Status: DC
  Administered 2023-12-07 – 2023-12-09 (×3): 81 mg via ORAL
  Filled 2023-12-07 (×4): qty 1

## 2023-12-07 MED ORDER — SODIUM CHLORIDE 0.9% FLUSH
3.0000 mL | Freq: Two times a day (BID) | INTRAVENOUS | Status: DC
  Administered 2023-12-07 – 2023-12-19 (×25): 3 mL via INTRAVENOUS

## 2023-12-07 MED ORDER — ALBUTEROL SULFATE (2.5 MG/3ML) 0.083% IN NEBU: 2.5000 mg | INHALATION_SOLUTION | RESPIRATORY_TRACT | Status: DC | PRN

## 2023-12-07 MED ORDER — SODIUM CHLORIDE 0.9 % IV BOLUS
500.0000 mL | Freq: Once | INTRAVENOUS | Status: AC
  Administered 2023-12-07: 500 mL via INTRAVENOUS

## 2023-12-07 MED ORDER — ATORVASTATIN CALCIUM 40 MG PO TABS
40.0000 mg | ORAL_TABLET | Freq: Every day | ORAL | Status: DC
  Administered 2023-12-07 – 2023-12-09 (×3): 40 mg via ORAL
  Filled 2023-12-07 (×2): qty 4
  Filled 2023-12-07 (×2): qty 1

## 2023-12-07 MED ORDER — VANCOMYCIN HCL IN DEXTROSE 1-5 GM/200ML-% IV SOLN: 1000.0000 mg | Freq: Once | INTRAVENOUS | Status: DC

## 2023-12-07 MED ORDER — CEFAZOLIN SODIUM-DEXTROSE 2-4 GM/100ML-% IV SOLN: 2.0000 g | Freq: Three times a day (TID) | INTRAVENOUS | Status: DC

## 2023-12-07 MED ORDER — EMPAGLIFLOZIN 10 MG PO TABS: 10.0000 mg | ORAL_TABLET | Freq: Every day | ORAL | Status: DC

## 2023-12-07 MED ORDER — INSULIN ASPART 100 UNIT/ML IJ SOLN
0.0000 [IU] | Freq: Three times a day (TID) | INTRAMUSCULAR | Status: DC
  Administered 2023-12-07: 3 [IU] via SUBCUTANEOUS

## 2023-12-07 MED ORDER — SODIUM CHLORIDE 0.9 % IV SOLN
2.0000 g | Freq: Two times a day (BID) | INTRAVENOUS | Status: DC
  Administered 2023-12-07 – 2023-12-09 (×4): 2 g via INTRAVENOUS
  Filled 2023-12-07 (×4): qty 12.5

## 2023-12-07 MED ORDER — SODIUM CHLORIDE 0.9 % IV SOLN: INTRAVENOUS | Status: DC

## 2023-12-07 MED ORDER — ACETAMINOPHEN 325 MG PO TABS
650.0000 mg | ORAL_TABLET | Freq: Four times a day (QID) | ORAL | Status: DC | PRN
  Administered 2023-12-11: 650 mg via ORAL
  Filled 2023-12-07: qty 2

## 2023-12-07 MED ORDER — LEVOTHYROXINE SODIUM 25 MCG PO TABS
50.0000 ug | ORAL_TABLET | Freq: Every day | ORAL | Status: DC
  Administered 2023-12-07 – 2023-12-12 (×4): 50 ug via ORAL
  Filled 2023-12-07 (×2): qty 2
  Filled 2023-12-07: qty 1
  Filled 2023-12-07 (×2): qty 2
  Filled 2023-12-07: qty 1

## 2023-12-07 NOTE — Progress Notes (Signed)
  Echocardiogram 2D Echocardiogram has been performed.  Koleen KANDICE Popper, RDCS 12/07/2023, 3:45 PM

## 2023-12-07 NOTE — ED Notes (Signed)
 [facility] Jude pacemaker interrogated, verified by [employer]

## 2023-12-07 NOTE — H&P (Signed)
 History and Physical    Patient: Robert Ramirez FMW:[id number] DOB: 10-23-1945 DOA: 12/07/2023 DOS: the patient was seen and examined on 12/07/2023 PCP: Lorren Greig PARAS, NP  Patient coming from: Home via EMS  Chief Complaint:  Chief Complaint  Patient presents with   Weakness   HPI: Robert Ramirez is a 78 y.o. male with medical history significant of hypertension, hyperlipidemia, HFpEF, heart block s/p PPM, and diabetes mellitus type 2 presents with leg weakness and swelling.  He has experienced progressive leg weakness and swelling for a week and a half. He reports that taking Jardiance  daily usually keeps the swelling out of his feet and legs. His legs have a consistent red color, which he considers normal. He sometimes uses a cane for mobility but generally walks without assistive devices.  He has a history of diabetes and experiences shortness of breath regularly. He uses a BiPAP machine at night with settings of 11 and 4, though he is unsure of the order. He has a history of respiratory failure requiring an eight-day hospital stay, which led to the use of the BiPAP machine.  He sleeps in a reclining chair due to past breathing issues and reports limited endurance since the breathing incident, needing to walk slowly and rest frequently. His current condition with leg swelling has disrupted his usual activities.  On admission into the emergency department patient was noted to be afebrile with tachypnea, blood pressures noted to be soft 92/64 to 103/58.  Labs significant for BNP 1195.7, WBC 18.2, sodium 134, BUN 32, creatinine 1.8, and glucose 279.  Chest x-ray noted no acute abnormality.  And chronic calcified pleural plaque formation and calcified mediastinal and perihilar lymph nodes.  Patient had initially been given 500 mL bolus of IV fluids and subsequently Ancef .  Review of Systems: As mentioned in the history of present illness. All other systems reviewed and are negative. Past  Medical History:  Diagnosis Date   Diabetes mellitus    High cholesterol    Hypertension    Past Surgical History:  Procedure Laterality Date   APPENDECTOMY     PACEMAKER IMPLANT N/A 06/12/2023   Procedure: PACEMAKER IMPLANT;  Surgeon: Kennyth Chew, MD;  Location: [facility] INVASIVE CV LAB;  Service: Cardiovascular;  Laterality: N/A;   RIGHT/LEFT HEART CATH AND CORONARY ANGIOGRAPHY N/A 03/17/2018   Procedure: RIGHT/LEFT HEART CATH AND CORONARY ANGIOGRAPHY;  Surgeon: Claudene Pacific, MD;  Location: [facility] INVASIVE CV LAB;  Service: Cardiovascular;  Laterality: N/A;   Social History:  reports that he quit smoking about 43 years ago. His smoking use included cigarettes. He has never been exposed to tobacco smoke. His smokeless tobacco use includes chew and snuff. He reports that he does not drink alcohol and does not use drugs.  No Known Allergies  Family History  Problem Relation Age of Onset   Heart failure Mother    Cancer Father    Diabetes Sister    Cancer Sister     Prior to Admission medications   Medication Sig Start Date End Date Taking? Authorizing Provider  amLODipine  (NORVASC ) 10 MG tablet Take 1 tablet (10 mg total) by mouth daily. 08/04/22   Lorren Greig PARAS, NP  aspirin  EC 81 MG tablet Take 81 mg by mouth daily. Swallow whole.    [provider]  atorvastatin  (LIPITOR) 40 MG tablet Take 40 mg by mouth daily.    [provider]  blood glucose meter kit and supplies Dispense based on patient and insurance preference. Use up to  four times daily as directed. (FOR ICD-10 E10.9, E11.9). 02/08/22   Lorren Greig PARAS, NP  chlorthalidone  (HYGROTON ) 25 MG tablet TAKE 1 TABLET EVERY DAY 10/16/22   Thukkani, Arun K, MD  Cholecalciferol (VITAMIN D-3) 25 MCG (1000 UT) CAPS Take 1 capsule by mouth every morning.    [provider]  doxycycline  (VIBRA -TABS) 100 MG tablet Take 1 tablet (100 mg total) by mouth 2 (two) times daily. 07/13/23   Gershon Donnice SAUNDERS, DPM  empagliflozin   (JARDIANCE ) 10 MG TABS tablet Take 1 tablet (10 mg total) by mouth daily before breakfast. 05/07/23   Thukkani, Arun K, MD  ezetimibe  (ZETIA ) 10 MG tablet Take 1 tablet (10 mg total) by mouth daily. 11/05/23   Thukkani, Arun K, MD  Insulin  NPH, Human,, Isophane, (HUMULIN  N KWIKPEN) 100 UNIT/ML Kiwkpen Inject 33 Units into the skin in the morning and at bedtime. 10/02/23   Newlin, Enobong, MD  Insulin  Pen Needle (DROPLET PEN NEEDLES) 31G X 8 MM MISC 1 each by Other route 2 (two) times daily with a meal. 08/04/22   Lorren Greig PARAS, NP  levothyroxine  (SYNTHROID ) 50 MCG tablet Take 1 tablet (50 mcg total) by mouth daily. 09/30/23 12/29/23  Lorren Greig PARAS, NP  lisinopril  (ZESTRIL ) 40 MG tablet TAKE 1 TABLET EVERY DAY 10/16/22   Thukkani, Arun K, MD  metFORMIN  (GLUCOPHAGE ) 1000 MG tablet Take 1 tablet (1,000 mg total) by mouth 2 (two) times daily with a meal. 09/30/23 12/29/23  Lorren Greig PARAS, NP  mupirocin  cream (BACTROBAN ) 2 % Apply 1 Application topically 2 (two) times daily. 09/30/23   Lorren Greig PARAS, NP  mupirocin  ointment (BACTROBAN ) 2 % Apply 1 Application topically 2 (two) times daily. 07/13/23   Gershon Donnice SAUNDERS, DPM  nitroGLYCERIN  (NITROSTAT ) 0.4 MG SL tablet Place 1 tablet (0.4 mg total) under the tongue every 5 (five) minutes as needed for chest pain. 03/29/21   Thukkani, Arun K, MD  spironolactone  (ALDACTONE ) 25 MG tablet TAKE 1 TABLET EVERY DAY 03/12/23   Thukkani, Arun K, MD  vitamin B-12 (CYANOCOBALAMIN ) 500 MCG tablet Take 500 mcg by mouth daily.    [provider]    Physical Exam: Vitals:   12/07/23 0746 12/07/23 0800 12/07/23 0801 12/07/23 0806  BP:  100/66    Pulse:  94    Resp:  19    Temp:    98 F (36.7 C)  TempSrc:    Oral  SpO2:   91% 95%  Weight: 79.4 kg     Height: 6' 1 (1.854 m)      Constitutional: Elderly male currently in no acute distress Eyes: PERRL, lids and conjunctivae normal ENMT: Mucous membranes are moist. Posterior pharynx clear of any exudate or  lesions.Normal dentition.  Neck: normal, supple  Respiratory: clear to auscultation bilaterally, no wheezing, no crackles. Normal respiratory effort. No accessory muscle use.  Cardiovascular: Regular rate and rhythm, no murmurs / rubs / gallops.  At least +2 pitting bilateral lower extremity edema. Abdomen: no tenderness, no masses palpated.   Bowel sounds positive.  Musculoskeletal: no clubbing / cyanosis. No joint deformity upper and lower extremities. Good ROM, no contractures.  Skin: Erythema noted to bilateral lower extremities with weeping shallow ulcers noted on the right lower extremity. Neurologic: CN 2-12 grossly intact.  Patient able to move all extremities. Psychiatric: Normal judgment and insight. Alert and oriented x 3. Normal mood.   Data Reviewed:  EKG revealed normal sinus rhythm at 97 bpm with right bundle branch block.  Reviewed labs, imaging, and pertinent records as documented  Assessment and Plan:  Heart failure with preserved ejection fraction Patient presented with complaints of weakness and lower extremity swelling.  Found to have BNP elevated 1195.7.  Chest x-ray was otherwise noted to be clear.  Patient had initially been given 500 mL of normal saline IV fluids.  Last echocardiogram from 06/10/2023 noted EF to be 55 to 60% with indeterminate diastolic parameters at that time. - Admit to a cardiac telemetry bed - Strict I&Os and daily weights - Lasix  40 mg IV twice daily - Check echocardiogram - Cardiology consulted we will follow-up for any further recommend  Cellulitis versus venous stasis of the bilateral lower extremities Patient did have significant erythema bilateral lower extremities patient had been given Ancef  2 g IV x 1 dose.  Question possibility of cellulitis versus venous Acis. - Elevate lower extremities - Check ESR and CRP - Empiric antibiotics of vancomycin  and cefepime   Elevated troponin Acute.  Patient reported having significant fatigue and  shortness of breath with any kind of exertion.  High-sensitivity troponin elevated up to 238. - Trend high-sensitivity troponins - Follow-up echocardiogram  Leukocytosis SIRS Acute.  WBC elevated to 18.2.  Patient was noted to be tachycardic tachypneic as well meeting SIRS criteria. Thought secondary to above. -Add on lactic acid - Recheck CBC tomorrow morning  Uncontrolled diabetes mellitus type 2, with long-term use of insulin  On admission glucose elevated up to 279.  Last available hemoglobin A1c was noted to be 8.  Patient takes NPH 33 units twice daily - Hypoglycemic protocols - Held Jardiance .  Resume when medically appropriate - NPH 30 units twice daily - CBGs before every meal with a sensitive SSI - Adjust insulin  regimen as needed  Acute kidney injury superimposed on chronic kidney disease stage IIIa Patient presents with creatinine elevated up to 1.8 with BUN 32.  Baseline creatinine previously noted to be around 1.3-1.4.  Urinalysis did not show any signs for infection.  The secondary to hypoperfusion in setting of congestive heart failure. - Hold possible nephrotoxic agents - Continue to monitor kidney function with diuresis  Essential hypertension Blood pressures noted to be soft 92/64 to 118/59. - Initially held amlodipine , lisinopril , spironolactone , and chlorthalidone  due to soft blood pressures and AKI.  History of heart block S/p PPM - Interrogate pacemaker  Hypothyroidism - Add on TSH - Continue levothyroxine   Hyperlipidemia Continue atorvastatin  and zetia   OSA on BiPAP - Continue BiPAP nightly  DVT prophylaxis: Lovenox  Advance Care Planning:   Code Status: Full Code    Consults: Cardiology  Family Communication:   Severity of Illness: The appropriate patient status for this patient is INPATIENT. Inpatient status is judged to be reasonable and necessary in order to provide the required intensity of service to ensure the patient's safety. The  patient's presenting symptoms, physical exam findings, and initial radiographic and laboratory data in the context of their chronic comorbidities is felt to place them at high risk for further clinical deterioration. Furthermore, it is not anticipated that the patient will be medically stable for discharge from the hospital within 2 midnights of admission.   * I certify that at the point of admission it is my clinical judgment that the patient will require inpatient hospital care spanning beyond 2 midnights from the point of admission due to high intensity of service, high risk for further deterioration and high frequency of surveillance required.*  Author: Maximino DELENA Sharps, MD 12/07/2023 9:25 AM  For on call  review [email or web address].

## 2023-12-07 NOTE — ED Triage Notes (Signed)
 Pt BIB [facility] from home. C/O generalized weakness. Slid out of recliner which he sleeps in due to using Bipap. No LOC. Did not hit head. Hx of Afib and pacemaker. CBG 204. Pt not sure if he takes of blood thinners. Cellulitis of bilateral extremity, worse of last week, weeping.  EMS VS 100/50

## 2023-12-07 NOTE — Progress Notes (Signed)
 [facility] night cross cover note:   Per patient's request, I have placed order to resume his home nocturnal BiPAP. Will monitor ensuing BP response to this resumption.     Eva Pore, DO Hospitalist

## 2023-12-07 NOTE — ED Notes (Signed)
 Patient up to bedside voiding in urinal w/o difficulty. Incontinent of stool in brief. Peri-care provided

## 2023-12-07 NOTE — ED Provider Notes (Signed)
 [place] [facility]ERGENC[facility] DEPARTMENT AT [facility] Provider Note   CSN: [id number] Arrival date & time: 12/07/23  9260     Patient presents with: Weakness   Robert Ramirez is a 78 y.o. male.   78 year old male presents with whole body weakness times about 2 weeks.  Patient has noted increasing lower extremity edema without fever or chills.  Does use oxygen as needed.  States today he slid out of his chair and could not help himself up.  He called EMS due to this.  He denies any recent vomiting or diarrhea.  No recent fever or chills.  No worsening cough.  No urinary symptoms.  Denies any focality to his weakness.  No recent neck or back discomfort.  Upon EMS arrival, pulse ox was 92% and was placed on supplemental oxygen.  His CBG was 204.  Transported here for further evaluation       Prior to Admission medications   Medication Sig Start Date End Date Taking? Authorizing Provider  amLODipine  (NORVASC ) 10 MG tablet Take 1 tablet (10 mg total) by mouth daily. 08/04/22   Lorren Greig PARAS, NP  aspirin  EC 81 MG tablet Take 81 mg by mouth daily. Swallow whole.    [provider]  atorvastatin  (LIPITOR) 40 MG tablet Take 40 mg by mouth daily.    [provider]  blood glucose meter kit and supplies Dispense based on patient and insurance preference. Use up to four times daily as directed. (FOR ICD-10 E10.9, E11.9). 02/08/22   Lorren Greig PARAS, NP  chlorthalidone  (HYGROTON ) 25 MG tablet TAKE 1 TABLET EVERY DAY 10/16/22   Thukkani, Arun K, MD  Cholecalciferol (VITAMIN D-3) 25 MCG (1000 UT) CAPS Take 1 capsule by mouth every morning.    [provider]  doxycycline  (VIBRA -TABS) 100 MG tablet Take 1 tablet (100 mg total) by mouth 2 (two) times daily. 07/13/23   Gershon Donnice SAUNDERS, DPM  empagliflozin  (JARDIANCE ) 10 MG TABS tablet Take 1 tablet (10 mg total) by mouth daily before breakfast. 05/07/23   Thukkani, Arun K, MD  ezetimibe  (ZETIA ) 10 MG tablet Take 1 tablet (10  mg total) by mouth daily. 11/05/23   Thukkani, Arun K, MD  Insulin  NPH, Human,, Isophane, (HUMULIN  N KWIKPEN) 100 UNIT/ML Kiwkpen Inject 33 Units into the skin in the morning and at bedtime. 10/02/23   Newlin, Enobong, MD  Insulin  Pen Needle (DROPLET PEN NEEDLES) 31G X 8 MM MISC 1 each by Other route 2 (two) times daily with a meal. 08/04/22   Lorren Greig PARAS, NP  levothyroxine  (SYNTHROID ) 50 MCG tablet Take 1 tablet (50 mcg total) by mouth daily. 09/30/23 12/29/23  Lorren Greig PARAS, NP  lisinopril  (ZESTRIL ) 40 MG tablet TAKE 1 TABLET EVERY DAY 10/16/22   Thukkani, Arun K, MD  metFORMIN  (GLUCOPHAGE ) 1000 MG tablet Take 1 tablet (1,000 mg total) by mouth 2 (two) times daily with a meal. 09/30/23 12/29/23  Lorren Greig PARAS, NP  mupirocin  cream (BACTROBAN ) 2 % Apply 1 Application topically 2 (two) times daily. 09/30/23   Lorren Greig PARAS, NP  mupirocin  ointment (BACTROBAN ) 2 % Apply 1 Application topically 2 (two) times daily. 07/13/23   Gershon Donnice SAUNDERS, DPM  nitroGLYCERIN  (NITROSTAT ) 0.4 MG SL tablet Place 1 tablet (0.4 mg total) under the tongue every 5 (five) minutes as needed for chest pain. 03/29/21   Thukkani, Arun K, MD  spironolactone  (ALDACTONE ) 25 MG tablet TAKE 1 TABLET EVERY DAY 03/12/23   Thukkani, Arun K, MD  vitamin B-12 (CYANOCOBALAMIN ) 500 MCG tablet Take 500 mcg by mouth daily.    [provider]    Allergies: Patient has no known allergies.    Review of Systems  All other systems reviewed and are negative.   Updated Vital Signs Ht 1.854 m (6' 1)   Wt 79.4 kg   BMI 23.09 kg/m   Physical Exam Vitals and nursing note reviewed.  Constitutional:      General: He is not in acute distress.    Appearance: Normal appearance. He is well-developed. He is not toxic-appearing.  HENT:     Head: Normocephalic and atraumatic.  Eyes:     General: Lids are normal.     Conjunctiva/sclera: Conjunctivae normal.     Pupils: Pupils are equal, round, and reactive to light.  Neck:     Thyroid :  No thyroid  mass.     Trachea: No tracheal deviation.  Cardiovascular:     Rate and Rhythm: Normal rate and regular rhythm.     Heart sounds: Normal heart sounds. No murmur heard.    No gallop.  Pulmonary:     Effort: Pulmonary effort is normal. No respiratory distress.     Breath sounds: Normal breath sounds. No stridor. No decreased breath sounds, wheezing, rhonchi or rales.  Abdominal:     General: There is no distension.     Palpations: Abdomen is soft.     Tenderness: There is no abdominal tenderness. There is no rebound.  Musculoskeletal:        General: No tenderness. Normal range of motion.     Cervical back: Normal range of motion and neck supple.     Comments: 3+ bilateral lower extremity pitting edema with erythema noted from bilateral mid tibia down to the feet.  Compartments are soft at both lower extremities.  Patient has chronic neuropathy states that his decree sensations unchanged.  No severe cyanosis noted to the feet.  Skin:    General: Skin is warm and dry.     Findings: No abrasion or rash.  Neurological:     Mental Status: He is alert and oriented to person, place, and time. Mental status is at baseline.     GCS: GCS eye subscore is 4. GCS verbal subscore is 5. GCS motor subscore is 6.     Cranial Nerves: No cranial nerve deficit.     Sensory: No sensory deficit.     Motor: Motor function is intact.  Psychiatric:        Attention and Perception: Attention normal.        Speech: Speech normal.        Behavior: Behavior normal.     (all labs ordered are listed, but only abnormal results are displayed) Labs Reviewed  CBC WITH DIFFERENTIAL/PLATELET  COMPREHENSIVE METABOLIC PANEL WITH GFR  URINALYSIS, W/ REFLEX TO CULTURE (INFECTION SUSPECTED)  BRAIN NATRIURETIC PEPTIDE  I-STAT CHEM 8, ED    EKG: EKG Interpretation Date/Time:  Monday December 07 2023 07:53:25 EDT Ventricular Rate:  97 PR Interval:  187 QRS Duration:  142 QT Interval:  369 QTC  Calculation: 469 R Axis:   123  Text Interpretation: Sinus rhythm Right bundle branch block Probable anteroseptal infarct, recent No significant change since last tracing Confirmed by Dasie Faden ([id number]) on 12/07/2023 8:30:41 AM  Radiology: No results found.   Procedures   Medications Ordered in the ED - No data to display  Medical Decision Making Amount and/or Complexity of Data Reviewed Labs: ordered. Radiology: ordered. ECG/medicine tests: ordered.   Patient's EKG shows normal sinus rhythm with right bundle branch block which is unchanged from prior.  Does have some evidence of worsening chronic renal disease.  Chest x-ray did not show any evidence of CHF however his BNP is significant elevated.  Patient was initially given IV fluids until BMP resulted.  Fluids have been DC'd at this time.  He has had no chest pain but will add a troponin to his labs.  Concern for possible cellulitis of his lower extremities.  Will start IV antibiotics.  Due to patient's weakness he will require admission at this time.  Patient informed.  CRITICAL CARE Performed by: Curtistine ONEIDA Dawn Total critical care time: 45 minutes Critical care time was exclusive of separately billable procedures and treating other patients. Critical care was necessary to treat or prevent imminent or life-threatening deterioration. Critical care was time spent personally by me on the following activities: development of treatment plan with patient and/or surrogate as well as nursing, discussions with consultants, evaluation of patient's response to treatment, examination of patient, obtaining history from patient or surrogate, ordering and performing treatments and interventions, ordering and review of laboratory studies, ordering and review of radiographic studies, pulse oximetry and re-evaluation of patient's condition.      Final diagnoses:  None    ED Discharge Orders     None           Dawn Curtistine, MD 12/07/23 [phone]

## 2023-12-07 NOTE — Consult Note (Signed)
 Cardiology Consultation   Patient ID: Robert Ramirez MRN: [id number]; DOB: 1946/02/27  Admit date: 12/07/2023 Date of Consult: 12/07/2023  PCP:  Lorren Greig PARAS, NP   [facility] Providers Cardiologist:  Lurena MARLA Red, MD  Electrophysiologist:  Fonda Kitty, MD       Patient Profile: Robert Ramirez is a 78 y.o. male with a hx of CAD, diabetes, hypertension, hyperlipidemia, OSA on BiPAP, aortic atherosclerosis, and complete heart block status post DC PPM (05/2023) who is being seen 12/07/2023 for the evaluation of heart failure at the request of Dr. Claudene.  He presented to the ED noting weakness and LE edema.  He has been experiencing fatigue and increased leg swelling for about a week and a half. The swelling became more noticeable when he started wearing shorts due to hot weather. He also reports a lack of energy, particularly in his legs.  He has a history of respiratory issues, including respiratory failure approximately seven or eight years ago, requiring an eight-day hospitalization. He uses a BiPAP machine at night.. He did not use the BiPAP the previous night due to weakness and difficulty getting up from his recliner.  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  No recent changes in breathing, fevers, or chills, though he experienced shaking in his hands and legs the previous night. No cough or burning during urination, but he mentions frequent urination.  Family history is significant for his mother having died from congestive heart failure, which she referred to as 'dropsy'. She did not have any heart attacks but was on medications for heart failure.  In the ED he was hypotensive BP 90s-100s systolic.  BNP 1195.  No acute process on CHX.  He was given 500mL IV fluid and IV antibiotics.  He also noted exertioanl dyspnea.  Hs troponin elevated to 238.  +Leukocytosis with WBC  18.2.    He was subsequently started on IV lasix  and cardiology consulted.    Robert Ramirez saw Dr. Red 04/2023 and EKG was concerning for atrial fibrillation.  He was referred to A-fib clinic and EKG at that time was concerning for complete heart block.  He subsequently underwent dual-chamber pacemaker implantation 05/2023.  He had lower extremity edema that improved with initiation of Jardiance .  He saw Dr. Lavona 09/2023 and was doing well.  History of Present Illness: Robert Ramirez is 53M with   Past Medical History:  Diagnosis Date   Diabetes mellitus    High cholesterol    Hypertension     Past Surgical History:  Procedure Laterality Date   APPENDECTOMY     PACEMAKER IMPLANT N/A 06/12/2023   Procedure: PACEMAKER IMPLANT;  Surgeon: Kitty Fonda, MD;  Location: [facility] INVASIVE CV LAB;  Service: Cardiovascular;  Laterality: N/A;   RIGHT/LEFT HEART CATH AND CORONARY ANGIOGRAPHY N/A 03/17/2018   Procedure: RIGHT/LEFT HEART CATH AND CORONARY ANGIOGRAPHY;  Surgeon: Claudene Pacific, MD;  Location: [facility] INVASIVE CV LAB;  Service: Cardiovascular;  Laterality: N/A;     Home Medications:  Prior to Admission medications   Medication Sig Start Date End Date Taking? Authorizing Provider  amLODipine  (NORVASC ) 10 MG tablet Take 1 tablet (10 mg total) by mouth daily. 08/04/22  Yes Lorren Greig PARAS, NP  aspirin  EC 81 MG tablet Take 81 mg by mouth daily. Swallow whole.   Yes [provider]  atorvastatin  (LIPITOR) 40 MG tablet Take 40 mg by  mouth daily.   Yes [provider]  chlorthalidone  (HYGROTON ) 25 MG tablet TAKE 1 TABLET EVERY DAY 10/16/22  Yes Thukkani, Arun K, MD  Cholecalciferol (VITAMIN D-3) 25 MCG (1000 UT) CAPS Take 1 capsule by mouth every morning.   Yes [provider]  empagliflozin  (JARDIANCE ) 10 MG TABS tablet Take 1 tablet (10 mg total) by mouth daily before breakfast. 05/07/23  Yes Thukkani, Arun K, MD  ezetimibe  (ZETIA ) 10 MG tablet Take 1 tablet (10 mg  total) by mouth daily. 11/05/23  Yes Thukkani, Arun K, MD  Insulin  NPH, Human,, Isophane, (HUMULIN  N KWIKPEN) 100 UNIT/ML Kiwkpen Inject 33 Units into the skin in the morning and at bedtime. 10/02/23  Yes Newlin, Enobong, MD  levothyroxine  (SYNTHROID ) 50 MCG tablet Take 1 tablet (50 mcg total) by mouth daily. 09/30/23 12/29/23 Yes Lorren, Amy J, NP  lisinopril  (ZESTRIL ) 40 MG tablet TAKE 1 TABLET EVERY DAY 10/16/22  Yes Thukkani, Arun K, MD  metFORMIN  (GLUCOPHAGE ) 1000 MG tablet Take 1 tablet (1,000 mg total) by mouth 2 (two) times daily with a meal. 09/30/23 12/29/23 Yes Lorren, Amy J, NP  nitroGLYCERIN  (NITROSTAT ) 0.4 MG SL tablet Place 1 tablet (0.4 mg total) under the tongue every 5 (five) minutes as needed for chest pain. 03/29/21  Yes Thukkani, Arun K, MD  spironolactone  (ALDACTONE ) 25 MG tablet TAKE 1 TABLET EVERY DAY Patient taking differently: Take 12.5 mg by mouth daily. 03/12/23  Yes Thukkani, Arun K, MD  vitamin B-12 (CYANOCOBALAMIN ) 500 MCG tablet Take 500 mcg by mouth daily.   Yes [provider]  blood glucose meter kit and supplies Dispense based on patient and insurance preference. Use up to four times daily as directed. (FOR ICD-10 E10.9, E11.9). 02/08/22   Lorren Greig PARAS, NP  Insulin  Pen Needle (DROPLET PEN NEEDLES) 31G X 8 MM MISC 1 each by Other route 2 (two) times daily with a meal. 08/04/22   Lorren Greig PARAS, NP    Scheduled Meds:  enoxaparin  (LOVENOX ) injection  40 mg Subcutaneous Q24H   furosemide   40 mg Intravenous BID   insulin  aspart  0-15 Units Subcutaneous TID WC   levothyroxine   50 mcg Oral Q0600   sodium chloride  flush  3 mL Intravenous Q12H   Continuous Infusions:  PRN Meds: acetaminophen  **OR** acetaminophen , albuterol   Allergies:   No Known Allergies  Social History:   Social History   Socioeconomic History   Marital status: Married    Spouse name: Not on file   Number of children: Not on file   Years of education: Not on file   Highest education  level: Not on file  Occupational History   Not on file  Tobacco Use   Smoking status: Former    Current packs/day: 0.00    Types: Cigarettes    Quit date: 03/30/1980    Years since quitting: 43.7    Passive exposure: Never   Smokeless tobacco: Current    Types: Chew, Snuff  Vaping Use   Vaping status: Never Used  Substance and Sexual Activity   Alcohol use: No   Drug use: No   Sexual activity: Not Currently  Other Topics Concern   Not on file  Social History Narrative   Not on file   Social Drivers of Health   Financial Resource Strain: Low Risk  (08/06/2023)   Overall Financial Resource Strain (CARDIA)    Difficulty of Paying Living Expenses: Not hard at all  Food Insecurity: No Food Insecurity (08/06/2023)  Hunger Vital Sign    Worried About Running Out of Food in the Last Year: Never true    Ran Out of Food in the Last Year: Never true  Transportation Needs: No Transportation Needs (08/06/2023)   PRAPARE - Administrator, Civil Service (Medical): No    Lack of Transportation (Non-Medical): No  Physical Activity: Sufficiently Active (08/06/2023)   Exercise Vital Sign    Days of Exercise per Week: 7 days    Minutes of Exercise per Session: 30 min  Stress: No Stress Concern Present (08/06/2023)   [employer] of Occupational Health - Occupational Stress Questionnaire    Feeling of Stress : Not at all  Social Connections: Moderately Isolated (08/06/2023)   Social Connection and Isolation Panel    Frequency of Communication with Friends and Family: More than three times a week    Frequency of Social Gatherings with Friends and Family: More than three times a week    Attends Religious Services: Never    Database administrator or Organizations: No    Attends Banker Meetings: Never    Marital Status: Married  Catering manager Violence: Not At Risk (08/06/2023)   Humiliation, Afraid, Rape, and Kick questionnaire    Fear of Current or Ex-Partner:  No    Emotionally Abused: No    Physically Abused: No    Sexually Abused: No    Family History:    Family History  Problem Relation Age of Onset   Heart failure Mother    Cancer Father    Diabetes Sister    Cancer Sister      ROS:  Please see the history of present illness.  All other ROS reviewed and negative.     Physical Exam/Data: Vitals:   12/07/23 0931 12/07/23 1201 12/07/23 1245 12/07/23 1330  BP: (!) 103/58  99/63 116/74  Pulse: 96  86 (!) 105  Resp: 10  (!) 23 (!) 25  Temp:  98.3 F (36.8 C)    TempSrc:  Oral    SpO2: 99%  96% 96%  Weight:      Height:       No intake or output data in the 24 hours ending 12/07/23 1448    12/07/2023    7:46 AM 10/16/2023    2:43 PM 09/30/2023   10:49 AM  Last 3 Weights  Weight (lbs) 175 lb 174 lb 9.6 oz 180 lb  Weight (kg) 79.379 kg 79.198 kg 81.647 kg     VS:  BP (!) 118/59   Pulse (!) 107   Temp 98.4 F (36.9 C) (Oral)   Resp (!) 31   Ht 6' 1 (1.854 m)   Wt 79.4 kg   SpO2 91%   BMI 23.09 kg/m  , BMI Body mass index is 23.09 kg/m. GENERAL:  Well appearing HEENT: Pupils equal round and reactive, fundi not visualized, oral mucosa unremarkable NECK: Unable to assess for JVP 2/2 beard. Waveform within normal limits, carotid upstroke brisk and symmetric, no bruits, no thyromegaly LUNGS:  Clear to auscultation bilaterally HEART:  RRR.  PMI not displaced or sustained,S1 and S2 within normal limits, no S3, no S4, no clicks, no rubs, no murmurs ABD:  Flat, positive bowel sounds normal in frequency in pitch, no bruits, no rebound, no guarding, no midline pulsatile mass, no hepatomegaly, no splenomegaly EXT:  2 plus pulses throughout, 2+ LE edema, no cyanosis no clubbing SKIN:  Chronic stasis dermatitis.  NEURO:  Cranial nerves II  through XII grossly intact, motor grossly intact throughout [facility]:  Cognitively intact, oriented to person place and time   EKG:  The EKG was personally reviewed and demonstrates:  Sinus rhythm.  Rate 97 bpm.  RBBB.  Telemetry:  Telemetry was personally reviewed and demonstrates:  Sinus rhythm.  PVCs.   Relevant CV Studies: Echo 05/2023:  1. Left ventricular ejection fraction, by estimation, is 55 to 60%. The  left ventricle has normal function. The left ventricle has no regional  wall motion abnormalities. Left ventricular diastolic parameters are  indeterminate. The average left  ventricular global longitudinal strain is -14.4 %.   2. Right ventricular systolic function is low normal. The right  ventricular size is moderately enlarged. There is moderately elevated  pulmonary artery systolic pressure. The estimated right ventricular  systolic pressure is 59.8 mmHg.   3. Right atrial size was moderately dilated.   4. The mitral valve is normal in structure. Trivial mitral valve  regurgitation. No evidence of mitral stenosis.   5. The aortic valve is tricuspid. Aortic valve regurgitation is not  visualized. No aortic stenosis is present.   6. There is borderline dilatation of the aortic root, measuring 38 mm.   7. The inferior vena cava is normal in size with greater than 50%  respiratory variability, suggesting right atrial pressure of 3 mmHg.   Laboratory Data: High Sensitivity Troponin:   Recent Labs  Lab 12/07/23 0908 12/07/23 1108  TROPONINIHS 238* 215*     Chemistry Recent Labs  Lab 12/07/23 0758 12/07/23 0801  NA 135 134*  K 4.8 4.6  CL 93* 91*  CO2  --  32  GLUCOSE 279* 279*  BUN 37* 32*  CREATININE 1.60* 1.80*  CALCIUM   --  9.9  GFRNONAA  --  39*  ANIONGAP  --  11    Recent Labs  Lab 12/07/23 0801  PROT 6.1*  ALBUMIN 3.0*  AST 28  ALT 16  ALKPHOS 106  BILITOT 2.5*   Lipids No results for input(s): CHOL, TRIG, HDL, LABVLDL, LDLCALC, CHOLHDL in the last 168 hours.  Hematology Recent Labs  Lab 12/07/23 0758 12/07/23 0801  WBC  --  18.2*  RBC  --  5.49  HGB 17.0 16.4  HCT 50.0 51.1  MCV  --  93.1  MCH  --  29.9  MCHC  --   32.1  RDW  --  14.2  PLT  --  165   Thyroid   Recent Labs  Lab 12/07/23 1000  TSH 2.423    BNP Recent Labs  Lab 12/07/23 0816  BNP 1,195.7*    DDimer No results for input(s): DDIMER in the last 168 hours.  Radiology/Studies:  DG Chest Port 1 View Result Date: 12/07/2023 CLINICAL DATA:  Shortness of breath with generalized weakness. EXAM: PORTABLE CHEST 1 VIEW COMPARISON:  Radiographs 06/13/2023 and 01/08/2023. Chest CT 06/08/2018. FINDINGS: 0800 hours. Left subclavian pacemaker leads appear unchanged, projecting over the right atrium and right ventricle. The heart size and mediastinal contours are stable. Chronic calcified mediastinal and hilar lymph nodes and chronic calcified pleural plaque formation again noted bilaterally. No definite superimposed airspace disease, edema, pneumothorax or significant pleural effusion. There is probable mild bibasilar atelectasis. No significant osseous findings are identified. IMPRESSION: No definite acute cardiopulmonary process. Chronic calcified pleural plaque formation and calcified mediastinal and hilar lymph nodes. Electronically Signed   By: Elsie Perone M.D.   On: 12/07/2023 08:37     Assessment and Plan:  # Acute on  chronic HFpEF:  # RV failure: # Pulmomary hypertension:  Unlikely that this is related to chronic pacing. LV function appears normal on the echo which is being performed in the room now.  Chronic right-sided heart failure with recent exacerbation. Left heart function stable. History of respiratory failure and BiPAP use. Family history of congestive heart failure.  He had a RHC in 2019 that had normal PCWP and elevated PA pressure, suggesting underlying pulmonary hypertenison.  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 - Continue IV Lasix  to manage fluid overload and reduce edema. - Contiue Jardiance   # OSA:  Chronic BiPAP use for respiratory support effective  with clean filters. - Ensure regular maintenance and cleaning of BiPAP machine filters.  # Infection, unspecified Elevated white blood cell count suggests possible infection. No specific source identified. Shaking episodes noted. - Administer strong antibiotics to address potential infection. - Investigate further to identify the source of infection. - management per primary team   # CAD: Nonobstructive disease.  Continue atorvastatin  and Zetia .  # Hypertension: Blood pressure has been controlled on outpatient basis on amlodipine , chlorthalidone , and spironolactone .  Risk Assessment/Risk Scores:       [place]  Heart Association (NYHA) Functional Class NYHA Class III     For questions or updates, please contact [facility] Please consult [email or web address] for contact info under    Signed, Annabella Scarce, MD  12/07/2023 2:48 PM

## 2023-12-07 NOTE — Progress Notes (Signed)
 Pharmacy Antibiotic Note  Robert Ramirez is a 78 y.o. male admitted on 12/07/2023 with cellulitis.  Pharmacy has been consulted for vancomycin  dosing.  Plan: Vancomycin  1500 mg IV x 1, then 1g IV q 24h (eAUC 473) Monitor renal function, Cx and clinical progression to narrow Vancomycin  levels as indicated   Height: 6' 1 (185.4 cm) Weight: 79.4 kg (175 lb) IBW/kg (Calculated) : 79.9  Temp (24hrs), Avg:98.2 F (36.8 C), Min:98 F (36.7 C), Max:98.4 F (36.9 C)  Recent Labs  Lab 12/07/23 0758 12/07/23 0801  WBC  --  18.2*  CREATININE 1.60* 1.80*    Estimated Creatinine Clearance: 39.2 mL/min (A) (by C-G formula based on SCr of 1.8 mg/dL (H)).    No Known Allergies  Dorn Poot, PharmD, [facility] Clinical Pharmacist ED Pharmacist Phone # [phone] 12/07/2023 5:50 PM

## 2023-12-08 ENCOUNTER — Inpatient Hospital Stay (HOSPITAL_COMMUNITY)

## 2023-12-08 DIAGNOSIS — I5033 Acute on chronic diastolic (congestive) heart failure: Secondary | ICD-10-CM | POA: Diagnosis not present

## 2023-12-08 DIAGNOSIS — N1831 Chronic kidney disease, stage 3a: Secondary | ICD-10-CM

## 2023-12-08 DIAGNOSIS — R17 Unspecified jaundice: Secondary | ICD-10-CM

## 2023-12-08 DIAGNOSIS — L03115 Cellulitis of right lower limb: Secondary | ICD-10-CM | POA: Diagnosis not present

## 2023-12-08 DIAGNOSIS — I50810 Right heart failure, unspecified: Secondary | ICD-10-CM | POA: Diagnosis not present

## 2023-12-08 DIAGNOSIS — N179 Acute kidney failure, unspecified: Secondary | ICD-10-CM | POA: Diagnosis not present

## 2023-12-08 DIAGNOSIS — I2721 Secondary pulmonary arterial hypertension: Secondary | ICD-10-CM | POA: Diagnosis not present

## 2023-12-08 DIAGNOSIS — I50813 Acute on chronic right heart failure: Secondary | ICD-10-CM

## 2023-12-08 LAB — GLUCOSE, CAPILLARY
Glucose-Capillary: 66 mg/dL — ABNORMAL LOW (ref 70–99)
Glucose-Capillary: 67 mg/dL — ABNORMAL LOW (ref 70–99)
Glucose-Capillary: 110 mg/dL — ABNORMAL HIGH (ref 70–99)
Glucose-Capillary: 168 mg/dL — ABNORMAL HIGH (ref 70–99)
Glucose-Capillary: 242 mg/dL — ABNORMAL HIGH (ref 70–99)

## 2023-12-08 LAB — CUP PACEART REMOTE DEVICE CHECK
Battery Remaining Longevity: 115 mo
Battery Remaining Percentage: 95.5 %
Battery Voltage: 2.99 V
Brady Statistic AP VP Percent: 4.9 %
Brady Statistic AP VS Percent: 1 %
Brady Statistic AS VP Percent: 91 %
Brady Statistic AS VS Percent: 1.3 %
Brady Statistic RA Percent Paced: 2.4 %
Brady Statistic RV Percent Paced: 96 %
Date Time Interrogation Session: 20250714103758
Implantable Lead Connection Status: 753985
Implantable Lead Connection Status: 753985
Implantable Lead Implant Date: 20250117
Implantable Lead Implant Date: 20250117
Implantable Lead Location: 753859
Implantable Lead Location: 753860
Implantable Pulse Generator Implant Date: 20250117
Lead Channel Impedance Value: 430 Ohm
Lead Channel Impedance Value: 510 Ohm
Lead Channel Pacing Threshold Amplitude: 0.75 V
Lead Channel Pacing Threshold Amplitude: 0.75 V
Lead Channel Pacing Threshold Pulse Width: 0.5 ms
Lead Channel Pacing Threshold Pulse Width: 0.5 ms
Lead Channel Sensing Intrinsic Amplitude: 12 mV
Lead Channel Sensing Intrinsic Amplitude: 5 mV
Lead Channel Setting Pacing Amplitude: 1 V
Lead Channel Setting Pacing Amplitude: 2 V
Lead Channel Setting Pacing Pulse Width: 0.5 ms
Lead Channel Setting Sensing Sensitivity: 2 mV
Pulse Gen Model: 2272
Pulse Gen Serial Number: 5998043

## 2023-12-08 LAB — BASIC METABOLIC PANEL WITH GFR
Anion gap: 9 (ref 5–15)
BUN: 39 mg/dL — ABNORMAL HIGH (ref 8–23)
CO2: 33 mmol/L — ABNORMAL HIGH (ref 22–32)
Calcium: 9.8 mg/dL (ref 8.9–10.3)
Chloride: 96 mmol/L — ABNORMAL LOW (ref 98–111)
Creatinine, Ser: 1.67 mg/dL — ABNORMAL HIGH (ref 0.61–1.24)
GFR, Estimated: 42 mL/min — ABNORMAL LOW (ref >60)
Glucose, Bld: 113 mg/dL — ABNORMAL HIGH (ref 70–99)
Potassium: 3.9 mmol/L (ref 3.5–5.1)
Sodium: 138 mmol/L (ref 135–145)

## 2023-12-08 LAB — CBC
HCT: 46.4 % (ref 39.0–52.0)
Hemoglobin: 14.3 g/dL (ref 13.0–17.0)
MCH: 29.2 pg (ref 26.0–34.0)
MCHC: 30.8 g/dL (ref 30.0–36.0)
MCV: 94.7 fL (ref 80.0–100.0)
Platelets: 163 K/uL (ref 150–400)
RBC: 4.9 MIL/uL (ref 4.22–5.81)
RDW: 14.5 % (ref 11.5–15.5)
WBC: 16 K/uL — ABNORMAL HIGH (ref 4.0–10.5)
nRBC: 0 % (ref 0.0–0.2)

## 2023-12-08 LAB — CBG MONITORING, ED
Glucose-Capillary: 60 mg/dL — ABNORMAL LOW (ref 70–99)
Glucose-Capillary: 87 mg/dL (ref 70–99)

## 2023-12-08 LAB — LACTIC ACID, PLASMA: Lactic Acid, Venous: 0.9 mmol/L (ref 0.5–1.9)

## 2023-12-08 LAB — SEDIMENTATION RATE: Sed Rate: 9 mm/h (ref 0–16)

## 2023-12-08 LAB — C-REACTIVE PROTEIN: CRP: 19.5 mg/dL — ABNORMAL HIGH (ref <1.0)

## 2023-12-08 MED ORDER — LACTATED RINGERS IV BOLUS
500.0000 mL | Freq: Once | INTRAVENOUS | Status: AC
  Administered 2023-12-08: 500 mL via INTRAVENOUS

## 2023-12-08 MED ORDER — FUROSEMIDE 10 MG/ML IJ SOLN
80.0000 mg | Freq: Two times a day (BID) | INTRAMUSCULAR | Status: DC
  Administered 2023-12-08 – 2023-12-09 (×2): 80 mg via INTRAVENOUS
  Filled 2023-12-08 (×2): qty 8

## 2023-12-08 MED ORDER — LACTATED RINGERS IV BOLUS
250.0000 mL | Freq: Once | INTRAVENOUS | Status: AC
  Administered 2023-12-08: 250 mL via INTRAVENOUS

## 2023-12-08 MED ORDER — ENSURE PLUS HIGH PROTEIN PO LIQD
237.0000 mL | Freq: Two times a day (BID) | ORAL | Status: DC
  Administered 2023-12-08: 237 mL via ORAL

## 2023-12-08 MED ORDER — MIDODRINE HCL 5 MG PO TABS
5.0000 mg | ORAL_TABLET | Freq: Three times a day (TID) | ORAL | Status: DC
  Administered 2023-12-08: 5 mg via ORAL
  Filled 2023-12-08: qty 1

## 2023-12-08 MED ORDER — SODIUM CHLORIDE 0.9 % IV SOLN: INTRAVENOUS | Status: DC

## 2023-12-08 NOTE — Progress Notes (Signed)
 Heart Failure Navigator Progress Note  Assessed for Heart & Vascular TOC clinic readiness.  Patient does not meet criteria due to Advanced heart Failure team consulted. No HF TOC. .   Navigator will sign off at this time.   Stephane Haddock, BSN, Scientist, clinical (histocompatibility and immunogenetics) Only

## 2023-12-08 NOTE — ED Notes (Signed)
 Howerter MD notified of BP 75/47 MAP 58. Pt asymptomatic, new orders placed by MD.

## 2023-12-08 NOTE — ED Notes (Signed)
 Howerter MD notified of pt w/ low BP's. Per MD, notify when pts MAP is sustaining below 65.

## 2023-12-08 NOTE — Progress Notes (Signed)
 Rounding Note   Patient Name: Robert Ramirez Date of Encounter: 12/08/2023  [facility] Cardiologist: Arun K Thukkani, MD   Subjective Feeling a little better.  Bottom is sore. No orthopnea or PND.   Scheduled Meds:  aspirin  EC  81 mg Oral Daily   atorvastatin   40 mg Oral Daily   enoxaparin  (LOVENOX ) injection  40 mg Subcutaneous Q24H   ezetimibe   10 mg Oral Daily   furosemide   40 mg Intravenous BID   insulin  aspart  0-9 Units Subcutaneous TID WC   insulin  NPH Human  30 Units Subcutaneous BID AC & HS   levothyroxine   50 mcg Oral Q0600   sodium chloride  flush  3 mL Intravenous Q12H   Continuous Infusions:  ceFEPime  (MAXIPIME ) IV Stopped (12/08/23 9367)   vancomycin      PRN Meds: acetaminophen  **OR** acetaminophen , albuterol    Vital Signs  Vitals:   12/08/23 0615 12/08/23 0745 12/08/23 0750 12/08/23 0836  BP: (!) 87/52 (!) 80/49  95/80  Pulse: 83  79 76  Resp: 14  19 (!) 22  Temp:      TempSrc:      SpO2: 96%  96% 98%  Weight:      Height:        Intake/Output Summary (Last 24 hours) at 12/08/2023 0842 Last data filed at 12/08/2023 0810 Gross per 24 hour  Intake 1250.31 ml  Output 950 ml  Net 300.31 ml      12/07/2023    7:46 AM 10/16/2023    2:43 PM 09/30/2023   10:49 AM  Last 3 Weights  Weight (lbs) 175 lb 174 lb 9.6 oz 180 lb  Weight (kg) 79.379 kg 79.198 kg 81.647 kg      Telemetry Sinus rhythm.  PVCs.  VP - Personally Reviewed  ECG   12/07/23: Sinus rhythm.  Rate 97 bpm.  VP.  - Personally Reviewed  Physical Exam  VS:  BP 92/71 (BP Location: Right Arm)   Pulse 82   Temp 97.9 F (36.6 C) (Oral)   Resp 18   Ht 6' 1 (1.854 m)   Wt 79.4 kg   SpO2 97%   BMI 23.09 kg/m  , BMI Body mass index is 23.09 kg/m. GENERAL:  Well appearing HEENT: Pupils equal round and reactive, fundi not visualized, oral mucosa unremarkable NECK:  +jugular venous distention, waveform within normal limits, carotid upstroke brisk and symmetric, no bruits, no  thyromegaly LUNGS:  Clear to auscultation bilaterally HEART:  RRR.  PMI not displaced or sustained,S1 and S2 within normal limits, no S3, no S4, no clicks, no rubs, no murmurs ABD:  Flat, positive bowel sounds normal in frequency in pitch, no bruits, no rebound, no guarding, no midline pulsatile mass, no hepatomegaly, no splenomegaly EXT:  2 plus pulses throughout, 1+ LE edema, no cyanosis no clubbing SKIN:  Bilateral LE erythema to upper tibia NEURO:  Cranial nerves II through XII grossly intact, motor grossly intact throughout PSYCH:  Cognitively intact, oriented to person place and time   Labs High Sensitivity Troponin:   Recent Labs  Lab 12/07/23 0908 12/07/23 1108  TROPONINIHS 238* 215*     Chemistry Recent Labs  Lab 12/07/23 0758 12/07/23 0801 12/08/23 0401  NA 135 134* 138  K 4.8 4.6 3.9  CL 93* 91* 96*  CO2  --  32 33*  GLUCOSE 279* 279* 113*  BUN 37* 32* 39*  CREATININE 1.60* 1.80* 1.67*  CALCIUM   --  9.9 9.8  PROT  --  6.1*  --   ALBUMIN  --  3.0*  --   AST  --  28  --   ALT  --  16  --   ALKPHOS  --  106  --   BILITOT  --  2.5*  --   GFRNONAA  --  39* 42*  ANIONGAP  --  11 9    Lipids No results for input(s): CHOL, TRIG, HDL, LABVLDL, LDLCALC, CHOLHDL in the last 168 hours.  Hematology Recent Labs  Lab 12/07/23 0758 12/07/23 0801 12/08/23 0401  WBC  --  18.2* 16.0*  RBC  --  5.49 4.90  HGB 17.0 16.4 14.3  HCT 50.0 51.1 46.4  MCV  --  93.1 94.7  MCH  --  29.9 29.2  MCHC  --  32.1 30.8  RDW  --  14.2 14.5  PLT  --  165 163   Thyroid   Recent Labs  Lab 12/07/23 1000  TSH 2.423    BNP Recent Labs  Lab 12/07/23 0816  BNP 1,195.7*    DDimer No results for input(s): DDIMER in the last 168 hours.   Radiology  ECHOCARDIOGRAM COMPLETE Result Date: 12/07/2023    ECHOCARDIOGRAM REPORT   Patient Name:   SKYLER CAREL Date of Exam: 12/07/2023 Medical Rec #:  [id number]       Height:       73.0 in Accession #:    [id number]       Weight:       175.0 lb Date of Birth:  February 02, 1946        BSA:          2.033 m Patient Age:    77 years        BP:           116/74 mmHg Patient Gender: M               HR:           88 bpm. Exam Location:  Inpatient Procedure: 2D Echo, Cardiac Doppler and Color Doppler (Both Spectral and Color            Flow Doppler were utilized during procedure). Indications:    Congestive Heart Failure I50.9  History:        Patient has prior history of Echocardiogram examinations, most                 recent 06/10/2023. HFpEF, CAD, COPD; Risk Factors:Hypertension,                 Diabetes, Former Smoker and Dyslipidemia.  Sonographer:    Koleen Popper RDCS Referring Phys: [id number] RONDELL A SMITH  Sonographer Comments: Suboptimal apical window. Image acquisition challenging due to respiratory motion. IMPRESSIONS  1. Left ventricular ejection fraction, by estimation, is 60 to 65%. The left ventricle has normal function. The left ventricle has no regional wall motion abnormalities. There is mild left ventricular hypertrophy. Left ventricular diastolic parameters were normal. There is the interventricular septum is flattened in systole and diastole, consistent with right ventricular pressure and volume overload.  2. Right ventricular systolic function is normal. The right ventricular size is enlarged. Mildly increased right ventricular wall thickness. There is moderately elevated pulmonary artery systolic pressure. The estimated right ventricular systolic pressure is 58.3 mmHg.  3. The mitral valve is degenerative. No evidence of mitral valve regurgitation. No evidence of mitral stenosis.  4. Tricuspid valve regurgitation is moderate.  5. The aortic valve is normal in structure.  Aortic valve regurgitation is not visualized. No aortic stenosis is present.  6. The inferior vena cava is dilated in size with <50% respiratory variability, suggesting right atrial pressure of 15 mmHg. Comparison(s): A prior study was performed on  06/10/2023. Interventricular septum is flattened in systole and diastole, consistent with right ventricular pressure and volume overload and elevated RAP are new findings. FINDINGS  Left Ventricle: Left ventricular ejection fraction, by estimation, is 60 to 65%. The left ventricle has normal function. The left ventricle has no regional wall motion abnormalities. The left ventricular internal cavity size was normal in size. There is  mild left ventricular hypertrophy. The interventricular septum is flattened in systole and diastole, consistent with right ventricular pressure and volume overload. Left ventricular diastolic parameters were normal. Right Ventricle: The right ventricular size is enlarged. Mildly increased right ventricular wall thickness. Right ventricular systolic function is normal. There is moderately elevated pulmonary artery systolic pressure. The tricuspid regurgitant velocity  is 3.29 m/s, and with an assumed right atrial pressure of 15 mmHg, the estimated right ventricular systolic pressure is 58.3 mmHg. Left Atrium: Left atrial size was normal in size. Right Atrium: Right atrial size was normal in size. Pericardium: There is no evidence of pericardial effusion. Mitral Valve: The mitral valve is degenerative in appearance. There is moderate thickening of the mitral valve leaflet(s). Normal mobility of the mitral valve leaflets. No evidence of mitral valve regurgitation. No evidence of mitral valve stenosis. Tricuspid Valve: The tricuspid valve is normal in structure. Tricuspid valve regurgitation is moderate . No evidence of tricuspid stenosis. Aortic Valve: The aortic valve is normal in structure. Aortic valve regurgitation is not visualized. No aortic stenosis is present. Pulmonic Valve: The pulmonic valve was normal in structure. Pulmonic valve regurgitation is mild. No evidence of pulmonic stenosis. Aorta: The aortic root and ascending aorta are structurally normal, with no evidence of  dilitation. Venous: The inferior vena cava is dilated in size with less than 50% respiratory variability, suggesting right atrial pressure of 15 mmHg. IAS/Shunts: The atrial septum is grossly normal. Additional Comments: A device lead is visualized.  LEFT VENTRICLE PLAX 2D LVIDd:         4.40 cm   Diastology LVIDs:         3.20 cm   LV e' medial:    7.18 cm/s LV PW:         1.10 cm   LV E/e' medial:  5.1 LV IVS:        1.40 cm   LV e' lateral:   8.92 cm/s LVOT diam:     1.90 cm   LV E/e' lateral: 4.1 LV SV:         40 LV SV Index:   20 LVOT Area:     2.84 cm  RIGHT VENTRICLE             IVC RV Basal diam:  5.70 cm     IVC diam: 2.70 cm RV Mid diam:    5.40 cm RV S prime:     11.60 cm/s TAPSE (M-mode): 2.2 cm LEFT ATRIUM         Index       RIGHT ATRIUM           Index LA diam:    2.10 cm 1.03 cm/m  RA Area:     20.80 cm  RA Volume:   64.90 ml  31.92 ml/m  AORTIC VALVE LVOT Vmax:   93.20 cm/s LVOT Vmean:  59.900 cm/s LVOT VTI:    0.140 m  AORTA Ao Root diam: 2.80 cm Ao Asc diam:  3.30 cm MITRAL VALVE               TRICUSPID VALVE MV Area (PHT): 3.72 cm    TR Peak grad:   43.3 mmHg MV Decel Time: 204 msec    TR Vmax:        329.00 cm/s MV E velocity: 36.50 cm/s MV A velocity: 88.30 cm/s  SHUNTS MV E/A ratio:  0.41        Systemic VTI:  0.14 m                            Systemic Diam: 1.90 cm Sunit Tolia Electronically signed by Madonna Large Signature Date/Time: 12/07/2023/6:19:16 PM    Final    DG Chest Port 1 View Result Date: 12/07/2023 CLINICAL DATA:  Shortness of breath with generalized weakness. EXAM: PORTABLE CHEST 1 VIEW COMPARISON:  Radiographs 06/13/2023 and 01/08/2023. Chest CT 06/08/2018. FINDINGS: 0800 hours. Left subclavian pacemaker leads appear unchanged, projecting over the right atrium and right ventricle. The heart size and mediastinal contours are stable. Chronic calcified mediastinal and hilar lymph nodes and chronic calcified pleural plaque formation again  noted bilaterally. No definite superimposed airspace disease, edema, pneumothorax or significant pleural effusion. There is probable mild bibasilar atelectasis. No significant osseous findings are identified. IMPRESSION: No definite acute cardiopulmonary process. Chronic calcified pleural plaque formation and calcified mediastinal and hilar lymph nodes. Electronically Signed   By: Elsie Perone M.D.   On: 12/07/2023 08:37    Cardiac Studies  Echo 12/07/23: IMPRESSIONS     1. Left ventricular ejection fraction, by estimation, is 60 to 65%. The  left ventricle has normal function. The left ventricle has no regional  wall motion abnormalities. There is mild left ventricular hypertrophy.  Left ventricular diastolic parameters  were normal. There is the interventricular septum is flattened in systole  and diastole, consistent with right ventricular pressure and volume  overload.   2. Right ventricular systolic function is normal. The right ventricular  size is enlarged. Mildly increased right ventricular wall thickness. There  is moderately elevated pulmonary artery systolic pressure. The estimated  right ventricular systolic  pressure is 58.3 mmHg.   3. The mitral valve is degenerative. No evidence of mitral valve  regurgitation. No evidence of mitral stenosis.   4. Tricuspid valve regurgitation is moderate.   5. The aortic valve is normal in structure. Aortic valve regurgitation is  not visualized. No aortic stenosis is present.   6. The inferior vena cava is dilated in size with <50% respiratory  variability, suggesting right atrial pressure of 15 mmHg.   Comparison(s): A prior study was performed on 06/10/2023. Interventricular  septum is flattened in systole and diastole, consistent with right  ventricular pressure and volume overload and elevated RAP are new  findings.   Patient Profile   78 y.o. male with a hx of CAD, diabetes, hypertension, hyperlipidemia, OSA on BiPAP, aortic  atherosclerosis, and complete heart block status post DC PPM (05/2023) admitted with cellulitis and heart failure.   Assessment & Plan   # Acute on chronic HFpEF:  # RV failure: # Pulmomary hypertension:  Unlikely that this is related to chronic pacing. LV function remains normal.  On review  of prior echoes, he has chronic right-sided heart failure with recent exacerbation. Left heart function stable. History of respiratory failure and BiPAP use. Family history of congestive heart failure.  He had a RHC in 2019 that had normal PCWP and elevated PA pressure, suggesting underlying pulmonary hypertenison.  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  However, he saw pulmonary last in 2020 and his calcified pleural plaques were thought to be sequelae of prior granulomatous disease rather than due to asbestos exposure. There was no ILD on chest CT.  PFTs showed mixed obstructive and restrictive lung disease.  Smoking history is remote and not long-standing.  BP worsened with diuresis and he required IV fluid this AM.  Echo this admission showed worsened RV function, RA pressure 15 mmHg.  RV dsyfunction seems moderate on my review.  - Contiue Jardiance  - Will ask advanced HF to see.  I think he would benefit from RHC to better guide management. Help determine how much is edema is 2/2 R sided heart failure vs cellulitis.  If HF, he may need inotropes to diurese.    # OSA:  Chronic BiPAP use for respiratory support effective with clean filters. - Ensure regular maintenance and cleaning of BiPAP machine filters.   # Cellulitis:  Elevated white blood cell count suggests possible infection.  - Continue ABX per primary team.  Legs are looking better today.  - management per primary team    # CAD: Nonobstructive disease.  Continue atorvastatin  and Zetia .   # Hypertension: Blood pressure has been controlled on outpatient basis on amlodipine ,  chlorthalidone , and spironolactone . All currently on hold 2/2 hypotension.     For questions or updates, please contact [facility] Please consult [email or web address] for contact info under     Signed, Annabella Scarce, MD  12/08/2023, 8:42 AM

## 2023-12-08 NOTE — Progress Notes (Addendum)
 [facility] PROGRESS NOTE  Robert Ramirez (DOB: 02-22-1946) FMW:[id number] PCP: Lorren Greig PARAS, NP  Brief Narrative: Robert Ramirez is a 78 y.o. male with a history of HFpEF, RV dysfunction, CHB s/p PPM, T2DM, HTN, HLD, pleural plaques s/p asbestos exposure who presented to the ED on 12/07/2023 with leg swelling and weakness. Work up suggested acute on chronic HFpEF/RV filaure and occult infection for which antibiotics were given. Cardiology was consulted and advanced heart failure team is on board.   Subjective: Bottom is sore since they put him in trendelenberg this morning. No dyspnea at rest, feels actually quite fine. Somewhat hungry. No abd pain, chest pain, N/V/D. Denies urinary symptoms or fever.   Objective: BP (!) 100/55   Pulse 80   Temp 97.8 F (36.6 C) (Oral)   Resp 18   Ht 6' 1 (1.854 m)   Wt 79.4 kg   SpO2 97%   BMI 23.09 kg/m   Gen: No distress in trendelenberg  Pulm: Nonlabored, distant but clear.   CV: RRR, no murmurs, distant but no gallop. ++JVD and ++ dependent LE edema GI: Soft, NT, ND, +BS. No RUQ tenderness.  Neuro: Alert and oriented. No new focal deficits. Ext: Warm, no deformities. Skin: Significant BL LE edema with eschars and open wound with serous drainage on right lateral lower leg which is also quite tender and warm compared to the left. See pictures from this AM below.        Assessment & Plan: Principal Problem:   (HFpEF) heart failure with preserved ejection fraction (HCC) Active Problems:   Cellulitis   Elevated troponin   SIRS (systemic inflammatory response syndrome) (HCC)   Uncontrolled type 2 diabetes mellitus with hyperglycemia, with long-term current use of insulin  (HCC)   Acute kidney injury superimposed on chronic kidney disease (HCC)   Hypertension associated with diabetes (HCC)   History of permanent cardiac pacemaker placement   Acquired hypothyroidism   OSA treated with BiPAP   Acute on chronic right-sided heart  failure (HCC)  Acute on chronic HFpEF, RV failure, pulmonary HTN:  - Appreciate HF team consult, planning RHC 7/16.  - Continue diuresis as tolerated - Monitor I/O, daily weights.   Sepsis due to cellulitis of RLE: Only nidus for infection on exam is tender erythema to lower right leg associated with swelling.  - Continue broad antibiotics for now and monitor cultures. If cultures negative, consider deescalation.   Hypotension, essential hypertension: He is afebrile without tachycardia, though BP has been quite low. Checked lactic acid this AM which was normal and on exam he's perfusing adequately. ?spurious result.  - Given some fluid back and improving. BP higher after cuff adjustment.  - Hold norvasc , chlorthalidone  and spironolactone .  - Midodrine  started to support diuresis.   Hypoglycemia:  - Hold insulin  for now - Check AM cortisol   Hyperbilirubinemia: No abd tenderness, ?congestive hepatopathy.  - U/S ordered.  - Trend  CHB s/p PPM:  - Interrogation per cardiology revealed very short episodes of AFib/flutter, though doesn't indicate full dose anticoagulation at this time.   Hypothyroidism: TSH wnl.  - Continue synthroid .   AKI on stage IIIa CKD:  - Monitor CrCl with diuresis - Hold SGLT2i   CAD, HLD: Troponin elevation but no chest pain.  - Continue statin/zetia  - Defer work up to cardiology.   OSA:  - Continue BiPAP qHS  Bernardino KATHEE Come, MD [facility] [email or web address] 12/08/2023, 5:07 PM

## 2023-12-08 NOTE — Consult Note (Addendum)
 Advanced Heart Failure Team Consult Note   Primary Physician: Lorren Greig PARAS, NP Cardiologist:  Lurena MARLA Red, MD  Reason for Consultation: Acute on chronic HFpEF with RV failure, pulmonary hypertension  HPI:    Robert Ramirez is seen today for evaluation of acute on chronic HFpEF with RV failure and pulmonary hypertension at the request of Dr. Raford with [facility]. 78 y.o. male with history of CAD, chronic HFpEF with RV failure, DM, HTN, HLD, OSA and chronic respiratory failure on BiPAP, CHB s/p dual chamber PPM with LBBAP lead in 01/25, hx asbestos exposure with pleural plaque on prior HRCT but no ILD, remote tobacco use.   There appeared to be evidence of RV dysfunction on echo dating back to 2019. L/RHC in 02/2018: Nonobstructive CAD involving RCA, PCWP mean 8, PA 46/16 (26), Fick CO/CI 4.45/2.07, TD CO/CI 3.64/1.69.   Presented 12/07/23 with worsening weakness, lower extremity edema and shortness of breath x 1.5 weeks. Reports he has been short of breath with exertion for some time. HE was admitted with acute on chronic HFpEF with RV failure and possible cellulitis involving lower extremities. Echo with LVEF 60-65%, interventricular septum is flattened, RV enlarged and HK, RVSP 58 mmHg, moderate TR. He became hypotensive with attempts to diurese and was given total of 1700 cc IVF this am.   He lives at home with his wife. Reports he had been independent in ADLs but has had progressive weakness in recent months, more noteable the last couple of weeks.   Home Medications Prior to Admission medications   Medication Sig Start Date End Date Taking? Authorizing Provider  amLODipine  (NORVASC ) 10 MG tablet Take 1 tablet (10 mg total) by mouth daily. 08/04/22  Yes Lorren Greig PARAS, NP  aspirin  EC 81 MG tablet Take 81 mg by mouth daily. Swallow whole.   Yes [provider]  atorvastatin  (LIPITOR) 40 MG tablet Take 40 mg by mouth daily.   Yes [provider]   chlorthalidone  (HYGROTON ) 25 MG tablet TAKE 1 TABLET EVERY DAY 10/16/22  Yes Thukkani, Arun K, MD  Cholecalciferol (VITAMIN D-3) 25 MCG (1000 UT) CAPS Take 1 capsule by mouth every morning.   Yes [provider]  empagliflozin  (JARDIANCE ) 10 MG TABS tablet Take 1 tablet (10 mg total) by mouth daily before breakfast. 05/07/23  Yes Thukkani, Arun K, MD  ezetimibe  (ZETIA ) 10 MG tablet Take 1 tablet (10 mg total) by mouth daily. 11/05/23  Yes Thukkani, Arun K, MD  Insulin  NPH, Human,, Isophane, (HUMULIN  N KWIKPEN) 100 UNIT/ML Kiwkpen Inject 33 Units into the skin in the morning and at bedtime. 10/02/23  Yes Newlin, Enobong, MD  levothyroxine  (SYNTHROID ) 50 MCG tablet Take 1 tablet (50 mcg total) by mouth daily. 09/30/23 12/29/23 Yes Lorren, Amy J, NP  lisinopril  (ZESTRIL ) 40 MG tablet TAKE 1 TABLET EVERY DAY 10/16/22  Yes Thukkani, Arun K, MD  metFORMIN  (GLUCOPHAGE ) 1000 MG tablet Take 1 tablet (1,000 mg total) by mouth 2 (two) times daily with a meal. 09/30/23 12/29/23 Yes Lorren, Amy J, NP  nitroGLYCERIN  (NITROSTAT ) 0.4 MG SL tablet Place 1 tablet (0.4 mg total) under the tongue every 5 (five) minutes as needed for chest pain. 03/29/21  Yes Thukkani, Arun K, MD  spironolactone  (ALDACTONE ) 25 MG tablet TAKE 1 TABLET EVERY DAY Patient taking differently: Take 12.5 mg by mouth daily. 03/12/23  Yes Thukkani, Arun K, MD  vitamin B-12 (CYANOCOBALAMIN ) 500 MCG tablet Take 500 mcg by mouth daily.   Yes [provider]  blood glucose meter kit and supplies Dispense based on patient and insurance preference. Use up to four times daily as directed. (FOR ICD-10 E10.9, E11.9). 02/08/22   Lorren Greig PARAS, NP  Insulin  Pen Needle (DROPLET PEN NEEDLES) 31G X 8 MM MISC 1 each by Other route 2 (two) times daily with a meal. 08/04/22   Lorren Greig PARAS, NP    Past Medical History: Past Medical History:  Diagnosis Date   Diabetes mellitus    High cholesterol    Hypertension     Past Surgical History: Past  Surgical History:  Procedure Laterality Date   APPENDECTOMY     PACEMAKER IMPLANT N/A 06/12/2023   Procedure: PACEMAKER IMPLANT;  Surgeon: Kennyth Chew, MD;  Location: [facility] INVASIVE CV LAB;  Service: Cardiovascular;  Laterality: N/A;   RIGHT/LEFT HEART CATH AND CORONARY ANGIOGRAPHY N/A 03/17/2018   Procedure: RIGHT/LEFT HEART CATH AND CORONARY ANGIOGRAPHY;  Surgeon: Claudene Pacific, MD;  Location: [facility] INVASIVE CV LAB;  Service: Cardiovascular;  Laterality: N/A;    Family History: Family History  Problem Relation Age of Onset   Heart failure Mother    Cancer Father    Diabetes Sister    Cancer Sister     Social History: Social History   Socioeconomic History   Marital status: Married    Spouse name: Not on file   Number of children: Not on file   Years of education: Not on file   Highest education level: Not on file  Occupational History   Not on file  Tobacco Use   Smoking status: Former    Current packs/day: 0.00    Types: Cigarettes    Quit date: 03/30/1980    Years since quitting: 43.7    Passive exposure: Never   Smokeless tobacco: Current    Types: Chew, Snuff  Vaping Use   Vaping status: Never Used  Substance and Sexual Activity   Alcohol use: No   Drug use: No   Sexual activity: Not Currently  Other Topics Concern   Not on file  Social History Narrative   Not on file   Social Drivers of Health   Financial Resource Strain: Low Risk  (08/06/2023)   Overall Financial Resource Strain (CARDIA)    Difficulty of Paying Living Expenses: Not hard at all  Food Insecurity: No Food Insecurity (08/06/2023)   Hunger Vital Sign    Worried About Running Out of Food in the Last Year: Never true    Ran Out of Food in the Last Year: Never true  Transportation Needs: No Transportation Needs (08/06/2023)   PRAPARE - Administrator, Civil Service (Medical): No    Lack of Transportation (Non-Medical): No  Physical Activity: Sufficiently Active (08/06/2023)   Exercise  Vital Sign    Days of Exercise per Week: 7 days    Minutes of Exercise per Session: 30 min  Stress: No Stress Concern Present (08/06/2023)   [employer] of Occupational Health - Occupational Stress Questionnaire    Feeling of Stress : Not at all  Social Connections: Moderately Isolated (08/06/2023)   Social Connection and Isolation Panel    Frequency of Communication with Friends and Family: More than three times a week    Frequency of Social Gatherings with Friends and Family: More than three times a week    Attends Religious Services: Never    Database administrator or Organizations: No    Attends Banker Meetings: Never    Marital  Status: Married    Allergies:  No Known Allergies  Objective:    Vital Signs:   Temp:  [97.9 F (36.6 C)-98.5 F (36.9 C)] 97.9 F (36.6 C) (07/15 1015) Pulse Rate:  [72-107] 82 (07/15 1015) Resp:  [14-33] 18 (07/15 1015) BP: (74-118)/(47-80) 92/71 (07/15 1015) SpO2:  [91 %-100 %] 97 % (07/15 1015) Last BM Date : 12/07/23  Weight change: Filed Weights   12/07/23 0746  Weight: 79.4 kg    Intake/Output:   Intake/Output Summary (Last 24 hours) at 12/08/2023 1119 Last data filed at 12/08/2023 0810 Gross per 24 hour  Intake 1250.31 ml  Output 950 ml  Net 300.31 ml      Physical Exam    General:  Chronically ill appearing elderly male Neck: JVP to jaw Cor: Irregular rhythm. No rubs, gallops or murmurs. Lungs: diminished Abdomen: soft, nontender, nondistended.  Extremities: 2+ edema, left leg is cool, both anterior shins are erythematous with multiple wounds Neuro: alert & orientedx3. Affect pleasant   Telemetry   ? SR with PACs vs atrial fibrillation, intermittent V pacing  Labs   Basic Metabolic Panel: Recent Labs  Lab 12/07/23 0758 12/07/23 0801 12/08/23 0401  NA 135 134* 138  K 4.8 4.6 3.9  CL 93* 91* 96*  CO2  --  32 33*  GLUCOSE 279* 279* 113*  BUN 37* 32* 39*  CREATININE 1.60* 1.80* 1.67*   CALCIUM   --  9.9 9.8    Liver Function Tests: Recent Labs  Lab 12/07/23 0801  AST 28  ALT 16  ALKPHOS 106  BILITOT 2.5*  PROT 6.1*  ALBUMIN 3.0*   No results for input(s): LIPASE, AMYLASE in the last 168 hours. No results for input(s): AMMONIA in the last 168 hours.  CBC: Recent Labs  Lab 12/07/23 0758 12/07/23 0801 12/08/23 0401  WBC  --  18.2* 16.0*  NEUTROABS  --  16.1*  --   HGB 17.0 16.4 14.3  HCT 50.0 51.1 46.4  MCV  --  93.1 94.7  PLT  --  165 163    Cardiac Enzymes: No results for input(s): CKTOTAL, CKMB, CKMBINDEX, TROPONINI in the last 168 hours.  BNP: BNP (last 3 results) Recent Labs    12/07/23 0816  BNP 1,195.7*    ProBNP (last 3 results) No results for input(s): PROBNP in the last 8760 hours.   CBG: Recent Labs  Lab 12/07/23 1657 12/07/23 2154 12/07/23 2355 12/08/23 0831 12/08/23 1003  GLUCAP 182* 163* 166* 60* 87    Coagulation Studies: No results for input(s): LABPROT, INR in the last 72 hours.   Imaging   ECHOCARDIOGRAM COMPLETE Result Date: 12/07/2023    ECHOCARDIOGRAM REPORT   Patient Name:   HADRIAN YARBROUGH Date of Exam: 12/07/2023 Medical Rec #:  [id number]       Height:       73.0 in Accession #:    [id number]      Weight:       175.0 lb Date of Birth:  1945-07-22        BSA:          2.033 m Patient Age:    77 years        BP:           116/74 mmHg Patient Gender: M               HR:           88 bpm. Exam Location:  Inpatient Procedure: 2D Echo,  Cardiac Doppler and Color Doppler (Both Spectral and Color            Flow Doppler were utilized during procedure). Indications:    Congestive Heart Failure I50.9  History:        Patient has prior history of Echocardiogram examinations, most                 recent 06/10/2023. HFpEF, CAD, COPD; Risk Factors:Hypertension,                 Diabetes, Former Smoker and Dyslipidemia.  Sonographer:    Koleen Popper RDCS Referring Phys: [id number] RONDELL A SMITH  Sonographer  Comments: Suboptimal apical window. Image acquisition challenging due to respiratory motion. IMPRESSIONS  1. Left ventricular ejection fraction, by estimation, is 60 to 65%. The left ventricle has normal function. The left ventricle has no regional wall motion abnormalities. There is mild left ventricular hypertrophy. Left ventricular diastolic parameters were normal. There is the interventricular septum is flattened in systole and diastole, consistent with right ventricular pressure and volume overload.  2. Right ventricular systolic function is normal. The right ventricular size is enlarged. Mildly increased right ventricular wall thickness. There is moderately elevated pulmonary artery systolic pressure. The estimated right ventricular systolic pressure is 58.3 mmHg.  3. The mitral valve is degenerative. No evidence of mitral valve regurgitation. No evidence of mitral stenosis.  4. Tricuspid valve regurgitation is moderate.  5. The aortic valve is normal in structure. Aortic valve regurgitation is not visualized. No aortic stenosis is present.  6. The inferior vena cava is dilated in size with <50% respiratory variability, suggesting right atrial pressure of 15 mmHg. Comparison(s): A prior study was performed on 06/10/2023. Interventricular septum is flattened in systole and diastole, consistent with right ventricular pressure and volume overload and elevated RAP are new findings. FINDINGS  Left Ventricle: Left ventricular ejection fraction, by estimation, is 60 to 65%. The left ventricle has normal function. The left ventricle has no regional wall motion abnormalities. The left ventricular internal cavity size was normal in size. There is  mild left ventricular hypertrophy. The interventricular septum is flattened in systole and diastole, consistent with right ventricular pressure and volume overload. Left ventricular diastolic parameters were normal. Right Ventricle: The right ventricular size is enlarged.  Mildly increased right ventricular wall thickness. Right ventricular systolic function is normal. There is moderately elevated pulmonary artery systolic pressure. The tricuspid regurgitant velocity  is 3.29 m/s, and with an assumed right atrial pressure of 15 mmHg, the estimated right ventricular systolic pressure is 58.3 mmHg. Left Atrium: Left atrial size was normal in size. Right Atrium: Right atrial size was normal in size. Pericardium: There is no evidence of pericardial effusion. Mitral Valve: The mitral valve is degenerative in appearance. There is moderate thickening of the mitral valve leaflet(s). Normal mobility of the mitral valve leaflets. No evidence of mitral valve regurgitation. No evidence of mitral valve stenosis. Tricuspid Valve: The tricuspid valve is normal in structure. Tricuspid valve regurgitation is moderate . No evidence of tricuspid stenosis. Aortic Valve: The aortic valve is normal in structure. Aortic valve regurgitation is not visualized. No aortic stenosis is present. Pulmonic Valve: The pulmonic valve was normal in structure. Pulmonic valve regurgitation is mild. No evidence of pulmonic stenosis. Aorta: The aortic root and ascending aorta are structurally normal, with no evidence of dilitation. Venous: The inferior vena cava is dilated in size with less than 50% respiratory variability, suggesting right atrial pressure of 15 mmHg. IAS/Shunts:  The atrial septum is grossly normal. Additional Comments: A device lead is visualized.  LEFT VENTRICLE PLAX 2D LVIDd:         4.40 cm   Diastology LVIDs:         3.20 cm   LV e' medial:    7.18 cm/s LV PW:         1.10 cm   LV E/e' medial:  5.1 LV IVS:        1.40 cm   LV e' lateral:   8.92 cm/s LVOT diam:     1.90 cm   LV E/e' lateral: 4.1 LV SV:         40 LV SV Index:   20 LVOT Area:     2.84 cm  RIGHT VENTRICLE             IVC RV Basal diam:  5.70 cm     IVC diam: 2.70 cm RV Mid diam:    5.40 cm RV S prime:     11.60 cm/s TAPSE (M-mode): 2.2  cm LEFT ATRIUM         Index       RIGHT ATRIUM           Index LA diam:    2.10 cm 1.03 cm/m  RA Area:     20.80 cm                                 RA Volume:   64.90 ml  31.92 ml/m  AORTIC VALVE LVOT Vmax:   93.20 cm/s LVOT Vmean:  59.900 cm/s LVOT VTI:    0.140 m  AORTA Ao Root diam: 2.80 cm Ao Asc diam:  3.30 cm MITRAL VALVE               TRICUSPID VALVE MV Area (PHT): 3.72 cm    TR Peak grad:   43.3 mmHg MV Decel Time: 204 msec    TR Vmax:        329.00 cm/s MV E velocity: 36.50 cm/s MV A velocity: 88.30 cm/s  SHUNTS MV E/A ratio:  0.41        Systemic VTI:  0.14 m                            Systemic Diam: 1.90 cm Sunit Tolia Electronically signed by Madonna Large Signature Date/Time: 12/07/2023/6:19:16 PM    Final      Medications:     Current Medications:  aspirin  EC  81 mg Oral Daily   atorvastatin   40 mg Oral Daily   enoxaparin  (LOVENOX ) injection  40 mg Subcutaneous Q24H   ezetimibe   10 mg Oral Daily   furosemide   40 mg Intravenous BID   insulin  aspart  0-9 Units Subcutaneous TID WC   insulin  NPH Human  30 Units Subcutaneous BID AC & HS   levothyroxine   50 mcg Oral Q0600   sodium chloride  flush  3 mL Intravenous Q12H    Infusions:  ceFEPime  (MAXIPIME ) IV Stopped (12/08/23 9367)   vancomycin         Patient Profile   78 y.o. male with history of CAD, HFpEF with RV failure, DM, HTN, HLD, OSA and chronic respiratory failure on BiPAP, CHB s/p dual chamber PPM with LBBAP lead in 01/25, hx asbestos exposure with pleural plaque on prior HRCT but no ILD, remote tobacco use.  Admitted with acute on chronic HFpEF with RV failure and possible b/l lower extremity cellulitis.   Assessment/Plan   Acute on chronic HFpEF with RV failure Pulmonary hypertension -Evidence of RV dysfunction on echo dating back to 2019.  -L/RHC in 02/2018: Nonobstructive CAD involving RCA, PCWP mean 8, PA 46/16 (26), Fick CO/CI 4.45/2.07, TD CO/CI 3.64/1.69.  -Echo with LVEF 60-65%, interventricular septum  is flattened, RV enlarged and HK, RVSP 58 mmHg, moderate TR -Suspect WHO group III pulmonary hypertension in setting of longstanding respiratory failure/OSA. Reports adherence with BiPAP. Previously on supplemental O2 -PFTs in 2020 with mixed restrictive and obstructive defect -Had prior exposure to asbestos but no evidence of ILD on prior CT.  -RHC tomorrow -He is volume overloaded. Trouble tolerating diuresis d/t hypotension. Start midodrine  5 mg TID and add IV lasix  80 mg BID. Suspect he may be low-output. Will see how he does with diuresis today.  2. AKI on CKD IIIa -Scr baseline 1.3-1.4, up to 1.8 -Suspect cardiorenal syndrome.  -Diuresis as above  3. Possible cellulitis -Abx per primary team -Afebrile but does have leukocytosis  4. CHB s/p dual chamber PPM w/ LBBAP lead Possible atrial fibrillation -? Sinus rhythm with PACs vs atrial fibrillation -Device interrogated by [facility]. [employer] rep. Several very brief runs of atrial flutter noted, just seconds in duration. Will need anticoagulation if burden increases.  5. Hyperbilirubinemia - T bili 2.5 - May have cardiac cirrhosis from long-standing RV failure - Check RUQ US    Length of Stay: 1  FINCH, LINDSAY N, PA-C  12/08/2023, 11:19 AM    Advanced Heart Failure Team Pager [phone] (M-F; 7a - 5p)  Please contact [facility] for night-coverage after hours (4p -7a ) and weekends on [email or web address]   Patient seen and examined with the above-signed Advanced Practice Provider and/or Housestaff. I personally reviewed laboratory data, imaging studies and relevant notes. I independently examined the patient and formulated the important aspects of the plan. I have edited the note to reflect any of my changes or salient points. I have personally discussed the plan with the patient and/or family.  78 y/o OSA on BIPAP, CHB s/p PPM and h/o PH.   Now admitted with significant edema and LE cellulitis.    Echo with LVEF 60-65%, interventricular  septum is flattened, RV enlarged and HK, RVSP 58 mmHg, moderate TR. (Personally reviewed)  He became hypotensive with attempts to diurese and was given total of 1700 cc IVF this am.   Has been complaint with BIPAP. Previous work-up for ILD was unimpressive.   General:  Chronically ill appearing male sitting in chair. No resp difficulty HEENT: normal Neck: supple. JVP to ear Cor: Regular rate & rhythm. No rubs, gallops or murmurs. Lungs: clear Abdomen: soft, nontender, nondistended. No hepatosplenomegaly. No bruits or masses. Good bowel sounds. Extremities: no cyanosis, clubbing, rash,2-3+ edema with diffuse cellulitis change R>L  cool  Neuro: alert & orientedx3, cranial nerves grossly intact. moves all 4 extremities w/o difficulty. Affect pleasant  He appears to have progressive PAH/cor pulmonale. Will start midodrine  for BP support and re-challenge with IV lasix . Plan RHC tomorrow to further evaluate. Continue abx fo cellulitis.   Toribio Fuel, MD  5:03 PM

## 2023-12-08 NOTE — ED Notes (Signed)
 Pt eating breakfast and given 4oz apple juice.

## 2023-12-08 NOTE — ED Notes (Addendum)
 500CC Bolus complete. Pt BP 80/46 MAP 57. Howerter MD notified.   MD states 250 cc ivf bolus. as long as he is still asymptomatic , please ask the incoming day shift RN to please notify Dr. Bryn of the updated BP after this 250 cc bolus.

## 2023-12-08 NOTE — Progress Notes (Addendum)
 [facility] night cross cover note:   Regarding this patient who is hospitalized with generalized weakness in the setting of concern for cellulitis, for which he is on IV antibiotics, and potential acute on chronic diastolic heart failure exacerbation, I was notified by the patient's RN, the patient's blood pressure running slightly soft, with SBP into the mid 70's, compared to systolic BP's in the 90's towards end of day shift.  The patient was resting at the time, and RN conveys that he remains asymptomatic.  Corresponding vital signs notable for the following afebrile; heart rates in the 80s, respiratory rate 17, and oxygen saturation 97 to 99% on room air.   He has a reported history of chronic diastolic heart failure, with most recent echocardiogram performed yesterday (12/07/23), whi was notable for LVEF 60 to 65%,ch no focal wall motion abnormalities, normal diastolic parameters, normal right ventricular systolic function, and moderate tricuspid regurgitation.  In the setting of potential right-sided heart failure, he received dose of IV Lasix  and has subsequently put on 1 L of urine on this night shift, which was on top of documentation of multiple unmeasured urine outputs during day shift.   Given his risk for right-sided heart failure in the setting of moderate tricuspid regurgitation, he is at increased risk for preload dependent pathophysiology.  In the context of decrease in blood pressure following the above urine output, and no current symptoms, including no sob, while maintaining O2 sats in the high 90's on RA, will provide a small 200 cc IVF bolus and monitor for associated response in ensuing BP trend.   Update: there was an initial increase in the pt's blood pressure with the 250 cc LR bolus, with SBP's initially increasing into the 80's before trending back down. Following this small IVF bolus, patient remains asymptomatic, including no report of sob. HR's remain in the 80's with RR 19, and O2  sat is in the range of 98-99% on RA. Consequently, I have ordered a 500 cc LR bolus to be administered over 2 hours and asked, in the setting of suspected right-sided heart failure, that patient's bilateral lower extremities be elevated for gravity-assisted redistribution of fluid.    Update: BP starting to improve following this 500 cc LR bolus, with SBP now into the 80's mmHg. Patient remains asymptomatic at this time.  Given this trend, and absence of acute respiratory symptoms or evidence of acute left-sided heart failure , will order one additional 250 cc LR bolus at this time.     Eva Pore, DO Hospitalist

## 2023-12-08 NOTE — Inpatient Diabetes Management (Signed)
 Inpatient Diabetes Program Recommendations  AACE/ADA: New Consensus Statement on Inpatient Glycemic Control (2015)  Target Ranges:  Prepandial:   less than 140 mg/dL      Peak postprandial:   less than 180 mg/dL (1-2 hours)      Critically ill patients:  140 - 180 mg/dL   Lab Results  Component Value Date   GLUCAP 87 12/08/2023   HGBA1C 8.0 (A) 09/30/2023    Review of Glycemic Control  Diabetes history: DM2 Outpatient Diabetes medications: Humulin  N 33 units ac breakfast and hs, Jardiance  10 mg daily, Metformin  1 gm bid. Current orders for Inpatient glycemic control: NPH 30 units ac breakfast and hs Novolog  0-9 units tid  Inpatient Diabetes Program Recommendations:   Please consider: -D/C NPH insulin  -Add Semglee  25 units daily  Thank you, Willadene Mounsey E. Corlette Ciano, RN, MSN, CDCES  Diabetes Coordinator Inpatient Glycemic Control Team Team Pager [phone] (8am-5pm) 12/08/2023 11:20 AM

## 2023-12-08 NOTE — Evaluation (Signed)
 Physical Therapy Evaluation Patient Details Name: Robert Ramirez MRN: [id number] DOB: Aug 20, 1945 Today's Date: 12/08/2023  History of Present Illness  Pt is a 78 y.o. M presenting to [facility] on 12/07/23 w/ leg weakness and swelling. PMH is significant for HTN, HLD, HFpEF, heart block s/p PPM, and DMT2.  Clinical Impression  Prior to admittance, pt was ambulating w/ a SPC and was independent w/ ADLs. Pt presents to evaluation with deficits in mobility, strength, power, balance, activity tolerance, and pain, all limiting pt's ability to mobilize near baseline. Pt requires moderate physical assistance w/ AD for sit to stands from various surfaces, and was able to ambulate room level distances w/ AD and no physical assistance given. Pt requires cueing for sequencing and occasional walker management. Pt would benefit from further gait training. PT will continue to treat pt while he is admitted. Recommending HHPT at discharge to address remaining mobility deficits and optimize return to PLOF.         If plan is discharge home, recommend the following: A little help with walking and/or transfers;A little help with bathing/dressing/bathroom;Assistance with cooking/housework;Assist for transportation   Can travel by private vehicle        Equipment Recommendations Rolling walker (2 wheels)  Recommendations for Other Services       Functional Status Assessment Patient has had a recent decline in their functional status and demonstrates the ability to make significant improvements in function in a reasonable and predictable amount of time.     Precautions / Restrictions Precautions Precautions: Fall Recall of Precautions/Restrictions: Intact Restrictions Weight Bearing Restrictions Per Provider Order: No      Mobility  Bed Mobility Overal bed mobility: Needs Assistance Bed Mobility: Supine to Sit     Supine to sit: Supervision, HOB elevated, Used rails     General bed mobility comments:  increased time to complete    Transfers Overall transfer level: Needs assistance Equipment used: Rolling walker (2 wheels) Transfers: Sit to/from Stand Sit to Stand: Mod assist           General transfer comment: STS from EOB, recliner, and bathroom w/ RW and physical assistance for intiail power-up. VC given for increased anterior trunk lean, UE and LE placement; increased time to complete. Pt completed stand-step transfer from EOB to the recliner on the R w/ RW. VC given for sequencing.    Ambulation/Gait Ambulation/Gait assistance: Contact guard assist Gait Distance (Feet): 30 Feet Assistive device: Rolling walker (2 wheels) Gait Pattern/deviations: Step-to pattern, Decreased step length - right, Decreased stance time - right, Decreased weight shift to right, Antalgic, Trunk flexed Gait velocity: reduced Gait velocity interpretation: <1.8 ft/sec, indicate of risk for recurrent falls   General Gait Details: Pt ambulates w/ step-to gait pattern and increased reliance on RW for stability. Pt demonstrates reduced WB on RLE and reduced step length.  Stairs            Wheelchair Mobility     Tilt Bed    Modified Rankin (Stroke Patients Only)       Balance Overall balance assessment: Needs assistance Sitting-balance support: Feet supported, No upper extremity supported Sitting balance-Leahy Scale: Fair Sitting balance - Comments: seated EOB   Standing balance support: Bilateral upper extremity supported, During functional activity, Reliant on assistive device for balance Standing balance-Leahy Scale: Poor Standing balance comment: reliant on external support  Pertinent Vitals/Pain Pain Assessment Pain Assessment: No/denies pain    Home Living Family/patient expects to be discharged to:: Private residence Living Arrangements: Spouse/significant other Available Help at Discharge: Family;Available 24 hours/day (wife and  stepson) Type of Home: House Home Access: Level entry       Home Layout: One level Home Equipment: Cane - single point;BSC/3in1      Prior Function Prior Level of Function : Independent/Modified Independent             Mobility Comments: independent w/ SPC ADLs Comments: independent     Extremity/Trunk Assessment   Upper Extremity Assessment Upper Extremity Assessment: Generalized weakness    Lower Extremity Assessment Lower Extremity Assessment: Generalized weakness    Cervical / Trunk Assessment Cervical / Trunk Assessment: Kyphotic  Communication   Communication Communication: No apparent difficulties    Cognition Arousal: Alert Behavior During Therapy: WFL for tasks assessed/performed   PT - Cognitive impairments: No apparent impairments                         Following commands: Intact       Cueing Cueing Techniques: Verbal cues, Visual cues     General Comments General comments (skin integrity, edema, etc.): BP supine: 91/58, BP seated: 108/79, BP standing: 76/65, BP 3 mins standing: 97/47. Upon entering room on 3L w/ oxygen destating to mid-low 80s; on 4L at end of session w/ oxygen reading 93.    Exercises     Assessment/Plan    PT Assessment Patient needs continued PT services  PT Problem List Decreased strength;Decreased activity tolerance;Decreased balance;Decreased mobility;Decreased coordination;Decreased knowledge of use of DME;Cardiopulmonary status limiting activity;Pain       PT Treatment Interventions DME instruction;Gait training;Functional mobility training;Therapeutic activities;Therapeutic exercise;Balance training;Patient/family education;Manual techniques;Modalities    PT Goals (Current goals can be found in the Care Plan section)  Acute Rehab PT Goals Patient Stated Goal: to feel better PT Goal Formulation: With patient Time For Goal Achievement: 12/22/23 Potential to Achieve Goals: Fair    Frequency Min  2X/week     Co-evaluation               AM-PAC PT 6 Clicks Mobility  Outcome Measure Help needed turning from your back to your side while in a flat bed without using bedrails?: A Little Help needed moving from lying on your back to sitting on the side of a flat bed without using bedrails?: A Little Help needed moving to and from a bed to a chair (including a wheelchair)?: A Little Help needed standing up from a chair using your arms (e.g., wheelchair or bedside chair)?: A Lot Help needed to walk in hospital room?: A Little Help needed climbing 3-5 steps with a railing? : A Lot 6 Click Score: 16    End of Session Equipment Utilized During Treatment: Gait belt;Oxygen Activity Tolerance: Patient tolerated treatment well Patient left: in chair;with call bell/phone within reach;with chair alarm set Nurse Communication: Mobility status PT Visit Diagnosis: Unsteadiness on feet (R26.81);Other abnormalities of gait and mobility (R26.89);Muscle weakness (generalized) (M62.81);Pain Pain - Right/Left: Right Pain - part of body: Leg    Time: 1121-1217 PT Time Calculation (min) (ACUTE ONLY): 56 min   Charges:   PT Evaluation $PT Eval Low Complexity: 1 Low PT Treatments $Therapeutic Activity: 8-22 mins PT General Charges $$ ACUTE PT VISIT: 1 Visit         Leontine Hilt, SPT Acute Rehab [phone]   Leontine Hilt 12/08/2023,  1:06 PM

## 2023-12-08 NOTE — Progress Notes (Signed)
 Went to speak with pt about wearing a bipap mask but pt. Was to lethargic to speak about it. Pt. Does not appear to be alert enough to wear a mask. RN made aware and is currently at pt. Bedside.

## 2023-12-08 NOTE — ED Notes (Signed)
 Pts BLE elevated per order.

## 2023-12-08 NOTE — ED Notes (Signed)
 To floor with EMT.

## 2023-12-09 ENCOUNTER — Encounter (HOSPITAL_COMMUNITY)
Admission: EM | Disposition: A | Discharge status: Home Health | Payer: Self-pay | Source: Home / Self Care | Attending: Pulmonary Disease

## 2023-12-09 ENCOUNTER — Inpatient Hospital Stay (HOSPITAL_COMMUNITY)

## 2023-12-09 DIAGNOSIS — G9341 Metabolic encephalopathy: Secondary | ICD-10-CM

## 2023-12-09 DIAGNOSIS — I50810 Right heart failure, unspecified: Secondary | ICD-10-CM | POA: Diagnosis not present

## 2023-12-09 DIAGNOSIS — I272 Pulmonary hypertension, unspecified: Secondary | ICD-10-CM

## 2023-12-09 DIAGNOSIS — I10 Essential (primary) hypertension: Secondary | ICD-10-CM

## 2023-12-09 DIAGNOSIS — J9622 Acute and chronic respiratory failure with hypercapnia: Secondary | ICD-10-CM

## 2023-12-09 DIAGNOSIS — J9621 Acute and chronic respiratory failure with hypoxia: Secondary | ICD-10-CM | POA: Diagnosis not present

## 2023-12-09 DIAGNOSIS — R579 Shock, unspecified: Secondary | ICD-10-CM

## 2023-12-09 DIAGNOSIS — J9692 Respiratory failure, unspecified with hypercapnia: Secondary | ICD-10-CM | POA: Diagnosis not present

## 2023-12-09 DIAGNOSIS — I2721 Secondary pulmonary arterial hypertension: Secondary | ICD-10-CM

## 2023-12-09 DIAGNOSIS — A419 Sepsis, unspecified organism: Secondary | ICD-10-CM

## 2023-12-09 DIAGNOSIS — I5033 Acute on chronic diastolic (congestive) heart failure: Secondary | ICD-10-CM | POA: Diagnosis not present

## 2023-12-09 DIAGNOSIS — E119 Type 2 diabetes mellitus without complications: Secondary | ICD-10-CM

## 2023-12-09 DIAGNOSIS — G4733 Obstructive sleep apnea (adult) (pediatric): Secondary | ICD-10-CM | POA: Diagnosis not present

## 2023-12-09 HISTORY — PX: CENTRAL LINE INSERTION: CATH118232

## 2023-12-09 HISTORY — PX: RIGHT HEART CATH: CATH118263

## 2023-12-09 HISTORY — PX: ARTERIAL LINE INSERTION: CATH118227

## 2023-12-09 LAB — CBC WITH DIFFERENTIAL/PLATELET
Abs Immature Granulocytes: 0.14 K/uL — ABNORMAL HIGH (ref 0.00–0.07)
Basophils Absolute: 0 K/uL (ref 0.0–0.1)
Basophils Relative: 0 %
Eosinophils Absolute: 0 K/uL (ref 0.0–0.5)
Eosinophils Relative: 0 %
HCT: 46 % (ref 39.0–52.0)
Hemoglobin: 14.1 g/dL (ref 13.0–17.0)
Immature Granulocytes: 1 %
Lymphocytes Relative: 4 %
Lymphs Abs: 0.6 K/uL — ABNORMAL LOW (ref 0.7–4.0)
MCH: 29 pg (ref 26.0–34.0)
MCHC: 30.7 g/dL (ref 30.0–36.0)
MCV: 94.5 fL (ref 80.0–100.0)
Monocytes Absolute: 1.3 K/uL — ABNORMAL HIGH (ref 0.1–1.0)
Monocytes Relative: 9 %
Neutro Abs: 12.8 K/uL — ABNORMAL HIGH (ref 1.7–7.7)
Neutrophils Relative %: 86 %
Platelets: 192 K/uL (ref 150–400)
RBC: 4.87 MIL/uL (ref 4.22–5.81)
RDW: 14.4 % (ref 11.5–15.5)
WBC: 14.8 K/uL — ABNORMAL HIGH (ref 4.0–10.5)
nRBC: 0 % (ref 0.0–0.2)

## 2023-12-09 LAB — POCT I-STAT 7, (LYTES, BLD GAS, ICA,H+H)
Acid-Base Excess: 0 mmol/L (ref 0.0–2.0)
Bicarbonate: 33.9 mmol/L — ABNORMAL HIGH (ref 20.0–28.0)
Calcium, Ion: 1.41 mmol/L — ABNORMAL HIGH (ref 1.15–1.40)
HCT: 48 % (ref 39.0–52.0)
Hemoglobin: 16.3 g/dL (ref 13.0–17.0)
O2 Saturation: 97 %
Potassium: 4.6 mmol/L (ref 3.5–5.1)
Sodium: 133 mmol/L — ABNORMAL LOW (ref 135–145)
TCO2: 37 mmol/L — ABNORMAL HIGH (ref 22–32)
pCO2 arterial: 105.5 mmHg (ref 32–48)
pH, Arterial: 7.115 — CL (ref 7.35–7.45)
pO2, Arterial: 125 mmHg — ABNORMAL HIGH (ref 83–108)

## 2023-12-09 LAB — BLOOD GAS, ARTERIAL
Acid-Base Excess: 0.8 mmol/L (ref 0.0–2.0)
Bicarbonate: 31.1 mmol/L — ABNORMAL HIGH (ref 20.0–28.0)
Drawn by: 137461
O2 Saturation: 100 %
Patient temperature: 36.4
pCO2 arterial: 74 mmHg (ref 32–48)
pH, Arterial: 7.23 — ABNORMAL LOW (ref 7.35–7.45)
pO2, Arterial: 238 mmHg — ABNORMAL HIGH (ref 83–108)

## 2023-12-09 LAB — BASIC METABOLIC PANEL WITH GFR
Anion gap: 10 (ref 5–15)
BUN: 60 mg/dL — ABNORMAL HIGH (ref 8–23)
CO2: 33 mmol/L — ABNORMAL HIGH (ref 22–32)
Calcium: 9.7 mg/dL (ref 8.9–10.3)
Chloride: 91 mmol/L — ABNORMAL LOW (ref 98–111)
Creatinine, Ser: 2.33 mg/dL — ABNORMAL HIGH (ref 0.61–1.24)
GFR, Estimated: 28 mL/min — ABNORMAL LOW (ref >60)
Glucose, Bld: 221 mg/dL — ABNORMAL HIGH (ref 70–99)
Potassium: 4.3 mmol/L (ref 3.5–5.1)
Sodium: 134 mmol/L — ABNORMAL LOW (ref 135–145)

## 2023-12-09 LAB — CORTISOL-AM, BLOOD: Cortisol - AM: 21.8 ug/dL (ref 6.7–22.6)

## 2023-12-09 LAB — GLUCOSE, CAPILLARY
Glucose-Capillary: 199 mg/dL — ABNORMAL HIGH (ref 70–99)
Glucose-Capillary: 185 mg/dL — ABNORMAL HIGH (ref 70–99)
Glucose-Capillary: 147 mg/dL — ABNORMAL HIGH (ref 70–99)
Glucose-Capillary: 177 mg/dL — ABNORMAL HIGH (ref 70–99)
Glucose-Capillary: 152 mg/dL — ABNORMAL HIGH (ref 70–99)
Glucose-Capillary: 161 mg/dL — ABNORMAL HIGH (ref 70–99)
Glucose-Capillary: 213 mg/dL — ABNORMAL HIGH (ref 70–99)

## 2023-12-09 LAB — AMMONIA: Ammonia: 27 umol/L (ref 9–35)

## 2023-12-09 LAB — LACTIC ACID, PLASMA
Lactic Acid, Venous: 0.7 mmol/L (ref 0.5–1.9)
Lactic Acid, Venous: 0.7 mmol/L (ref 0.5–1.9)

## 2023-12-09 LAB — MAGNESIUM: Magnesium: 2 mg/dL (ref 1.7–2.4)

## 2023-12-09 LAB — MRSA NEXT GEN BY PCR, NASAL: MRSA by PCR Next Gen: NOT DETECTED

## 2023-12-09 SURGERY — RIGHT HEART CATH
Anesthesia: LOCAL

## 2023-12-09 MED ORDER — LINEZOLID 600 MG/300ML IV SOLN
600.0000 mg | Freq: Two times a day (BID) | INTRAVENOUS | Status: DC
  Administered 2023-12-09 – 2023-12-10 (×3): 600 mg via INTRAVENOUS
  Filled 2023-12-09 (×5): qty 300

## 2023-12-09 MED ORDER — IPRATROPIUM-ALBUTEROL 0.5-2.5 (3) MG/3ML IN SOLN: 3.0000 mL | Freq: Four times a day (QID) | RESPIRATORY_TRACT | Status: DC | PRN

## 2023-12-09 MED ORDER — LIDOCAINE HCL (PF) 1 % IJ SOLN
INTRAMUSCULAR | Status: AC
  Filled 2023-12-09: qty 30

## 2023-12-09 MED ORDER — POLYETHYLENE GLYCOL 3350 17 G PO PACK: 17.0000 g | PACK | Freq: Every day | ORAL | Status: DC | PRN

## 2023-12-09 MED ORDER — DEXMEDETOMIDINE HCL IN NACL 400 MCG/100ML IV SOLN: 0.0000 ug/kg/h | INTRAVENOUS | Status: DC

## 2023-12-09 MED ORDER — MILRINONE LACTATE IN DEXTROSE 20-5 MG/100ML-% IV SOLN
0.1250 ug/kg/min | INTRAVENOUS | Status: DC
  Administered 2023-12-09: 0.25 ug/kg/min via INTRAVENOUS
  Administered 2023-12-10: 0.125 ug/kg/min via INTRAVENOUS
  Administered 2023-12-10: 0.25 ug/kg/min via INTRAVENOUS
  Filled 2023-12-09 (×4): qty 100

## 2023-12-09 MED ORDER — FUROSEMIDE 10 MG/ML IJ SOLN
80.0000 mg | Freq: Two times a day (BID) | INTRAMUSCULAR | Status: AC
  Administered 2023-12-09 – 2023-12-10 (×2): 80 mg via INTRAVENOUS
  Filled 2023-12-09 (×2): qty 8

## 2023-12-09 MED ORDER — NOREPINEPHRINE 4 MG/250ML-% IV SOLN
0.0000 ug/min | INTRAVENOUS | Status: DC
  Administered 2023-12-09: 16 ug/min via INTRAVENOUS
  Administered 2023-12-09: 2 ug/min via INTRAVENOUS
  Administered 2023-12-10: 18 ug/min via INTRAVENOUS
  Administered 2023-12-10: 16 ug/min via INTRAVENOUS
  Administered 2023-12-10: 18 ug/min via INTRAVENOUS
  Filled 2023-12-09 (×6): qty 250

## 2023-12-09 MED ORDER — DOCUSATE SODIUM 100 MG PO CAPS: 100.0000 mg | ORAL_CAPSULE | Freq: Two times a day (BID) | ORAL | Status: DC | PRN

## 2023-12-09 MED ORDER — MIDODRINE HCL 5 MG PO TABS
10.0000 mg | ORAL_TABLET | Freq: Three times a day (TID) | ORAL | Status: DC
  Administered 2023-12-09 (×2): 10 mg via ORAL
  Filled 2023-12-09 (×2): qty 2

## 2023-12-09 MED ORDER — INSULIN ASPART 100 UNIT/ML IJ SOLN
0.0000 [IU] | INTRAMUSCULAR | Status: DC
  Administered 2023-12-09: 3 [IU] via SUBCUTANEOUS
  Administered 2023-12-09: 5 [IU] via SUBCUTANEOUS
  Administered 2023-12-09 – 2023-12-10 (×2): 3 [IU] via SUBCUTANEOUS
  Administered 2023-12-10: 5 [IU] via SUBCUTANEOUS
  Administered 2023-12-10 – 2023-12-11 (×4): 3 [IU] via SUBCUTANEOUS
  Administered 2023-12-11 (×3): 2 [IU] via SUBCUTANEOUS
  Administered 2023-12-11: 3 [IU] via SUBCUTANEOUS
  Administered 2023-12-11: 5 [IU] via SUBCUTANEOUS
  Administered 2023-12-12: 3 [IU] via SUBCUTANEOUS
  Administered 2023-12-12: 5 [IU] via SUBCUTANEOUS

## 2023-12-09 MED ORDER — IPRATROPIUM-ALBUTEROL 0.5-2.5 (3) MG/3ML IN SOLN
3.0000 mL | Freq: Four times a day (QID) | RESPIRATORY_TRACT | Status: DC
  Administered 2023-12-09 – 2023-12-15 (×23): 3 mL via RESPIRATORY_TRACT
  Filled 2023-12-09 (×24): qty 3

## 2023-12-09 MED ORDER — MIDODRINE HCL 5 MG PO TABS
10.0000 mg | ORAL_TABLET | Freq: Once | ORAL | Status: AC
  Administered 2023-12-09: 10 mg via ORAL
  Filled 2023-12-09: qty 2

## 2023-12-09 MED ORDER — SODIUM CHLORIDE 0.9 % IV SOLN
2.0000 g | INTRAVENOUS | Status: DC
  Administered 2023-12-09 – 2023-12-10 (×2): 2 g via INTRAVENOUS
  Filled 2023-12-09 (×2): qty 20

## 2023-12-09 MED ORDER — CHLORHEXIDINE GLUCONATE CLOTH 2 % EX PADS
6.0000 | MEDICATED_PAD | Freq: Every day | CUTANEOUS | Status: DC
  Administered 2023-12-09 – 2023-12-18 (×9): 6 via TOPICAL

## 2023-12-09 MED ORDER — LIDOCAINE HCL (PF) 1 % IJ SOLN
INTRAMUSCULAR | Status: DC | PRN
  Administered 2023-12-09: 5 mL
  Administered 2023-12-09: 2 mL

## 2023-12-09 MED ORDER — HEPARIN (PORCINE) IN NACL 1000-0.9 UT/500ML-% IV SOLN
INTRAVENOUS | Status: DC | PRN
Start: 2023-12-09 — End: 2023-12-09
  Administered 2023-12-09 (×2): 500 mL

## 2023-12-09 MED ORDER — ACETAZOLAMIDE 250 MG PO TABS
500.0000 mg | ORAL_TABLET | Freq: Once | ORAL | Status: AC
  Administered 2023-12-09: 500 mg via ORAL
  Filled 2023-12-09: qty 2

## 2023-12-09 SURGICAL SUPPLY — 8 items
CATH SWAN GANZ 7F STRAIGHT (CATHETERS) IMPLANT
KIT CV 3L 7FR 20CM SULFAFREE (SET/KITS/TRAYS/PACK) IMPLANT
KIT HEART LEFT (KITS) IMPLANT
KIT MICROPUNCTURE NIT STIFF (SHEATH) IMPLANT
PACK CARDIAC CATHETERIZATION (CUSTOM PROCEDURE TRAY) ×2 IMPLANT
SHEATH GLIDE SLENDER 4/5FR (SHEATH) IMPLANT
SHEATH PINNACLE 7F 10CM (SHEATH) IMPLANT
SHEATH PROBE COVER 6X72 (BAG) IMPLANT

## 2023-12-09 NOTE — Progress Notes (Signed)
 eLink Physician-Brief Progress Note Patient Name: Robert Ramirez DOB: 1945-09-23 MRN: [id number]   Date of Service  12/09/2023  HPI/Events of Note  78 year old man history of hypertension CAD heart failure preserved ejection fraction with RV dysfunction, complete heart block status post pacemaker 05/2023, OSA, pleural plaques without ILD, chronic hypercarbia who presents to the ED with complaint of weakness, transferred to the ICU after right heart catheterization with respiratory distress and worsening hypercarbia.   Having some increasing PVCs since redressing a central line  eICU Interventions  Central line okay to use Will replete electrolytes with next set of labs    0230 - Electrolytes replete,  still having frequent PVC's, irregular rhythm  Intervention Category Minor Interventions: Routine modifications to care plan (e.g. PRN medications for pain, fever)  Raphael Espe 12/09/2023, 11:49 PM

## 2023-12-09 NOTE — Consult Note (Signed)
 NAME:  Robert Ramirez, MRN:  [id number], DOB:  1945/09/27, LOS: 2 ADMISSION DATE:  12/07/2023, CONSULTATION DATE:  7/16 REFERRING MD:  Dr. Cherrie, AHF CHIEF COMPLAINT:  RV failure, respiratory failure   History of Present Illness:  78 year old male with past medical history of diabetes, hypercholesterolemia, hypertension, CAD, HFpEF with RV failure, complete heart block s/p dual chamber PPM 05/2023, OSA and chronic respiratory failure on BiPAP at home, asbestos exposure with pleural plaque without ILD who presented to the emergency department on 12/07/23 with complaint of weakness for 2 weeks. Noted increased LE edema, EMS noting O2 sat ~92% requiring supplemental O2. In ED, afebrile, soft BP ~90s systolic, BNP 1195, WBC 18, sCr 1.8. Given Ancef  for concern of lower extremity cellulitis and was admitted to hospitalist. TRH started patient on BID Lasix  and repeated his echo demonstrating LVEF 60-65%, RV enlarged, estimated RVSP of 58. 7/15 early AM had soft BP. TRH gave patient 250cc LR bolus. Improved pressure which drifted back down so given additional 500cc bolus then 250 again for total of 1L IVF. 7/15 morning, AHF was consulted with worsened RV failure from previous admission, and recommended RHC, started on midodrine . 7/16, drowsy and disoriented, SOB and sCr increased with diuresis. Went for RHC and had ABG performed showing pCO2 >100. CCM was consulted to cath lab and patient was placed on BiPAP. AHF requesting ICU bed and CCM consult.   Pertinent  Medical History  history of diabetes, hypercholesterolemia, hypertension, CAD, HFpEF with RV failure, complete heart block s/p dual chamber PPM 05/2023, OSA and chronic respiratory failure on BiPAP at home, asbestos exposure with pleural plaque without ILD  Significant Hospital Events: Including procedures, antibiotic start and stop dates in addition to other pertinent events   7/14: admit for heart failure, started on antibiotics for cellulitis  coverage; echo with worsened RV failure  7/15: soft BP, given 1L IVF with initial improvement. Started on midodrine  and AHF consult  7/16: drowsy, disoriented, pCO2 high in cath lab, started on BiPAP. AHF request for CCM involvement and transfer to ICU.   Interim History / Subjective:  Arrives to ICU on BiPAP. Opens eyes to sternal rub and follows commands. Pressure soft.   Objective   Blood pressure (!) 93/54, pulse 89, temperature 97.6 F (36.4 C), temperature source Oral, resp. rate 16, height 6' 1 (1.854 m), weight 85.8 kg, SpO2 90%.        Intake/Output Summary (Last 24 hours) at 12/09/2023 1313 Last data filed at 12/08/2023 1728 Gross per 24 hour  Intake 240 ml  Output --  Net 240 ml   Filed Weights   12/07/23 0746 12/09/23 0440  Weight: 79.4 kg 85.8 kg    Examination: General: older male, acute on chronically ill appearing  HENT: perrla, ncat, large skin lesion to the right posterior scalp and neck, BiPAP  Lungs: diminished bilaterally, BiPAP  Cardiovascular: s1s2, cannot appreciate murmur  Abdomen: soft, hypoactive bowel sounds  Extremities: bilateral lower extremity 1+ pitting edema, BLE cellulitis red and warm  Neuro: drowsy, wakes to physical stimulus, follows commands  GU: no foley   Resolved Hospital Problem list    Assessment & Plan:  Acute on chronic hypoxic, hypercapneic respiratory failure  OSA on BiPAP at home  Chronic pleural plaque c/w prior granulomatous disease Pulmonary hypertension, WHO Class III  Wears O2 at home as needed, BiPAP at night. Has chronic pleural plaque. Saw pulm in 2020 with PFT mixed obstructive and restrictive. Initially presented with O2 ~92 requiring supplemental  O2. Noted to be drowsy, disoriented 7/16 AM. Gas with pH 7.1, pCO2 >100   - BiPAP now, continuous  - ABG now, and titrate  - airway monitoring  - continuous pulse ox  - scheduled and prn duoneb   Shock; likely mixed cardiogenic>septic in setting of decompensated RV  failure, bilateral lower extremity cellulitis  - on linezolid , rocephin  for cellulitis  - send off coox, lactic acid now  - norepi for MAP > 65 - AHF on board to help for possible inotropic support as needed  - a-line placed by AHF, trend  - trend wbc, lactic, fever curve   Decompensated HFpEF  RV failure  CHB s/p dual chamber PPM 05/2023 Echo on admission showing LVEF 60-65%, RV enlarged, estimated RVSP of 58. Decompensated from previous RHC 7/16 showing elevated RA pressures, pulm HTN. Hypotensive on arrival to unit - AHF following, appreciate help in management  - obtain lactic acid, coox  - start low dose NE to obtain MAP > 65  - defer to AHF for inotropic support depending on above labs  - lasix  80mg  IV BID  - midodrine  10mg  TID   Acute kidney injury 2/2 cardiorenal syndrome  No UTI on admission. Likely cardiorenal in setting of decompensated right heart failure.  - lasix  80mg  IV BID  - trend bmp, mag, phos - replete elytes - strict I&O - Avoid nephrotoxic agents, renally dose medications - ensure adequate renal perfusion  - I doubt he would be a good long term dialysis candidate, anticipate he may have CRRT need upcoming. Can decide at that time if he is appropriate for short term trial. Palliative may help with this for GOC.   Sepsis 2/2 bilateral lower extremity cellulitis  - linezolid , rocephin  (anticipate at least 10 days treatment)  - trend wbc, fever, lactic curve  - can get bilateral LE venous duplex to R/O DVT, although low suspicion   Acute metabolic encephalopathy  Drowsy, disoriented 7/16 AM. Ammonia normal. pCO2 >100  - likely related to CO2 retention.  - BiPAP  - frequent neuro checks  - repeat ABG    Hypertension  HLD  - no antiHTN with cardiogenic shock  - continue zetia , statin  Diabetes  - cbg q4h  - ssi   Skin lesion  Large skin lesion to right scalp and neck. Refused to see derm in 2019. 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   Best Practice (right click and Reselect all SmartList Selections daily)   Diet/type: NPO DVT prophylaxis: LMWH GI prophylaxis: N/A Lines: Central line, Arterial Line, and yes and it is still needed Foley:  N/A Code Status:  full code Last date of multidisciplinary goals of care discussion [pending]  Labs   CBC: Recent Labs  Lab 12/07/23 0758 12/07/23 0801 12/08/23 0401 12/09/23 0242  WBC  --  18.2* 16.0* 14.8*  NEUTROABS  --  16.1*  --  12.8*  HGB 17.0 16.4 14.3 14.1  HCT 50.0 51.1 46.4 46.0  MCV  --  93.1 94.7 94.5  PLT  --  165 163 192    Basic Metabolic Panel: Recent Labs  Lab 12/07/23 0758 12/07/23 0801 12/08/23 0401 12/09/23 0242  NA 135 134* 138 134*  K 4.8 4.6 3.9 4.3  CL 93* 91* 96* 91*  CO2  --  32 33* 33*  GLUCOSE 279* 279* 113* 221*  BUN 37* 32* 39* 60*  CREATININE 1.60* 1.80* 1.67* 2.33*  CALCIUM   --  9.9 9.8 9.7  MG  --   --   --  2.0   GFR: Estimated Creatinine Clearance: 30.5 mL/min (A) (by C-G formula based on SCr of 2.33 mg/dL (H)). Recent Labs  Lab 12/07/23 0801 12/08/23 0401 12/08/23 0714 12/09/23 0242  WBC 18.2* 16.0*  --  14.8*  LATICACIDVEN  --   --  0.9  --     Liver Function Tests: Recent Labs  Lab 12/07/23 0801  AST 28  ALT 16  ALKPHOS 106  BILITOT 2.5*  PROT 6.1*  ALBUMIN 3.0*   No results for input(s): LIPASE, AMYLASE in the last 168 hours. Recent Labs  Lab 12/09/23 0934  AMMONIA 27    ABG    Component Value Date/Time   PHART 7.384 03/19/2018 0925   PCO2ART 61.0 (H) 03/19/2018 0925   PO2ART 75.4 (L) 03/19/2018 0925   HCO3 35.6 (H) 03/19/2018 0925   TCO2 32 12/07/2023 0758   O2SAT 95.7 03/19/2018 0925     Coagulation Profile: No results for input(s): INR, PROTIME in the last 168 hours.  Cardiac Enzymes: No results for  input(s): CKTOTAL, CKMB, CKMBINDEX, TROPONINI in the last 168 hours.  HbA1C: Hemoglobin A1C  Date/Time Value Ref Range Status  09/30/2023 11:18 AM 8.0 (A) 4.0 - 5.6 % Final  08/04/2022 02:50 PM 7.6 (A) 4.0 - 5.6 % Final   HbA1c, POC (controlled diabetic range)  Date/Time Value Ref Range Status  03/15/2021 10:01 AM 8.3 (A) 0.0 - 7.0 % Final   Hgb A1c MFr Bld  Date/Time Value Ref Range Status  06/12/2023 06:14 PM 7.5 (H) 4.8 - 5.6 % Final    Comment:    (NOTE) Pre diabetes:          5.7%-6.4%  Diabetes:              >6.4%  Glycemic control for   <7.0% adults with diabetes   01/28/2022 02:00 PM 8.2 (H) 4.8 - 5.6 % Final    Comment:             Prediabetes: 5.7 - 6.4          Diabetes: >6.4          Glycemic control for adults with diabetes: <7.0     CBG: Recent Labs  Lab 12/08/23 1555 12/08/23 2103 12/09/23 0603 12/09/23 0728 12/09/23 1100  GLUCAP 168* 242* 199* 185* 147*    Review of Systems:   As above   Past Medical History:  He,  has a past medical history of Diabetes mellitus, High cholesterol, and Hypertension.   Surgical History:   Past Surgical History:  Procedure Laterality Date   APPENDECTOMY     PACEMAKER IMPLANT N/A 06/12/2023   Procedure: PACEMAKER IMPLANT;  Surgeon: Kennyth Chew, MD;  Location: [facility] INVASIVE CV LAB;  Service: Cardiovascular;  Laterality: N/A;   RIGHT/LEFT HEART CATH AND CORONARY ANGIOGRAPHY N/A 03/17/2018   Procedure: RIGHT/LEFT HEART CATH AND CORONARY ANGIOGRAPHY;  Surgeon: Claudene Pacific, MD;  Location: [facility] INVASIVE CV LAB;  Service: Cardiovascular;  Laterality: N/A;     Social History:   reports that he quit smoking about 43 years ago. His smoking use included cigarettes. He has never been exposed to tobacco smoke. His smokeless tobacco use includes chew and snuff. He reports that he does not drink alcohol and does not use drugs.  Family History:  His family history includes Cancer in his father and sister; Diabetes  in his sister; Heart failure in his mother.   Allergies No Known Allergies   Home Medications  Prior to Admission medications   Medication Sig Start Date End Date Taking? Authorizing Provider  amLODipine  (NORVASC ) 10 MG tablet Take 1 tablet (10 mg total) by mouth daily. 08/04/22  Yes Lorren, Amy J, NP  aspirin  EC 81 MG tablet Take 81 mg by mouth daily. Swallow whole.   Yes [provider]  atorvastatin  (LIPITOR) 40 MG tablet Take 40 mg by mouth daily.   Yes [provider]  chlorthalidone  (HYGROTON ) 25 MG tablet TAKE 1 TABLET EVERY DAY 10/16/22  Yes Thukkani, Arun K, MD  Cholecalciferol (VITAMIN D-3) 25 MCG (1000 UT) CAPS Take 1 capsule by mouth every morning.   Yes [provider]  empagliflozin  (JARDIANCE ) 10 MG TABS tablet Take 1 tablet (10 mg total) by mouth daily before breakfast. 05/07/23  Yes Thukkani, Arun K, MD  ezetimibe  (ZETIA ) 10 MG tablet Take 1 tablet (10 mg total) by mouth daily. 11/05/23  Yes Thukkani, Arun K, MD  Insulin  NPH, Human,, Isophane, (HUMULIN  N KWIKPEN) 100 UNIT/ML Kiwkpen Inject 33 Units into the skin in the morning and at bedtime. 10/02/23  Yes Newlin, Enobong, MD  levothyroxine  (SYNTHROID ) 50 MCG tablet Take 1 tablet (50 mcg total) by mouth daily. 09/30/23 12/29/23 Yes Lorren, Amy J, NP  lisinopril  (ZESTRIL ) 40 MG tablet TAKE 1 TABLET EVERY DAY 10/16/22  Yes Thukkani, Arun K, MD  metFORMIN  (GLUCOPHAGE ) 1000 MG tablet Take 1 tablet (1,000 mg total) by mouth 2 (two) times daily with a meal. 09/30/23 12/29/23 Yes Lorren, Amy J, NP  nitroGLYCERIN  (NITROSTAT ) 0.4 MG SL tablet Place 1 tablet (0.4 mg total) under the tongue every 5 (five) minutes as needed for chest pain. 03/29/21  Yes Thukkani, Arun K, MD  spironolactone  (ALDACTONE ) 25 MG tablet TAKE 1 TABLET EVERY DAY Patient taking differently: Take 12.5 mg by mouth daily. 03/12/23  Yes Thukkani, Arun K, MD  vitamin B-12 (CYANOCOBALAMIN ) 500 MCG tablet Take 500 mcg by mouth daily.   Yes [provider]  blood glucose meter kit and supplies Dispense based on patient and insurance preference. Use up to four times daily as directed. (FOR ICD-10 E10.9, E11.9). 02/08/22   Lorren Greig PARAS, NP  Insulin  Pen Needle (DROPLET PEN NEEDLES) 31G X 8 MM MISC 1 each by Other route 2 (two) times daily with a meal. 08/04/22   Lorren Greig PARAS, NP     Critical care time: 40    Tinnie FORBES Adolph DEVONNA [facility] 12/09/23 1:31 PM  Please see [email or web address] for pager details.  From 7A-7P if no response, please call [phone] After hours, please call ELink [phone]

## 2023-12-09 NOTE — Progress Notes (Signed)
 [facility] night cross cover note:   I was notified by the patient's RN that the patient's systolic blood pressures are slightly lower, with systolic value into the mid 80s, relative to the systolic blood pressures in the 90s with very occasional systolic blood pressure into the low 100s throughout dayshift.  No new symptoms reported from patient at this time.  Other vital signs appear stable, including afebrile, with heart rates in the 80s to 90s, respiratory rate 18, and oxygen saturation in the mid to high 90s on 3 to 4 L nasal cannula, which appears unchanged from degree of supplemental oxygen provision during dayshift.  In the setting of presenting acute on chronic diastolic heart failure with evidence of acute right-sided heart failure, will refrain from additional IV fluids at this time.  Rather, it appears that midodrine  was initiated during his most recent dayshift, and that there was a slight increase in systolic blood pressure following receipt of first dose of such.  In light of these details, I have asked that the patient's next dose of midodrine  10 mg be given at this time.  Will subsequently continue with midodrine  10 mg p.o. 3 times daily, and closely monitor ensuing blood pressure trend.   Update: Ensuing systolic blood pressures have been in the 90's mmHg following most recent dose of midodrine , as above, and appear consistent with SBP's throughout yesterday's day shift.  Will continue to monitor and continue midodrine  as above.      Eva Pore, DO Hospitalist

## 2023-12-09 NOTE — Interval H&P Note (Signed)
 History and Physical Interval Note:  12/09/2023 10:53 AM  Robert Ramirez  has presented today for surgery, with the diagnosis of pulmonary hypertension.  The various methods of treatment have been discussed with the patient and family. After consideration of risks, benefits and other options for treatment, the patient has consented to  Procedure(s): RIGHT HEART CATH (N/A) as a surgical intervention.  The patient's history has been reviewed, patient examined, no change in status, stable for surgery.  I have reviewed the patient's chart and labs.  Questions were answered to the patient's satisfaction.     Rein Popov

## 2023-12-09 NOTE — H&P (View-Only) (Signed)
 Advanced Heart Failure Rounding Note  Cardiologist: Arun K Thukkani, MD  Chief Complaint: RV Failure / HFpEF / PH Subjective:    RHC this morning. BP remains soft 80-90/50s on midodrine .  Cr significantly up with diuresis 1.67>2.33. 1 urine occurrence documented overnight.   Sitting up in bed. Drowsy, disoriented to situation and time. Reports that he is still feeling very poor. + SOB.   Objective:    Weight Range: 85.8 kg Body mass index is 24.96 kg/m.   Vital Signs:   Temp:  [97.7 F (36.5 C)-98.5 F (36.9 C)] 97.7 F (36.5 C) (07/16 0426) Pulse Rate:  [75-87] 87 (07/16 0426) Resp:  [18-26] 18 (07/16 0426) BP: (91-102)/(47-80) 95/52 (07/16 0426) SpO2:  [94 %-99 %] 97 % (07/16 0426) Weight:  [85.8 kg] 85.8 kg (07/16 0440) Last BM Date : 12/08/23  Weight change: Filed Weights   12/07/23 0746 12/09/23 0440  Weight: 79.4 kg 85.8 kg   Intake/Output:  Intake/Output Summary (Last 24 hours) at 12/09/2023 0803 Last data filed at 12/08/2023 1728 Gross per 24 hour  Intake 490 ml  Output --  Net 490 ml    Physical Exam    General: Elderly, frail appearing. No distress on [place] Cardiac: JVP to jaw. S1 and S2 present. No murmurs or rub. Abdomen: Taut, non-tender, distended.  Extremities: Warm and dry.  3+ BLE edema. + warm, red BLE Neuro: Drowsy, disoriented to time and situation  Telemetry   VP 90s, frequent PACs (personally reviewed)  Labs    CBC Recent Labs    12/07/23 0801 12/08/23 0401 12/09/23 0242  WBC 18.2* 16.0* 14.8*  NEUTROABS 16.1*  --  12.8*  HGB 16.4 14.3 14.1  HCT 51.1 46.4 46.0  MCV 93.1 94.7 94.5  PLT 165 163 192   Basic Metabolic Panel Recent Labs    92/84/74 0401 12/09/23 0242  NA 138 134*  K 3.9 4.3  CL 96* 91*  CO2 33* 33*  GLUCOSE 113* 221*  BUN 39* 60*  CREATININE 1.67* 2.33*  CALCIUM  9.8 9.7  MG  --  2.0   Liver Function Tests Recent Labs    12/07/23 0801  AST 28  ALT 16  ALKPHOS 106  BILITOT 2.5*  PROT 6.1*   ALBUMIN 3.0*   BNP (last 3 results) Recent Labs    12/07/23 0816  BNP 1,195.7*   Thyroid  Function Tests Recent Labs    12/07/23 1000  TSH 2.423   Medications:    Scheduled Medications:  aspirin  EC  81 mg Oral Daily   atorvastatin   40 mg Oral Daily   enoxaparin  (LOVENOX ) injection  40 mg Subcutaneous Q24H   ezetimibe   10 mg Oral Daily   feeding supplement  237 mL Oral BID BM   furosemide   80 mg Intravenous BID   insulin  aspart  0-9 Units Subcutaneous TID WC   levothyroxine   50 mcg Oral Q0600   midodrine   10 mg Oral TID WC   sodium chloride  flush  3 mL Intravenous Q12H   Infusions:  sodium chloride  10 mL/hr at 12/09/23 9472   ceFEPime  (MAXIPIME ) IV 2 g (12/09/23 0526)   vancomycin  1,000 mg (12/08/23 2031)   PRN Medications: acetaminophen  **OR** acetaminophen , albuterol   Patient Profile   78 y.o. male with history of CAD, HFpEF with RV failure, DM, HTN, HLD, OSA and chronic respiratory failure on BiPAP, CHB s/p dual chamber PPM with LBBAP lead in 01/25, hx asbestos exposure with pleural plaque on prior HRCT but no  ILD, remote tobacco use.   Admitted with acute on chronic HFpEF with RV failure.  Assessment/Plan   Acute on chronic HFpEF with RV failure Pulmonary hypertension - Evidence of RV dysfunction on echo dating back to 2019.  - L/RHC in 02/2018: Nonobstructive CAD involving RCA, PCWP mean 8, PA 46/16 (26), Fick CO/CI 4.45/2.07, TD CO/CI 3.64/1.69.  - Echo with LVEF 60-65%, interventricular septum is flattened, RV enlarged and HK, RVSP 58 mmHg, moderate TR - Suspect WHO group III pulmonary hypertension in setting of longstanding respiratory failure/OSA. Reports adherence with BiPAP.  - Prev ILD w/u unimpressive: PFTs in 2020 with mixed restrictive and obstructive defect. CT w/o ILD eivdence - He is volume overloaded. Trouble tolerating diuresis d/t hypotension.  - Continue Lasix  80 mg IV bid + diamox  500 mg this am. Oliguric, will need to escalate diuresis in  attempt at volume removal post-cath. If unable to diurese, CVVHD may be needed acutely for decongestion. Would benefit from Palliative Care and GOC discussion.  - Continue Midodrine  10 mg tid - Plan for RHC tomorrow   2. AKI on CKD IIIa - Scr baseline 1.3-1.4, up to 1.8 - Suspect cardiorenal syndrome.  - Significant bump in Cr with oliguria 1.6>2.33 with diuresis. Will attempt aggressive diuresis pending cath results, if unsuccessful, will need to discuss CVVHD options as well as goals of care.    3. Possible cellulitis - Abx per primary team - Afebrile but does have leukocytosis   4. CHB s/p dual chamber PPM w/ LBBAP lead Possible atrial fibrillation - ? Sinus rhythm with PACs vs atrial fibrillation - Device interrogated by [facility]. Jude rep. Several very brief runs of atrial flutter noted, just seconds in duration. Will need anticoagulation if burden increases.   5. Hyperbilirubinemia - T bili 2.5 - RUQ US  w/o cirrhosis  Length of Stay: 2  [place] Lee, NP  12/09/2023, 8:03 AM  Advanced Heart Failure Team Pager [phone] (M-F; 7a - 5p)  Please contact [facility] for night-coverage after hours (5p -7a ) and weekends on [email or web address]  Patient seen and examined with the above-signed Advanced Practice Provider and/or Housestaff. I personally reviewed laboratory data, imaging studies and relevant notes. I independently examined the patient and formulated the important aspects of the plan. I have edited the note to reflect any of my changes or salient points. I have personally discussed the plan with the patient and/or family.  No response to IV lasix . Scr worse. BP low. Feels miserable. SOB/weak.   General:  Lethargic. Ill-appearing HEENT: normal Neck: supple. JVP to jaw Cor:REG 2/6 TR Lungs: clear Abdomen: soft, nontender, nondistended. No hepatosplenomegaly. No bruits or masses. Good bowel sounds. Extremities: no cyanosis, clubbing, rash, 3+ edema + RLE cellulitis Neuro: lethargic  but arousable Moves all 4  Suspect end-stage PAH/cor pulmonale. Will take to cath lab today for RHC to assess hemodynamics. Suspect palliative care may be only option.  Hold diuretics for now.   Toribio Fuel, MD  10:48 AM

## 2023-12-09 NOTE — Inpatient Diabetes Management (Signed)
 Inpatient Diabetes Program Recommendations  AACE/ADA: New Consensus Statement on Inpatient Glycemic Control   Target Ranges:  Prepandial:   less than 140 mg/dL      Peak postprandial:   less than 180 mg/dL (1-2 hours)      Critically ill patients:  140 - 180 mg/dL    Latest Reference Range & Units 12/08/23 08:31 12/08/23 10:03 12/08/23 11:42 12/08/23 12:13 12/08/23 12:53 12/08/23 15:55 12/08/23 21:03 12/09/23 06:03 12/09/23 07:28  Glucose-Capillary 70 - 99 mg/dL 60 (L) 87 66 (L) 67 (L) 110 (H) 168 (H) 242 (H) 199 (H) 185 (H)   Review of Glycemic Control  Diabetes history: DM2 Outpatient Diabetes medications: NPH 33 untis BID, Metformin  1000 mg BID, Jardiance  10 mg QAM Current orders for Inpatient glycemic control: Novolog  0-9 units TID with meals  Inpatient Diabetes Program Recommendations:    Insulin : Patient received NPH 30 units at 23:55 on 12/07/23 and experienced hypoglycemia on 12/08/23.  CBG 199 mg/dl this morning. If CBGs remained consistently over 180 mg/dl, please consider ordering Semglee  5 units Q24H.  Thanks, Earnie Gainer, RN, MSN, CDCES Diabetes Coordinator Inpatient Diabetes Program [phone] (Team Pager from 8am to 5pm)

## 2023-12-09 NOTE — Progress Notes (Addendum)
 Advanced Heart Failure Rounding Note  Cardiologist: Arun K Thukkani, MD  Chief Complaint: RV Failure / HFpEF / PH Subjective:    RHC this morning. BP remains soft 80-90/50s on midodrine .  Cr significantly up with diuresis 1.67>2.33. 1 urine occurrence documented overnight.   Sitting up in bed. Drowsy, disoriented to situation and time. Reports that he is still feeling very poor. + SOB.   Objective:    Weight Range: 85.8 kg Body mass index is 24.96 kg/m.   Vital Signs:   Temp:  [97.7 F (36.5 C)-98.5 F (36.9 C)] 97.7 F (36.5 C) (07/16 0426) Pulse Rate:  [75-87] 87 (07/16 0426) Resp:  [18-26] 18 (07/16 0426) BP: (91-102)/(47-80) 95/52 (07/16 0426) SpO2:  [94 %-99 %] 97 % (07/16 0426) Weight:  [85.8 kg] 85.8 kg (07/16 0440) Last BM Date : 12/08/23  Weight change: Filed Weights   12/07/23 0746 12/09/23 0440  Weight: 79.4 kg 85.8 kg   Intake/Output:  Intake/Output Summary (Last 24 hours) at 12/09/2023 0803 Last data filed at 12/08/2023 1728 Gross per 24 hour  Intake 490 ml  Output --  Net 490 ml    Physical Exam    General: Elderly, frail appearing. No distress on [place] Cardiac: JVP to jaw. S1 and S2 present. No murmurs or rub. Abdomen: Taut, non-tender, distended.  Extremities: Warm and dry.  3+ BLE edema. + warm, red BLE Neuro: Drowsy, disoriented to time and situation  Telemetry   VP 90s, frequent PACs (personally reviewed)  Labs    CBC Recent Labs    12/07/23 0801 12/08/23 0401 12/09/23 0242  WBC 18.2* 16.0* 14.8*  NEUTROABS 16.1*  --  12.8*  HGB 16.4 14.3 14.1  HCT 51.1 46.4 46.0  MCV 93.1 94.7 94.5  PLT 165 163 192   Basic Metabolic Panel Recent Labs    92/84/74 0401 12/09/23 0242  NA 138 134*  K 3.9 4.3  CL 96* 91*  CO2 33* 33*  GLUCOSE 113* 221*  BUN 39* 60*  CREATININE 1.67* 2.33*  CALCIUM  9.8 9.7  MG  --  2.0   Liver Function Tests Recent Labs    12/07/23 0801  AST 28  ALT 16  ALKPHOS 106  BILITOT 2.5*  PROT 6.1*   ALBUMIN 3.0*   BNP (last 3 results) Recent Labs    12/07/23 0816  BNP 1,195.7*   Thyroid  Function Tests Recent Labs    12/07/23 1000  TSH 2.423   Medications:    Scheduled Medications:  aspirin  EC  81 mg Oral Daily   atorvastatin   40 mg Oral Daily   enoxaparin  (LOVENOX ) injection  40 mg Subcutaneous Q24H   ezetimibe   10 mg Oral Daily   feeding supplement  237 mL Oral BID BM   furosemide   80 mg Intravenous BID   insulin  aspart  0-9 Units Subcutaneous TID WC   levothyroxine   50 mcg Oral Q0600   midodrine   10 mg Oral TID WC   sodium chloride  flush  3 mL Intravenous Q12H   Infusions:  sodium chloride  10 mL/hr at 12/09/23 9472   ceFEPime  (MAXIPIME ) IV 2 g (12/09/23 0526)   vancomycin  1,000 mg (12/08/23 2031)   PRN Medications: acetaminophen  **OR** acetaminophen , albuterol   Patient Profile   78 y.o. male with history of CAD, HFpEF with RV failure, DM, HTN, HLD, OSA and chronic respiratory failure on BiPAP, CHB s/p dual chamber PPM with LBBAP lead in 01/25, hx asbestos exposure with pleural plaque on prior HRCT but no  ILD, remote tobacco use.   Admitted with acute on chronic HFpEF with RV failure.  Assessment/Plan   Acute on chronic HFpEF with RV failure Pulmonary hypertension - Evidence of RV dysfunction on echo dating back to 2019.  - L/RHC in 02/2018: Nonobstructive CAD involving RCA, PCWP mean 8, PA 46/16 (26), Fick CO/CI 4.45/2.07, TD CO/CI 3.64/1.69.  - Echo with LVEF 60-65%, interventricular septum is flattened, RV enlarged and HK, RVSP 58 mmHg, moderate TR - Suspect WHO group III pulmonary hypertension in setting of longstanding respiratory failure/OSA. Reports adherence with BiPAP.  - Prev ILD w/u unimpressive: PFTs in 2020 with mixed restrictive and obstructive defect. CT w/o ILD eivdence - He is volume overloaded. Trouble tolerating diuresis d/t hypotension.  - Continue Lasix  80 mg IV bid + diamox  500 mg this am. Oliguric, will need to escalate diuresis in  attempt at volume removal post-cath. If unable to diurese, CVVHD may be needed acutely for decongestion. Would benefit from Palliative Care and GOC discussion.  - Continue Midodrine  10 mg tid - Plan for RHC tomorrow   2. AKI on CKD IIIa - Scr baseline 1.3-1.4, up to 1.8 - Suspect cardiorenal syndrome.  - Significant bump in Cr with oliguria 1.6>2.33 with diuresis. Will attempt aggressive diuresis pending cath results, if unsuccessful, will need to discuss CVVHD options as well as goals of care.    3. Possible cellulitis - Abx per primary team - Afebrile but does have leukocytosis   4. CHB s/p dual chamber PPM w/ LBBAP lead Possible atrial fibrillation - ? Sinus rhythm with PACs vs atrial fibrillation - Device interrogated by [facility]. Jude rep. Several very brief runs of atrial flutter noted, just seconds in duration. Will need anticoagulation if burden increases.   5. Hyperbilirubinemia - T bili 2.5 - RUQ US  w/o cirrhosis  Length of Stay: 2  [place] Lee, NP  12/09/2023, 8:03 AM  Advanced Heart Failure Team Pager [phone] (M-F; 7a - 5p)  Please contact [facility] for night-coverage after hours (5p -7a ) and weekends on [email or web address]  Patient seen and examined with the above-signed Advanced Practice Provider and/or Housestaff. I personally reviewed laboratory data, imaging studies and relevant notes. I independently examined the patient and formulated the important aspects of the plan. I have edited the note to reflect any of my changes or salient points. I have personally discussed the plan with the patient and/or family.  No response to IV lasix . Scr worse. BP low. Feels miserable. SOB/weak.   General:  Lethargic. Ill-appearing HEENT: normal Neck: supple. JVP to jaw Cor:REG 2/6 TR Lungs: clear Abdomen: soft, nontender, nondistended. No hepatosplenomegaly. No bruits or masses. Good bowel sounds. Extremities: no cyanosis, clubbing, rash, 3+ edema + RLE cellulitis Neuro: lethargic  but arousable Moves all 4  Suspect end-stage PAH/cor pulmonale. Will take to cath lab today for RHC to assess hemodynamics. Suspect palliative care may be only option.  Hold diuretics for now.   Toribio Fuel, MD  10:48 AM

## 2023-12-09 NOTE — Progress Notes (Addendum)
 PROGRESS NOTE    Robert Ramirez  FMW:[id number] DOB: [birth year] DOA: 12/07/2023 PCP: Lorren Greig PARAS, NP  78/M with history of type 2 diabetes mellitus, CAD, diastolic CHF, RV failure, OSA/OHS on BiPAP, PPM, history of asbestos exposure with pleural plaque, presented to the ED with lower extremity edema and weakness, admitted with CHF exacerbation and cellulitis - Cards following, diuresing, on antibiotics as well - 7/16 complicated by worsening AKI, lethargy, vancomycin  discontinued, RHC   Subjective: -Feels weak, short of breath, tired  Assessment and Plan:  Acute on chronic HFpEF, RV failure, pulmonary HTN:  -Echo with EF 60-65%, flattened IV septum, RV enlarged, RVSP 58, moderate TR -CHF team following, remains volume overloaded, limited response to diuretics thus far -GDMT limited by hypotension, creatinine worsening up to 2.3 today -Plan for right heart cath this morning, continue midodrine  - Monitor BP, urine output, BMP in a.m. -Prognosis appears to be poor, will request palliative eval  Encephalopathy -ammonia is normal, check ABG   Sepsis due to cellulitis of RLE: -Discontinue vancomycin  with worsening AKI, start Zyvox , changed cefepime  to ceftriaxone    Type 2 diabetes mellitus -With episode of hypoglycemia yesterday, now CBGs 140-240, restart meal coverage   Hyperbilirubinemia: No abd tenderness, -Suspect secondary to passive hepatic congestion, repeat LFTs in a.m. - Ultrasound with cholelithiasis and no complicating features, abdominal exam is benign today, monitor -Ammonia levels normal   CHB s/p PPM:    Hypothyroidism: TSH wnl.  - Continue synthroid .    AKI on stage IIIa CKD:  - Likely cardiorenal, vancomycin  could also be contributing, see discussion above   CAD, HLD: Troponin elevation but no chest pain.  - Continue statin/zetia    Chronic respiratory failure OSA OHS History of asbestos exposure and known pleural plaques - Continue BiPAP qHS   DVT  prophylaxis: Lovenox  Code Status: Full code Family Communication: None present Disposition Plan: For prognosis, to be determined  Consultants: CHF team   Objective: Vitals:   12/09/23 0148 12/09/23 0426 12/09/23 0440 12/09/23 0725  BP: (!) 95/54 (!) 95/52  (!) 99/58  Pulse:  87  (!) 27  Resp:  18  17  Temp:  97.7 F (36.5 C)  98 F (36.7 C)  TempSrc:  Oral  Oral  SpO2:  97%  96%  Weight:   85.8 kg   Height:        Intake/Output Summary (Last 24 hours) at 12/09/2023 1110 Last data filed at 12/08/2023 1728 Gross per 24 hour  Intake 240 ml  Output --  Net 240 ml   Filed Weights   12/07/23 0746 12/09/23 0440  Weight: 79.4 kg 85.8 kg    Examination:  General exam: Elderly chronically ill, AAO x 2, mild cognitive deficits, somnolent this am HEENT: Positive JVD Respiratory system: Poor air movement bilaterally Cardiovascular system: S1 & S2 heard, RRR.  Abd: nondistended, soft and nontender.Normal bowel sounds heard. Central nervous system: Mild lethargy Extremities: 2+ edema, right lower extremity with erythema warmth and tenderness, right lower extremity with erythema and tenderness, warmth Skin: As above Psychiatry: Mild somnolence    Data Reviewed:   CBC: Recent Labs  Lab 12/07/23 0758 12/07/23 0801 12/08/23 0401 12/09/23 0242  WBC  --  18.2* 16.0* 14.8*  NEUTROABS  --  16.1*  --  12.8*  HGB 17.0 16.4 14.3 14.1  HCT 50.0 51.1 46.4 46.0  MCV  --  93.1 94.7 94.5  PLT  --  165 163 192   Basic Metabolic Panel: Recent Labs  Lab  12/07/23 0758 12/07/23 0801 12/08/23 0401 12/09/23 0242  NA 135 134* 138 134*  K 4.8 4.6 3.9 4.3  CL 93* 91* 96* 91*  CO2  --  32 33* 33*  GLUCOSE 279* 279* 113* 221*  BUN 37* 32* 39* 60*  CREATININE 1.60* 1.80* 1.67* 2.33*  CALCIUM   --  9.9 9.8 9.7  MG  --   --   --  2.0   GFR: Estimated Creatinine Clearance: 30.5 mL/min (A) (by C-G formula based on SCr of 2.33 mg/dL (H)). Liver Function Tests: Recent Labs  Lab  12/07/23 0801  AST 28  ALT 16  ALKPHOS 106  BILITOT 2.5*  PROT 6.1*  ALBUMIN 3.0*   No results for input(s): LIPASE, AMYLASE in the last 168 hours. Recent Labs  Lab 12/09/23 0934  AMMONIA 27   Coagulation Profile: No results for input(s): INR, PROTIME in the last 168 hours. Cardiac Enzymes: No results for input(s): CKTOTAL, CKMB, CKMBINDEX, TROPONINI in the last 168 hours. BNP (last 3 results) No results for input(s): PROBNP in the last 8760 hours. HbA1C: No results for input(s): HGBA1C in the last 72 hours. CBG: Recent Labs  Lab 12/08/23 1555 12/08/23 2103 12/09/23 0603 12/09/23 0728 12/09/23 1100  GLUCAP 168* 242* 199* 185* 147*   Lipid Profile: No results for input(s): CHOL, HDL, LDLCALC, TRIG, CHOLHDL, LDLDIRECT in the last 72 hours. Thyroid  Function Tests: Recent Labs    12/07/23 1000  TSH 2.423   Anemia Panel: No results for input(s): VITAMINB12, FOLATE, FERRITIN, TIBC, IRON, RETICCTPCT in the last 72 hours. Urine analysis:    Component Value Date/Time   COLORURINE YELLOW 12/07/2023 0839   APPEARANCEUR CLEAR 12/07/2023 0839   LABSPEC 1.022 12/07/2023 0839   PHURINE 5.0 12/07/2023 0839   GLUCOSEU >=500 (A) 12/07/2023 0839   HGBUR NEGATIVE 12/07/2023 0839   BILIRUBINUR NEGATIVE 12/07/2023 0839   KETONESUR NEGATIVE 12/07/2023 0839   PROTEINUR NEGATIVE 12/07/2023 0839   NITRITE NEGATIVE 12/07/2023 0839   LEUKOCYTESUR NEGATIVE 12/07/2023 0839   Sepsis Labs: @LABRCNTIP (procalcitonin:4,lacticidven:4)  )No results found for this or any previous visit (from the past 240 hours).   Radiology Studies: US  Abdomen Limited RUQ (LIVER/GB) Result Date: 12/08/2023 CLINICAL DATA:  Elevated bili Rue EXAM: ULTRASOUND ABDOMEN LIMITED RIGHT UPPER QUADRANT COMPARISON:  None Available. FINDINGS: Gallbladder: Gallbladder is well distended. Gallstones are seen without complicating factors. Negative sonographic Murphy's sign is  noted. Common bile duct: Diameter: 3 mm Liver: No focal lesion identified. Within normal limits in parenchymal echogenicity. Portal vein is patent on color Doppler imaging with normal direction of blood flow towards the liver. Other: None. IMPRESSION: Cholelithiasis without complicating factors. Electronically Signed   By: Oneil Devonshire M.D.   On: 12/08/2023 23:59   ECHOCARDIOGRAM COMPLETE Result Date: 12/07/2023    ECHOCARDIOGRAM REPORT   Patient Name:   KHYLEN RIOLO Date of Exam: 12/07/2023 Medical Rec #:  [id number]       Height:       73.0 in Accession #:    [id number]      Weight:       175.0 lb Date of Birth:  01-14-1946        BSA:          2.033 m Patient Age:    77 years        BP:           116/74 mmHg Patient Gender: M               HR:  88 bpm. Exam Location:  Inpatient Procedure: 2D Echo, Cardiac Doppler and Color Doppler (Both Spectral and Color            Flow Doppler were utilized during procedure). Indications:    Congestive Heart Failure I50.9  History:        Patient has prior history of Echocardiogram examinations, most                 recent 06/10/2023. HFpEF, CAD, COPD; Risk Factors:Hypertension,                 Diabetes, Former Smoker and Dyslipidemia.  Sonographer:    Koleen Popper RDCS Referring Phys: [id number] RONDELL A SMITH  Sonographer Comments: Suboptimal apical window. Image acquisition challenging due to respiratory motion. IMPRESSIONS  1. Left ventricular ejection fraction, by estimation, is 60 to 65%. The left ventricle has normal function. The left ventricle has no regional wall motion abnormalities. There is mild left ventricular hypertrophy. Left ventricular diastolic parameters were normal. There is the interventricular septum is flattened in systole and diastole, consistent with right ventricular pressure and volume overload.  2. Right ventricular systolic function is normal. The right ventricular size is enlarged. Mildly increased right ventricular wall thickness.  There is moderately elevated pulmonary artery systolic pressure. The estimated right ventricular systolic pressure is 58.3 mmHg.  3. The mitral valve is degenerative. No evidence of mitral valve regurgitation. No evidence of mitral stenosis.  4. Tricuspid valve regurgitation is moderate.  5. The aortic valve is normal in structure. Aortic valve regurgitation is not visualized. No aortic stenosis is present.  6. The inferior vena cava is dilated in size with <50% respiratory variability, suggesting right atrial pressure of 15 mmHg. Comparison(s): A prior study was performed on 06/10/2023. Interventricular septum is flattened in systole and diastole, consistent with right ventricular pressure and volume overload and elevated RAP are new findings. FINDINGS  Left Ventricle: Left ventricular ejection fraction, by estimation, is 60 to 65%. The left ventricle has normal function. The left ventricle has no regional wall motion abnormalities. The left ventricular internal cavity size was normal in size. There is  mild left ventricular hypertrophy. The interventricular septum is flattened in systole and diastole, consistent with right ventricular pressure and volume overload. Left ventricular diastolic parameters were normal. Right Ventricle: The right ventricular size is enlarged. Mildly increased right ventricular wall thickness. Right ventricular systolic function is normal. There is moderately elevated pulmonary artery systolic pressure. The tricuspid regurgitant velocity  is 3.29 m/s, and with an assumed right atrial pressure of 15 mmHg, the estimated right ventricular systolic pressure is 58.3 mmHg. Left Atrium: Left atrial size was normal in size. Right Atrium: Right atrial size was normal in size. Pericardium: There is no evidence of pericardial effusion. Mitral Valve: The mitral valve is degenerative in appearance. There is moderate thickening of the mitral valve leaflet(s). Normal mobility of the mitral valve  leaflets. No evidence of mitral valve regurgitation. No evidence of mitral valve stenosis. Tricuspid Valve: The tricuspid valve is normal in structure. Tricuspid valve regurgitation is moderate . No evidence of tricuspid stenosis. Aortic Valve: The aortic valve is normal in structure. Aortic valve regurgitation is not visualized. No aortic stenosis is present. Pulmonic Valve: The pulmonic valve was normal in structure. Pulmonic valve regurgitation is mild. No evidence of pulmonic stenosis. Aorta: The aortic root and ascending aorta are structurally normal, with no evidence of dilitation. Venous: The inferior vena cava is dilated in size with less than 50% respiratory  variability, suggesting right atrial pressure of 15 mmHg. IAS/Shunts: The atrial septum is grossly normal. Additional Comments: A device lead is visualized.  LEFT VENTRICLE PLAX 2D LVIDd:         4.40 cm   Diastology LVIDs:         3.20 cm   LV e' medial:    7.18 cm/s LV PW:         1.10 cm   LV E/e' medial:  5.1 LV IVS:        1.40 cm   LV e' lateral:   8.92 cm/s LVOT diam:     1.90 cm   LV E/e' lateral: 4.1 LV SV:         40 LV SV Index:   20 LVOT Area:     2.84 cm  RIGHT VENTRICLE             IVC RV Basal diam:  5.70 cm     IVC diam: 2.70 cm RV Mid diam:    5.40 cm RV S prime:     11.60 cm/s TAPSE (M-mode): 2.2 cm LEFT ATRIUM         Index       RIGHT ATRIUM           Index LA diam:    2.10 cm 1.03 cm/m  RA Area:     20.80 cm                                 RA Volume:   64.90 ml  31.92 ml/m  AORTIC VALVE LVOT Vmax:   93.20 cm/s LVOT Vmean:  59.900 cm/s LVOT VTI:    0.140 m  AORTA Ao Root diam: 2.80 cm Ao Asc diam:  3.30 cm MITRAL VALVE               TRICUSPID VALVE MV Area (PHT): 3.72 cm    TR Peak grad:   43.3 mmHg MV Decel Time: 204 msec    TR Vmax:        329.00 cm/s MV E velocity: 36.50 cm/s MV A velocity: 88.30 cm/s  SHUNTS MV E/A ratio:  0.41        Systemic VTI:  0.14 m                            Systemic Diam: 1.90 cm Sunit Tolia  Electronically signed by Madonna Large Signature Date/Time: 12/07/2023/6:19:16 PM    Final      Scheduled Meds:  aspirin  EC  81 mg Oral Daily   atorvastatin   40 mg Oral Daily   enoxaparin  (LOVENOX ) injection  40 mg Subcutaneous Q24H   ezetimibe   10 mg Oral Daily   feeding supplement  237 mL Oral BID BM   furosemide   80 mg Intravenous BID   insulin  aspart  0-9 Units Subcutaneous TID WC   levothyroxine   50 mcg Oral Q0600   midodrine   10 mg Oral TID WC   sodium chloride  flush  3 mL Intravenous Q12H   Continuous Infusions:  sodium chloride  10 mL/hr at 12/09/23 0527   cefTRIAXone  (ROCEPHIN )  IV     linezolid  (ZYVOX ) IV       LOS: 2 days    Time spent:    Sigurd Pac, MD [facility]   12/09/2023, 11:10 AM

## 2023-12-09 NOTE — Progress Notes (Signed)
 Pt transported on BiPAP from Cath Lab to 2M04 w/o any complications.

## 2023-12-09 NOTE — TOC CM/SW Note (Signed)
 Transition of Care [facility]) - Inpatient Brief Assessment   Patient Details  Name: Shaheim Mahar MRN: [id number] Date of Birth: 22-Nov-1945  [facility]) CM/SW Contact:    Lauraine FORBES Saa, LCSW Phone Number: 12/09/2023, 1:57 PM   Clinical Narrative:  1:57 PM Per chart review, patient resides at home with spouse and child(ren). Patient has a PCP and insurance. Patient does not have SNF history. Patient has HH history with [facility]. Patient has DME (BiPap, 3 in 1, home oxygen, tub bench) history with [facility] and [employer]. Patient's preferred pharmacy's are [facility], [facility], [facility] [address]. Physical therapy evaluated patient and recommended them discharging home with [facility]. TOC will continue to follow and be available to assist.  Transition of Care Asessment: Insurance and Status: Insurance coverage has been reviewed Patient has primary care physician: Yes Home environment has been reviewed: Private Residence Prior level of function:: N/A Prior/Current Home Services: No current home services (Has HH/DME history) Social Drivers of Health Review: SDOH reviewed no interventions necessary Readmission risk has been reviewed: Yes Transition of care needs: transition of care needs identified, TOC will continue to follow

## 2023-12-09 NOTE — Progress Notes (Signed)
 Patient w/ episode of lethargy. VSS, unable to answer orientation questions or follow commands at times. Fluctuation in alertness, responses after sternal rub.  Paged MYRTIS Pac, MD, STAT ABG ordered. Contacted respiratory to draw.   Per Dr. Pac, hold off on [facility] until after ABG results.

## 2023-12-10 ENCOUNTER — Inpatient Hospital Stay (HOSPITAL_COMMUNITY)

## 2023-12-10 ENCOUNTER — Encounter (HOSPITAL_COMMUNITY): Payer: Self-pay | Admitting: Internal Medicine

## 2023-12-10 DIAGNOSIS — N1831 Chronic kidney disease, stage 3a: Secondary | ICD-10-CM | POA: Diagnosis not present

## 2023-12-10 DIAGNOSIS — M7989 Other specified soft tissue disorders: Secondary | ICD-10-CM | POA: Diagnosis not present

## 2023-12-10 DIAGNOSIS — R570 Cardiogenic shock: Secondary | ICD-10-CM | POA: Diagnosis not present

## 2023-12-10 DIAGNOSIS — Z7189 Other specified counseling: Secondary | ICD-10-CM | POA: Diagnosis not present

## 2023-12-10 DIAGNOSIS — I50813 Acute on chronic right heart failure: Secondary | ICD-10-CM | POA: Diagnosis not present

## 2023-12-10 DIAGNOSIS — N179 Acute kidney failure, unspecified: Secondary | ICD-10-CM | POA: Diagnosis not present

## 2023-12-10 DIAGNOSIS — I272 Pulmonary hypertension, unspecified: Secondary | ICD-10-CM | POA: Diagnosis not present

## 2023-12-10 DIAGNOSIS — J9622 Acute and chronic respiratory failure with hypercapnia: Secondary | ICD-10-CM | POA: Diagnosis not present

## 2023-12-10 DIAGNOSIS — I50810 Right heart failure, unspecified: Secondary | ICD-10-CM | POA: Diagnosis not present

## 2023-12-10 DIAGNOSIS — I5033 Acute on chronic diastolic (congestive) heart failure: Secondary | ICD-10-CM | POA: Diagnosis not present

## 2023-12-10 LAB — COOXEMETRY PANEL
Carboxyhemoglobin: 2.3 % — ABNORMAL HIGH (ref 0.5–1.5)
Carboxyhemoglobin: 2 % — ABNORMAL HIGH (ref 0.5–1.5)
Methemoglobin: 0.7 % (ref 0.0–1.5)
Methemoglobin: 0.7 % (ref 0.0–1.5)
O2 Saturation: 97 %
O2 Saturation: 84.1 %
Total hemoglobin: 14.9 g/dL (ref 12.0–16.0)
Total hemoglobin: 14.5 g/dL (ref 12.0–16.0)

## 2023-12-10 LAB — COMPREHENSIVE METABOLIC PANEL WITH GFR
ALT: 12 U/L (ref 0–44)
AST: 22 U/L (ref 15–41)
Albumin: 2.3 g/dL — ABNORMAL LOW (ref 3.5–5.0)
Alkaline Phosphatase: 114 U/L (ref 38–126)
Anion gap: 12 (ref 5–15)
BUN: 67 mg/dL — ABNORMAL HIGH (ref 8–23)
CO2: 29 mmol/L (ref 22–32)
Calcium: 9.5 mg/dL (ref 8.9–10.3)
Chloride: 92 mmol/L — ABNORMAL LOW (ref 98–111)
Creatinine, Ser: 2.48 mg/dL — ABNORMAL HIGH (ref 0.61–1.24)
GFR, Estimated: 26 mL/min — ABNORMAL LOW (ref >60)
Glucose, Bld: 196 mg/dL — ABNORMAL HIGH (ref 70–99)
Potassium: 3.6 mmol/L (ref 3.5–5.1)
Sodium: 133 mmol/L — ABNORMAL LOW (ref 135–145)
Total Bilirubin: 1 mg/dL (ref 0.0–1.2)
Total Protein: 5.7 g/dL — ABNORMAL LOW (ref 6.5–8.1)

## 2023-12-10 LAB — POCT I-STAT 7, (LYTES, BLD GAS, ICA,H+H)
Acid-Base Excess: 6 mmol/L — ABNORMAL HIGH (ref 0.0–2.0)
Bicarbonate: 33.9 mmol/L — ABNORMAL HIGH (ref 20.0–28.0)
Calcium, Ion: 1.29 mmol/L (ref 1.15–1.40)
HCT: 44 % (ref 39.0–52.0)
Hemoglobin: 15 g/dL (ref 13.0–17.0)
O2 Saturation: 90 %
Patient temperature: 98.6
Potassium: 3.8 mmol/L (ref 3.5–5.1)
Sodium: 132 mmol/L — ABNORMAL LOW (ref 135–145)
TCO2: 36 mmol/L — ABNORMAL HIGH (ref 22–32)
pCO2 arterial: 60.5 mmHg — ABNORMAL HIGH (ref 32–48)
pH, Arterial: 7.356 (ref 7.35–7.45)
pO2, Arterial: 63 mmHg — ABNORMAL LOW (ref 83–108)

## 2023-12-10 LAB — GLUCOSE, CAPILLARY
Glucose-Capillary: 187 mg/dL — ABNORMAL HIGH (ref 70–99)
Glucose-Capillary: 168 mg/dL — ABNORMAL HIGH (ref 70–99)
Glucose-Capillary: 181 mg/dL — ABNORMAL HIGH (ref 70–99)
Glucose-Capillary: 227 mg/dL — ABNORMAL HIGH (ref 70–99)
Glucose-Capillary: 199 mg/dL — ABNORMAL HIGH (ref 70–99)

## 2023-12-10 LAB — CBC
HCT: 45.4 % (ref 39.0–52.0)
Hemoglobin: 14.4 g/dL (ref 13.0–17.0)
MCH: 29.4 pg (ref 26.0–34.0)
MCHC: 31.7 g/dL (ref 30.0–36.0)
MCV: 92.7 fL (ref 80.0–100.0)
Platelets: 221 K/uL (ref 150–400)
RBC: 4.9 MIL/uL (ref 4.22–5.81)
RDW: 13.8 % (ref 11.5–15.5)
WBC: 15.8 K/uL — ABNORMAL HIGH (ref 4.0–10.5)
nRBC: 0 % (ref 0.0–0.2)

## 2023-12-10 LAB — MAGNESIUM: Magnesium: 1.8 mg/dL (ref 1.7–2.4)

## 2023-12-10 MED ORDER — NOREPINEPHRINE 16 MG/250ML-% IV SOLN
0.0000 ug/min | INTRAVENOUS | Status: DC
  Administered 2023-12-10: 19 ug/min via INTRAVENOUS
  Administered 2023-12-11: 2 ug/min via INTRAVENOUS
  Filled 2023-12-10 (×2): qty 250

## 2023-12-10 MED ORDER — OLANZAPINE 10 MG IM SOLR
5.0000 mg | Freq: Four times a day (QID) | INTRAMUSCULAR | Status: DC | PRN
  Administered 2023-12-10: 5 mg via INTRAVENOUS
  Filled 2023-12-10 (×2): qty 10

## 2023-12-10 MED ORDER — FUROSEMIDE 10 MG/ML IJ SOLN
80.0000 mg | Freq: Once | INTRAMUSCULAR | Status: AC
  Administered 2023-12-10: 80 mg via INTRAVENOUS
  Filled 2023-12-10: qty 8

## 2023-12-10 MED ORDER — ORAL CARE MOUTH RINSE
15.0000 mL | OROMUCOSAL | Status: DC | PRN
Start: 2023-12-10 — End: 2023-12-19

## 2023-12-10 MED ORDER — MAGNESIUM SULFATE 2 GM/50ML IV SOLN
2.0000 g | Freq: Once | INTRAVENOUS | Status: AC
  Administered 2023-12-10: 2 g via INTRAVENOUS
  Filled 2023-12-10: qty 50

## 2023-12-10 MED ORDER — POTASSIUM CHLORIDE 10 MEQ/50ML IV SOLN
10.0000 meq | INTRAVENOUS | Status: AC
  Administered 2023-12-10 (×4): 10 meq via INTRAVENOUS
  Filled 2023-12-10 (×4): qty 50

## 2023-12-10 MED ORDER — ORAL CARE MOUTH RINSE
15.0000 mL | OROMUCOSAL | Status: DC
  Administered 2023-12-10 – 2023-12-19 (×36): 15 mL via OROMUCOSAL

## 2023-12-10 MED ORDER — STERILE WATER FOR INJECTION IJ SOLN
INTRAMUSCULAR | Status: AC
  Administered 2023-12-11: 2.1 mL
  Filled 2023-12-10: qty 10

## 2023-12-10 NOTE — TOC Initial Note (Signed)
 Transition of Care [facility]) - Initial/Assessment Note    Patient Details  Name: Robert Ramirez MRN: [id number] Date of Birth: 04-25-1946  Transition of Care [facility]) CM/SW Contact:    Lauraine FORBES Saa, LCSW Phone Number: 12/10/2023, 2:06 PM  Clinical Narrative:                  2:06 PM CSW introduced self and role to patient's spouse, Santa (patient is not currently fully oriented). CSW informed Santa of physical therapy's recommendation of patient discharging to SNF. Santa was agreeable with recommendation and consented CSW to send referrals to SNFs within [facility].  Expected Discharge Plan: Skilled Nursing Facility Barriers to Discharge: Continued Medical Work up, English as a second language teacher, SNF Pending bed offer   Patient Goals and CMS Choice            Expected Discharge Plan and Services In-house Referral: Clinical Social Work   Post Acute Care Choice: Skilled Nursing Facility Living arrangements for the past 2 months: Single Family Home                                      Prior Living Arrangements/Services Living arrangements for the past 2 months: Single Family Home Lives with:: Spouse, Adult Children Patient language and need for interpreter reviewed:: Yes        Need for Family Participation in Patient Care: Yes (Comment)     Criminal Activity/Legal Involvement Pertinent to Current Situation/Hospitalization: No - Comment as needed  Activities of Daily Living   ADL Screening (condition at time of admission) Independently performs ADLs?: Yes (appropriate for developmental age) Is the patient deaf or have difficulty hearing?: No Does the patient have difficulty seeing, even when wearing glasses/contacts?: No Does the patient have difficulty concentrating, remembering, or making decisions?: No  Permission Sought/Granted Permission sought to share information with : Family Supports, Oceanographer granted to share  information with : No (Contact information on chart)  Share Information with NAME: Kartier Bennison  Permission granted to share info w AGENCY: SNF  Permission granted to share info w Relationship: Spouse  Permission granted to share info w Contact Information: [phone]  Emotional Assessment Appearance:: Appears stated age Attitude/Demeanor/Rapport: Unable to Assess Affect (typically observed): Unable to Assess Orientation: : Oriented to Self Alcohol / Substance Use: Not Applicable Psych Involvement: No (comment)  Admission diagnosis:  Weakness [R53.1] Renal insufficiency [N28.9] (HFpEF) heart failure with preserved ejection fraction (HCC) [I50.30] Patient Active Problem List   Diagnosis Date Noted   (HFpEF) heart failure with preserved ejection fraction (HCC) 12/07/2023   Cellulitis 12/07/2023   Elevated troponin 12/07/2023   SIRS (systemic inflammatory response syndrome) (HCC) 12/07/2023   Uncontrolled type 2 diabetes mellitus with hyperglycemia, with long-term current use of insulin  (HCC) 12/07/2023   Acute kidney injury superimposed on chronic kidney disease (HCC) 12/07/2023   History of permanent cardiac pacemaker placement 12/07/2023   OSA treated with BiPAP 12/07/2023   Acute on chronic right-sided heart failure (HCC) 12/07/2023   AV block 06/12/2023   Complete heart block (HCC) 05/28/2023   Poorly-controlled hypertension 04/05/2019   Conductive hearing loss 04/05/2019   Chewing tobacco use 04/05/2019   Bilateral impacted cerumen 04/05/2019   Primary hyperparathyroidism (HCC) 11/21/2018   Chronic respiratory failure with hypoxia and hypercapnia (HCC) 11/18/2018   Chronic obstructive asthma (with obstructive pulmonary disease) (HCC) 06/09/2018   Other secondary pulmonary hypertension (HCC) 06/09/2018  Healthcare maintenance 03/30/2018   Serum total bilirubin elevated 03/26/2018   Asbestos exposure 03/26/2018   Lesion of skin of scalp 03/26/2018   Coronary artery  disease involving native coronary artery of native heart without angina pectoris 03/26/2018   Acquired hypothyroidism 03/26/2018   Pleural plaque 03/14/2018   Diabetes (HCC) 03/14/2018   Somnolence    Hyponatremia    Abnormal chest x-ray    Hypertension associated with diabetes (HCC) 07/18/2014   Mixed hyperlipidemia 07/18/2014   Diabetic retinopathy associated with type 2 diabetes mellitus (HCC) 07/18/2014   PCP:  Lorren Greig PARAS, NP Pharmacy:   [facility] [id number]), [place] - [address] [place] ([place]) [place] [address] Phone: [phone] Fax: [phone]  [facility] - [address], [place] - [address] [place] [address] Phone: [phone] Fax: [phone]  [place] [facility] [address] [place] [address] Phone: [phone] Fax: [phone]     Social Drivers of Health (SDOH) Social History: SDOH Screenings   Food Insecurity: No Food Insecurity (12/08/2023)  Housing: Low Risk  (12/08/2023)  Transportation Needs: No Transportation Needs (12/08/2023)  Utilities: Not At Risk (12/08/2023)  Alcohol Screen: Low Risk  (08/06/2023)  Depression (PHQ2-9): Low Risk  (09/30/2023)  Financial Resource Strain: Low Risk  (08/06/2023)  Physical Activity: Sufficiently Active (08/06/2023)  Social Connections: Moderately Isolated (12/08/2023)  Stress: No Stress Concern Present (08/06/2023)  Tobacco Use: High Risk (12/07/2023)  Health Literacy: Adequate Health Literacy (08/06/2023)   SDOH Interventions:     Readmission Risk Interventions     No data to display

## 2023-12-10 NOTE — Progress Notes (Addendum)
 NAME:  Robert Ramirez, MRN:  [id number], DOB:  Apr 03, 1946, LOS: 3 ADMISSION DATE:  12/07/2023, CONSULTATION DATE:  7/16 REFERRING MD:  Dr. Cherrie, AHF CHIEF COMPLAINT:  RV failure, respiratory failure   History of Present Illness:  78 year old male with past medical history of diabetes, hypercholesterolemia, hypertension, CAD, HFpEF with RV failure, complete heart block s/p dual chamber PPM 05/2023, OSA and chronic respiratory failure on BiPAP at home, asbestos exposure with pleural plaque without ILD who presented to the emergency department on 12/07/23 with complaint of weakness for 2 weeks. Noted increased LE edema, EMS noting O2 sat ~92% requiring supplemental O2. In ED, afebrile, soft BP ~90s systolic, BNP 1195, WBC 18, sCr 1.8. Given Ancef  for concern of lower extremity cellulitis and was admitted to hospitalist. TRH started patient on BID Lasix  and repeated his echo demonstrating LVEF 60-65%, RV enlarged, estimated RVSP of 58. 7/15 early AM had soft BP. TRH gave patient 250cc LR bolus. Improved pressure which drifted back down so given additional 500cc bolus then 250 again for total of 1L IVF. 7/15 morning, AHF was consulted with worsened RV failure from previous admission, and recommended RHC, started on midodrine . 7/16, drowsy and disoriented, SOB and sCr increased with diuresis. Went for RHC and had ABG performed showing pCO2 >100. CCM was consulted to cath lab and patient was placed on BiPAP. AHF requesting ICU bed and CCM consult.   Pertinent  Medical History  History of diabetes, hypercholesterolemia, hypertension, CAD, HFpEF with RV failure, complete heart block s/p dual chamber PPM 05/2023, OSA and chronic respiratory failure on BiPAP at home, asbestos exposure with pleural plaque without ILD   Significant Hospital Events: Including procedures, antibiotic start and stop dates in addition to other pertinent events   7/14: admit for heart failure, started on antibiotics for cellulitis  coverage; echo with worsened RV failure  7/15: soft BP, given 1L IVF with initial improvement. Started on midodrine  and AHF consult  7/16: drowsy, disoriented, pCO2 high in cath lab, started on BiPAP. AHF request for CCM involvement and transfer to ICU.   Interim History / Subjective:  Arrived ICU yesterday. Overnight on BiPap. Off this morning now [place]. Reports only a sore throat but fixates on this. Answers all questions with an okay/yeah or with a reference to his sore throat.   Objective    Blood pressure (!) 114/59, pulse (!) 113, temperature 98.6 F (37 C), temperature source Oral, resp. rate (!) 37, height 6' 1 (1.854 m), weight 85.1 kg, SpO2 99%.    Vent Mode: PCV;BIPAP FiO2 (%):  [36 %-100 %] 50 % Set Rate:  [12 bmp] 12 bmp Pressure Support:  [6 cmH20] 6 cmH20   Intake/Output Summary (Last 24 hours) at 12/10/2023 1029 Last data filed at 12/10/2023 0600 Gross per 24 hour  Intake 1439.07 ml  Output 2375 ml  Net -935.93 ml   Filed Weights   12/09/23 0440 12/09/23 1414 12/10/23 0500  Weight: 85.8 kg 88.8 kg 85.1 kg   Examination: General: older male, acute on chronically ill appearing  HENT: perrla, ncat, large skin lesion to the right posterior scalp and neck, [place]  Lungs: diminished bilaterally, Index  Cardiovascular: s1s2, cannot appreciate murmur  Abdomen: soft, hypoactive bowel sounds  Extremities: bilateral lower extremity 1+ pitting edema, BLE cellulitis red and warm  Neuro: Aox0, intermittent drowsiness, nonfocal GU: external UC  Resolved problem list   Assessment and Plan   Acute on chronic hypoxic, hypercapneic respiratory failure  Acute Encephalopathy from hypercarbia, high  risk of delirium - disoriented but no longer lethargic  Hx of OSA on bedtime BiPAP at home and O2 prn Chronic pleural plaque c/w prior granulomatous disease Pulmonary hypertension, type 1 vs 3 Inciting event is unclear. Context is R heart failure and pulmonary hypertension. On Bipap most  of night and now [place] this am. Now in in no distress. Remains encephalopathic, Aox0, fixates on a sore throat, not answering questions appropriately. Will collect ABG to see if improvement in hypercarbia. - wean O2 as able - may resume Bipap pending acid/base status   Shock - Cardiogenic Decompensated HFpEF  RV failure  CHB s/p dual chamber PPM 05/2023 AHF is following, thank you. R heart weak with underlying pulmonary HTN. Could consider septic element of shock with cellulitis but no fever, stable WBCs.   - On ABX - repeat Coox pending - Milrinone  for heart support, but contributing hypotension - goal MAP > 65 - Appreciate AHF recs  - midodrine  10mg  TID  - One more dose lasix  today, then plan is to hold - trend wbc, lactic, fever curve     Acute kidney injury on CKD3 ?cardiorenal syndrome  No UTI on admission. Consider cardiorenal in setting of decompensated right heart failure, although trending worse with diuresis. - s/p diuresis, one more dose today - trend bmp, mag, phos - replete elytes - strict I&O   Lower extremity cellulitis  - linezolid , rocephin  - start with 5 days and extend as needed. Day 1 was 7/14 - trend wbc, fever, lactic curve     Diabetes  SSI   Skin lesion  Large skin lesion to right scalp and neck. Refused to see derm in 2019. 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 Likely end stage heart/lung disease.  Best Practice (right click and Reselect all SmartList Selections daily)   Diet/type: NPO DVT prophylaxis: LMWH GI prophylaxis: N/A Lines: Central line, Arterial Line, and yes and it is still needed Foley:  N/A Code Status:  full code Last date of multidisciplinary goals of care discussion [pending]  Labs   CBC: Recent Labs  Lab 12/07/23 0801 12/08/23 0401 12/09/23 0242 12/09/23 1255 12/10/23 0911  WBC 18.2* 16.0* 14.8*  --  15.8*  NEUTROABS 16.1*  --   12.8*  --   --   HGB 16.4 14.3 14.1 16.3 14.4  HCT 51.1 46.4 46.0 48.0 45.4  MCV 93.1 94.7 94.5  --  92.7  PLT 165 163 192  --  221   Basic Metabolic Panel: Recent Labs  Lab 12/07/23 0758 12/07/23 0801 12/08/23 0401 12/09/23 0242 12/09/23 1255 12/10/23 0020  NA 135 134* 138 134* 133* 133*  K 4.8 4.6 3.9 4.3 4.6 3.6  CL 93* 91* 96* 91*  --  92*  CO2  --  32 33* 33*  --  29  GLUCOSE 279* 279* 113* 221*  --  196*  BUN 37* 32* 39* 60*  --  67*  CREATININE 1.60* 1.80* 1.67* 2.33*  --  2.48*  CALCIUM   --  9.9 9.8 9.7  --  9.5  MG  --   --   --  2.0  --  1.8   GFR: Estimated Creatinine Clearance: 28.6 mL/min (A) (by C-G formula based on SCr of 2.48 mg/dL (H)). Recent Labs  Lab 12/07/23 0801 12/08/23 0401 12/08/23 9285 12/09/23 0242 12/09/23 1636 12/09/23 2004 12/10/23 9088  WBC 18.2* 16.0*  --  14.8*  --   --  15.8*  LATICACIDVEN  --   --  0.9  --  0.7 0.7  --    Liver Function Tests: Recent Labs  Lab 12/07/23 0801 12/10/23 0020  AST 28 22  ALT 16 12  ALKPHOS 106 114  BILITOT 2.5* 1.0  PROT 6.1* 5.7*  ALBUMIN 3.0* 2.3*   No results for input(s): LIPASE, AMYLASE in the last 168 hours. Recent Labs  Lab 12/09/23 0934  AMMONIA 27   ABG    Component Value Date/Time   PHART 7.23 (L) 12/09/2023 1406   PCO2ART 74 (HH) 12/09/2023 1406   PO2ART 238 (H) 12/09/2023 1406   HCO3 31.1 (H) 12/09/2023 1406   TCO2 37 (H) 12/09/2023 1255   O2SAT 97 12/10/2023 0852    Coagulation Profile: No results for input(s): INR, PROTIME in the last 168 hours.  Cardiac Enzymes: No results for input(s): CKTOTAL, CKMB, CKMBINDEX, TROPONINI in the last 168 hours.  HbA1C: Hemoglobin A1C  Date/Time Value Ref Range Status  09/30/2023 11:18 AM 8.0 (A) 4.0 - 5.6 % Final  08/04/2022 02:50 PM 7.6 (A) 4.0 - 5.6 % Final   HbA1c, POC (controlled diabetic range)  Date/Time Value Ref Range Status  03/15/2021 10:01 AM 8.3 (A) 0.0 - 7.0 % Final   Hgb A1c MFr Bld  Date/Time  Value Ref Range Status  06/12/2023 06:14 PM 7.5 (H) 4.8 - 5.6 % Final    Comment:    (NOTE) Pre diabetes:          5.7%-6.4%  Diabetes:              >6.4%  Glycemic control for   <7.0% adults with diabetes   01/28/2022 02:00 PM 8.2 (H) 4.8 - 5.6 % Final    Comment:             Prediabetes: 5.7 - 6.4          Diabetes: >6.4          Glycemic control for adults with diabetes: <7.0    CBG: Recent Labs  Lab 12/09/23 1635 12/09/23 1952 12/09/23 2300 12/10/23 0321 12/10/23 0722  GLUCAP 152* 161* 213* 187* 168*   Review of Systems:   Complains of a sore throat. Fixates on this and reports no other concerns and answers no other questions.  Past Medical History:  He,  has a past medical history of Diabetes mellitus, High cholesterol, and Hypertension.   Surgical History:   Past Surgical History:  Procedure Laterality Date   APPENDECTOMY     ARTERIAL LINE INSERTION N/A 12/09/2023   Procedure: ARTERIAL LINE INSERTION;  Surgeon: Cherrie Toribio SAUNDERS, MD;  Location: [facility] INVASIVE CV LAB;  Service: Cardiovascular;  Laterality: N/A;   CENTRAL LINE INSERTION  12/09/2023   Procedure: CENTRAL LINE INSERTION;  Surgeon: Cherrie Toribio SAUNDERS, MD;  Location: [facility] INVASIVE CV LAB;  Service: Cardiovascular;;   PACEMAKER IMPLANT N/A 06/12/2023   Procedure: PACEMAKER IMPLANT;  Surgeon: Kennyth Chew, MD;  [facility] IN[facility];  Service: Cardiovascular;  Laterality: N/A;   RIGHT HEART CATH N/A 12/09/2023   Procedure: RIGHT HEART CATH;  Surgeon: Cherrie Toribio SAUNDERS, MD;  Location: [facility] INVASIVE CV LAB;  Service: Cardiovascular;  Laterality: N/A;   RIGHT/LEFT HEART CATH AND CORONARY ANGIOGRAPHY N/A 03/17/2018   Procedure: RIGHT/LEFT HEART CATH AND CORONARY ANGIOGRAPHY;  Surgeon: Claudene Pacific, MD;  Location: [facility] INVASIVE CV LAB;  Service: Cardiovascular;  Laterality: N/A;  Social History:   reports that he quit smoking about 43 years ago. His smoking use included cigarettes. He has never been  exposed to tobacco smoke. His smokeless tobacco use includes chew and snuff. He reports that he does not drink alcohol and does not use drugs.   Family History:  His family history includes Cancer in his father and sister; Diabetes in his sister; Heart failure in his mother.   Allergies No Known Allergies   Home Medications  Prior to Admission medications   Medication Sig Start Date End Date Taking? Authorizing Provider  amLODipine  (NORVASC ) 10 MG tablet Take 1 tablet (10 mg total) by mouth daily. 08/04/22  Yes Lorren, Amy J, NP  aspirin  EC 81 MG tablet Take 81 mg by mouth daily. Swallow whole.   Yes [provider]  atorvastatin  (LIPITOR) 40 MG tablet Take 40 mg by mouth daily.   Yes [provider]  chlorthalidone  (HYGROTON ) 25 MG tablet TAKE 1 TABLET EVERY DAY 10/16/22  Yes Thukkani, Arun K, MD  Cholecalciferol (VITAMIN D-3) 25 MCG (1000 UT) CAPS Take 1 capsule by mouth every morning.   Yes [provider]  empagliflozin  (JARDIANCE ) 10 MG TABS tablet Take 1 tablet (10 mg total) by mouth daily before breakfast. 05/07/23  Yes Thukkani, Arun K, MD  ezetimibe  (ZETIA ) 10 MG tablet Take 1 tablet (10 mg total) by mouth daily. 11/05/23  Yes Thukkani, Arun K, MD  Insulin  NPH, Human,, Isophane, (HUMULIN  N KWIKPEN) 100 UNIT/ML Kiwkpen Inject 33 Units into the skin in the morning and at bedtime. 10/02/23  Yes Newlin, Enobong, MD  levothyroxine  (SYNTHROID ) 50 MCG tablet Take 1 tablet (50 mcg total) by mouth daily. 09/30/23 12/29/23 Yes Lorren, Amy J, NP  lisinopril  (ZESTRIL ) 40 MG tablet TAKE 1 TABLET EVERY DAY 10/16/22  Yes Thukkani, Arun K, MD  metFORMIN  (GLUCOPHAGE ) 1000 MG tablet Take 1 tablet (1,000 mg total) by mouth 2 (two) times daily with a meal. 09/30/23 12/29/23 Yes Lorren, Amy J, NP  nitroGLYCERIN  (NITROSTAT ) 0.4 MG SL tablet Place 1 tablet (0.4 mg total) under the tongue every 5 (five) minutes as needed for chest pain. 03/29/21  Yes Thukkani, Arun K, MD  spironolactone   (ALDACTONE ) 25 MG tablet TAKE 1 TABLET EVERY DAY Patient taking differently: Take 12.5 mg by mouth daily. 03/12/23  Yes Thukkani, Arun K, MD  vitamin B-12 (CYANOCOBALAMIN ) 500 MCG tablet Take 500 mcg by mouth daily.   Yes [provider]  blood glucose meter kit and supplies Dispense based on patient and insurance preference. Use up to four times daily as directed. (FOR ICD-10 E10.9, E11.9). 02/08/22   Lorren Greig PARAS, NP  Insulin  Pen Needle (DROPLET PEN NEEDLES) 31G X 8 MM MISC 1 each by Other route 2 (two) times daily with a meal. 08/04/22   Lorren Greig PARAS, NP    Critical care time: 35 mins    Lonni Africa, DO IM Resident PGY-2

## 2023-12-10 NOTE — Progress Notes (Signed)
 Physical Therapy Treatment Patient Details Name: Robert Ramirez MRN: [id number] DOB: 03-09-1946 Today's Date: 12/10/2023   History of Present Illness Pt is a 78 y.o. M presenting to [facility] on 12/07/23 w/ leg weakness and swelling. PMH is significant for HTN, HLD, HFpEF, heart block s/p PPM, and DMT2.    PT Comments  Pt admitted with above diagnosis. Pt was able to sit EOB for up to 10 min with CGA to min assist due to restlessness. Pt attempting to stand but couldn't get up and also couldn't scoot. Cognition impaired and pt confused today per nurse. Did not communicate with PT at all.  Will follow acutely. May need post acute rehab < 3 hours day.  Updated this in plan.  Pt currently with functional limitations due to the deficits listed below (see PT Problem List). Pt will benefit from acute skilled PT to increase their independence and safety with mobility to allow discharge.       If plan is discharge home, recommend the following: Assistance with cooking/housework;Assist for transportation;A lot of help with walking and/or transfers;A lot of help with bathing/dressing/bathroom;Help with stairs or ramp for entrance   Can travel by private vehicle     No  Equipment Recommendations  Rolling walker (2 wheels)    Recommendations for Other Services       Precautions / Restrictions Precautions Precautions: Fall Recall of Precautions/Restrictions: Intact Restrictions Weight Bearing Restrictions Per Provider Order: No     Mobility  Bed Mobility Overal bed mobility: Needs Assistance Bed Mobility: Supine to Sit     Supine to sit: HOB elevated, Used rails, Mod assist     General bed mobility comments: increased time to complete, incr assist as pt disoriented today    Transfers                   General transfer comment: unable due to confusion    Ambulation/Gait                   Stairs             Wheelchair Mobility     Tilt Bed    Modified Rankin  (Stroke Patients Only)       Balance Overall balance assessment: Needs assistance Sitting-balance support: Feet supported, No upper extremity supported, Bilateral upper extremity supported Sitting balance-Leahy Scale: Fair Sitting balance - Comments: seated EOB for 10 min. Pt attempts to stand but could not rise.  Pt not following commands and constant fidgeting sitting EOB.  Attempt to have pt scoot left and right and pt unable to follow commands.                                    Communication Communication Communication: No apparent difficulties  Cognition Arousal: Alert Behavior During Therapy: WFL for tasks assessed/performed   PT - Cognitive impairments: No apparent impairments                         Following commands: Intact      Cueing Cueing Techniques: Verbal cues, Visual cues  Exercises General Exercises - Lower Extremity Long Arc Quad: AROM, Both, 10 reps, Seated    General Comments General comments (skin integrity, edema, etc.): VSS      Pertinent Vitals/Pain Pain Assessment Pain Assessment: No/denies pain    Home Living  Prior Function            PT Goals (current goals can now be found in the care plan section) Acute Rehab PT Goals Patient Stated Goal: to feel better Progress towards PT goals: Not progressing toward goals - comment (confusion limiting pt)    Frequency    Min 2X/week      PT Plan      Co-evaluation              AM-PAC PT 6 Clicks Mobility   Outcome Measure  Help needed turning from your back to your side while in a flat bed without using bedrails?: A Little Help needed moving from lying on your back to sitting on the side of a flat bed without using bedrails?: A Little Help needed moving to and from a bed to a chair (including a wheelchair)?: Total Help needed standing up from a chair using your arms (e.g., wheelchair or bedside chair)?: Total Help  needed to walk in hospital room?: Total Help needed climbing 3-5 steps with a railing? : Total 6 Click Score: 10    End of Session Equipment Utilized During Treatment: Gait belt;Oxygen Activity Tolerance: Patient limited by fatigue (limited by confusion) Patient left: with call bell/phone within reach;in bed;with bed alarm set;with restraints reapplied Nurse Communication: Mobility status;Need for lift equipment PT Visit Diagnosis: Unsteadiness on feet (R26.81);Other abnormalities of gait and mobility (R26.89);Muscle weakness (generalized) (M62.81);Pain Pain - Right/Left: Right Pain - part of body: Leg     Time: 8865-8846 PT Time Calculation (min) (ACUTE ONLY): 19 min  Charges:    $Therapeutic Activity: 8-22 mins PT General Charges $$ ACUTE PT VISIT: 1 Visit                     [name] M,PT Acute Rehab Services [phone]    [name] 12/10/2023, 12:39 PM

## 2023-12-10 NOTE — NC FL2 (Signed)
 [place]  MEDICAID FL2 LEVEL OF CARE FORM     IDENTIFICATION  Patient Name: Robert Ramirez Birthdate: 16-Mar-1946 Sex: male Admission Date (Current Location): 12/07/2023  [facility] and [place] Number:  Producer, television/film/video and Address:  [facility], [address] [place], [place] [address]      Provider Number: [id number]  Attending Physician Name and Address:  Annella Donnice SAUNDERS, MD  Relative Name and Phone Number:  Robt Okuda; Spouse; [phone]    Current Level of Care: Hospital Recommended Level of Care: Skilled Nursing Facility Prior Approval Number:    Date Approved/Denied:   PASRR Number: [id number]  Discharge Plan: SNF    Current Diagnoses: Patient Active Problem List   Diagnosis Date Noted   (HFpEF) heart failure with preserved ejection fraction (HCC) 12/07/2023   Cellulitis 12/07/2023   Elevated troponin 12/07/2023   SIRS (systemic inflammatory response syndrome) (HCC) 12/07/2023   Uncontrolled type 2 diabetes mellitus with hyperglycemia, with long-term current use of insulin  (HCC) 12/07/2023   Acute kidney injury superimposed on chronic kidney disease (HCC) 12/07/2023   History of permanent cardiac pacemaker placement 12/07/2023   OSA treated with BiPAP 12/07/2023   Acute on chronic right-sided heart failure (HCC) 12/07/2023   AV block 06/12/2023   Complete heart block (HCC) 05/28/2023   Poorly-controlled hypertension 04/05/2019   Conductive hearing loss 04/05/2019   Chewing tobacco use 04/05/2019   Bilateral impacted cerumen 04/05/2019   Primary hyperparathyroidism (HCC) 11/21/2018   Chronic respiratory failure with hypoxia and hypercapnia (HCC) 11/18/2018   Chronic obstructive asthma (with obstructive pulmonary disease) (HCC) 06/09/2018   Other secondary pulmonary hypertension (HCC) 06/09/2018   Healthcare maintenance 03/30/2018   Serum total bilirubin elevated 03/26/2018   Asbestos exposure 03/26/2018   Lesion of  skin of scalp 03/26/2018   Coronary artery disease involving native coronary artery of native heart without angina pectoris 03/26/2018   Acquired hypothyroidism 03/26/2018   Pleural plaque 03/14/2018   Diabetes (HCC) 03/14/2018   Somnolence    Hyponatremia    Abnormal chest x-ray    Hypertension associated with diabetes (HCC) 07/18/2014   Mixed hyperlipidemia 07/18/2014   Diabetic retinopathy associated with type 2 diabetes mellitus (HCC) 07/18/2014    Orientation RESPIRATION BLADDER Height & Weight     Self  O2 (4L nasal cannula) External catheter, Incontinent Weight: 187 lb 9.8 oz (85.1 kg) Height:  6' 1 (185.4 cm)  BEHAVIORAL SYMPTOMS/MOOD NEUROLOGICAL BOWEL NUTRITION STATUS      Incontinent Diet (Please see discharge summary)  AMBULATORY STATUS COMMUNICATION OF NEEDS Skin   Extensive Assist Verbally Other (Comment), PU Stage and Appropriate Care (Pressure Injury Toe Anterior;Left Unstageable; Wound Arm Lower;Posterior;Right; Pressure Injury Toe Anterior;Left Unstageable; Pressure Injury Coccyx Mid Stage 1)                       Personal Care Assistance Level of Assistance  Bathing, Dressing, Feeding Bathing Assistance: Maximum assistance Feeding assistance: Maximum assistance Dressing Assistance: Maximum assistance     Functional Limitations Info             SPECIAL CARE FACTORS FREQUENCY  PT (By licensed PT), OT (By licensed OT)     PT Frequency: 5x OT Frequency: 5x            Contractures Contractures Info: Not present    Additional Factors Info  Code Status, Allergies, Insulin  Sliding Scale Code Status Info: Full Code Allergies Info: NKA   Insulin  Sliding Scale Info:  Please see discharge summary       Current Medications (12/10/2023):  This is the current hospital active medication list Current Facility-Administered Medications  Medication Dose Route Frequency Provider Last Rate Last Admin   acetaminophen  (TYLENOL ) tablet 650 mg  650 mg Oral  Q6H PRN Smith, Rondell A, MD       Or   acetaminophen  (TYLENOL ) suppository 650 mg  650 mg Rectal Q6H PRN Claudene Reeves A, MD       aspirin  EC tablet 81 mg  81 mg Oral Daily Claudene, Rondell A, MD   81 mg at 12/09/23 9191   atorvastatin  (LIPITOR) tablet 40 mg  40 mg Oral Daily Smith, Rondell A, MD   40 mg at 12/09/23 9191   cefTRIAXone  (ROCEPHIN ) 2 g in sodium chloride  0.9 % 100 mL IVPB  2 g Intravenous Q24H Joseph, Preetha, MD   Stopped at 12/09/23 1719   Chlorhexidine  Gluconate Cloth 2 % PADS 6 each  6 each Topical Daily Hunsucker, Donnice SAUNDERS, MD   6 each at 12/09/23 1500   docusate sodium  (COLACE) capsule 100 mg  100 mg Oral BID PRN Autry, Lauren E, PA-C       enoxaparin  (LOVENOX ) injection 40 mg  40 mg Subcutaneous Q24H Smith, Rondell A, MD   40 mg at 12/09/23 1649   ezetimibe  (ZETIA ) tablet 10 mg  10 mg Oral Daily Smith, Rondell A, MD   10 mg at 12/09/23 0808   feeding supplement (ENSURE PLUS HIGH PROTEIN) liquid 237 mL  237 mL Oral BID BM Bryn Motto B, MD   237 mL at 12/08/23 1619   furosemide  (LASIX ) injection 80 mg  80 mg Intravenous Once Hunsucker, Donnice SAUNDERS, MD       insulin  aspart (novoLOG ) injection 0-15 Units  0-15 Units Subcutaneous Q4H Autry, Lauren E, PA-C   3 Units at 12/10/23 1237   ipratropium-albuterol  (DUONEB) 0.5-2.5 (3) MG/3ML nebulizer solution 3 mL  3 mL Nebulization Q6H Autry, Lauren E, PA-C   3 mL at 12/10/23 1312   ipratropium-albuterol  (DUONEB) 0.5-2.5 (3) MG/3ML nebulizer solution 3 mL  3 mL Nebulization Q6H PRN Autry, Lauren E, PA-C       levothyroxine  (SYNTHROID ) tablet 50 mcg  50 mcg Oral Q0600 Smith, Rondell A, MD   50 mcg at 12/09/23 0606   linezolid  (ZYVOX ) IVPB 600 mg  600 mg Intravenous Q12H Fairy Frames, MD 300 mL/hr at 12/10/23 1049 600 mg at 12/10/23 1049   milrinone  (PRIMACOR ) 20 MG/100 ML (0.2 mg/mL) infusion  0.25 mcg/kg/min Intravenous Continuous Lee, [place], NP 6.66 mL/hr at 12/10/23 0600 0.25 mcg/kg/min at 12/10/23 0600   norepinephrine  (LEVOPHED )  4mg  in (0.016 mg/mL) premix infusion  0-40 mcg/min Intravenous Titrated Adolph Maxwell E, PA-C 67.5 mL/hr at 12/10/23 1235 18 mcg/min at 12/10/23 1235   Oral care mouth rinse  15 mL Mouth Rinse 4 times per day Hunsucker, Donnice SAUNDERS, MD   15 mL at 12/10/23 1240   Oral care mouth rinse  15 mL Mouth Rinse PRN Hunsucker, Donnice SAUNDERS, MD       polyethylene glycol (MIRALAX  / GLYCOLAX ) packet 17 g  17 g Oral Daily PRN Autry, Lauren E, PA-C       sodium chloride  flush (NS) 0.9 % injection 3 mL  3 mL Intravenous Q12H Smith, Rondell A, MD   3 mL at 12/10/23 1051     Discharge Medications: Please see discharge summary for a list of discharge medications.  Relevant Imaging Results:  Relevant Lab  Results:   Additional Information SS# [id number]  Lauraine FORBES Saa, LCSW

## 2023-12-10 NOTE — Progress Notes (Signed)
 eLink Physician-Brief Progress Note Patient Name: Robert Ramirez DOB: 12-25-1945 MRN: [id number]   Date of Service  12/10/2023  HPI/Events of Note  77yoM decomp RV failure, AKI, on BiPAP for pCO2 >100  Agitated, try to get out of bed, delirious  eICU Interventions  Olanzapine  overnight for agitation/delirium   0055 -despite earlier olanzapine , the patient is actively fighting the BiPAP machine and trying to pull off his bilateral mittens, not following commands.  Given palliative oriented care, use Precedex  for comfort  Intervention Category Minor Interventions: Agitation / anxiety - evaluation and management  Caria Transue 12/10/2023, 10:03 PM

## 2023-12-10 NOTE — Progress Notes (Addendum)
 Advanced Heart Failure Rounding Note  Cardiologist: Lurena MARLA Red, MD   Chief Complaint: RV Failure / HFpEF / PH  Subjective:    7/16: RHC - RA 16, PA mean 34, PCWP 11, TD CO/CI 3.8/1.8, unable to read Fick, PVR 6.0, PAPi 1.4.  Post cath transferred to ICU for hypercapneic respiratory failure. PCO2 > 100.  Currently on 16 NE and 0.25 milrinone .   2.4L UOP last 24 hrs. Continues on IV lasix  80 BID. Scr up slightly, 2.48.   Awake oriented to self but not time or situation. Follows some commands.   Objective:    Weight Range: 85.1 kg Body mass index is 24.75 kg/m.   Vital Signs:   Temp:  [97.2 F (36.2 C)-98 F (36.7 C)] 97.7 F (36.5 C) (07/17 0315) Pulse Rate:  [0-118] 105 (07/17 0530) Resp:  [11-31] 25 (07/17 0530) BP: (86-153)/(46-101) 132/63 (07/17 0500) SpO2:  [85 %-100 %] 90 % (07/17 0530) Arterial Line BP: (64-163)/(32-63) 147/56 (07/17 0530) FiO2 (%):  [36 %-100 %] 50 % (07/16 2022) Weight:  [85.1 kg-88.8 kg] 85.1 kg (07/17 0500) Last BM Date :  (PTA)  Weight change: Filed Weights   12/09/23 0440 12/09/23 1414 12/10/23 0500  Weight: 85.8 kg 88.8 kg 85.1 kg   Intake/Output:  Intake/Output Summary (Last 24 hours) at 12/10/2023 0659 Last data filed at 12/10/2023 0600 Gross per 24 hour  Intake 1322.2 ml  Output 2375 ml  Net -1052.8 ml    Physical Exam    General:  Acute on chronically ill appearing Neck: JVP ~ 10 Cor: Regular rate & rhythm with ectopy. 2/6 TR murmur. Lungs: clear anteriorly Abdomen: soft, nontender, nondistended.  Extremities: 1+ edema, improved from 2 days ago Neuro: awake. Oriented to self only.    Telemetry   V paced 100s, frequent PACs  Labs    CBC Recent Labs    12/07/23 0801 12/08/23 0401 12/09/23 0242 12/09/23 1255  WBC 18.2* 16.0* 14.8*  --   NEUTROABS 16.1*  --  12.8*  --   HGB 16.4 14.3 14.1 16.3  HCT 51.1 46.4 46.0 48.0  MCV 93.1 94.7 94.5  --   PLT 165 163 192  --    Basic Metabolic Panel Recent  Labs    12/09/23 0242 12/09/23 1255 12/10/23 0020  NA 134* 133* 133*  K 4.3 4.6 3.6  CL 91*  --  92*  CO2 33*  --  29  GLUCOSE 221*  --  196*  BUN 60*  --  67*  CREATININE 2.33*  --  2.48*  CALCIUM  9.7  --  9.5  MG 2.0  --  1.8   Liver Function Tests Recent Labs    12/07/23 0801 12/10/23 0020  AST 28 22  ALT 16 12  ALKPHOS 106 114  BILITOT 2.5* 1.0  PROT 6.1* 5.7*  ALBUMIN 3.0* 2.3*   BNP (last 3 results) Recent Labs    12/07/23 0816  BNP 1,195.7*   Thyroid  Function Tests Recent Labs    12/07/23 1000  TSH 2.423   Medications:    Scheduled Medications:  aspirin  EC  81 mg Oral Daily   atorvastatin   40 mg Oral Daily   Chlorhexidine  Gluconate Cloth  6 each Topical Daily   enoxaparin  (LOVENOX ) injection  40 mg Subcutaneous Q24H   ezetimibe   10 mg Oral Daily   feeding supplement  237 mL Oral BID BM   furosemide   80 mg Intravenous BID   insulin  aspart  0-15  Units Subcutaneous Q4H   ipratropium-albuterol   3 mL Nebulization Q6H   levothyroxine   50 mcg Oral Q0600   mouth rinse  15 mL Mouth Rinse 4 times per day   sodium chloride  flush  3 mL Intravenous Q12H   Infusions:  cefTRIAXone  (ROCEPHIN )  IV Stopped (12/09/23 1719)   dexmedetomidine  (PRECEDEX ) IV infusion     linezolid  (ZYVOX ) IV 300 mL/hr at 12/09/23 2300   milrinone  0.25 mcg/kg/min (12/10/23 0500)   norepinephrine  (LEVOPHED ) Adult infusion 18 mcg/min (12/10/23 0500)   potassium chloride  10 mEq (12/10/23 0635)   PRN Medications: acetaminophen  **OR** acetaminophen , docusate sodium , ipratropium-albuterol , mouth rinse, polyethylene glycol  Patient Profile   78 y.o. male with history of CAD, HFpEF with RV failure, DM, HTN, HLD, OSA and chronic respiratory failure on BiPAP, CHB s/p dual chamber PPM with LBBAP lead in 01/25, hx asbestos exposure with pleural plaque on prior HRCT but no ILD, remote tobacco use.   Admitted with acute on chronic HFpEF with RV failure.  Assessment/Plan   Acute on chronic  HFpEF with RV failure >> cardiogenic shock Pulmonary hypertension - Evidence of RV dysfunction on echo dating back to 2019.  - L/RHC in 02/2018: Nonobstructive CAD involving RCA, PCWP mean 8, PA 46/16 (26), Fick CO/CI 4.45/2.07, TD CO/CI 3.64/1.69.  - Echo with LVEF 60-65%, interventricular septum is flattened, RV enlarged and HK, RVSP 58 mmHg, moderate TR - RHC 12/09/23: RA 16, PA mean 34, PCWP 11, TD CO/CI 3.8/1.8, unable to read Fick, PVR 6.0, PAPi 1.4 - Most likely WHO group III pulmonary hypertension in setting of longstanding respiratory failure/OSA. Reports adherence with home BiPAP. However, PCO2 >100 here. - PFTs in 2020 with mixed restrictive and obstructive defect. CT w/o evidence of ILD. - Appears end stage. Now on 16 NE + 0.25 milrinone . Check CO-OX. - Volume improved with IV lasix  80 BID. Has one additional dose ordered for this am. Not sure he would tolerate additional diuresis beyond this given PCWP of 11 on RHC. - Likely little benefit to adding pulmonary vasodilators. No good options. Palliative Care has been consulted.   2. AKI on CKD IIIa - Scr baseline 1.3-1.4, up to 2.48 - In setting of low-output/end stage PAH/cor pulmonale   3. Possible cellulitis - Abx per primary team - Afebrile but does have leukocytosis   4. CHB s/p dual chamber PPM w/ LBBAP lead Possible atrial fibrillation - Device interrogated by [employer] rep. Several very brief runs of atrial flutter noted, just seconds in duration.  - SR with frequent PACs    5. Hyperbilirubinemia - T bili up to 3 - RUQ US  w/o cirrhosis  6. 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 No good options for managing his pulmonary hypertension and RV failure. -Palliative Care consulted as above  Critical care time 22 mins  Length of Stay: 3  FINCH, LINDSAY N, PA-C  12/10/2023, 6:59 AM  Advanced Heart  Failure Team Pager [phone] (M-F; 7a - 5p)  Please contact [facility] for night-coverage after hours (5p -7a ) and weekends on [email or web address]  Agree with above  He is on NE 16 and milrinone  0.25. Very confused this am. In restraints   Off Bipap this am  Scr up to 2.5  LA 0.7.   No ABG this am  Co-ox 97% (inaccurate)  General:  Ill appearing. No resp difficulty HEENT: normal Neck:  supple. + RIJ swan  Cor: Reg 2/6 TR Lungs:coarse Abdomen: soft, nontender, nondistended. No hepatosplenomegaly. No bruits or masses. Good bowel sounds. Extremities: no cyanosis, clubbing, rash, 1+ edema + erythema Neuro: confused. Moves all 4  He has end-stage PAH/RV failure. Given degree of hypercapnea he has had, I suspect this is predominantly WHO GROUP III disease despite his PFTs.   Also concern for septic physiology.   Would check PCT, repeat ABG and co-ox. Continue to support with NE and milrinone . If recovers can have cautious trial of PDE-5 but suspect benefit potential benefit is minimal.   Would consider Palliative Care consult  CRITICAL CARE Performed by: Daria Mcmeekin  Total critical care time: 45 minutes  Critical care time was exclusive of separately billable procedures and treating other patients.  Critical care was necessary to treat or prevent imminent or life-threatening deterioration.  Critical care was time spent personally by me (independent of midlevel providers or residents) on the following activities: development of treatment plan with patient and/or surrogate as well as nursing, discussions with consultants, evaluation of patient's response to treatment, examination of patient, obtaining history from patient or surrogate, ordering and performing treatments and interventions, ordering and review of laboratory studies, ordering and review of radiographic studies, pulse oximetry and re-evaluation of patient's condition.  Toribio Fuel, MD  10:26 AM

## 2023-12-10 NOTE — Plan of Care (Signed)
  Problem: Education: Goal: Ability to describe self-care measures that may prevent or decrease complications (Diabetes Survival Skills Education) will improve Outcome: Progressing   Problem: Fluid Volume: Goal: Ability to maintain a balanced intake and output will improve Outcome: Progressing   Problem: Metabolic: Goal: Ability to maintain appropriate glucose levels will improve Outcome: Progressing   Problem: Tissue Perfusion: Goal: Adequacy of tissue perfusion will improve Outcome: Progressing   Problem: Skin Integrity: Goal: Risk for impaired skin integrity will decrease Outcome: Progressing   Problem: Nutritional: Goal: Progress toward achieving an optimal weight will improve Outcome: Progressing   Problem: Skin Integrity: Goal: Skin integrity will improve Outcome: Progressing   Problem: Clinical Measurements: Goal: Will remain free from infection Outcome: Progressing   Problem: Cardiovascular: Goal: Ability to achieve and maintain adequate cardiovascular perfusion will improve Outcome: Progressing    Plan of care, assessment, monitoring, treatment, and intervention (s) ongoing, see MAR see flowsheet

## 2023-12-10 NOTE — Consult Note (Addendum)
 Consultation Note Date: 12/10/2023   Patient Name: Robert Ramirez  DOB: [birth year]  MRN: [id number]  Age / Sex: 78 y.o., male  PCP: Robert Greig PARAS, NP Referring Physician: Annella Donnice SAUNDERS, MD  Reason for Consultation:  likely end-stage RV failure and HFpEF  HPI/Patient Profile: 78 y.o. male  with past medical history of HLD, HTN, CAD, HFpEF, complete heart block s/p pacemaker admitted on 12/07/2023 with weakness, SOB, lower extremity edema. He was admitted and treatment started for LE cellulitis, and heart failure. ECHO this admission showed EF 60-65% and enlarged RV. 7/15 he had hypotension and received multiple fluid boluses. Right heart cath performed 7/16. Recovery is complicated due to difficulty tolerating diuresis with  hypotension and worsening creatinine. He has also had poor mental status likely related to hypercarbia and/or delirium. May require CRRT. He is on milrinone  and norepinephrine . Prognosis is poor with end stage R heart failure and pulmonary hypertension. Palliative consulted for GOC.   Primary Decision Maker NEXT OF KIN - spouse  Discussion: Chart reviewed including labs, progress notes, imaging from this and previous encounters.  ECHO and cath results reviewed. Labs today show Cr 2.48, BUN 67. Blood gas results are pending. Chest xray from yesterday reviewed- pleural plaques noted, small pleural effusions.  He does not have advanced directives on his chart.  On evaluation patient was somnolent. He did not respond to my voice or touch.  I met with patient's spouse at the bedside.  Patient lives at home with her. He is retired. Prior to admission he was independent with ADL's. Has several chickens that he enjoys caring for.  Santa expressed that she was shocked to see [name]'s state. She did not realize how much he had decompensated. Her expectation for bringing him in to the hospital was  that he would improve quickly and go home.  I reviewed [name]'s hospital stay with her. I discussed the pathophysiology of heart failure and the relationship of the kidneys to the heart. I discussed with her all interventions and medications that patient is currently receiving. We also discussed the complications he is experiencing and barriers that are presenting to his improvement.  Santa continued to state that she felt that the hospital was not doing enough for [name]. I assured her that he is receiving highest standard of care by a specialized heart failure and ICU team. I gently reviewed that sometimes, despite all best efforts of modern medicine we are unable to return patients to their prior state of function.  Advanced Care Planning- 20 mins With her permission I lightly touched on advanced care planning due to [name]'s multiple comorbidities. [name] does not have advanced directives or a living will.  They have never had discussions about his feelings regarding artificial life support.  Santa wishes for all aggressive life prolonging interventions.  I discussed with Santa the concept of hoping for the best- but also counseled her to be prepared that we may need to have further discussions regarding this subject if [name] fails to improve.  SUMMARY OF RECOMMENDATIONS -End stage heart failure, complicated by AKI and hypotension making diuresis difficult and AMS due to hypercarbia- continue current interventions, full code -Recommend daily updates be provided to family by attending team by phone call if they aren't present -PMT will continue to follow and discuss GOC- family was not in good place today for full GOC discussion due to their shock at his decline -Spouse agreed to continued followup with PMT -Discussed with attending team  Code Status/Advance Care Planning:   Code Status: Full Code    Prognosis:   Unable to determine  Discharge Planning: To Be Determined  Primary  Diagnoses: Present on Admission:  (HFpEF) heart failure with preserved ejection fraction (HCC)  Elevated troponin  SIRS (systemic inflammatory response syndrome) (HCC)  Acute kidney injury superimposed on chronic kidney disease (HCC)  Hypertension associated with diabetes (HCC)  Acquired hypothyroidism  History of permanent cardiac pacemaker placement  OSA treated with BiPAP   Review of Systems  Unable to perform ROS: Mental status change    Physical Exam Vitals and nursing note reviewed.  Constitutional:      Appearance: He is ill-appearing.  Cardiovascular:     Rate and Rhythm: Normal rate.  Pulmonary:     Effort: Pulmonary effort is normal.  Neurological:     Comments: somnolent     Vital Signs: BP (!) 72/43   Pulse (!) 118   Temp 99 F (37.2 C) (Oral)   Resp (!) 24   Ht 6' 1 (1.854 m)   Wt 85.1 kg   SpO2 99%   BMI 24.75 kg/m  Pain Scale: PAINAD POSS *See Group Information*: 2-Acceptable,Slightly drowsy, easily aroused Pain Score: 0-No pain   SpO2: SpO2: 99 % O2 Device:SpO2: 99 % O2 Flow Rate: .O2 Flow Rate (L/min): 4 L/min  IO: Intake/output summary:  Intake/Output Summary (Last 24 hours) at 12/10/2023 1432 Last data filed at 12/10/2023 1235 Gross per 24 hour  Intake 1353.57 ml  Output 4200 ml  Net -2846.43 ml    LBM: Last BM Date :  (PTA) Baseline Weight: Weight: 79.4 kg Most recent weight: Weight: 85.1 kg       Thank you for this consult. Palliative medicine will continue to follow and assist as needed.   Signed by: Robert Ramirez, AGNP-C Palliative Medicine  Time includes:   Preparing to see the patient (e.g., review of tests) Obtaining and/or reviewing separately obtained history Performing a medically necessary appropriate examination and/or evaluation Counseling and educating the patient/family/caregiver Ordering medications, tests, or procedures Referring and communicating with other health care professionals (when not reported  separately) Documenting clinical information in the electronic or other health record Independently interpreting results (not reported separately) and communicating results to the patient/family/caregiver Care coordination (not reported separately) Clinical documentation   Please contact Palliative Medicine Team phone at [phone] for questions and concerns.  For individual provider: See Tracey

## 2023-12-10 NOTE — Progress Notes (Signed)
 BLE venous exam is completed. Teffany Blaszczyk, RVT

## 2023-12-10 NOTE — Plan of Care (Signed)
  Problem: Fluid Volume: Goal: Ability to maintain a balanced intake and output will improve Outcome: Progressing   Problem: Skin Integrity: Goal: Risk for impaired skin integrity will decrease Outcome: Progressing   Problem: Tissue Perfusion: Goal: Adequacy of tissue perfusion will improve Outcome: Progressing   Problem: Clinical Measurements: Goal: Ability to avoid or minimize complications of infection will improve Outcome: Progressing   Problem: Clinical Measurements: Goal: Diagnostic test results will improve Outcome: Progressing Goal: Respiratory complications will improve Outcome: Progressing Goal: Cardiovascular complication will be avoided Outcome: Progressing

## 2023-12-11 ENCOUNTER — Inpatient Hospital Stay (HOSPITAL_COMMUNITY)

## 2023-12-11 DIAGNOSIS — N179 Acute kidney failure, unspecified: Secondary | ICD-10-CM | POA: Diagnosis not present

## 2023-12-11 DIAGNOSIS — R6510 Systemic inflammatory response syndrome (SIRS) of non-infectious origin without acute organ dysfunction: Secondary | ICD-10-CM | POA: Diagnosis not present

## 2023-12-11 DIAGNOSIS — Z515 Encounter for palliative care: Secondary | ICD-10-CM | POA: Diagnosis not present

## 2023-12-11 DIAGNOSIS — J9622 Acute and chronic respiratory failure with hypercapnia: Secondary | ICD-10-CM | POA: Diagnosis not present

## 2023-12-11 DIAGNOSIS — R17 Unspecified jaundice: Secondary | ICD-10-CM | POA: Diagnosis not present

## 2023-12-11 DIAGNOSIS — I272 Pulmonary hypertension, unspecified: Secondary | ICD-10-CM | POA: Diagnosis not present

## 2023-12-11 DIAGNOSIS — N1831 Chronic kidney disease, stage 3a: Secondary | ICD-10-CM | POA: Diagnosis not present

## 2023-12-11 DIAGNOSIS — I50810 Right heart failure, unspecified: Secondary | ICD-10-CM | POA: Diagnosis not present

## 2023-12-11 DIAGNOSIS — I5033 Acute on chronic diastolic (congestive) heart failure: Secondary | ICD-10-CM | POA: Diagnosis not present

## 2023-12-11 LAB — POCT I-STAT 7, (LYTES, BLD GAS, ICA,H+H)
Acid-Base Excess: 12 mmol/L — ABNORMAL HIGH (ref 0.0–2.0)
Bicarbonate: 42.3 mmol/L — ABNORMAL HIGH (ref 20.0–28.0)
Calcium, Ion: 1.38 mmol/L (ref 1.15–1.40)
HCT: 46 % (ref 39.0–52.0)
Hemoglobin: 15.6 g/dL (ref 13.0–17.0)
O2 Saturation: 97 %
Patient temperature: 97.1
Potassium: 3.4 mmol/L — ABNORMAL LOW (ref 3.5–5.1)
Sodium: 134 mmol/L — ABNORMAL LOW (ref 135–145)
TCO2: 45 mmol/L — ABNORMAL HIGH (ref 22–32)
pCO2 arterial: 76.8 mmHg (ref 32–48)
pH, Arterial: 7.345 — ABNORMAL LOW (ref 7.35–7.45)
pO2, Arterial: 100 mmHg (ref 83–108)

## 2023-12-11 LAB — GLUCOSE, CAPILLARY
Glucose-Capillary: 115 mg/dL — ABNORMAL HIGH (ref 70–99)
Glucose-Capillary: 123 mg/dL — ABNORMAL HIGH (ref 70–99)
Glucose-Capillary: 135 mg/dL — ABNORMAL HIGH (ref 70–99)
Glucose-Capillary: 148 mg/dL — ABNORMAL HIGH (ref 70–99)
Glucose-Capillary: 152 mg/dL — ABNORMAL HIGH (ref 70–99)
Glucose-Capillary: 173 mg/dL — ABNORMAL HIGH (ref 70–99)
Glucose-Capillary: 202 mg/dL — ABNORMAL HIGH (ref 70–99)

## 2023-12-11 LAB — BASIC METABOLIC PANEL WITH GFR
Anion gap: 13 (ref 5–15)
BUN: 59 mg/dL — ABNORMAL HIGH (ref 8–23)
CO2: 35 mmol/L — ABNORMAL HIGH (ref 22–32)
Calcium: 10.2 mg/dL (ref 8.9–10.3)
Chloride: 87 mmol/L — ABNORMAL LOW (ref 98–111)
Creatinine, Ser: 1.93 mg/dL — ABNORMAL HIGH (ref 0.61–1.24)
GFR, Estimated: 35 mL/min — ABNORMAL LOW (ref >60)
Glucose, Bld: 140 mg/dL — ABNORMAL HIGH (ref 70–99)
Potassium: 3.1 mmol/L — ABNORMAL LOW (ref 3.5–5.1)
Sodium: 135 mmol/L (ref 135–145)

## 2023-12-11 LAB — CBC
HCT: 43.5 % (ref 39.0–52.0)
Hemoglobin: 14 g/dL (ref 13.0–17.0)
MCH: 29.1 pg (ref 26.0–34.0)
MCHC: 32.2 g/dL (ref 30.0–36.0)
MCV: 90.4 fL (ref 80.0–100.0)
Platelets: 186 K/uL (ref 150–400)
RBC: 4.81 MIL/uL (ref 4.22–5.81)
RDW: 13.6 % (ref 11.5–15.5)
WBC: 9.4 K/uL (ref 4.0–10.5)
nRBC: 0 % (ref 0.0–0.2)

## 2023-12-11 LAB — PHOSPHORUS: Phosphorus: 1.8 mg/dL — ABNORMAL LOW (ref 2.5–4.6)

## 2023-12-11 LAB — MAGNESIUM: Magnesium: 1.8 mg/dL (ref 1.7–2.4)

## 2023-12-11 MED ORDER — DEXMEDETOMIDINE HCL IN NACL 400 MCG/100ML IV SOLN
0.0000 ug/kg/h | INTRAVENOUS | Status: DC
  Administered 2023-12-11: 0.4 ug/kg/h via INTRAVENOUS
  Filled 2023-12-11 (×2): qty 100

## 2023-12-11 MED ORDER — OLANZAPINE 10 MG IM SOLR
10.0000 mg | Freq: Every day | INTRAMUSCULAR | Status: DC
  Administered 2023-12-11: 10 mg via INTRAVENOUS
  Filled 2023-12-11: qty 10

## 2023-12-11 MED ORDER — VITAL 1.5 CAL PO LIQD
1000.0000 mL | ORAL | Status: DC
  Administered 2023-12-11 – 2023-12-14 (×3): 1000 mL

## 2023-12-11 MED ORDER — ADULT MULTIVITAMIN W/MINERALS CH
1.0000 | ORAL_TABLET | Freq: Every day | ORAL | Status: DC
  Administered 2023-12-11 – 2023-12-14 (×4): 1
  Filled 2023-12-11 (×4): qty 1

## 2023-12-11 MED ORDER — NICOTINE 14 MG/24HR TD PT24
14.0000 mg | MEDICATED_PATCH | Freq: Every day | TRANSDERMAL | Status: DC
  Administered 2023-12-11 – 2023-12-17 (×7): 14 mg via TRANSDERMAL
  Filled 2023-12-11 (×9): qty 1

## 2023-12-11 MED ORDER — ACETAMINOPHEN 160 MG/5ML PO SOLN
650.0000 mg | Freq: Four times a day (QID) | ORAL | Status: DC | PRN
  Administered 2023-12-14: 650 mg
  Filled 2023-12-11: qty 20.3

## 2023-12-11 MED ORDER — PROSOURCE TF20 ENFIT COMPATIBL EN LIQD
60.0000 mL | Freq: Every day | ENTERAL | Status: DC
  Administered 2023-12-11 – 2023-12-17 (×6): 60 mL
  Filled 2023-12-11 (×7): qty 60

## 2023-12-11 MED ORDER — AMIODARONE HCL IN DEXTROSE 360-4.14 MG/200ML-% IV SOLN
30.0000 mg/h | INTRAVENOUS | Status: DC
  Administered 2023-12-12 – 2023-12-14 (×5): 30 mg/h via INTRAVENOUS
  Filled 2023-12-11 (×6): qty 200

## 2023-12-11 MED ORDER — ACETAMINOPHEN 650 MG RE SUPP: 650.0000 mg | Freq: Four times a day (QID) | RECTAL | Status: DC | PRN

## 2023-12-11 MED ORDER — POTASSIUM CHLORIDE 10 MEQ/50ML IV SOLN
10.0000 meq | INTRAVENOUS | Status: AC
  Administered 2023-12-11 (×4): 10 meq via INTRAVENOUS
  Filled 2023-12-11 (×4): qty 50

## 2023-12-11 MED ORDER — AMIODARONE HCL IN DEXTROSE 360-4.14 MG/200ML-% IV SOLN
60.0000 mg/h | INTRAVENOUS | Status: AC
  Administered 2023-12-11 (×2): 60 mg/h via INTRAVENOUS

## 2023-12-11 MED ORDER — STERILE WATER FOR INJECTION IJ SOLN
INTRAMUSCULAR | Status: AC
  Administered 2023-12-11: 10 mL
  Filled 2023-12-11: qty 10

## 2023-12-11 MED ORDER — CEFAZOLIN SODIUM-DEXTROSE 1-4 GM/50ML-% IV SOLN
1.0000 g | Freq: Three times a day (TID) | INTRAVENOUS | Status: AC
  Administered 2023-12-11 – 2023-12-13 (×7): 1 g via INTRAVENOUS
  Filled 2023-12-11 (×8): qty 50

## 2023-12-11 MED ORDER — MAGNESIUM SULFATE 2 GM/50ML IV SOLN
2.0000 g | Freq: Once | INTRAVENOUS | Status: AC
  Administered 2023-12-11: 2 g via INTRAVENOUS
  Filled 2023-12-11: qty 50

## 2023-12-11 MED ORDER — ACETAMINOPHEN 325 MG PO TABS: 650.0000 mg | ORAL_TABLET | Freq: Four times a day (QID) | ORAL | Status: DC | PRN

## 2023-12-11 MED ORDER — THIAMINE MONONITRATE 100 MG PO TABS
100.0000 mg | ORAL_TABLET | Freq: Every day | ORAL | Status: DC
  Administered 2023-12-11 – 2023-12-14 (×4): 100 mg
  Filled 2023-12-11 (×4): qty 1

## 2023-12-11 MED ORDER — VITAL HP 1.0 CAL PO LIQD: 1000.0000 mL | ORAL | Status: DC

## 2023-12-11 MED ORDER — AMIODARONE HCL IN DEXTROSE 360-4.14 MG/200ML-% IV SOLN
60.0000 mg/h | INTRAVENOUS | Status: DC
  Filled 2023-12-11: qty 200

## 2023-12-11 NOTE — TOC Progression Note (Signed)
 Transition of Care [facility]) - Progression Note    Patient Details  Name: Robert Ramirez MRN: [id number] Date of Birth: 1945-12-07  Transition of Care [facility]) CM/SW Contact  Lauraine FORBES Saa, LCSW Phone Number: 12/11/2023, 11:32 AM  Clinical Narrative:     11:32 AM CSW left patient's current SNF options [facility], [address], [place], [place], [place], [facility], [facility], [facility], [place], [facility]) with their quarterly Medicare ratings at patient's bedside as consented by patient's spouse, Santa.  Expected Discharge Plan: Skilled Nursing Facility Barriers to Discharge: Continued Medical Work up, English as a second language teacher, SNF Pending bed offer  Expected Discharge Plan and Services In-house Referral: Clinical Social Work   Post Acute Care Choice: Skilled Nursing Facility Living arrangements for the past 2 months: Single Family Home                                       Social Determinants of Health (SDOH) Interventions SDOH Screenings   Food Insecurity: No Food Insecurity (12/08/2023)  Housing: Low Risk  (12/08/2023)  Transportation Needs: No Transportation Needs (12/08/2023)  Utilities: Not At Risk (12/08/2023)  Alcohol Screen: Low Risk  (08/06/2023)  Depression (PHQ2-9): Low Risk  (09/30/2023)  Financial Resource Strain: Low Risk  (08/06/2023)  Physical Activity: Sufficiently Active (08/06/2023)  Social Connections: Moderately Isolated (12/08/2023)  Stress: No Stress Concern Present (08/06/2023)  Tobacco Use: High Risk (12/07/2023)  Health Literacy: Adequate Health Literacy (08/06/2023)    Readmission Risk Interventions     No data to display

## 2023-12-11 NOTE — Progress Notes (Signed)
 Daily Progress Note   Patient Name: Robert Ramirez       Date: 12/11/2023 DOB: 26-Jan-1946  Age: 78 y.o. MRN#: [id number] Attending Physician: Annella Donnice SAUNDERS, MD Primary Care Physician: Lorren Greig PARAS, NP Admit Date: 12/07/2023  Reason for Consultation/Follow-up: Establishing goals of care  Length of Stay: 4  Current Medications: Scheduled Meds:   aspirin  EC  81 mg Oral Daily   atorvastatin   40 mg Oral Daily   Chlorhexidine  Gluconate Cloth  6 each Topical Daily   enoxaparin  (LOVENOX ) injection  40 mg Subcutaneous Q24H   ezetimibe   10 mg Oral Daily   feeding supplement (PROSource TF20)  60 mL Per Tube Daily   insulin  aspart  0-15 Units Subcutaneous Q4H   ipratropium-albuterol   3 mL Nebulization Q6H   levothyroxine   50 mcg Oral Q0600   multivitamin with minerals  1 tablet Per Tube Daily   nicotine  14 mg Transdermal Daily   OLANZapine   10 mg Intravenous QHS   mouth rinse  15 mL Mouth Rinse 4 times per day   sodium chloride  flush  3 mL Intravenous Q12H   thiamine  100 mg Per Tube Daily    Continuous Infusions:   ceFAZolin  (ANCEF ) IV     feeding supplement (VITAL 1.5 CAL)     norepinephrine  (LEVOPHED ) Adult infusion Stopped (12/11/23 1154)   potassium chloride  10 mEq (12/11/23 1224)    PRN Meds: acetaminophen  **OR** acetaminophen , docusate sodium , ipratropium-albuterol , mouth rinse, polyethylene glycol  Physical Exam Vitals reviewed.  Constitutional:      General: He is not in acute distress.    Appearance: He is ill-appearing.  Cardiovascular:     Rate and Rhythm: Tachycardia present.  Pulmonary:     Effort: Tachypnea present.             Vital Signs: BP 99/76   Pulse (!) 102   Temp (!) 96.7 F (35.9 C) (Axillary)   Resp (!) 28   Ht 6' 1 (1.854 m)   Wt 81  kg   SpO2 100%   BMI 23.56 kg/m  SpO2: SpO2: 100 % O2 Device: O2 Device: Nasal Cannula O2 Flow Rate: O2 Flow Rate (L/min): 3 L/min       Palliative Assessment/Data:      Patient Active Problem List   Diagnosis Date Noted   (  HFpEF) heart failure with preserved ejection fraction (HCC) 12/07/2023   Cellulitis 12/07/2023   Elevated troponin 12/07/2023   SIRS (systemic inflammatory response syndrome) (HCC) 12/07/2023   Uncontrolled type 2 diabetes mellitus with hyperglycemia, with long-term current use of insulin  (HCC) 12/07/2023   Acute kidney injury superimposed on chronic kidney disease (HCC) 12/07/2023   History of permanent cardiac pacemaker placement 12/07/2023   OSA treated with BiPAP 12/07/2023   Acute on chronic right-sided heart failure (HCC) 12/07/2023   AV block 06/12/2023   Complete heart block (HCC) 05/28/2023   Poorly-controlled hypertension 04/05/2019   Conductive hearing loss 04/05/2019   Chewing tobacco use 04/05/2019   Bilateral impacted cerumen 04/05/2019   Primary hyperparathyroidism (HCC) 11/21/2018   Chronic respiratory failure with hypoxia and hypercapnia (HCC) 11/18/2018   Chronic obstructive asthma (with obstructive pulmonary disease) (HCC) 06/09/2018   Other secondary pulmonary hypertension (HCC) 06/09/2018   Healthcare maintenance 03/30/2018   Serum total bilirubin elevated 03/26/2018   Asbestos exposure 03/26/2018   Lesion of skin of scalp 03/26/2018   Coronary artery disease involving native coronary artery of native heart without angina pectoris 03/26/2018   Acquired hypothyroidism 03/26/2018   Pleural plaque 03/14/2018   Diabetes (HCC) 03/14/2018   Somnolence    Hyponatremia    Abnormal chest x-ray    Hypertension associated with diabetes (HCC) 07/18/2014   Mixed hyperlipidemia 07/18/2014   Diabetic retinopathy associated with type 2 diabetes mellitus (HCC) 07/18/2014    Palliative Care Assessment & Plan   Patient Profile: 78 y.o. male   with past medical history of HLD, HTN, CAD, HFpEF, complete heart block s/p pacemaker admitted on 12/07/2023 with weakness, SOB, lower extremity edema. He was admitted and treatment started for LE cellulitis, and heart failure. ECHO this admission showed EF 60-65% and enlarged RV. 7/15 he had hypotension and received multiple fluid boluses. Right heart cath performed 7/16. Recovery is complicated due to difficulty tolerating diuresis with  hypotension and worsening creatinine. He has also had poor mental status likely related to hypercarbia and/or delirium. May require CRRT. He is on milrinone  and norepinephrine . Prognosis is poor with end stage R heart failure and pulmonary hypertension. Palliative consulted for GOC.   Today's Discussion: Chart reviewed and updates received from nursing and CCM provider. CCM gave family update. Patient receiving patient care. No family at bedside.  Attempted to call patient's wife Robert Ramirez twice. No voicemail set up. Will reach out again tomorrow.   Recommendations/Plan: Full code Full scope Continued PMT support    Code Status:    Code Status Orders  (From admission, onward)           Start     Ordered   12/09/23 1403  Full code  Continuous       Question:  By:  Answer:  Default: patient does not have capacity for decision making, no surrogate or prior directive available   12/09/23 1405         Extensive chart review has been completed prior to seeing the patient including labs, vital signs, imaging, progress/consult notes, orders, medications, and available advance directive documents.  Care plan was discussed with Dr, Annella and bedside RN  Time spent: 25 minutes  Thank you for allowing the Palliative Medicine Team to assist in the care of this patient.    Stephane CHRISTELLA Palin, NP  Please contact Palliative Medicine Team phone at [phone] for questions and concerns.

## 2023-12-11 NOTE — Procedures (Signed)
 Cortrak  Person Inserting Tube:  Robert Ramirez, Robert Ramirez, RD Tube Type:  Cortrak - 43 inches Tube Size:  10 Tube Location:  Right nare Secured by: Bridle Technique Used to Measure Tube Placement:  Marking at nare/corner of mouth Cortrak Secured At:  73 cm   Cortrak Tube Team Note:  Consult received to place a Cortrak feeding tube.   X-ray is required. RN may begin using tube after placement confirmed via x-ray.   If the tube becomes dislodged please keep the tube and contact the Cortrak team at [email or web address] for replacement.  If after hours and replacement cannot be delayed, place a NG tube and confirm placement with an abdominal x-ray.    [place] Robert Ramirez RD, LDN Clinical Dietitian

## 2023-12-11 NOTE — Progress Notes (Signed)
 Initial Nutrition Assessment  DOCUMENTATION CODES:   Not applicable  INTERVENTION:   -D/c Ensure Plus High Protein po BID, each supplement provides 350 kcal and 20 grams of protein, due to NPO status  -Once cortrak placement is verified:  Initiate Vital 1.5 @ 20 ml/hr and increase by 10 ml every 8 hours to goal rate of 60 ml/hr.   60 ml Prosource TF20 daily  Tube feeding regimen provides 2240 kcal (100% of needs), 117 grams of protein, and 1100 ml of H2O.    -MVI with minerals daily via tube -100 mg thiamine daily x 7 days via tube -1 packet Juven BID via tube, each packet provides 95 calories, 2.5 grams of protein (collagen), and 9.8 grams of carbohydrate (3 grams sugar); also contains 7 grams of L-arginine and L-glutamine, 300 mg vitamin C, 15 mg vitamin E, 1.2 mcg vitamin B-12, 9.5 mg zinc, 200 mg calcium , and 1.5 g  Calcium  Beta-hydroxy-Beta-methylbutyrate to support wound healing  -Monitor Mg, K, and Phos and replete as appropriate secondary to high refeeding risk  NUTRITION DIAGNOSIS:   Inadequate oral intake related to inability to eat as evidenced by NPO status.  GOAL:   Patient will meet greater than or equal to 90% of their needs  MONITOR:   Diet advancement, TF tolerance  REASON FOR ASSESSMENT:   Consult Assessment of nutrition requirement/status  ASSESSMENT:   Pt with medical history significant of hypertension, hyperlipidemia, HFpEF, heart block s/p PPM, and diabetes mellitus type 2  presents with leg weakness and swelling.  Pt admitted with heart failure, pulmonary HTN, and cellulitis vs venous stasis of bilateral lower extremities.   7/16- s/p rt heart cath  Reviewed I/O's: -2.8 L x 24 hours and -3.1 L since admission  UOP: 4.5 L x 24 hours  Pt unavailable at time of visit. Attempted to speak with pt via call to hospital room phone, however, unable to reach. RD unable to obtain further nutrition-related history or complete nutrition-focused physical  exam at this time.    Per H&P, pt has experienced progressive leg weakness and swelling for 1.5 weeks PTA (Jardiance  usually helps with swelling). He has been sleeping in a recliner chair secondary to respiratory issues. Leg swelling has caused a decreased in daily mobility/ function over the past few weeks; pt reports being independent with ADLS prior to the past few months.   Per PCCM notes, pt with significant sot tissue/ skin mass on the back of the head (MRI in 2019 concerning for malignancy); pt did not pursue further work-up on the mass.   Pt has been experiencing episodes of lethargy and delirium since 12/09/23, but mental status continues to decline. He is minimally responsive per MD.   Pt has been NPO since 12/09/23. Pt was previously on a heart healthy diet from 7/14-7/15. Meal completions documented at 100%.   Reviewed wt hx; no wt loss noted over the past 6 months. Per nursing assessment, pt with mild edema, which may be masking true weight loss as well as fat and muscle depletions.   Pt is at risk for malnutrition, however, unable to identify at this time. Pt is also at risk for refeeding syndrome, so will check/ monitor labs and add thiamine to help minimize risk.   Case discussed with RN and MD. Received permission to start TF once cortrak is placed. Also discussed with cortrak team.   Palliative care following for goals of care discussions. Noted pt with likely end stage heart failure. AKI and hypotension  are making diuresis difficulty and pt is a poor dialysis candidate. Plan full code and current interventions for now. Per MD notes, pt with overall poor prognosis.   Per TOC notes, plan for SNF placement once medically stable.   Medications reviewed and include lovenox , levophed , and potassium chloride .    Lab Results  Component Value Date   HGBA1C 8.0 (A) 09/30/2023   PTA DM medications are 1000 mg metformin  BID, 10 mg jardiance  dauly and 33 units insulin  NPH BID.   Labs  reviewed: Na: 134, K: 3.4, CBGS: 148-227 (inpatient orders for glycemic control are 0-15 units insulin  aspart every 4 hours).    Diet Order:   Diet Order             Diet NPO time specified  Diet effective now                   EDUCATION NEEDS:   Not appropriate for education at this time  Skin:  Skin Assessment: Skin Integrity Issues: Skin Integrity Issues:: Stage I, Unstageable Stage I: coccyx Unstageable: lt anterior toe  Last BM:  12/09/23 (type 6)  Height:   Ht Readings from Last 1 Encounters:  12/07/23 6' 1 (1.854 m)    Weight:   Wt Readings from Last 1 Encounters:  12/11/23 81 kg    Ideal Body Weight:  83.6 kg  BMI:  Body mass index is 23.56 kg/m.  Estimated Nutritional Needs:   Kcal:  2200-2400  Protein:  115-130 grams  Fluid:  2.0-2.2 L    Margery ORN, RD, LDN, CDCES Registered Dietitian III Certified Diabetes Care and Education Specialist If unable to reach this RD, please use RD Inpatient group chat on secure chat between hours of 8am-4 pm daily

## 2023-12-11 NOTE — Progress Notes (Signed)
 Advanced Heart Failure Rounding Note  Cardiologist: Lurena MARLA Red, MD   Chief Complaint: RV Failure / HFpEF / PH  Subjective:    7/16: RHC - RA 16, PA mean 34, PCWP 11, TD CO/CI 3.8/1.8, unable to read Fick, PVR 6.0, PAPi 1.4.  Post cath transferred to ICU for hypercapneic respiratory failure. PCO2 > 100.  Remains on NE and milrinone . Doses being titrated down. Good diuresis  Agitated overnight. Now minimally responsive to me.   Objective:    Weight Range: 81 kg Body mass index is 23.56 kg/m.   Vital Signs:   Temp:  [97.1 F (36.2 C)-99.2 F (37.3 C)] 97.1 F (36.2 C) (07/18 0717) Pulse Rate:  [60-120] 79 (07/18 1015) Resp:  [10-53] 21 (07/18 1015) BP: (72-167)/(42-140) 99/76 (07/18 0045) SpO2:  [93 %-100 %] 100 % (07/18 1015) Arterial Line BP: (65-186)/(32-71) 139/51 (07/18 1015) FiO2 (%):  [40 %] 40 % (07/18 0427) Weight:  [81 kg] 81 kg (07/18 0500) Last BM Date :  (PTA)  Weight change: Filed Weights   12/09/23 1414 12/10/23 0500 12/11/23 0500  Weight: 88.8 kg 85.1 kg 81 kg   Intake/Output:  Intake/Output Summary (Last 24 hours) at 12/11/2023 1031 Last data filed at 12/11/2023 0800 Gross per 24 hour  Intake 1484.68 ml  Output 4325 ml  Net -2840.32 ml    Physical Exam    General:  Ill appearing. Minimal eye opening to stimulation HEENT: normal Neck: supple. JVP 10 RIJ TLC .  Cor: Regular rate & rhythm. No rubs, gallops or murmurs. Lungs: ccoarse Abdomen: soft, nontender, nondistended. No hepatosplenomegaly. No bruits or masses. Good bowel sounds. Extremities: no cyanosis, clubbing, rash, 2-3+ edema on R 1-2+ on left + erythema Neuro: minimally responsive   Telemetry   Sinus with v-pacing. + PVCs and NSVT Personally reviewed   Labs    CBC Recent Labs    12/09/23 0242 12/09/23 1255 12/10/23 0911 12/10/23 1116 12/11/23 0722 12/11/23 1017  WBC 14.8*  --  15.8*  --  9.4  --   NEUTROABS 12.8*  --   --   --   --   --   HGB 14.1   < > 14.4    < > 14.0 15.6  HCT 46.0   < > 45.4   < > 43.5 46.0  MCV 94.5  --  92.7  --  90.4  --   PLT 192  --  221  --  186  --    < > = values in this interval not displayed.   Basic Metabolic Panel Recent Labs    92/82/74 0020 12/10/23 1116 12/11/23 0350 12/11/23 1017  NA 133*   < > 135 134*  K 3.6   < > 3.1* 3.4*  CL 92*  --  87*  --   CO2 29  --  35*  --   GLUCOSE 196*  --  140*  --   BUN 67*  --  59*  --   CREATININE 2.48*  --  1.93*  --   CALCIUM  9.5  --  10.2  --   MG 1.8  --  1.8  --    < > = values in this interval not displayed.   Liver Function Tests Recent Labs    12/10/23 0020  AST 22  ALT 12  ALKPHOS 114  BILITOT 1.0  PROT 5.7*  ALBUMIN 2.3*   BNP (last 3 results) Recent Labs    12/07/23 0816  BNP  1,195.7*   Thyroid  Function Tests No results for input(s): TSH, T4TOTAL, T3FREE, THYROIDAB in the last 72 hours.  Invalid input(s): FREET3  Medications:    Scheduled Medications:  aspirin  EC  81 mg Oral Daily   atorvastatin   40 mg Oral Daily   Chlorhexidine  Gluconate Cloth  6 each Topical Daily   enoxaparin  (LOVENOX ) injection  40 mg Subcutaneous Q24H   ezetimibe   10 mg Oral Daily   feeding supplement  237 mL Oral BID BM   insulin  aspart  0-15 Units Subcutaneous Q4H   ipratropium-albuterol   3 mL Nebulization Q6H   levothyroxine   50 mcg Oral Q0600   OLANZapine   10 mg Intravenous QHS   mouth rinse  15 mL Mouth Rinse 4 times per day   sodium chloride  flush  3 mL Intravenous Q12H   Infusions:   ceFAZolin  (ANCEF ) IV     milrinone  0.125 mcg/kg/min (12/11/23 0800)   norepinephrine  (LEVOPHED ) Adult infusion 6 mcg/min (12/11/23 0800)   potassium chloride  10 mEq (12/11/23 0934)   PRN Medications: acetaminophen  **OR** acetaminophen , docusate sodium , ipratropium-albuterol , mouth rinse, polyethylene glycol  Patient Profile   78 y.o. male with history of CAD, HFpEF with RV failure, DM, HTN, HLD, OSA and chronic respiratory failure on BiPAP, CHB s/p  dual chamber PPM with LBBAP lead in 01/25, hx asbestos exposure with pleural plaque on prior HRCT but no ILD, remote tobacco use.   Admitted with acute on chronic HFpEF with RV failure.  Assessment/Plan   Acute on chronic HFpEF with RV failure >> cardiogenic shock Pulmonary hypertension - Evidence of RV dysfunction on echo dating back to 2019.  - L/RHC in 02/2018: Nonobstructive CAD involving RCA, PCWP mean 8, PA 46/16 (26), Fick CO/CI 4.45/2.07, TD CO/CI 3.64/1.69.  - Echo with LVEF 60-65%, interventricular septum is flattened, RV enlarged and HK, RVSP 58 mmHg, moderate TR - RHC 12/09/23: RA 16, PA mean 34, PCWP 11, TD CO/CI 3.8/1.8, unable to read Fick, PVR 6.0, PAPi 1.4 - Most likely WHO group III pulmonary hypertension in setting of longstanding respiratory failure/OSA. Reports adherence with home BiPAP. However, PCO2 >100 here. - PFTs in 2020 with mixed restrictive and obstructive defect. CT w/o evidence of ILD. - Appears end stage. Remains on milrinone  and NE. Doses being titrated down by CCM  - Volume improved with IV lasix .. being held to day. Still has some volume on board. Restart as able - Place TED hose - Given severe CO2 retention suspect most WHO Group 3 disease but may have component of WHO Group 1. Consider careful trial of PDE5i if/when improved  2. AKI on CKD IIIa - Scr baseline 1.3-1.4, peaked 2.5 -> 1.9 - In setting of low-output/end stage PAH/cor pulmonale - continue inotrope suppor   3. Acute on chronic Hypercapneic respiratory failure - pCO2 climbing again. - may need re-institution of bipap  4. LE cellulitis cellulitis - Abx per primary team    5. CHB s/p dual chamber PPM w/ LBBAP lead Possible atrial fibrillation - Device interrogated by [employer] rep. Several very brief runs of atrial flutter noted, just seconds in duration.  - SR with frequent PACs    6. Hyperbilirubinemia - T bili up to 3 -> 1.0  - RUQ US  w/o cirrhosis - improved with RV  unloading  7. 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 PVCs/NSVT - keep Mg > 2.0 K > 4.0  GOC: Remains full  code -Overall poor prognosis due to hypercapneic resp failure and PAH/corpulmonale -Palliative Care consulted as above. Family currently wanting full care - Continue discussions   AHF team will see again Monday. Call over the weekend with questions.   CRITICAL CARE Performed by: Cherrie Sieving  Total critical care time: 40 minutes  Critical care time was exclusive of separately billable procedures and treating other patients.  Critical care was necessary to treat or prevent imminent or life-threatening deterioration.  Critical care was time spent personally by me (independent of midlevel providers or residents) on the following activities: development of treatment plan with patient and/or surrogate as well as nursing, discussions with consultants, evaluation of patient's response to treatment, examination of patient, obtaining history from patient or surrogate, ordering and performing treatments and interventions, ordering and review of laboratory studies, ordering and review of radiographic studies, pulse oximetry and re-evaluation of patient's condition.   Length of Stay: 4  Sieving Cherrie, MD  12/11/2023, 10:31 AM  Advanced Heart Failure Team Pager [phone] (M-F; 7a - 5p)  Please contact [facility] for night-coverage after hours (5p -7a ) and weekends on [email or web address]

## 2023-12-11 NOTE — Progress Notes (Signed)
 NAME:  Robert Ramirez, MRN:  [id number], DOB:  February 07, 1946, LOS: 4 ADMISSION DATE:  12/07/2023, CONSULTATION DATE:  7/16 REFERRING MD:  Dr. Cherrie, AHF CHIEF COMPLAINT:  RV failure, respiratory failure   History of Present Illness:  78 year old male with past medical history of diabetes, hypercholesterolemia, hypertension, CAD, HFpEF with RV failure, complete heart block s/p dual chamber PPM 05/2023, OSA and chronic respiratory failure on BiPAP at home, asbestos exposure with pleural plaque without ILD who presented to the emergency department on 12/07/23 with complaint of weakness for 2 weeks. Noted increased LE edema, EMS noting O2 sat ~92% requiring supplemental O2. In ED, afebrile, soft BP ~90s systolic, BNP 1195, WBC 18, sCr 1.8. Given Ancef  for concern of lower extremity cellulitis and was admitted to hospitalist. TRH started patient on BID Lasix  and repeated his echo demonstrating LVEF 60-65%, RV enlarged, estimated RVSP of 58. 7/15 early AM had soft BP. TRH gave patient 250cc LR bolus. Improved pressure which drifted back down so given additional 500cc bolus then 250 again for total of 1L IVF. 7/15 morning, AHF was consulted with worsened RV failure from previous admission, and recommended RHC, started on midodrine . 7/16, drowsy and disoriented, SOB and sCr increased with diuresis. Went for RHC and had ABG performed showing pCO2 >100. CCM was consulted to cath lab and patient was placed on BiPAP. AHF requesting ICU bed and CCM consult.   Pertinent  Medical History  History of diabetes, hypercholesterolemia, hypertension, CAD, HFpEF with RV failure, complete heart block s/p dual chamber PPM 05/2023, OSA and chronic respiratory failure on BiPAP at home, asbestos exposure with pleural plaque without ILD   Significant Hospital Events: Including procedures, antibiotic start and stop dates in addition to other pertinent events   7/14: admit for heart failure, started on antibiotics for cellulitis  coverage; echo with worsened RV failure  7/15: soft BP, given 1L IVF with initial improvement. Started on midodrine  and AHF consult  7/16: drowsy, disoriented, pCO2 high in cath lab, started on BiPAP. AHF request for CCM involvement and transfer to ICU.   Interim History / Subjective:  Milrinone  dose decreased and NE requirement coming down, delirious  Objective    Blood pressure 99/76, pulse 75, temperature (!) 97.1 F (36.2 C), temperature source Axillary, resp. rate 10, height 6' 1 (1.854 m), weight 81 kg, SpO2 100%. CVP:  [8 mmHg] 8 mmHg  FiO2 (%):  [40 %] 40 % PEEP:  [6 cmH20] 6 cmH20   Intake/Output Summary (Last 24 hours) at 12/11/2023 0804 Last data filed at 12/11/2023 0300 Gross per 24 hour  Intake 1515 ml  Output 3850 ml  Net -2335 ml   Filed Weights   12/09/23 1414 12/10/23 0500 12/11/23 0500  Weight: 88.8 kg 85.1 kg 81 kg   Examination: General: older male, acute on chronically ill appearing  HENT: perrla, ncat, large skin lesion to the right posterior scalp and neck, [place]  Lungs: diminished bilaterally, [place]  Cardiovascular: s1s2, cannot appreciate murmur  Abdomen: soft, hypoactive bowel sounds  Extremities: bilateral lower extremity 1+ pitting edema, BLE ruddy erythema  Neuro: Aox1, intermittent drowsiness, nonfocal GU: external UC  Resolved problem list   Assessment and Plan   Acute on chronic hypercarbic respiratory failure: Unclear inciting event.  Improved on BiPAP.  Poss related to volume overload although chest x-ray looks pretty stable. -- BiPAP PRN, [place] O2 sat goal 88% -- Lasix  holiday   Acute kidney injury: Presumed cardiorenal with elevated right atrial pressure. -- s/p aggressive  diuresis, UOP responding well to lasix  - holiday today with Cl in 80s -- Poor dialysis candidate   Pulmonary hypertension, RV failure: Cardiac index less than 2.  Based on catheterization, it is precapillary.  Group 1 disease possible, group 3 disease although parenchymal  process is pretty minimal. -- No evidence of ILD on previous high-res CT, he does have some restriction related to pleural plaques, extraparenchymal restriction -- TTE indicates normal left atrial size, no significant valvular dysfunctions, presumably Group 1 disease although given he is a male and elderly age idiopathic would be unusual, would send serologies for connective tissue as outpatient -- Diuresis as above, consider addition of PDE 5 inhibitor if shows improvement, and if tolerates additional oral agents, he is a poor candidate for IV prostacyclin given his multifactorial disease, advanced age, kidney dysfunction, concern for skin malignancy although this is been present for many years so may not be malignant -- Appreciate palliative care assistance   Hypotension: SVR in cath lab ~1000, worsened since milrinone , suspect reduction in SVR due to milrinone . LA WNL - not shock. No infectious source other than cellulitis - aggressively treated and more likely chronic venous stasis changes . -- Norepinephrine  to aid in blood pressure -- Wean milrinone    Lower extremity cellulitis: vs venous stasis -- Continue antibiotics x 5 days   Metabolic encephalopathy:  --now with delirium, Zyprexa  at bedtime added 7/18   Diabetes: SSI   Skin lesion: Present since last 2019 concern for malignancy based on results of MRI in 2019.  Has not followed up on this. -- Outpatient follow-up if survives  Best Practice (right click and Reselect all SmartList Selections daily)   Diet/type: NPO DVT prophylaxis: LMWH GI prophylaxis: N/A Lines: Central line, Arterial Line, and yes and it is still needed Foley:  N/A Code Status:  full code Last date of multidisciplinary goals of care discussion [pending]  Labs   CBC: Recent Labs  Lab 12/07/23 0801 12/08/23 0401 12/09/23 0242 12/09/23 1255 12/10/23 0911 12/10/23 1116  WBC 18.2* 16.0* 14.8*  --  15.8*  --   NEUTROABS 16.1*  --  12.8*  --   --   --    HGB 16.4 14.3 14.1 16.3 14.4 15.0  HCT 51.1 46.4 46.0 48.0 45.4 44.0  MCV 93.1 94.7 94.5  --  92.7  --   PLT 165 163 192  --  221  --    Basic Metabolic Panel: Recent Labs  Lab 12/07/23 0801 12/08/23 0401 12/09/23 0242 12/09/23 1255 12/10/23 0020 12/10/23 1116 12/11/23 0350  NA 134* 138 134* 133* 133* 132* 135  K 4.6 3.9 4.3 4.6 3.6 3.8 3.1*  CL 91* 96* 91*  --  92*  --  87*  CO2 32 33* 33*  --  29  --  35*  GLUCOSE 279* 113* 221*  --  196*  --  140*  BUN 32* 39* 60*  --  67*  --  59*  CREATININE 1.80* 1.67* 2.33*  --  2.48*  --  1.93*  CALCIUM  9.9 9.8 9.7  --  9.5  --  10.2  MG  --   --  2.0  --  1.8  --  1.8   GFR: Estimated Creatinine Clearance: 36.8 mL/min (A) (by C-G formula based on SCr of 1.93 mg/dL (H)). Recent Labs  Lab 12/07/23 0801 12/08/23 0401 12/08/23 0714 12/09/23 0242 12/09/23 1636 12/09/23 2004 12/10/23 0911  WBC 18.2* 16.0*  --  14.8*  --   --  15.8*  LATICACIDVEN  --   --  0.9  --  0.7 0.7  --    Liver Function Tests: Recent Labs  Lab 12/07/23 0801 12/10/23 0020  AST 28 22  ALT 16 12  ALKPHOS 106 114  BILITOT 2.5* 1.0  PROT 6.1* 5.7*  ALBUMIN 3.0* 2.3*   No results for input(s): LIPASE, AMYLASE in the last 168 hours. Recent Labs  Lab 12/09/23 0934  AMMONIA 27   ABG    Component Value Date/Time   PHART 7.356 12/10/2023 1116   PCO2ART 60.5 (H) 12/10/2023 1116   PO2ART 63 (L) 12/10/2023 1116   HCO3 33.9 (H) 12/10/2023 1116   TCO2 36 (H) 12/10/2023 1116   O2SAT 84.1 12/10/2023 1330    Coagulation Profile: No results for input(s): INR, PROTIME in the last 168 hours.  Cardiac Enzymes: No results for input(s): CKTOTAL, CKMB, CKMBINDEX, TROPONINI in the last 168 hours.  HbA1C: Hemoglobin A1C  Date/Time Value Ref Range Status  09/30/2023 11:18 AM 8.0 (A) 4.0 - 5.6 % Final  08/04/2022 02:50 PM 7.6 (A) 4.0 - 5.6 % Final   HbA1c, POC (controlled diabetic range)  Date/Time Value Ref Range Status  03/15/2021 10:01  AM 8.3 (A) 0.0 - 7.0 % Final   Hgb A1c MFr Bld  Date/Time Value Ref Range Status  06/12/2023 06:14 PM 7.5 (H) 4.8 - 5.6 % Final    Comment:    (NOTE) Pre diabetes:          5.7%-6.4%  Diabetes:              >6.4%  Glycemic control for   <7.0% adults with diabetes   01/28/2022 02:00 PM 8.2 (H) 4.8 - 5.6 % Final    Comment:             Prediabetes: 5.7 - 6.4          Diabetes: >6.4          Glycemic control for adults with diabetes: <7.0    CBG: Recent Labs  Lab 12/10/23 1600 12/10/23 1940 12/11/23 0007 12/11/23 0308 12/11/23 0719  GLUCAP 227* 199* 202* 135* 148*   Review of Systems:   Unable to obtain due to encephalopathy  Past Medical History:  He,  has a past medical history of Diabetes mellitus, High cholesterol, and Hypertension.   Surgical History:   Past Surgical History:  Procedure Laterality Date   APPENDECTOMY     ARTERIAL LINE INSERTION N/A 12/09/2023   Procedure: ARTERIAL LINE INSERTION;  Surgeon: Cherrie Toribio SAUNDERS, MD;  Location: [facility]ASIVE [facility];  Service: Cardiovascular;  Laterality: N/A;   CENTRAL LINE INSERTION  12/09/2023   Procedure: CENTRAL LINE INSERTION;  Surgeon: Cherrie Toribio SAUNDERS, MD;  Location: [facility];  Service: Cardiovascular;;   PACEMAKER IMPLANT N/A 06/12/2023   Procedure: PACEMAKER IMPLANT;  Surgeon: Kennyth Chew, MD;  [facility];  Service: Cardiovascular;  Laterality: N/A;   RIGHT HEART CATH N/A 12/09/2023   Procedure: RIGHT HEART CATH;  Surgeon: Cherrie Toribio SAUNDERS, MD;  Location: [facility]VAS[facility];  Service: Cardiovascular;  Laterality: N/A;   RIGHT/LEFT HEART CATH AND CORONARY ANGIOGRAPHY N/A 03/17/2018   Procedure: RIGHT/LEFT HEART CATH AND CORONARY ANGIOGRAPHY;  Surgeon: Claudene Pacific, MD;  Location: [facility];  Service: Cardiovascular;  Laterality: N/A;    Social History:   reports that he quit smoking about 43 years ago. His smoking use included cigarettes. He has never been exposed  to  tobacco smoke. His smokeless tobacco use includes chew and snuff. He reports that he does not drink alcohol and does not use drugs.   Family History:  His family history includes Cancer in his father and sister; Diabetes in his sister; Heart failure in his mother.   Allergies No Known Allergies   Home Medications  Prior to Admission medications   Medication Sig Start Date End Date Taking? Authorizing Provider  amLODipine  (NORVASC ) 10 MG tablet Take 1 tablet (10 mg total) by mouth daily. 08/04/22  Yes Lorren, Amy J, NP  aspirin  EC 81 MG tablet Take 81 mg by mouth daily. Swallow whole.   Yes [provider]  atorvastatin  (LIPITOR) 40 MG tablet Take 40 mg by mouth daily.   Yes [provider]  chlorthalidone  (HYGROTON ) 25 MG tablet TAKE 1 TABLET EVERY DAY 10/16/22  Yes Thukkani, Arun K, MD  Cholecalciferol (VITAMIN D-3) 25 MCG (1000 UT) CAPS Take 1 capsule by mouth every morning.   Yes [provider]  empagliflozin  (JARDIANCE ) 10 MG TABS tablet Take 1 tablet (10 mg total) by mouth daily before breakfast. 05/07/23  Yes Thukkani, Arun K, MD  ezetimibe  (ZETIA ) 10 MG tablet Take 1 tablet (10 mg total) by mouth daily. 11/05/23  Yes Thukkani, Arun K, MD  Insulin  NPH, Human,, Isophane, (HUMULIN  N KWIKPEN) 100 UNIT/ML Kiwkpen Inject 33 Units into the skin in the morning and at bedtime. 10/02/23  Yes Newlin, Enobong, MD  levothyroxine  (SYNTHROID ) 50 MCG tablet Take 1 tablet (50 mcg total) by mouth daily. 09/30/23 12/29/23 Yes Lorren, Amy J, NP  lisinopril  (ZESTRIL ) 40 MG tablet TAKE 1 TABLET EVERY DAY 10/16/22  Yes Thukkani, Arun K, MD  metFORMIN  (GLUCOPHAGE ) 1000 MG tablet Take 1 tablet (1,000 mg total) by mouth 2 (two) times daily with a meal. 09/30/23 12/29/23 Yes Lorren, Amy J, NP  nitroGLYCERIN  (NITROSTAT ) 0.4 MG SL tablet Place 1 tablet (0.4 mg total) under the tongue every 5 (five) minutes as needed for chest pain. 03/29/21  Yes Thukkani, Arun K, MD  spironolactone  (ALDACTONE )  25 MG tablet TAKE 1 TABLET EVERY DAY Patient taking differently: Take 12.5 mg by mouth daily. 03/12/23  Yes Thukkani, Arun K, MD  vitamin B-12 (CYANOCOBALAMIN ) 500 MCG tablet Take 500 mcg by mouth daily.   Yes [provider]  blood glucose meter kit and supplies Dispense based on patient and insurance preference. Use up to four times daily as directed. (FOR ICD-10 E10.9, E11.9). 02/08/22   Lorren Greig PARAS, NP  Insulin  Pen Needle (DROPLET PEN NEEDLES) 31G X 8 MM MISC 1 each by Other route 2 (two) times daily with a meal. 08/04/22   Lorren Greig PARAS, NP    Critical care time:    CRITICAL CARE Performed by: Donnice JONELLE Beals   Total critical care time: 31 minutes  Critical care time was exclusive of separately billable procedures and treating other patients.  Critical care was necessary to treat or prevent imminent or life-threatening deterioration.  Critical care was time spent personally by me on the following activities: development of treatment plan with patient and/or surrogate as well as nursing, discussions with consultants, evaluation of patient's response to treatment, examination of patient, obtaining history from patient or surrogate, ordering and performing treatments and interventions, ordering and review of laboratory studies, ordering and review of radiographic studies, pulse oximetry and re-evaluation of patient's condition.    Donnice JONELLE Beals, MD See TRACEY

## 2023-12-11 NOTE — Plan of Care (Signed)
  Problem: Coping: Goal: Ability to adjust to condition or change in health will improve Outcome: Progressing   Problem: Fluid Volume: Goal: Ability to maintain a balanced intake and output will improve Outcome: Progressing   Problem: Nutritional: Goal: Maintenance of adequate nutrition will improve Outcome: Progressing

## 2023-12-12 DIAGNOSIS — N179 Acute kidney failure, unspecified: Secondary | ICD-10-CM | POA: Diagnosis not present

## 2023-12-12 DIAGNOSIS — I5033 Acute on chronic diastolic (congestive) heart failure: Secondary | ICD-10-CM | POA: Diagnosis not present

## 2023-12-12 DIAGNOSIS — Z515 Encounter for palliative care: Secondary | ICD-10-CM | POA: Diagnosis not present

## 2023-12-12 DIAGNOSIS — N189 Chronic kidney disease, unspecified: Secondary | ICD-10-CM | POA: Diagnosis not present

## 2023-12-12 DIAGNOSIS — J9622 Acute and chronic respiratory failure with hypercapnia: Secondary | ICD-10-CM | POA: Diagnosis not present

## 2023-12-12 DIAGNOSIS — Z7189 Other specified counseling: Secondary | ICD-10-CM

## 2023-12-12 DIAGNOSIS — I272 Pulmonary hypertension, unspecified: Secondary | ICD-10-CM | POA: Diagnosis not present

## 2023-12-12 LAB — BASIC METABOLIC PANEL WITH GFR
Anion gap: 15 (ref 5–15)
BUN: 59 mg/dL — ABNORMAL HIGH (ref 8–23)
CO2: 38 mmol/L — ABNORMAL HIGH (ref 22–32)
Calcium: 9.7 mg/dL (ref 8.9–10.3)
Chloride: 83 mmol/L — ABNORMAL LOW (ref 98–111)
Creatinine, Ser: 1.57 mg/dL — ABNORMAL HIGH (ref 0.61–1.24)
GFR, Estimated: 45 mL/min — ABNORMAL LOW (ref >60)
Glucose, Bld: 221 mg/dL — ABNORMAL HIGH (ref 70–99)
Potassium: 3.6 mmol/L (ref 3.5–5.1)
Sodium: 136 mmol/L (ref 135–145)

## 2023-12-12 LAB — GLUCOSE, CAPILLARY
Glucose-Capillary: 157 mg/dL — ABNORMAL HIGH (ref 70–99)
Glucose-Capillary: 218 mg/dL — ABNORMAL HIGH (ref 70–99)
Glucose-Capillary: 269 mg/dL — ABNORMAL HIGH (ref 70–99)
Glucose-Capillary: 218 mg/dL — ABNORMAL HIGH (ref 70–99)
Glucose-Capillary: 181 mg/dL — ABNORMAL HIGH (ref 70–99)
Glucose-Capillary: 195 mg/dL — ABNORMAL HIGH (ref 70–99)

## 2023-12-12 LAB — MAGNESIUM: Magnesium: 2.3 mg/dL (ref 1.7–2.4)

## 2023-12-12 LAB — PHOSPHORUS: Phosphorus: 3.1 mg/dL (ref 2.5–4.6)

## 2023-12-12 MED ORDER — ZINC OXIDE 40 % EX OINT
TOPICAL_OINTMENT | Freq: Two times a day (BID) | CUTANEOUS | Status: DC
  Administered 2023-12-13: 1 via TOPICAL
  Filled 2023-12-12 (×2): qty 57

## 2023-12-12 MED ORDER — ATORVASTATIN CALCIUM 40 MG PO TABS
40.0000 mg | ORAL_TABLET | Freq: Every day | ORAL | Status: DC
  Administered 2023-12-12 – 2023-12-14 (×3): 40 mg
  Filled 2023-12-12 (×3): qty 1

## 2023-12-12 MED ORDER — EZETIMIBE 10 MG PO TABS
10.0000 mg | ORAL_TABLET | Freq: Every day | ORAL | Status: DC
  Administered 2023-12-12 – 2023-12-14 (×3): 10 mg
  Filled 2023-12-12 (×3): qty 1

## 2023-12-12 MED ORDER — INSULIN ASPART 100 UNIT/ML IJ SOLN
0.0000 [IU] | INTRAMUSCULAR | Status: DC
  Administered 2023-12-12: 11 [IU] via SUBCUTANEOUS
  Administered 2023-12-12: 4 [IU] via SUBCUTANEOUS
  Administered 2023-12-12: 7 [IU] via SUBCUTANEOUS
  Administered 2023-12-12 – 2023-12-13 (×3): 4 [IU] via SUBCUTANEOUS
  Administered 2023-12-13 – 2023-12-14 (×4): 7 [IU] via SUBCUTANEOUS
  Administered 2023-12-14: 4 [IU] via SUBCUTANEOUS
  Administered 2023-12-14 (×2): 7 [IU] via SUBCUTANEOUS
  Administered 2023-12-14: 11 [IU] via SUBCUTANEOUS
  Administered 2023-12-14 (×2): 4 [IU] via SUBCUTANEOUS
  Administered 2023-12-15 (×2): 11 [IU] via SUBCUTANEOUS
  Administered 2023-12-15: 7 [IU] via SUBCUTANEOUS
  Administered 2023-12-15 (×2): 4 [IU] via SUBCUTANEOUS
  Administered 2023-12-15 – 2023-12-16 (×2): 11 [IU] via SUBCUTANEOUS
  Administered 2023-12-16 (×2): 7 [IU] via SUBCUTANEOUS

## 2023-12-12 MED ORDER — POLYETHYLENE GLYCOL 3350 17 G PO PACK: 17.0000 g | PACK | Freq: Every day | ORAL | Status: DC | PRN

## 2023-12-12 MED ORDER — INSULIN GLARGINE-YFGN 100 UNIT/ML ~~LOC~~ SOLN
10.0000 [IU] | Freq: Every day | SUBCUTANEOUS | Status: DC
  Administered 2023-12-12 – 2023-12-14 (×3): 10 [IU] via SUBCUTANEOUS
  Filled 2023-12-12 (×3): qty 0.1

## 2023-12-12 MED ORDER — DOCUSATE SODIUM 50 MG/5ML PO LIQD: 100.0000 mg | Freq: Two times a day (BID) | ORAL | Status: DC | PRN

## 2023-12-12 MED ORDER — LEVOTHYROXINE SODIUM 25 MCG PO TABS
50.0000 ug | ORAL_TABLET | Freq: Every day | ORAL | Status: DC
  Administered 2023-12-13 – 2023-12-14 (×2): 50 ug
  Filled 2023-12-12 (×2): qty 2

## 2023-12-12 MED ORDER — ASPIRIN 81 MG PO CHEW
81.0000 mg | CHEWABLE_TABLET | Freq: Every day | ORAL | Status: DC
  Administered 2023-12-12 – 2023-12-14 (×3): 81 mg
  Filled 2023-12-12 (×3): qty 1

## 2023-12-12 NOTE — Progress Notes (Signed)
 NAME:  Robert Ramirez, MRN:  [id number], DOB:  09-23-1945, LOS: 5 ADMISSION DATE:  12/07/2023, CONSULTATION DATE:  7/16 REFERRING MD:  Dr. Cherrie, AHF CHIEF COMPLAINT:  RV failure, respiratory failure   History of Present Illness:  78 year old male with past medical history of diabetes, hypercholesterolemia, hypertension, CAD, HFpEF with RV failure, complete heart block s/p dual chamber PPM 05/2023, OSA and chronic respiratory failure on BiPAP at home, asbestos exposure with pleural plaque without ILD who presented to the emergency department on 12/07/23 with complaint of weakness for 2 weeks. Noted increased LE edema, EMS noting O2 sat ~92% requiring supplemental O2. In ED, afebrile, soft BP ~90s systolic, BNP 1195, WBC 18, sCr 1.8. Given Ancef  for concern of lower extremity cellulitis and was admitted to hospitalist. TRH started patient on BID Lasix  and repeated his echo demonstrating LVEF 60-65%, RV enlarged, estimated RVSP of 58. 7/15 early AM had soft BP. TRH gave patient 250cc LR bolus. Improved pressure which drifted back down so given additional 500cc bolus then 250 again for total of 1L IVF. 7/15 morning, AHF was consulted with worsened RV failure from previous admission, and recommended RHC, started on midodrine . 7/16, drowsy and disoriented, SOB and sCr increased with diuresis. Went for RHC and had ABG performed showing pCO2 >100. CCM was consulted to cath lab and patient was placed on BiPAP. AHF requesting ICU bed and CCM consult.   Pertinent  Medical History  History of diabetes, hypercholesterolemia, hypertension, CAD, HFpEF with RV failure, complete heart block s/p dual chamber PPM 05/2023, OSA and chronic respiratory failure on BiPAP at home, asbestos exposure with pleural plaque without ILD   Significant Hospital Events: Including procedures, antibiotic start and stop dates in addition to other pertinent events   7/14: admit for heart failure, started on antibiotics for cellulitis  coverage; echo with worsened RV failure  7/15: soft BP, given 1L IVF with initial improvement. Started on midodrine  and AHF consult  7/16: drowsy, disoriented, pCO2 high in cath lab, started on BiPAP. AHF request for CCM involvement and transfer to ICU.   Interim History / Subjective:  Milrinone  discontinued, off NE, UOP good while holding lasix , Cr better  Objective    Blood pressure 99/76, pulse 81, temperature 98.5 F (36.9 C), temperature source Axillary, resp. rate 16, height 6' 1 (1.854 m), weight 81 kg, SpO2 100%.    Vent Mode: PCV FiO2 (%):  [40 %] 40 % Set Rate:  [12 bmp] 12 bmp PEEP:  [6 cmH20] 6 cmH20   Intake/Output Summary (Last 24 hours) at 12/12/2023 0731 Last data filed at 12/12/2023 0600 Gross per 24 hour  Intake 1135.82 ml  Output 1875 ml  Net -739.18 ml   Filed Weights   12/09/23 1414 12/10/23 0500 12/11/23 0500  Weight: 88.8 kg 85.1 kg 81 kg   Examination: General: older male, acute on chronically ill appearing  HENT: perrla, ncat, large skin lesion to the right posterior scalp and neck, [place]  Lungs: diminished bilaterally, [place]  Cardiovascular: s1s2, cannot appreciate murmur  Abdomen: soft, hypoactive bowel sounds  Extremities: bilateral lower extremity trace pitting edema, BLE ruddy erythema  Neuro: Aox1, intermittent drowsiness, nonfocal GU: external UC  Resolved problem list   Assessment and Plan   Acute on chronic hypercarbic respiratory failure: Unclear inciting event.  Improved on BiPAP.  Poss related to volume overload although chest x-ray looks pretty stable. -- BiPAP at bedtime (uses at home), [place] O2 sat goal 88% -- Lasix  holiday   Acute kidney  injury: Presumed cardiorenal with elevated right atrial pressure. -- s/p aggressive diuresis, UOP responded well to lasix , Cr improving  -- Poor dialysis candidate   Pulmonary hypertension, RV failure: Cardiac index less than 2.  Based on catheterization, it is precapillary.  Group 1 disease possible,  group 3 disease although parenchymal process is pretty minimal. -- No evidence of ILD on previous high-res CT, he does have some restriction related to pleural plaques, extraparenchymal restriction -- TTE indicates normal left atrial size, no significant valvular dysfunctions, presumably Group 1 disease although given he is a male and elderly age idiopathic would be unusual, would send serologies for connective tissue as outpatient -- Diuresis as above, consider addition of PDE 5 inhibitor if shows improvement, and if tolerates additional oral agents, he is a poor candidate for IV prostacyclin given his multifactorial disease, advanced age, kidney dysfunction, concern for skin malignancy although this is been present for many years so may not be malignant -- Appreciate palliative care assistance   Hypotension: SVR in cath lab ~1000, worsened since milrinone , suspect reduction in SVR due to milrinone . LA WNL - not shock. No infectious source other than cellulitis - aggressively treated and more likely chronic venous stasis changes . -- Norepinephrine  to aid in blood pressure -- Wean milrinone   Afib:  --Amio --Consider starting AC   Lower extremity cellulitis: vs venous stasis -- Continue antibiotics x 7 days   Metabolic encephalopathy:  --now with delirium, Zyprexa  d/c'd now on amio   Diabetes: Hyperglycemia with TF starting --10 units glargine, SSI   Skin lesion: Present since last 2019 concern for malignancy based on results of MRI in 2019.  Has not followed up on this. -- Outpatient follow-up if survives  Best Practice (right click and Reselect all SmartList Selections daily)   Diet/type: NPO DVT prophylaxis: LMWH GI prophylaxis: N/A Lines: Central line, Arterial Line, and yes and it is still needed Foley:  N/A Code Status:  full code Last date of multidisciplinary goals of care discussion [pending]  Labs   CBC: Recent Labs  Lab 12/07/23 0801 12/08/23 0401 12/09/23 0242  12/09/23 1255 12/10/23 0911 12/10/23 1116 12/11/23 0722 12/11/23 1017  WBC 18.2* 16.0* 14.8*  --  15.8*  --  9.4  --   NEUTROABS 16.1*  --  12.8*  --   --   --   --   --   HGB 16.4 14.3 14.1 16.3 14.4 15.0 14.0 15.6  HCT 51.1 46.4 46.0 48.0 45.4 44.0 43.5 46.0  MCV 93.1 94.7 94.5  --  92.7  --  90.4  --   PLT 165 163 192  --  221  --  186  --    Basic Metabolic Panel: Recent Labs  Lab 12/07/23 0801 12/08/23 0401 12/09/23 0242 12/09/23 1255 12/10/23 0020 12/10/23 1116 12/11/23 0350 12/11/23 1017  NA 134* 138 134* 133* 133* 132* 135 134*  K 4.6 3.9 4.3 4.6 3.6 3.8 3.1* 3.4*  CL 91* 96* 91*  --  92*  --  87*  --   CO2 32 33* 33*  --  29  --  35*  --   GLUCOSE 279* 113* 221*  --  196*  --  140*  --   BUN 32* 39* 60*  --  67*  --  59*  --   CREATININE 1.80* 1.67* 2.33*  --  2.48*  --  1.93*  --   CALCIUM  9.9 9.8 9.7  --  9.5  --  10.2  --  MG  --   --  2.0  --  1.8  --  1.8  --   PHOS  --   --   --   --   --   --  1.8*  --    GFR: Estimated Creatinine Clearance: 36.8 mL/min (A) (by C-G formula based on SCr of 1.93 mg/dL (H)). Recent Labs  Lab 12/08/23 0401 12/08/23 0714 12/09/23 0242 12/09/23 1636 12/09/23 2004 12/10/23 0911 12/11/23 0722  WBC 16.0*  --  14.8*  --   --  15.8* 9.4  LATICACIDVEN  --  0.9  --  0.7 0.7  --   --    Liver Function Tests: Recent Labs  Lab 12/07/23 0801 12/10/23 0020  AST 28 22  ALT 16 12  ALKPHOS 106 114  BILITOT 2.5* 1.0  PROT 6.1* 5.7*  ALBUMIN 3.0* 2.3*   No results for input(s): LIPASE, AMYLASE in the last 168 hours. Recent Labs  Lab 12/09/23 0934  AMMONIA 27   ABG    Component Value Date/Time   PHART 7.345 (L) 12/11/2023 1017   PCO2ART 76.8 (HH) 12/11/2023 1017   PO2ART 100 12/11/2023 1017   HCO3 42.3 (H) 12/11/2023 1017   TCO2 45 (H) 12/11/2023 1017   O2SAT 97 12/11/2023 1017    Coagulation Profile: No results for input(s): INR, PROTIME in the last 168 hours.  Cardiac Enzymes: No results for  input(s): CKTOTAL, CKMB, CKMBINDEX, TROPONINI in the last 168 hours.  HbA1C: Hemoglobin A1C  Date/Time Value Ref Range Status  09/30/2023 11:18 AM 8.0 (A) 4.0 - 5.6 % Final  08/04/2022 02:50 PM 7.6 (A) 4.0 - 5.6 % Final   HbA1c, POC (controlled diabetic range)  Date/Time Value Ref Range Status  03/15/2021 10:01 AM 8.3 (A) 0.0 - 7.0 % Final   Hgb A1c MFr Bld  Date/Time Value Ref Range Status  06/12/2023 06:14 PM 7.5 (H) 4.8 - 5.6 % Final    Comment:    (NOTE) Pre diabetes:          5.7%-6.4%  Diabetes:              >6.4%  Glycemic control for   <7.0% adults with diabetes   01/28/2022 02:00 PM 8.2 (H) 4.8 - 5.6 % Final    Comment:             Prediabetes: 5.7 - 6.4          Diabetes: >6.4          Glycemic control for adults with diabetes: <7.0    CBG: Recent Labs  Lab 12/11/23 1504 12/11/23 1919 12/11/23 2305 12/12/23 0307 12/12/23 0711  GLUCAP 123* 173* 115* 157* 218*   Review of Systems:   Unable to obtain due to encephalopathy  Past Medical History:  He,  has a past medical history of Diabetes mellitus, High cholesterol, and Hypertension.   Surgical History:   Past Surgical History:  Procedure Laterality Date   APPENDECTOMY     ARTERIAL LINE INSERTION N/A 12/09/2023   Procedure: ARTERIAL LINE INSERTION;  Surgeon: Cherrie Toribio SAUNDERS, MD;  Location: [facility] IN[facility]AS[facility];  Service: Cardiovascular;  Laterality: N/A;   CENTRAL LINE INSERTION  12/09/2023   Procedure: CENTRAL LINE INSERTION;  Surgeon: Cherrie Toribio SAUNDERS, MD;  Location: [facility]VAS[facility] CV [facility];  Service: Cardiovascular;;   PACEMAKER IMPLANT N/A 06/12/2023   Procedure: PACEMAKER IMPLANT;  Surgeon: Kennyth Chew, MD;  [facility] INVASIVE CV LAB;  Service: Cardiovascular;  Laterality: N/A;  RIGHT HEART CATH N/A 12/09/2023   Procedure: RIGHT HEART CATH;  Surgeon: Cherrie Toribio SAUNDERS, MD;  Location: [facility] INVASIVE CV LAB;  Service: Cardiovascular;  Laterality: N/A;   RIGHT/LEFT HEART CATH AND CORONARY  ANGIOGRAPHY N/A 03/17/2018   Procedure: RIGHT/LEFT HEART CATH AND CORONARY ANGIOGRAPHY;  Surgeon: Claudene Pacific, MD;  Location: [facility] INVASIVE CV LAB;  Service: Cardiovascular;  Laterality: N/A;    Social History:   reports that he quit smoking about 43 years ago. His smoking use included cigarettes. He has never been exposed to tobacco smoke. His smokeless tobacco use includes chew and snuff. He reports that he does not drink alcohol and does not use drugs.   Family History:  His family history includes Cancer in his father and sister; Diabetes in his sister; Heart failure in his mother.   Allergies No Known Allergies   Home Medications  Prior to Admission medications   Medication Sig Start Date End Date Taking? Authorizing Provider  amLODipine  (NORVASC ) 10 MG tablet Take 1 tablet (10 mg total) by mouth daily. 08/04/22  Yes Lorren, Amy J, NP  aspirin  EC 81 MG tablet Take 81 mg by mouth daily. Swallow whole.   Yes [provider]  atorvastatin  (LIPITOR) 40 MG tablet Take 40 mg by mouth daily.   Yes [provider]  chlorthalidone  (HYGROTON ) 25 MG tablet TAKE 1 TABLET EVERY DAY 10/16/22  Yes Thukkani, Arun K, MD  Cholecalciferol (VITAMIN D-3) 25 MCG (1000 UT) CAPS Take 1 capsule by mouth every morning.   Yes [provider]  empagliflozin  (JARDIANCE ) 10 MG TABS tablet Take 1 tablet (10 mg total) by mouth daily before breakfast. 05/07/23  Yes Thukkani, Arun K, MD  ezetimibe  (ZETIA ) 10 MG tablet Take 1 tablet (10 mg total) by mouth daily. 11/05/23  Yes Thukkani, Arun K, MD  Insulin  NPH, Human,, Isophane, (HUMULIN  N KWIKPEN) 100 UNIT/ML Kiwkpen Inject 33 Units into the skin in the morning and at bedtime. 10/02/23  Yes Newlin, Enobong, MD  levothyroxine  (SYNTHROID ) 50 MCG tablet Take 1 tablet (50 mcg total) by mouth daily. 09/30/23 12/29/23 Yes Lorren, Amy J, NP  lisinopril  (ZESTRIL ) 40 MG tablet TAKE 1 TABLET EVERY DAY 10/16/22  Yes Thukkani, Arun K, MD  metFORMIN  (GLUCOPHAGE )  1000 MG tablet Take 1 tablet (1,000 mg total) by mouth 2 (two) times daily with a meal. 09/30/23 12/29/23 Yes Lorren, Amy J, NP  nitroGLYCERIN  (NITROSTAT ) 0.4 MG SL tablet Place 1 tablet (0.4 mg total) under the tongue every 5 (five) minutes as needed for chest pain. 03/29/21  Yes Thukkani, Arun K, MD  spironolactone  (ALDACTONE ) 25 MG tablet TAKE 1 TABLET EVERY DAY Patient taking differently: Take 12.5 mg by mouth daily. 03/12/23  Yes Thukkani, Arun K, MD  vitamin B-12 (CYANOCOBALAMIN ) 500 MCG tablet Take 500 mcg by mouth daily.   Yes [provider]  blood glucose meter kit and supplies Dispense based on patient and insurance preference. Use up to four times daily as directed. (FOR ICD-10 E10.9, E11.9). 02/08/22   Lorren Greig PARAS, NP  Insulin  Pen Needle (DROPLET PEN NEEDLES) 31G X 8 MM MISC 1 each by Other route 2 (two) times daily with a meal. 08/04/22   Lorren Greig PARAS, NP    Critical care time:    CRITICAL CARE Performed by: Donnice SAUNDERS Beals   Total critical care time: 30 minutes  Critical care time was exclusive of separately billable procedures and treating other patients.  Critical care was necessary to treat or prevent  imminent or life-threatening deterioration.  Critical care was time spent personally by me on the following activities: development of treatment plan with patient and/or surrogate as well as nursing, discussions with consultants, evaluation of patient's response to treatment, examination of patient, obtaining history from patient or surrogate, ordering and performing treatments and interventions, ordering and review of laboratory studies, ordering and review of radiographic studies, pulse oximetry and re-evaluation of patient's condition.    Donnice JONELLE Beals, MD See TRACEY

## 2023-12-12 NOTE — Evaluation (Signed)
 Clinical/Bedside Swallow Evaluation Patient Details  Name: Robert Ramirez MRN: [id number] Date of Birth: 1945-08-29  Today's Date: 12/12/2023 Time: SLP Start Time (ACUTE ONLY): 1041 SLP Stop Time (ACUTE ONLY): 1053 SLP Time Calculation (min) (ACUTE ONLY): 12 min  Past Medical History:  Past Medical History:  Diagnosis Date   Diabetes mellitus    High cholesterol    Hypertension    Past Surgical History:  Past Surgical History:  Procedure Laterality Date   APPENDECTOMY     ARTERIAL LINE INSERTION N/A 12/09/2023   Procedure: ARTERIAL LINE INSERTION;  Surgeon: Cherrie Toribio SAUNDERS, MD;  Location: [facility]VAS[facility] CV [facility];  Service: Cardiovascular;  Laterality: N/A;   CENTRAL LINE INSERTION  12/09/2023   Procedure: CENTRAL LINE INSERTION;  Surgeon: Cherrie Toribio SAUNDERS, MD;  Location: [facility] INVASIVE CV LAB;  Service: Cardiovascular;;   PACEMAKER IMPLANT N/A 06/12/2023   Procedure: PACEMAKER IMPLANT;  Surgeon: Kennyth Chew, MD;  [facility] INVASIVE CV LAB;  Service: Cardiovascular;  Laterality: N/A;   RIGHT HEART CATH N/A 12/09/2023   Procedure: RIGHT HEART CATH;  Surgeon: Cherrie Toribio SAUNDERS, MD;  Location: [facility] INVASIVE CV [facility];  Service: Cardiovascular;  Laterality: N/A;   RIGHT/LEFT HEART CATH AND CORONARY ANGIOGRAPHY N/A 03/17/2018   Procedure: RIGHT/LEFT HEART CATH AND CORONARY ANGIOGRAPHY;  Surgeon: Claudene Pacific, MD;  Location: [facility] INVASIVE CV LAB;  Service: Cardiovascular;  Laterality: N/A;   HPI:  Robert Ramirez is a 78 y.o. M presenting to [facility] on 12/07/23 w/ leg weakness and swelling. Admitted for heart failure, pulmonary HTN, and cellulitis. Robert Ramirez was initially on heart healthy diet but was made NPO 7/16 for RHC and kept NPO due to AMS and respiratory failure. PMH is significant for HTN, HLD, HFpEF, heart block s/p PPM, and DMT2.    Assessment / Plan / Recommendation  Clinical Impression  Robert Ramirez's mentation and level of alertness are  barriers to PO intake at this time. He did not open his eyes but with Max  stimulation he did accept a few POs. Awareness is reduced and responses are slow but he does initiate a swallow consistently. There is coughing that follows thin liquids especially noted after purees had been introduced. His voice is only briefly heard but sounds dysphonic. Recommend that he stay NPO (has Cortrak) with a few single pieces of ice or spoonfuls of plain water  after oral care if he is fully alert. SLP will f/u for readiness to begin diet vs need for instrumental testing pending further improvements in mentation.   SLP Visit Diagnosis: Dysphagia, unspecified (R13.10)    Aspiration Risk       Diet Recommendation NPO (few single pieces of ice or spoonfuls of water  after oral care if fully alert)    Medication Administration: Via alternative means    Other  Recommendations Oral Care Recommendations: Oral care QID;Oral care prior to ice chip/H20 Caregiver Recommendations: Have oral suction available     Assistance Recommended at Discharge    Functional Status Assessment Patient has had a recent decline in their functional status and demonstrates the ability to make significant improvements in function in a reasonable and predictable amount of time.  Frequency and Duration min 2x/week  2 weeks       Prognosis Prognosis for improved oropharyngeal function: Good Barriers to Reach Goals: Cognitive deficits      Swallow Study   General HPI: Robert Ramirez is a 78 y.o. M presenting to [facility] on 12/07/23 w/ leg weakness and swelling. Admitted for heart failure, pulmonary HTN, and cellulitis.  Robert Ramirez was initially on heart healthy diet but was made NPO 7/16 for RHC and kept NPO due to AMS and respiratory failure. PMH is significant for HTN, HLD, HFpEF, heart block s/p PPM, and DMT2. Type of Study: Bedside Swallow Evaluation Previous Swallow Assessment: none in chart Diet Prior to this Study: NPO;Cortrak/Small bore NG tube Temperature Spikes Noted: No Respiratory Status: Nasal cannula History of Recent  Intubation: No Behavior/Cognition: Lethargic/Drowsy;Requires cueing Oral Cavity Assessment: Dry Oral Care Completed by SLP: Yes Oral Cavity - Dentition: Edentulous Self-Feeding Abilities: Total assist Patient Positioning: Upright in bed Baseline Vocal Quality: Hoarse Volitional Cough: Cognitively unable to elicit Volitional Swallow: Unable to elicit    Oral/Motor/Sensory Function Overall Oral Motor/Sensory Function: Other (comment) (not following commands well for assessment)   Ice Chips Ice chips: Impaired Presentation: Spoon Oral Phase Impairments: Poor awareness of bolus   Thin Liquid Thin Liquid: Impaired Presentation: Cup;Self Fed;Straw Oral Phase Impairments: Poor awareness of bolus Pharyngeal  Phase Impairments: Cough - Immediate    Nectar Thick Nectar Thick Liquid: Not tested   Honey Thick Honey Thick Liquid: Not tested   Puree Puree: Impaired Presentation: Spoon Oral Phase Impairments: Poor awareness of bolus   Solid     Solid: Not tested      Leita SAILOR., M.A. CCC-SLP Acute Rehabilitation Services Office: [phone]  Secure chat preferred  12/12/2023,12:16 PM

## 2023-12-12 NOTE — Progress Notes (Signed)
 RT note. Patient placed on 3 L [place] at this time sat 100% with no labored breathing. RT will continue to monitor.    12/12/23 0842  Therapy Vitals  Pulse Rate 92  Resp 20  MEWS Score/Color  MEWS Score 2  MEWS Score Color Yellow  Respiratory Assessment  Assessment Type Mid-treatment  Respiratory Pattern Regular;Unlabored  Chest Assessment Chest expansion symmetrical  Bilateral Breath Sounds Diminished  Oxygen Therapy/Pulse Ox  O2 Device (S)  Nasal Cannula  O2 Therapy Oxygen humidified  O2 Flow Rate (L/min) 3 L/min  SpO2 100 %

## 2023-12-12 NOTE — Consult Note (Signed)
 WOC Nurse Consult Note: patient with history of PAD  Reason for Consult: wounds  Wound type: 1.  Full thickness L toes arterial 2.  Full thickness B lower legs r/t venous insufficiency  Pressure Injury POA: NA not pressure  Measurement: see nursing flowsheet  Wound bed:1.  L 2nd digit dark eschar  2. B lower legs with scattered full thickness ulcerations with dry brown necrotic tissue  Drainage (amount, consistency, odor) see nursing flowsheet  Periwound: erythema changes consistent with venous stasis  Dressing procedure/placement/frequency:  Paint L toe wounds twice daily with Betadine  and leave open to air or cover with dry gauze and tape.  Cleanse B lower legs (intact skin and ulcerations) with Vashe wound cleanser Soila [phone]) do not rinse and allow to air dry. Cover wound beds with Xeroform gauze Soila [phone]) then secure with Kerlix roll gauze beginning above toes and ending right below knees. May apply Ace bandage wrapped in same fashion as Kerlix for light compression.  POC discussed with primary nurse. WOC team will not follow. Re-consult if further needs arise.   Thank you,    Powell Bar MSN, RN-BC, [employer]

## 2023-12-12 NOTE — Progress Notes (Signed)
 RT note. Patient placed back on bipap at this time, RT will continue to monitor.    12/12/23 1354  BiPAP/CPAP/SIPAP  BiPAP/CPAP/SIPAP Pt Type Adult  BiPAP/CPAP/SIPAP SERVO  Mask Type Full face mask  Set Rate 12 breaths/min  Respiratory Rate 22 breaths/min  IPAP 14 cmH20  EPAP 6 cmH2O  PEEP 6 cmH20  FiO2 (%) 40 %  Minute Ventilation 7.3  Leak 54  Peak Inspiratory Pressure (PIP) 14  Tidal Volume (Vt) 402  Patient Home Machine No  BiPAP/CPAP /SiPAP Vitals  Pulse Rate 94  Resp (!) 22  SpO2 100 %  MEWS Score/Color  MEWS Score 2  MEWS Score Color Yellow

## 2023-12-12 NOTE — Progress Notes (Signed)
 Daily Progress Note   Patient Name: Robert Ramirez       Date: 12/12/2023 DOB: 1945/09/06  Age: 78 y.o. MRN#: [id number] Attending Physician: Annella Donnice SAUNDERS, MD Primary Care Physician: Lorren Greig PARAS, NP Admit Date: 12/07/2023  Reason for Consultation/Follow-up: Establishing goals of care  Length of Stay: 5  Current Medications: Scheduled Meds:   aspirin   81 mg Per Tube Daily   atorvastatin   40 mg Per Tube Daily   Chlorhexidine  Gluconate Cloth  6 each Topical Daily   enoxaparin  (LOVENOX ) injection  40 mg Subcutaneous Q24H   ezetimibe   10 mg Per Tube Daily   feeding supplement (PROSource TF20)  60 mL Per Tube Daily   insulin  aspart  0-20 Units Subcutaneous Q4H   insulin  glargine-yfgn  10 Units Subcutaneous Daily   ipratropium-albuterol   3 mL Nebulization Q6H   [START ON 12/13/2023] levothyroxine   50 mcg Per Tube Q0600   liver oil-zinc  oxide   Topical BID   multivitamin with minerals  1 tablet Per Tube Daily   nicotine   14 mg Transdermal Daily   mouth rinse  15 mL Mouth Rinse 4 times per day   sodium chloride  flush  3 mL Intravenous Q12H   thiamine   100 mg Per Tube Daily    Continuous Infusions:  amiodarone  30 mg/hr (12/12/23 1400)    ceFAZolin  (ANCEF ) IV Stopped (12/12/23 0935)   feeding supplement (VITAL 1.5 CAL) 40 mL/hr at 12/12/23 1400    PRN Meds: acetaminophen  (TYLENOL ) oral liquid 160 mg/5 mL, acetaminophen  **OR** acetaminophen , docusate, ipratropium-albuterol , mouth rinse, polyethylene glycol  Physical Exam Vitals reviewed.  Constitutional:      General: He is not in acute distress.    Appearance: He is ill-appearing.  Cardiovascular:     Rate and Rhythm: Normal rate.  Pulmonary:     Effort: Tachypnea present.             Vital Signs: BP (!) 103/52    Pulse 91   Temp 97.9 F (36.6 C) (Axillary)   Resp 18   Ht 6' 1 (1.854 m)   Wt 82.2 kg   SpO2 100%   BMI 23.91 kg/m  SpO2: SpO2: 100 % O2 Device: O2 Device: (S) Bi-PAP O2 Flow Rate: O2 Flow Rate (L/min): 3 L/min    Patient Active Problem List  Diagnosis Date Noted   (HFpEF) heart failure with preserved ejection fraction (HCC) 12/07/2023   Cellulitis 12/07/2023   Elevated troponin 12/07/2023   SIRS (systemic inflammatory response syndrome) (HCC) 12/07/2023   Uncontrolled type 2 diabetes mellitus with hyperglycemia, with long-term current use of insulin  (HCC) 12/07/2023   Acute kidney injury superimposed on chronic kidney disease (HCC) 12/07/2023   History of permanent cardiac pacemaker placement 12/07/2023   OSA treated with BiPAP 12/07/2023   Acute on chronic right-sided heart failure (HCC) 12/07/2023   AV block 06/12/2023   Complete heart block (HCC) 05/28/2023   Poorly-controlled hypertension 04/05/2019   Conductive hearing loss 04/05/2019   Chewing tobacco use 04/05/2019   Bilateral impacted cerumen 04/05/2019   Primary hyperparathyroidism (HCC) 11/21/2018   Chronic respiratory failure with hypoxia and hypercapnia (HCC) 11/18/2018   Chronic obstructive asthma (with obstructive pulmonary disease) (HCC) 06/09/2018   Other secondary pulmonary hypertension (HCC) 06/09/2018   Healthcare maintenance 03/30/2018   Serum total bilirubin elevated 03/26/2018   Asbestos exposure 03/26/2018   Lesion of skin of scalp 03/26/2018   Coronary artery disease involving native coronary artery of native heart without angina pectoris 03/26/2018   Acquired hypothyroidism 03/26/2018   Pleural plaque 03/14/2018   Diabetes (HCC) 03/14/2018   Somnolence    Hyponatremia    Abnormal chest x-ray    Hypertension associated with diabetes (HCC) 07/18/2014   Mixed hyperlipidemia 07/18/2014   Diabetic retinopathy associated with type 2 diabetes mellitus (HCC) 07/18/2014    Palliative Care  Assessment & Plan   Patient Profile: 78 y.o. male  with past medical history of HLD, HTN, CAD, HFpEF, complete heart block s/p pacemaker admitted on 12/07/2023 with weakness, SOB, lower extremity edema. He was admitted and treatment started for LE cellulitis, and heart failure. ECHO this admission showed EF 60-65% and enlarged RV. 7/15 he had hypotension and received multiple fluid boluses. Right heart cath performed 7/16. Recovery is complicated due to difficulty tolerating diuresis with  hypotension and worsening creatinine. He has also had poor mental status likely related to hypercarbia and/or delirium. May require CRRT. He is on milrinone  and norepinephrine . Prognosis is poor with end stage R heart failure and pulmonary hypertension. Palliative consulted for GOC.   Today's Discussion: Chart reviewed and updates received from nursing and CCM provider. Patient has had improvement in kidney function- creatinine down to 1.93. Patient's mental status continues to wax and wane. Patient is lethargic. RT at bedside applying BiPAP mask. Patient's wife and son at bedside.  Patient's wife shared her concern that the patient has worsened since being hospitalized. We discussed the patient's heart failure and pulmonary hypertension. We discussed his improving AKI. She believes his lethargy is proof he is not improving. We discussed his increased risk for delirium. I reassured her that he is getting the highest level of care in the ICU. We discussed that sometimes despite our best efforts patient's do not return to their health/functional status they had before the admission. Patient's wife repeated again  I do not understand how you come to the hospital and get worse.  Patient's family agreed to PMT checking in tomorrow. PMT will continue to support.    Recommendations/Plan: Full code Full scope Continued PMT support    Code Status:    Code Status Orders  (From admission, onward)            Start     Ordered   12/09/23 1403  Full code  Continuous       Question:  By:  Answer:  Default: patient does not have capacity for decision making, no surrogate or prior directive available   12/09/23 1405         Extensive chart review has been completed prior to seeing the patient including labs, vital signs, imaging, progress/consult notes, orders, medications, and available advance directive documents.  Care plan was discussed with Dr. Annella and bedside RN  Time spent: 35 minutes  Thank you for allowing the Palliative Medicine Team to assist in the care of this patient.    Stephane CHRISTELLA Palin, NP  Please contact Palliative Medicine Team phone at [phone] for questions and concerns.

## 2023-12-13 ENCOUNTER — Ambulatory Visit: Payer: Self-pay | Admitting: Cardiology

## 2023-12-13 DIAGNOSIS — J9622 Acute and chronic respiratory failure with hypercapnia: Secondary | ICD-10-CM | POA: Diagnosis not present

## 2023-12-13 DIAGNOSIS — I272 Pulmonary hypertension, unspecified: Secondary | ICD-10-CM | POA: Diagnosis not present

## 2023-12-13 DIAGNOSIS — N179 Acute kidney failure, unspecified: Secondary | ICD-10-CM | POA: Diagnosis not present

## 2023-12-13 DIAGNOSIS — I5033 Acute on chronic diastolic (congestive) heart failure: Secondary | ICD-10-CM | POA: Diagnosis not present

## 2023-12-13 LAB — GLUCOSE, CAPILLARY
Glucose-Capillary: 154 mg/dL — ABNORMAL HIGH (ref 70–99)
Glucose-Capillary: 154 mg/dL — ABNORMAL HIGH (ref 70–99)
Glucose-Capillary: 226 mg/dL — ABNORMAL HIGH (ref 70–99)
Glucose-Capillary: 220 mg/dL — ABNORMAL HIGH (ref 70–99)
Glucose-Capillary: 221 mg/dL — ABNORMAL HIGH (ref 70–99)
Glucose-Capillary: 180 mg/dL — ABNORMAL HIGH (ref 70–99)

## 2023-12-13 LAB — BASIC METABOLIC PANEL WITH GFR
Anion gap: 10 (ref 5–15)
BUN: 62 mg/dL — ABNORMAL HIGH (ref 8–23)
CO2: 41 mmol/L — ABNORMAL HIGH (ref 22–32)
Calcium: 10 mg/dL (ref 8.9–10.3)
Chloride: 89 mmol/L — ABNORMAL LOW (ref 98–111)
Creatinine, Ser: 1.4 mg/dL — ABNORMAL HIGH (ref 0.61–1.24)
GFR, Estimated: 52 mL/min — ABNORMAL LOW (ref >60)
Glucose, Bld: 174 mg/dL — ABNORMAL HIGH (ref 70–99)
Potassium: 4.1 mmol/L (ref 3.5–5.1)
Sodium: 140 mmol/L (ref 135–145)

## 2023-12-13 LAB — PHOSPHORUS: Phosphorus: 3.1 mg/dL (ref 2.5–4.6)

## 2023-12-13 LAB — MAGNESIUM: Magnesium: 2.3 mg/dL (ref 1.7–2.4)

## 2023-12-13 NOTE — Progress Notes (Signed)
 NAME:  Robert Ramirez, MRN:  [id number], DOB:  1946-02-04, LOS: 6 ADMISSION DATE:  12/07/2023, CONSULTATION DATE:  7/16 REFERRING MD:  Dr. Cherrie, AHF CHIEF COMPLAINT:  RV failure, respiratory failure   History of Present Illness:  78 year old male with past medical history of diabetes, hypercholesterolemia, hypertension, CAD, HFpEF with RV failure, complete heart block s/p dual chamber PPM 05/2023, OSA and chronic respiratory failure on BiPAP at home, asbestos exposure with pleural plaque without ILD who presented to the emergency department on 12/07/23 with complaint of weakness for 2 weeks. Noted increased LE edema, EMS noting O2 sat ~92% requiring supplemental O2. In ED, afebrile, soft BP ~90s systolic, BNP 1195, WBC 18, sCr 1.8. Given Ancef  for concern of lower extremity cellulitis and was admitted to hospitalist. TRH started patient on BID Lasix  and repeated his echo demonstrating LVEF 60-65%, RV enlarged, estimated RVSP of 58. 7/15 early AM had soft BP. TRH gave patient 250cc LR bolus. Improved pressure which drifted back down so given additional 500cc bolus then 250 again for total of 1L IVF. 7/15 morning, AHF was consulted with worsened RV failure from previous admission, and recommended RHC, started on midodrine . 7/16, drowsy and disoriented, SOB and sCr increased with diuresis. Went for RHC and had ABG performed showing pCO2 >100. CCM was consulted to cath lab and patient was placed on BiPAP. AHF requesting ICU bed and CCM consult.   Pertinent  Medical History  History of diabetes, hypercholesterolemia, hypertension, CAD, HFpEF with RV failure, complete heart block s/p dual chamber PPM 05/2023, OSA and chronic respiratory failure on BiPAP at home, asbestos exposure with pleural plaque without ILD   Significant Hospital Events: Including procedures, antibiotic start and stop dates in addition to other pertinent events   7/14: admit for heart failure, started on antibiotics for cellulitis  coverage; echo with worsened RV failure  7/15: soft BP, given 1L IVF with initial improvement. Started on midodrine  and AHF consult  7/16: drowsy, disoriented, pCO2 high in cath lab, started on BiPAP. AHF request for CCM involvement and transfer to ICU.   Interim History / Subjective:  Doing better.  Creatinine continue improved.  Urine output is reasonable.  Net even despite no Lasix .  Overall net negative.  Exam is improved.  Encephalopathy waxes and wanes likely multifactorial.  Ready for transfer out of the ICU  Objective    Blood pressure (!) 115/57, pulse 70, temperature 97.6 F (36.4 C), temperature source Axillary, resp. rate 18, height 6' 1 (1.854 m), weight 81.5 kg, SpO2 100%.    Vent Mode: PCV FiO2 (%):  [30 %-40 %] 30 % Set Rate:  [16 bmp] 16 bmp PEEP:  [6 cmH20] 6 cmH20   Intake/Output Summary (Last 24 hours) at 12/13/2023 0806 Last data filed at 12/13/2023 0600 Gross per 24 hour  Intake 1375.5 ml  Output 1300 ml  Net 75.5 ml   Filed Weights   12/11/23 0500 12/12/23 0708 12/13/23 0409  Weight: 81 kg 82.2 kg 81.5 kg   Examination: General: older male, acute on chronically ill appearing  HENT: perrla, ncat, large skin lesion to the right posterior scalp and neck, [place]  Lungs: diminished bilaterally, [place]  Cardiovascular: s1s2, cannot appreciate murmur  Abdomen: soft, hypoactive bowel sounds  Extremities: bilateral lower extremity trace pitting edema, BLE ruddy erythema  Neuro: Aox1, intermittent drowsiness, nonfocal GU: external UC  Resolved problem list   Assessment and Plan   Acute on chronic hypercarbic respiratory failure: Unclear inciting event.  Improved on BiPAP.  Poss related to volume overload although chest x-ray looks pretty stable. -- BiPAP at bedtime (uses at home), [place] O2 sat goal 88% -- Lasix  holiday   Acute kidney injury: Presumed cardiorenal with elevated right atrial pressure. -- s/p aggressive diuresis, UOP responded well to lasix , Cr improving,  holding Lasix  given minimal p.o. intake and improvement in exam -- Poor dialysis candidate   Pulmonary hypertension, RV failure: Cardiac index less than 2.  Based on catheterization, it is precapillary.  Group 1 disease possible, group 3 disease although parenchymal process is pretty minimal. -- No evidence of ILD on previous high-res CT, he does have some restriction related to pleural plaques, extraparenchymal restriction -- TTE indicates normal left atrial size, no significant valvular dysfunctions, presumably Group 1 disease although given he is a male and elderly age idiopathic would be unusual, would send serologies for connective tissue as outpatient -- Diuresis as above, consider addition of PDE 5 inhibitor if shows improvement, and if tolerates additional oral agents, he is a poor candidate for IV prostacyclin given his multifactorial disease, advanced age, kidney dysfunction, concern for skin malignancy although this is been present for many years so may not be malignant -- Appreciate palliative care assistance   Hypotension-improved: SVR in cath lab ~1000, worsened since milrinone , suspect reduction in SVR due to milrinone . LA WNL - not shock. No infectious source other than cellulitis - aggressively treated and more likely chronic venous stasis changes . -- Norepinephrine  to aid in blood pressure weaned off --Milrinone  off  Afib:  --Amio --Consider starting AC   Lower extremity cellulitis: vs venous stasis -- Continue antibiotics x 7 days   Metabolic encephalopathy:  --now with delirium, Zyprexa  d/c'd now on amio   Diabetes: Hyperglycemia with TF starting, hyperglycemia improved --10 units glargine, SSI   Skin lesion: Present since last 2019 concern for malignancy based on results of MRI in 2019.  Has not followed up on this. -- Outpatient follow-up if survives  Best Practice (right click and Reselect all SmartList Selections daily)   Diet/type: NPO, TFs DVT prophylaxis:  LMWH GI prophylaxis: N/A Lines: Central line, Arterial Line, and yes and it is still needed Foley:  N/A Code Status:  full code Last date of multidisciplinary goals of care discussion [7/19 discussed with wife and son at bedside, repeatedly reiterated interventions we are doing, what has improved and what has not improved, wife perseverates and repeats phrases over and over again despite redirection and correction, fear there is significant cognitive impairment that impedes her ability to understand or make decisions]  Labs   CBC: Recent Labs  Lab 12/07/23 0801 12/08/23 0401 12/09/23 0242 12/09/23 1255 12/10/23 0911 12/10/23 1116 12/11/23 0722 12/11/23 1017  WBC 18.2* 16.0* 14.8*  --  15.8*  --  9.4  --   NEUTROABS 16.1*  --  12.8*  --   --   --   --   --   HGB 16.4 14.3 14.1 16.3 14.4 15.0 14.0 15.6  HCT 51.1 46.4 46.0 48.0 45.4 44.0 43.5 46.0  MCV 93.1 94.7 94.5  --  92.7  --  90.4  --   PLT 165 163 192  --  221  --  186  --    Basic Metabolic Panel: Recent Labs  Lab 12/09/23 0242 12/09/23 1255 12/10/23 0020 12/10/23 1116 12/11/23 0350 12/11/23 1017 12/12/23 0500 12/13/23 0345  NA 134*   < > 133* 132* 135 134* 136 140  K 4.3   < > 3.6 3.8  3.1* 3.4* 3.6 4.1  CL 91*  --  92*  --  87*  --  83* 89*  CO2 33*  --  29  --  35*  --  38* 41*  GLUCOSE 221*  --  196*  --  140*  --  221* 174*  BUN 60*  --  67*  --  59*  --  59* 62*  CREATININE 2.33*  --  2.48*  --  1.93*  --  1.57* 1.40*  CALCIUM  9.7  --  9.5  --  10.2  --  9.7 10.0  MG 2.0  --  1.8  --  1.8  --  2.3 2.3  PHOS  --   --   --   --  1.8*  --  3.1 3.1   < > = values in this interval not displayed.   GFR: Estimated Creatinine Clearance: 50.7 mL/min (A) (by C-G formula based on SCr of 1.4 mg/dL (H)). Recent Labs  Lab 12/08/23 0401 12/08/23 0714 12/09/23 0242 12/09/23 1636 12/09/23 2004 12/10/23 0911 12/11/23 0722  WBC 16.0*  --  14.8*  --   --  15.8* 9.4  LATICACIDVEN  --  0.9  --  0.7 0.7  --   --     Liver Function Tests: Recent Labs  Lab 12/07/23 0801 12/10/23 0020  AST 28 22  ALT 16 12  ALKPHOS 106 114  BILITOT 2.5* 1.0  PROT 6.1* 5.7*  ALBUMIN 3.0* 2.3*   No results for input(s): LIPASE, AMYLASE in the last 168 hours. Recent Labs  Lab 12/09/23 0934  AMMONIA 27   ABG    Component Value Date/Time   PHART 7.345 (L) 12/11/2023 1017   PCO2ART 76.8 (HH) 12/11/2023 1017   PO2ART 100 12/11/2023 1017   HCO3 42.3 (H) 12/11/2023 1017   TCO2 45 (H) 12/11/2023 1017   O2SAT 97 12/11/2023 1017    Coagulation Profile: No results for input(s): INR, PROTIME in the last 168 hours.  Cardiac Enzymes: No results for input(s): CKTOTAL, CKMB, CKMBINDEX, TROPONINI in the last 168 hours.  HbA1C: Hemoglobin A1C  Date/Time Value Ref Range Status  09/30/2023 11:18 AM 8.0 (A) 4.0 - 5.6 % Final  08/04/2022 02:50 PM 7.6 (A) 4.0 - 5.6 % Final   HbA1c, POC (controlled diabetic range)  Date/Time Value Ref Range Status  03/15/2021 10:01 AM 8.3 (A) 0.0 - 7.0 % Final   Hgb A1c MFr Bld  Date/Time Value Ref Range Status  06/12/2023 06:14 PM 7.5 (H) 4.8 - 5.6 % Final    Comment:    (NOTE) Pre diabetes:          5.7%-6.4%  Diabetes:              >6.4%  Glycemic control for   <7.0% adults with diabetes   01/28/2022 02:00 PM 8.2 (H) 4.8 - 5.6 % Final    Comment:             Prediabetes: 5.7 - 6.4          Diabetes: >6.4          Glycemic control for adults with diabetes: <7.0    CBG: Recent Labs  Lab 12/12/23 1502 12/12/23 1910 12/12/23 2308 12/13/23 0306 12/13/23 0719  GLUCAP 218* 181* 195* 154* 154*   Review of Systems:   Unable to obtain due to encephalopathy  Past Medical History:  He,  has a past medical history of Diabetes mellitus, High cholesterol, and Hypertension.  Surgical History:   Past Surgical History:  Procedure Laterality Date   APPENDECTOMY     ARTERIAL LINE INSERTION N/A 12/09/2023   Procedure: ARTERIAL LINE INSERTION;  Surgeon:  Cherrie Toribio SAUNDERS, MD;  Location: [facility];  Service: Cardiovascular;  Laterality: N/A;   CENTRAL LINE INSERTION  12/09/2023   Procedure: CENTRAL LINE INSERTION;  Surgeon: Cherrie Toribio SAUNDERS, MD;  Location: [facility];  Service: Cardiovascular;;   PACEMAKER IMPLANT N/A 06/12/2023   Procedure: PACEMAKER IMPLANT;  Surgeon: Kennyth Chew, MD;  Location: [facility] INVASIVE CV LAB;  Service: Cardiovascular;  Laterality: N/A;   RIGHT HEART CATH N/A 12/09/2023   Procedure: RIGHT HEART CATH;  Surgeon: Cherrie Toribio SAUNDERS, MD;  Location: [facility];  Service: Cardiovascular;  Laterality: N/A;   RIGHT/LEFT HEART CATH AND CORONARY ANGIOGRAPHY N/A 03/17/2018   Procedure: RIGHT/LEFT HEART CATH AND CORONARY ANGIOGRAPHY;  Surgeon: Claudene Pacific, MD;  Location: [facility];  Service: Cardiovascular;  Laterality: N/A;    Social History:   reports that he quit smoking about 43 years ago. His smoking use included cigarettes. He has never been exposed to tobacco smoke. His smokeless tobacco use includes chew and snuff. He reports that he does not drink alcohol and does not use drugs.   Family History:  His family history includes Cancer in his father and sister; Diabetes in his sister; Heart failure in his mother.   Allergies No Known Allergies   Home Medications  Prior to Admission medications   Medication Sig Start Date End Date Taking? Authorizing Provider  amLODipine  (NORVASC ) 10 MG tablet Take 1 tablet (10 mg total) by mouth daily. 08/04/22  Yes Lorren Greig PARAS, NP  aspirin  EC 81 MG tablet Take 81 mg by mouth daily. Swallow whole.   Yes [provider]  atorvastatin  (LIPITOR) 40 MG tablet Take 40 mg by mouth daily.   Yes [provider]  chlorthalidone  (HYGROTON ) 25 MG tablet TAKE 1 TABLET EVERY DAY 10/16/22  Yes Thukkani, Arun K, MD  Cholecalciferol (VITAMIN D-3) 25 MCG (1000 UT) CAPS Take 1 capsule by mouth every morning.   Yes [provider]   empagliflozin  (JARDIANCE ) 10 MG TABS tablet Take 1 tablet (10 mg total) by mouth daily before breakfast. 05/07/23  Yes Thukkani, Arun K, MD  ezetimibe  (ZETIA ) 10 MG tablet Take 1 tablet (10 mg total) by mouth daily. 11/05/23  Yes Thukkani, Arun K, MD  Insulin  NPH, Human,, Isophane, (HUMULIN  N KWIKPEN) 100 UNIT/ML Kiwkpen Inject 33 Units into the skin in the morning and at bedtime. 10/02/23  Yes Newlin, Enobong, MD  levothyroxine  (SYNTHROID ) 50 MCG tablet Take 1 tablet (50 mcg total) by mouth daily. 09/30/23 12/29/23 Yes Lorren, Amy J, NP  lisinopril  (ZESTRIL ) 40 MG tablet TAKE 1 TABLET EVERY DAY 10/16/22  Yes Thukkani, Arun K, MD  metFORMIN  (GLUCOPHAGE ) 1000 MG tablet Take 1 tablet (1,000 mg total) by mouth 2 (two) times daily with a meal. 09/30/23 12/29/23 Yes Lorren, Amy J, NP  nitroGLYCERIN  (NITROSTAT ) 0.4 MG SL tablet Place 1 tablet (0.4 mg total) under the tongue every 5 (five) minutes as needed for chest pain. 03/29/21  Yes Thukkani, Arun K, MD  spironolactone  (ALDACTONE ) 25 MG tablet TAKE 1 TABLET EVERY DAY Patient taking differently: Take 12.5 mg by mouth daily. 03/12/23  Yes Thukkani, Arun K, MD  vitamin B-12 (CYANOCOBALAMIN ) 500 MCG tablet Take 500 mcg by mouth daily.   Yes [provider]  blood glucose meter kit and  supplies Dispense based on patient and insurance preference. Use up to four times daily as directed. (FOR ICD-10 E10.9, E11.9). 02/08/22   Lorren Greig PARAS, NP  Insulin  Pen Needle (DROPLET PEN NEEDLES) 31G X 8 MM MISC 1 each by Other route 2 (two) times daily with a meal. 08/04/22   Lorren Greig PARAS, NP    Critical care time: n/a     Donnice JONELLE Beals, MD See TRACEY

## 2023-12-13 NOTE — Progress Notes (Signed)
 Called wife, Romero, the provide update.  Went to [employer].  No option to leave voicemail.

## 2023-12-14 ENCOUNTER — Inpatient Hospital Stay (HOSPITAL_COMMUNITY)

## 2023-12-14 DIAGNOSIS — Z7189 Other specified counseling: Secondary | ICD-10-CM | POA: Diagnosis not present

## 2023-12-14 DIAGNOSIS — E43 Unspecified severe protein-calorie malnutrition: Secondary | ICD-10-CM | POA: Insufficient documentation

## 2023-12-14 DIAGNOSIS — Z515 Encounter for palliative care: Secondary | ICD-10-CM | POA: Diagnosis not present

## 2023-12-14 DIAGNOSIS — R6510 Systemic inflammatory response syndrome (SIRS) of non-infectious origin without acute organ dysfunction: Secondary | ICD-10-CM | POA: Diagnosis not present

## 2023-12-14 DIAGNOSIS — I50810 Right heart failure, unspecified: Secondary | ICD-10-CM | POA: Diagnosis not present

## 2023-12-14 DIAGNOSIS — I2721 Secondary pulmonary arterial hypertension: Secondary | ICD-10-CM | POA: Diagnosis not present

## 2023-12-14 LAB — BASIC METABOLIC PANEL WITH GFR
Anion gap: 9 (ref 5–15)
BUN: 53 mg/dL — ABNORMAL HIGH (ref 8–23)
CO2: 43 mmol/L — ABNORMAL HIGH (ref 22–32)
Calcium: 10.3 mg/dL (ref 8.9–10.3)
Chloride: 91 mmol/L — ABNORMAL LOW (ref 98–111)
Creatinine, Ser: 1.11 mg/dL (ref 0.61–1.24)
GFR, Estimated: >60 mL/min (ref >60)
Glucose, Bld: 175 mg/dL — ABNORMAL HIGH (ref 70–99)
Potassium: 4.1 mmol/L (ref 3.5–5.1)
Sodium: 143 mmol/L (ref 135–145)

## 2023-12-14 LAB — PHOSPHORUS: Phosphorus: 1.7 mg/dL — ABNORMAL LOW (ref 2.5–4.6)

## 2023-12-14 LAB — GLUCOSE, CAPILLARY
Glucose-Capillary: 170 mg/dL — ABNORMAL HIGH (ref 70–99)
Glucose-Capillary: 253 mg/dL — ABNORMAL HIGH (ref 70–99)
Glucose-Capillary: 207 mg/dL — ABNORMAL HIGH (ref 70–99)
Glucose-Capillary: 245 mg/dL — ABNORMAL HIGH (ref 70–99)
Glucose-Capillary: 228 mg/dL — ABNORMAL HIGH (ref 70–99)
Glucose-Capillary: 174 mg/dL — ABNORMAL HIGH (ref 70–99)

## 2023-12-14 MED ORDER — K PHOS MONO-SOD PHOS DI & MONO 155-852-130 MG PO TABS
500.0000 mg | ORAL_TABLET | ORAL | Status: DC
  Administered 2023-12-14 (×2): 500 mg
  Filled 2023-12-14 (×4): qty 2

## 2023-12-14 MED ORDER — ASPIRIN 81 MG PO CHEW
81.0000 mg | CHEWABLE_TABLET | Freq: Every day | ORAL | Status: DC
  Administered 2023-12-15 – 2023-12-19 (×5): 81 mg via ORAL
  Filled 2023-12-14 (×5): qty 1

## 2023-12-14 MED ORDER — K PHOS MONO-SOD PHOS DI & MONO 155-852-130 MG PO TABS
500.0000 mg | ORAL_TABLET | ORAL | Status: AC
  Administered 2023-12-14 (×2): 500 mg via ORAL
  Filled 2023-12-14 (×2): qty 2

## 2023-12-14 MED ORDER — SPIRONOLACTONE 25 MG PO TABS
25.0000 mg | ORAL_TABLET | Freq: Every day | ORAL | Status: DC
  Administered 2023-12-14 – 2023-12-19 (×6): 25 mg via ORAL
  Filled 2023-12-14 (×6): qty 1

## 2023-12-14 MED ORDER — EZETIMIBE 10 MG PO TABS
10.0000 mg | ORAL_TABLET | Freq: Every day | ORAL | Status: DC
  Administered 2023-12-15 – 2023-12-19 (×5): 10 mg via ORAL
  Filled 2023-12-14 (×5): qty 1

## 2023-12-14 MED ORDER — VITAL 1.5 CAL PO LIQD
1000.0000 mL | ORAL | Status: DC
  Administered 2023-12-14 – 2023-12-15 (×2): 1000 mL

## 2023-12-14 MED ORDER — DOCUSATE SODIUM 50 MG/5ML PO LIQD: 100.0000 mg | Freq: Two times a day (BID) | ORAL | Status: DC | PRN

## 2023-12-14 MED ORDER — TORSEMIDE 20 MG PO TABS
20.0000 mg | ORAL_TABLET | Freq: Every day | ORAL | Status: DC
  Administered 2023-12-14 – 2023-12-19 (×6): 20 mg via ORAL
  Filled 2023-12-14 (×6): qty 1

## 2023-12-14 MED ORDER — INSULIN GLARGINE-YFGN 100 UNIT/ML ~~LOC~~ SOLN
15.0000 [IU] | Freq: Every day | SUBCUTANEOUS | Status: DC
  Administered 2023-12-15: 15 [IU] via SUBCUTANEOUS
  Filled 2023-12-14 (×2): qty 0.15

## 2023-12-14 MED ORDER — ADULT MULTIVITAMIN W/MINERALS CH
1.0000 | ORAL_TABLET | Freq: Every day | ORAL | Status: DC
  Administered 2023-12-15 – 2023-12-19 (×5): 1 via ORAL
  Filled 2023-12-14 (×5): qty 1

## 2023-12-14 MED ORDER — ACETAMINOPHEN 325 MG PO TABS: 650.0000 mg | ORAL_TABLET | Freq: Four times a day (QID) | ORAL | Status: DC | PRN

## 2023-12-14 MED ORDER — ACETAMINOPHEN 650 MG RE SUPP: 650.0000 mg | Freq: Four times a day (QID) | RECTAL | Status: DC | PRN

## 2023-12-14 MED ORDER — THIAMINE MONONITRATE 100 MG PO TABS
100.0000 mg | ORAL_TABLET | Freq: Every day | ORAL | Status: AC
  Administered 2023-12-15 – 2023-12-17 (×3): 100 mg via ORAL
  Filled 2023-12-14 (×3): qty 1

## 2023-12-14 MED ORDER — ATORVASTATIN CALCIUM 40 MG PO TABS
40.0000 mg | ORAL_TABLET | Freq: Every day | ORAL | Status: DC
  Administered 2023-12-15 – 2023-12-19 (×5): 40 mg via ORAL
  Filled 2023-12-14 (×5): qty 1

## 2023-12-14 MED ORDER — LEVOTHYROXINE SODIUM 50 MCG PO TABS
50.0000 ug | ORAL_TABLET | Freq: Every day | ORAL | Status: DC
  Administered 2023-12-15 – 2023-12-19 (×5): 50 ug via ORAL
  Filled 2023-12-14: qty 2
  Filled 2023-12-14: qty 1
  Filled 2023-12-14: qty 2
  Filled 2023-12-14 (×2): qty 1

## 2023-12-14 MED ORDER — POLYETHYLENE GLYCOL 3350 17 G PO PACK: 17.0000 g | PACK | Freq: Every day | ORAL | Status: DC | PRN

## 2023-12-14 MED ORDER — FREE WATER
50.0000 mL | Status: DC
  Administered 2023-12-14 – 2023-12-16 (×10): 50 mL

## 2023-12-14 NOTE — Evaluation (Signed)
 Modified Barium Swallow Study  Patient Details  Name: Robert Ramirez MRN: [id number] Date of Birth: 08/24/1945  Today's Date: 12/14/2023  Modified Barium Swallow completed.  Full report located under Chart Review in the Imaging Section.  History of Present Illness Pt is a 78 y.o. M presenting to [facility] on 12/07/23 w/ leg weakness and swelling. Admitted for heart failure, pulmonary HTN, and cellulitis. Pt was initially on heart healthy diet but was made NPO 7/16 for RHC and kept NPO due to AMS and respiratory failure. PMH is significant for HTN, HLD, HFpEF, heart block s/p PPM, and DMT2.   Clinical Impression Pt has a pharyngeal more than oral dysphagia with incomplete hyolaryngeal movement, epiglottic inversion, and laryngeal vestibule closure that results in incomplete airway protection during the swallow. He has deep penetration to the vocal folds (PAS 5) and silent aspiration (PAS 8) with thin liquids, which does not clear with a cued cough. He still aspirates when using a chin tuck and a supraglottic swallow helps to reduce some of the aspiration but does not fully protect the airway. Transient aspiration occurred with nectar thick liquids x1 (PAS 6) only with the barium tablet, but otherwise he only had transient penetration (PAS 2). Mild pharyngeal residue was observed throughout most of the study due to reduced base of tongue retraction and UES opening, but the barium tablet also became lodged in the valleculae. It required a bolus of puree and a chin tuck to clear. Note that pt reports having some difficulty with mastication at baseline due to lack of dentition. Recommend starting with Dys 2 (finely chopped) diet and nectar thick liquids, avoiding mixed consistencies. SLP will f/u for trial of therapy to try to improve pharyngeal function and airway protection.   Factors that may increase risk of adverse event in presence of aspiration Noe & Lianne 2021): Poor general health and/or  compromised immunity;Respiratory or GI disease;Frail or deconditioned;Weak cough;Presence of tubes (ETT, trach, NG, etc.)  Swallow Evaluation Recommendations Recommendations: PO diet PO Diet Recommendation: Dysphagia 1 (Pureed);Mildly thick liquids (Level 2, nectar thick) Liquid Administration via: Cup Medication Administration: Crushed with puree Supervision: Patient able to self-feed;Full supervision/cueing for swallowing strategies Swallowing strategies  : Minimize environmental distractions;Slow rate;Small bites/sips;Follow solids with liquids;Avoid mixed consistencies Postural changes: Position pt fully upright for meals;Stay upright 30-60 min after meals Oral care recommendations: Oral care BID (2x/day) Caregiver Recommendations: Avoid jello, ice cream, thin soups, popsicles;Remove water  pitcher      Leita SAILOR., M.A. CCC-SLP Acute Rehabilitation Services Office: [phone]  Secure chat preferred  12/14/2023,1:23 PM

## 2023-12-14 NOTE — Progress Notes (Addendum)
 PROGRESS NOTE    Robert Ramirez  FMW:[id number] DOB: [birth year] DOA: 12/07/2023 PCP: Lorren Greig PARAS, NP  78/M with history of type 2 diabetes mellitus, CAD, diastolic CHF, RV failure, OSA/OHS on BiPAP, PPM, history of asbestos exposure with pleural plaque, presented to the ED with lower extremity edema and weakness, admitted with CHF exacerbation and cellulitis - Cards following, diuresed, Rx w antibiotics as well - 7/16 complicated by worsening AKI, lethargy, vancomycin  discontinued - RHC 7/16 noted RA pressure of 16, PA mean 34, wedge 11, cardiac index 1.8> ABG with severe respiratory acidosis, hypercarbia> transferred to ICU, treated with BiPAP - Treated with diuretics, norepinephrine  and milrinone  - Stabilizing, now weaned off pressors - Transferred back from PCCM to TRH service 7/21   Subjective: - Used BiPAP for less than an hour yesterday, patient not remember this, noncompliant per staff  Assessment and Plan:  Acute on chronic HFpEF, RV failure, pulmonary HTN:  -Echo with EF 60-65%, flattened IV septum, RV enlarged, RVSP 58, moderate TR -CHF team following, volume status improving - RHC 7/16 noted RA pressure of 16, PA mean 48/26, wedge 11, cardiac index 1.8 - Appears euvolemic, diuretics on hold, now off milrinone  and norepinephrine  - Cards following, could taper off Amio, ?add diamox  - Increase activity, PT OT eval, compliance with BiPAP emphasized - Seen by palliative care, plan for full code and full scope  Toxic encephalopathy, hypercarbia Delirium - ABG 7/16 noted severe respiratory acidosis, improved with BiPAP -Unfortunately with poor compliance again, emphasized -BIPAP at bedtime is critical   Sepsis due to cellulitis of RLE: - Improved, now off antibiotics   Type 2 diabetes mellitus - CBGs elevated, increase glargine   CHB s/p PPM:    Hypothyroidism: TSH wnl.  - Continue synthroid .    AKI on stage IIIa CKD:  - Likely cardiorenal, resolved   CAD, HLD:  Troponin elevation but no chest pain.  - Continue statin/zetia    Chronic respiratory failure OSA OHS History of asbestos exposure and known pleural plaques - Continue BiPAP qHS   DVT prophylaxis: Lovenox  Code Status: Full code Family Communication: None present Disposition Plan: Transfer out of ICU  Consultants: CHF team   Objective: Vitals:   12/14/23 0728 12/14/23 0800 12/14/23 0900 12/14/23 1000  BP:  (!) 128/50 (!) 140/110 (!) 130/58  Pulse: 77 82 88 79  Resp: (!) 25 (!) 28 (!) 33 (!) 35  Temp:      TempSrc:      SpO2: 100% 99% 95% 100%  Weight:      Height:        Intake/Output Summary (Last 24 hours) at 12/14/2023 1008 Last data filed at 12/14/2023 1007 Gross per 24 hour  Intake 1110.15 ml  Output 2650 ml  Net -1539.85 ml   Filed Weights   12/12/23 0708 12/13/23 0409 12/14/23 0500  Weight: 82.2 kg 81.5 kg 77.9 kg    Examination:  General exam: Elderly chronically ill, AAO x 2, mild cognitive deficits HEENT: no JVD Respiratory system: Poor air movement bilaterally Cardiovascular system: S1 & S2 heard, RRR.  Abd: nondistended, soft and nontender.Normal bowel sounds heard. Central nervous system: Awake, no localizing signs Extremities: Trace edema, chronic skin changes, dressing on right lower leg Skin: As above Psychiatry: Mild somnolence    Data Reviewed:   CBC: Recent Labs  Lab 12/08/23 0401 12/09/23 0242 12/09/23 1255 12/10/23 0911 12/10/23 1116 12/11/23 0722 12/11/23 1017  WBC 16.0* 14.8*  --  15.8*  --  9.4  --  NEUTROABS  --  12.8*  --   --   --   --   --   HGB 14.3 14.1 16.3 14.4 15.0 14.0 15.6  HCT 46.4 46.0 48.0 45.4 44.0 43.5 46.0  MCV 94.7 94.5  --  92.7  --  90.4  --   PLT 163 192  --  221  --  186  --    Basic Metabolic Panel: Recent Labs  Lab 12/09/23 0242 12/09/23 1255 12/10/23 0020 12/10/23 1116 12/11/23 0350 12/11/23 1017 12/12/23 0500 12/13/23 0345 12/14/23 0350  NA 134*   < > 133*   < > 135 134* 136 140 143   K 4.3   < > 3.6   < > 3.1* 3.4* 3.6 4.1 4.1  CL 91*  --  92*  --  87*  --  83* 89* 91*  CO2 33*  --  29  --  35*  --  38* 41* 43*  GLUCOSE 221*  --  196*  --  140*  --  221* 174* 175*  BUN 60*  --  67*  --  59*  --  59* 62* 53*  CREATININE 2.33*  --  2.48*  --  1.93*  --  1.57* 1.40* 1.11  CALCIUM  9.7  --  9.5  --  10.2  --  9.7 10.0 10.3  MG 2.0  --  1.8  --  1.8  --  2.3 2.3  --   PHOS  --   --   --   --  1.8*  --  3.1 3.1 1.7*   < > = values in this interval not displayed.   GFR: Estimated Creatinine Clearance: 62.4 mL/min (by C-G formula based on SCr of 1.11 mg/dL). Liver Function Tests: Recent Labs  Lab 12/10/23 0020  AST 22  ALT 12  ALKPHOS 114  BILITOT 1.0  PROT 5.7*  ALBUMIN 2.3*   No results for input(s): LIPASE, AMYLASE in the last 168 hours. Recent Labs  Lab 12/09/23 0934  AMMONIA 27   Coagulation Profile: No results for input(s): INR, PROTIME in the last 168 hours. Cardiac Enzymes: No results for input(s): CKTOTAL, CKMB, CKMBINDEX, TROPONINI in the last 168 hours. BNP (last 3 results) No results for input(s): PROBNP in the last 8760 hours. HbA1C: No results for input(s): HGBA1C in the last 72 hours. CBG: Recent Labs  Lab 12/13/23 1520 12/13/23 1908 12/13/23 2305 12/14/23 0259 12/14/23 0706  GLUCAP 220* 221* 180* 170* 253*   Lipid Profile: No results for input(s): CHOL, HDL, LDLCALC, TRIG, CHOLHDL, LDLDIRECT in the last 72 hours. Thyroid  Function Tests: No results for input(s): TSH, T4TOTAL, FREET4, T3FREE, THYROIDAB in the last 72 hours.  Anemia Panel: No results for input(s): VITAMINB12, FOLATE, FERRITIN, TIBC, IRON, RETICCTPCT in the last 72 hours. Urine analysis:    Component Value Date/Time   COLORURINE YELLOW 12/07/2023 0839   APPEARANCEUR CLEAR 12/07/2023 0839   LABSPEC 1.022 12/07/2023 0839   PHURINE 5.0 12/07/2023 0839   GLUCOSEU >=500 (A) 12/07/2023 0839   HGBUR NEGATIVE  12/07/2023 0839   BILIRUBINUR NEGATIVE 12/07/2023 0839   KETONESUR NEGATIVE 12/07/2023 0839   PROTEINUR NEGATIVE 12/07/2023 0839   NITRITE NEGATIVE 12/07/2023 0839   LEUKOCYTESUR NEGATIVE 12/07/2023 0839   Sepsis Labs: @LABRCNTIP (procalcitonin:4,lacticidven:4)  ) Recent Results (from the past 240 hours)  MRSA Next Gen by PCR, Nasal     Status: None   Collection Time: 12/09/23  2:03 PM   Specimen: Nasal Mucosa; Nasal Swab  Result  Value Ref Range Status   MRSA by PCR Next Gen NOT DETECTED NOT DETECTED Final    Comment: (NOTE) The GeneXpert MRSA Assay (FDA approved for NASAL specimens only), is one component of a comprehensive MRSA colonization surveillance program. It is not intended to diagnose MRSA infection nor to guide or monitor treatment for MRSA infections. Test performance is not FDA approved in patients less than 66 years old. Performed at [facility], [address] [place], [place] [address]      Radiology Studies: No results found.    Scheduled Meds:  aspirin   81 mg Per Tube Daily   atorvastatin   40 mg Per Tube Daily   Chlorhexidine  Gluconate Cloth  6 each Topical Daily   enoxaparin  (LOVENOX ) injection  40 mg Subcutaneous Q24H   ezetimibe   10 mg Per Tube Daily   feeding supplement (PROSource TF20)  60 mL Per Tube Daily   insulin  aspart  0-20 Units Subcutaneous Q4H   insulin  glargine-yfgn  10 Units Subcutaneous Daily   ipratropium-albuterol   3 mL Nebulization Q6H   levothyroxine   50 mcg Per Tube Q0600   liver oil-zinc  oxide   Topical BID   multivitamin with minerals  1 tablet Per Tube Daily   nicotine   14 mg Transdermal Daily   mouth rinse  15 mL Mouth Rinse 4 times per day   phosphorus  500 mg Per Tube Q4H   sodium chloride  flush  3 mL Intravenous Q12H   thiamine   100 mg Per Tube Daily   Continuous Infusions:  amiodarone  30 mg/hr (12/14/23 1000)   feeding supplement (VITAL 1.5 CAL) 60 mL/hr at 12/14/23 1007     LOS: 7 days    Time spent:     Sigurd Pac, MD [facility]   12/14/2023, 10:08 AM

## 2023-12-14 NOTE — TOC Progression Note (Signed)
 Transition of Care [facility]) - Progression Note    Patient Details  Name: Robert Ramirez MRN: [id number] Date of Birth: 1946/03/18  [facility] CM/SW Contact  Lauraine FORBES Saa, LCSW Phone Number: 12/14/2023, 1:47 PM  Clinical Narrative:     1:47 PM CSW attempted to call patient's spouse twice, but there were no responses and voicemails were left. CSW informed patient of physical therapy's recommendation of patient discharging to SNF, of prior SNF discussion with patient's spouse, and of SNF options left at bedside. Patient expressed understanding of the information.   Expected Discharge Plan: Skilled Nursing Facility Barriers to Discharge: Continued Medical Work up, English as a second language teacher, SNF Pending bed offer  Expected Discharge Plan and Services In-house Referral: Clinical Social Work   Post Acute Care Choice: Skilled Nursing Facility Living arrangements for the past 2 months: Single Family Home                                       Social Determinants of Health (SDOH) Interventions SDOH Screenings   Food Insecurity: No Food Insecurity (12/08/2023)  Housing: Low Risk  (12/08/2023)  Transportation Needs: No Transportation Needs (12/08/2023)  Utilities: Not At Risk (12/08/2023)  Alcohol Screen: Low Risk  (08/06/2023)  Depression (PHQ2-9): Low Risk  (09/30/2023)  Financial Resource Strain: Low Risk  (08/06/2023)  Physical Activity: Sufficiently Active (08/06/2023)  Social Connections: Moderately Isolated (12/08/2023)  Stress: No Stress Concern Present (08/06/2023)  Tobacco Use: High Risk (12/07/2023)  Health Literacy: Adequate Health Literacy (08/06/2023)    Readmission Risk Interventions     No data to display

## 2023-12-14 NOTE — Plan of Care (Signed)
  Problem: Coping: Goal: Ability to adjust to condition or change in health will improve Outcome: Progressing   Problem: Health Behavior/Discharge Planning: Goal: Ability to identify and utilize available resources and services will improve Outcome: Progressing   Problem: Nutritional: Goal: Maintenance of adequate nutrition will improve Outcome: Progressing

## 2023-12-14 NOTE — Progress Notes (Signed)
 Physical Therapy Treatment Patient Details Name: Robert Ramirez MRN: [id number] DOB: 08/28/1945 Today's Date: 12/14/2023   History of Present Illness Pt is a 78 y.o. M presenting to [facility] on 12/07/23 w/ leg weakness and swelling. PMH is significant for HTN, HLD, HFpEF, heart block s/p PPM, and DMT2.    PT Comments  Pt reports feeling better. He has an order to transfer out of ICU to progressive care today. He required min assist bed mobility, mod assist sit to stand, and min assist amb 10' with RW. Pt performed LE ex supine, sitting, and standing. VSS on 3L. Pt in recliner at end of session.     If plan is discharge home, recommend the following: Assistance with cooking/housework;Assist for transportation;A lot of help with walking and/or transfers;A lot of help with bathing/dressing/bathroom;Help with stairs or ramp for entrance   Can travel by private vehicle     Yes  Equipment Recommendations  Rolling walker (2 wheels)    Recommendations for Other Services       Precautions / Restrictions Precautions Precautions: Fall Recall of Precautions/Restrictions: Intact     Mobility  Bed Mobility Overal bed mobility: Needs Assistance Bed Mobility: Supine to Sit     Supine to sit: HOB elevated, Used rails, Min assist     General bed mobility comments: increased time    Transfers Overall transfer level: Needs assistance Equipment used: Rolling walker (2 wheels) Transfers: Sit to/from Stand Sit to Stand: Mod assist           General transfer comment: STS x 3 trials from EOB with RW    Ambulation/Gait Ambulation/Gait assistance: Min assist Gait Distance (Feet): 10 Feet Assistive device: Rolling walker (2 wheels) Gait Pattern/deviations: Step-through pattern, Decreased stride length, Trunk flexed Gait velocity: decreased   Pre-gait activities: marching in place with RW bedside General Gait Details: Pt in ICU. Gait ditance limited by lines.   Stairs              Wheelchair Mobility     Tilt Bed    Modified Rankin (Stroke Patients Only)       Balance Overall balance assessment: Needs assistance Sitting-balance support: Feet supported, No upper extremity supported Sitting balance-Leahy Scale: Good     Standing balance support: Bilateral upper extremity supported, During functional activity, Reliant on assistive device for balance Standing balance-Leahy Scale: Poor                              Communication Communication Communication: No apparent difficulties  Cognition Arousal: Alert Behavior During Therapy: WFL for tasks assessed/performed   PT - Cognitive impairments: No apparent impairments                         Following commands: Intact      Cueing Cueing Techniques: Verbal cues, Tactile cues  Exercises General Exercises - Lower Extremity Ankle Circles/Pumps: AROM, Both, Supine, 10 reps Gluteal Sets: AROM, Both, 10 reps, Supine Heel Slides: AROM, Right, Left, 5 reps, Supine Hip ABduction/ADduction: AROM, Both, 5 reps, Supine Hip Flexion/Marching: AROM, Right, Left, 10 reps, Seated, Standing    General Comments General comments (skin integrity, edema, etc.): VSS on 3L      Pertinent Vitals/Pain Pain Assessment Pain Assessment: No/denies pain    Home Living  Prior Function            PT Goals (current goals can now be found in the care plan section) Acute Rehab PT Goals Patient Stated Goal: home Progress towards PT goals: Progressing toward goals    Frequency    Min 2X/week      PT Plan      Co-evaluation              AM-PAC PT 6 Clicks Mobility   Outcome Measure  Help needed turning from your back to your side while in a flat bed without using bedrails?: A Little Help needed moving from lying on your back to sitting on the side of a flat bed without using bedrails?: A Lot Help needed moving to and from a bed to a chair  (including a wheelchair)?: A Little Help needed standing up from a chair using your arms (e.g., wheelchair or bedside chair)?: A Lot Help needed to walk in hospital room?: A Little Help needed climbing 3-5 steps with a railing? : Total 6 Click Score: 14    End of Session Equipment Utilized During Treatment: Gait belt;Oxygen Activity Tolerance: Patient tolerated treatment well Patient left: in chair;with call bell/phone within reach;with nursing/sitter in room Nurse Communication: Mobility status PT Visit Diagnosis: Unsteadiness on feet (R26.81);Other abnormalities of gait and mobility (R26.89);Muscle weakness (generalized) (M62.81)     Time: 9164-9096 PT Time Calculation (min) (ACUTE ONLY): 28 min  Charges:    $Gait Training: 8-22 mins $Therapeutic Exercise: 8-22 mins PT General Charges $$ ACUTE PT VISIT: 1 Visit                     Sari MATSU., PT  Office # [phone]    Erven Sari Shaker 12/14/2023, 9:56 AM

## 2023-12-14 NOTE — Progress Notes (Signed)
 Daily Progress Note   Patient Name: Robert Ramirez       Date: 12/14/2023 DOB: 1945-12-27  Age: 78 y.o. MRN#: [id number] Attending Physician: Fairy Frames, MD Primary Care Physician: Lorren Greig PARAS, NP Admit Date: 12/07/2023  Reason for Consultation/Follow-up: Establishing goals of care  Length of Stay: 7  Current Medications: Scheduled Meds:   [START ON 12/15/2023] aspirin   81 mg Oral Daily   [START ON 12/15/2023] atorvastatin   40 mg Oral Daily   Chlorhexidine  Gluconate Cloth  6 each Topical Daily   enoxaparin  (LOVENOX ) injection  40 mg Subcutaneous Q24H   [START ON 12/15/2023] ezetimibe   10 mg Oral Daily   feeding supplement (PROSource TF20)  60 mL Per Tube Daily   free water   50 mL Per Tube Q4H   insulin  aspart  0-20 Units Subcutaneous Q4H   [START ON 12/15/2023] insulin  glargine-yfgn  15 Units Subcutaneous Daily   ipratropium-albuterol   3 mL Nebulization Q6H   [START ON 12/15/2023] levothyroxine   50 mcg Oral Q0600   liver oil-zinc  oxide   Topical BID   [START ON 12/15/2023] multivitamin with minerals  1 tablet Oral Daily   nicotine   14 mg Transdermal Daily   mouth rinse  15 mL Mouth Rinse 4 times per day   phosphorus  500 mg Oral Q4H   sodium chloride  flush  3 mL Intravenous Q12H   [START ON 12/15/2023] thiamine   100 mg Oral Daily    Continuous Infusions:  feeding supplement (VITAL 1.5 CAL) 50 mL/hr at 12/14/23 1327    PRN Meds: acetaminophen  (TYLENOL ) oral liquid 160 mg/5 mL, acetaminophen  **OR** acetaminophen , docusate, ipratropium-albuterol , mouth rinse, polyethylene glycol  Physical Exam Vitals reviewed.  Constitutional:      General: He is not in acute distress.    Appearance: He is ill-appearing.     Interventions: Nasal cannula in place.  Cardiovascular:     Rate  and Rhythm: Normal rate.  Pulmonary:     Effort: Tachypnea present.  Neurological:     Mental Status: He is oriented to person, place, and time.  Psychiatric:        Mood and Affect: Mood normal.        Behavior: Behavior normal.             Vital Signs: BP (!) 116/90   Pulse 80  Temp 97.9 F (36.6 C) (Axillary)   Resp (!) 25   Ht 6' 1 (1.854 m)   Wt 77.9 kg   SpO2 91%   BMI 22.66 kg/m  SpO2: SpO2: 91 % O2 Device: O2 Device: Nasal Cannula O2 Flow Rate: O2 Flow Rate (L/min): 3 L/min    Patient Active Problem List   Diagnosis Date Noted   (HFpEF) heart failure with preserved ejection fraction (HCC) 12/07/2023   Cellulitis 12/07/2023   Elevated troponin 12/07/2023   SIRS (systemic inflammatory response syndrome) (HCC) 12/07/2023   Uncontrolled type 2 diabetes mellitus with hyperglycemia, with long-term current use of insulin  (HCC) 12/07/2023   Acute kidney injury superimposed on chronic kidney disease (HCC) 12/07/2023   History of permanent cardiac pacemaker placement 12/07/2023   OSA treated with BiPAP 12/07/2023   Acute on chronic right-sided heart failure (HCC) 12/07/2023   AV block 06/12/2023   Complete heart block (HCC) 05/28/2023   Poorly-controlled hypertension 04/05/2019   Conductive hearing loss 04/05/2019   Chewing tobacco use 04/05/2019   Bilateral impacted cerumen 04/05/2019   Primary hyperparathyroidism (HCC) 11/21/2018   Chronic respiratory failure with hypoxia and hypercapnia (HCC) 11/18/2018   Chronic obstructive asthma (with obstructive pulmonary disease) (HCC) 06/09/2018   Other secondary pulmonary hypertension (HCC) 06/09/2018   Healthcare maintenance 03/30/2018   Serum total bilirubin elevated 03/26/2018   Asbestos exposure 03/26/2018   Lesion of skin of scalp 03/26/2018   Coronary artery disease involving native coronary artery of native heart without angina pectoris 03/26/2018   Acquired hypothyroidism 03/26/2018   Pleural plaque 03/14/2018    Diabetes (HCC) 03/14/2018   Somnolence    Hyponatremia    Abnormal chest x-ray    Hypertension associated with diabetes (HCC) 07/18/2014   Mixed hyperlipidemia 07/18/2014   Diabetic retinopathy associated with type 2 diabetes mellitus (HCC) 07/18/2014    Palliative Care Assessment & Plan   Patient Profile: 78 y.o. male  with past medical history of HLD, HTN, CAD, HFpEF, complete heart block s/p pacemaker admitted on 12/07/2023 with weakness, SOB, lower extremity edema. He was admitted and treatment started for LE cellulitis, and heart failure. ECHO this admission showed EF 60-65% and enlarged RV. 7/15 he had hypotension and received multiple fluid boluses. Right heart cath performed 7/16. Recovery is complicated due to difficulty tolerating diuresis with  hypotension and worsening creatinine. He has also had poor mental status likely related to hypercarbia and/or delirium. May require CRRT. He is on milrinone  and norepinephrine . Prognosis is poor with end stage R heart failure and pulmonary hypertension. Palliative consulted for GOC.   Today's Discussion: Chart reviewed and updates received from attending provider. Patient had modified barium study today. He is no longer NPO but on a pureed and nectar thick diet. Patient worked with PT. Patient to be transferred from the ICU today.   Patient sitting up in bed alert and oriented. He is in no apparent distress and reports he feels okay. He is on nasal cannula. Patient's wife and son at bedside. Patient and his family are glad he is improving. They are hopeful he will continue to have meaningful recovery. We discussed the importance of continued BiPAP use. Son shared moving forward the patient should not have issues with using his BiPAP because they have a new set up at home. (At some point his insurance no longer covered his BiPAP and he went without it 2 weeks.  Discussed advanced directives with patient and family. Discussed code status.  Recommended consideration of DNR status,  understanding evidenced-based poor outcomes in similar hospitalized patients, as the cause of the arrest is likely associated with chronic/terminal disease rather than a reversible acute cardio-pulmonary event. Patient wants to remain full code. Discussed scopes of care and patient wants to remain full scope. He says he wants to do everything he can to stay alive.  We discussed the importance of readdressing goals of care as needed.   Emotional support and therapeutic listening provided. Left Hard Choices booklet for review. PMT will continue to support. Patient's family agreed to PMT checking in this week.   Recommendations/Plan: Full code Full scope Continued time for outcomes PMT to support- will check in midweek. If needs arise before this check in please call PMT [phone]    Code Status:    Code Status Orders  (From admission, onward)           Start     Ordered   12/09/23 1403  Full code  Continuous       Question:  By:  Answer:  Default: patient does not have capacity for decision making, no surrogate or prior directive available   12/09/23 1405         Extensive chart review has been completed prior to seeing the patient including labs, vital signs, imaging, progress/consult notes, orders, medications, and available advance directive documents.  Care plan was discussed with Dr. Fairy and bedside RN  Time spent: 35 minutes  Thank you for allowing the Palliative Medicine Team to assist in the care of this patient.    Stephane CHRISTELLA Palin, NP  Please contact Palliative Medicine Team phone at [phone] for questions and concerns.

## 2023-12-14 NOTE — Progress Notes (Signed)
 Advanced Heart Failure Rounding Note  Cardiologist: Lurena MARLA Red, MD   Chief Complaint: RV Failure / HFpEF / PH  Subjective:    7/16: RHC - RA 16, PA mean 34, PCWP 11, TD CO/CI 3.8/1.8, unable to read Fick, PVR 6.0, PAPi 1.4.  Post cath transferred to ICU for hypercapneic respiratory failure. PCO2 > 100.  Weaned off milrinone  and Norepi over the weekend.  Has been noncompliant with BiPAP.  Still with cognitive deficits.  Getting ready to go for swallow evaluation d/t concern for aspiration.  Sitting up in chair. Reports feeling well.   Objective:    Weight Range: 77.9 kg Body mass index is 22.66 kg/m.   Vital Signs:   Temp:  [97.4 F (36.3 C)-98.6 F (37 C)] 97.4 F (36.3 C) (07/21 0702) Pulse Rate:  [77-96] 82 (07/21 0800) Resp:  [8-37] 28 (07/21 0800) BP: (85-148)/(50-78) 111/50 (07/21 0702) SpO2:  [95 %-100 %] 99 % (07/21 0800) Weight:  [77.9 kg] 77.9 kg (07/21 0500) Last BM Date : 12/09/23  Weight change: Filed Weights   12/12/23 0708 12/13/23 0409 12/14/23 0500  Weight: 82.2 kg 81.5 kg 77.9 kg   Intake/Output:  Intake/Output Summary (Last 24 hours) at 12/14/2023 0800 Last data filed at 12/14/2023 0700 Gross per 24 hour  Intake 756.81 ml  Output 1950 ml  Net -1193.19 ml    Physical Exam    General:  Chronically ill appearing.  Cor: JVP ~ 7 cm Regular rate & rhythm. No murmurs. Lungs: diminished Abdomen: soft, nontender, nondistended.  Extremities: trace edema, + compression stockings    Telemetry   V paced 80s   Labs    CBC Recent Labs    12/11/23 1017  HGB 15.6  HCT 46.0   Basic Metabolic Panel Recent Labs    92/80/74 0500 12/13/23 0345 12/14/23 0350  NA 136 140 143  K 3.6 4.1 4.1  CL 83* 89* 91*  CO2 38* 41* 43*  GLUCOSE 221* 174* 175*  BUN 59* 62* 53*  CREATININE 1.57* 1.40* 1.11  CALCIUM  9.7 10.0 10.3  MG 2.3 2.3  --   PHOS 3.1 3.1 1.7*   Liver Function Tests No results for input(s): AST, ALT, ALKPHOS,  BILITOT, PROT, ALBUMIN in the last 72 hours.  BNP (last 3 results) Recent Labs    12/07/23 0816  BNP 1,195.7*   Thyroid  Function Tests No results for input(s): TSH, T4TOTAL, T3FREE, THYROIDAB in the last 72 hours.  Invalid input(s): FREET3  Medications:    Scheduled Medications:  aspirin   81 mg Per Tube Daily   atorvastatin   40 mg Per Tube Daily   Chlorhexidine  Gluconate Cloth  6 each Topical Daily   enoxaparin  (LOVENOX ) injection  40 mg Subcutaneous Q24H   ezetimibe   10 mg Per Tube Daily   feeding supplement (PROSource TF20)  60 mL Per Tube Daily   insulin  aspart  0-20 Units Subcutaneous Q4H   insulin  glargine-yfgn  10 Units Subcutaneous Daily   ipratropium-albuterol   3 mL Nebulization Q6H   levothyroxine   50 mcg Per Tube Q0600   liver oil-zinc  oxide   Topical BID   multivitamin with minerals  1 tablet Per Tube Daily   nicotine   14 mg Transdermal Daily   mouth rinse  15 mL Mouth Rinse 4 times per day   phosphorus  500 mg Per Tube Q4H   sodium chloride  flush  3 mL Intravenous Q12H   thiamine   100 mg Per Tube Daily   Infusions:  amiodarone  30 mg/hr (12/14/23 0700)   feeding supplement (VITAL 1.5 CAL) 60 mL/hr at 12/14/23 0700   PRN Medications: acetaminophen  (TYLENOL ) oral liquid 160 mg/5 mL, acetaminophen  **OR** acetaminophen , docusate, ipratropium-albuterol , mouth rinse, polyethylene glycol  Patient Profile   78 y.o. male with history of CAD, HFpEF with RV failure, DM, HTN, HLD, OSA and chronic respiratory failure on BiPAP, CHB s/p dual chamber PPM with LBBAP lead in 01/25, hx asbestos exposure with pleural plaque on prior HRCT but no ILD, remote tobacco use.   Admitted with acute on chronic HFpEF with RV failure.  Assessment/Plan   Acute on chronic HFpEF with RV failure >> cardiogenic shock Pulmonary hypertension - Evidence of RV dysfunction on echo dating back to 2019.  - L/RHC in 02/2018: Nonobstructive CAD involving RCA, PCWP mean 8, PA 46/16  (26), Fick CO/CI 4.45/2.07, TD CO/CI 3.64/1.69.  - Echo with LVEF 60-65%, interventricular septum is flattened, RV enlarged and HK, RVSP 58 mmHg, moderate TR - RHC 12/09/23: RA 16, PA mean 34, PCWP 11, TD CO/CI 3.8/1.8, unable to read Fick, PVR 6.0, PAPi 1.4 - Given severe CO2 retention suspect most WHO Group 3 disease but may have component of WHO Group 1. Reports adherence with home BiPAP, not compliant here - PFTs in 2020 with mixed restrictive and obstructive defect. CT w/o evidence of ILD. - Appears end stage. Now off NE and milrinone .  - Volume looks okay. Will start po Torsemide  20 mg daily.  - Start spiro 25 mg daily  2. AKI on CKD IIIa - Scr baseline 1.3-1.4, peaked 2.5. Resolved. Scr back to baseline. - In setting of low-output/end stage PAH/cor pulmonale   3. Acute on chronic Hypercapneic respiratory failure - pCO2 climbing again.  - Not compliant with BiPAP  4. LE cellulitis cellulitis - Completed abx  5. CHB s/p dual chamber PPM w/ LBBAP lead Possible atrial fibrillation - Device interrogated by [employer] rep. Several very brief runs of atrial flutter noted, just seconds in duration.  - Reviewed tele from last few days. Sinus rhythm with PACs. - Stop amiodarone  now that he is off inotropes.   6. Hyperbilirubinemia - T bili up to 3 -> 1.0  - RUQ US  w/o cirrhosis - improved with RV unloading  7. 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 PVCs/NSVT - keep Mg > 2.0 K > 4.0  GOC: Remains full code -Overall poor prognosis due to hypercapneic resp failure and PAH/corpulmonale -Palliative Care consulted -Family currently wants to continue with full scope of treatment  HF will sign off at this time. Will arrange outpatient f/u.     Length of Stay: 7  Robert Escue N, PA-C  12/14/2023, 8:00 AM  Advanced Heart Failure Team Pager [phone] (M-F; 7a - 5p)  Please contact [facility] for  night-coverage after hours (5p -7a ) and weekends on [email or web address]

## 2023-12-14 NOTE — Progress Notes (Signed)
 Speech Language Pathology Treatment: Dysphagia  Patient Details Name: Robert Ramirez MRN: [id number] DOB: 11-Mar-1946 Today's Date: 12/14/2023 Time: 9077-9064 SLP Time Calculation (min) (ACUTE ONLY): 13 min  Assessment / Plan / Recommendation Clinical Impression  Pt is much more alert today with mentation appropriate throughout PO trials. He is eager for POs but responds well to Min cues to take smaller bites/sips. Although outwardly he appears to swallow swiftly and his vocal quality remains clear, he has a consistent cough noted after each trial of thin liquids. This is not observed with thicker viscosities (purees) and is not not eliminated with smaller sips or by changing delivery methods (cup vs straw). Although his mentation has improved enough to be ready for POs, given s/s of possible aspiration as well as respiratory status this admission, recommend proceeding with MBS to better evaluate swallowing prior to initiating PO diet. Would offer ice chips after oral care until this can be completed.    HPI HPI: Pt is a 78 y.o. M presenting to [facility] on 12/07[facility]23 w/ leg weakness and swelling. Admitted for heart failure, pulmonary HTN, and cellulitis. Pt was initially on heart healthy diet but was made NPO 7/16 for RHC and kept NPO due to AMS and respiratory failure. PMH is significant for HTN, HLD, HFpEF, heart block s/p PPM, and DMT2.      SLP Plan  MBS          Recommendations  Diet recommendations: NPO;Other(comment) (ice chips after oral care) Medication Administration: Crushed with puree                  Oral care QID;Oral care prior to ice chip/H20     Dysphagia, unspecified (R13.10)     MBS     Leita SAILOR., M.A. CCC-SLP Acute Rehabilitation Services Office: [phone]  Secure chat preferred   12/14/2023, 9:40 AM

## 2023-12-14 NOTE — Progress Notes (Signed)
 Nutrition Follow-up  DOCUMENTATION CODES:  Severe malnutrition in context of chronic illness  INTERVENTION:  DYS2 nectar thick liquids per SLP recommendations Ordering assistance Continue tube feeding via cortrak. Will decrease rate slightly to meet protein needs but slightly under on kcal to determine if PO can make up difference. Will adjust or discontinue tube feeding pending intake trends. Vital 1.5 at 50 ml/h (1200 ml per day) Prosource TF20 60 ml 1x/d Free water : 50mL every 4 hours Provides 1880 kcal, 101 gm protein, 917 ml free water  daily (TF+flush = free water ) Multivitamin with minerals daily, and continue thiamine  through 7 days  NUTRITION DIAGNOSIS:  Severe Malnutrition related to chronic illness (CHF) as evidenced by severe fat depletion, severe muscle depletion. - new dx established 7/21  GOAL:  Patient will meet greater than or equal to 90% of their needs - progressing, TF in place, MBS today  MONITOR:  TF tolerance, Labs, Weight trends, Diet advancement  REASON FOR ASSESSMENT:  Consult Assessment of nutrition requirement/status  ASSESSMENT:  Pt with medical history significant of hypertension, hyperlipidemia, HFpEF, heart block s/p PPM, and diabetes mellitus type 2  presents with leg weakness and swelling.  7/14 - presented to ED 7/16 - right heart cath, transferred to ICU post-procedure with respiratory distress 7/18 - cortrak placed  7/19 - SLP BSE, NPO with spoonfuls of water  and ice chips 7/21 - SLP BSE, NPO but taking for MBS to determine aspiration risk.   Pt awake and alert at the time of assessment. Sitting in bedside chair and conversant. Pt going downstairs for MBS after lunch since he is more awake today. Voice sounds a little wet and SLP noted a cough after PO intake.   Pt endorses a good appetite at home. Eats 2 meals and will sometimes have a snack. Pt states that overtime he has started eating less because he didn't feel hungry. Reports  his energy has not been affected during this. Inquired about weight and pt reports a usual weight of 210 lbs but that he has lost a lot over the last 6 months or so. Does think some of this has been related to his difficulty breathing.   Pt does not appear to have weighed 210 lbs in >2 years. Do note ~6% weight loss in the last 6.5 months which is not severe, but concerning due to his age and comorbidities. Based on muscle and fat loss, pt does have a degree of malnutrition present.   Pt denies baseline chewing or swallowing issues.   Admit weight: 85.8 kg (first measured 7/16)  Current weight: 77.9 kg (7/21)   Intake/Output Summary (Last 24 hours) at 12/14/2023 1216 Last data filed at 12/14/2023 1100 Gross per 24 hour  Intake 1146.76 ml  Output 2650 ml  Net -1503.24 ml  Net IO Since Admission: -4,978.54 mL [12/14/23 1216]  Drains/Lines: CVC Right IJ, triple lumen Cortrak (gastric) UOP x 24 hours  Average Meal Intake: 7/15: 100% intake x 1 recorded meals  Nutritionally Relevant Medications: Scheduled Meds:  atorvastatin   40 mg Per Tube Daily   ezetimibe   10 mg Per Tube Daily   PROSource TF20  60 mL Per Tube Daily   insulin  aspart  0-20 Units Subcutaneous Q4H   insulin  glargine-yfgn  10 Units Subcutaneous Daily   levothyroxine   50 mcg Per Tube Q0600   multivitamin with minerals  1 tablet Per Tube Daily   phosphorus  500 mg Per Tube Q4H   thiamine   100 mg Per Tube  Daily   Continuous Infusions:  feeding supplement (VITAL 1.5 CAL) 60 mL/hr at 12/14/23 0800   PRN Meds: docusate, polyethylene glycol  Labs Reviewed: Chloride 91 BUN 53 Phosphoris 1.7 CBG ranges from 154-253 mg/dL over the last 24 hours HgbA1c 8.0% (09/30/23)  NUTRITION - FOCUSED PHYSICAL EXAM: Flowsheet Row Most Recent Value  Orbital Region Severe depletion  Upper Arm Region Severe depletion  Thoracic and Lumbar Region Moderate depletion  Buccal Region Severe depletion  Temple Region Severe  depletion  Clavicle Bone Region Severe depletion  Clavicle and Acromion Bone Region Severe depletion  Scapular Bone Region Moderate depletion  Dorsal Hand Severe depletion  Patellar Region Severe depletion  Anterior Thigh Region Severe depletion  Posterior Calf Region Severe depletion  Edema (RD Assessment) None  Hair Reviewed  Eyes Reviewed  Mouth Reviewed  Skin Reviewed  Nails Reviewed    Diet Order:   Diet Order             Diet NPO time specified  Diet effective now                   EDUCATION NEEDS:  Education needs have been addressed  Skin:  Skin Assessment: Skin Integrity Issues: Skin Integrity Issues:: Stage I, Unstageable Stage I: coccyx Unstageable: lt anterior toe  Last BM:  7/20  Height:  Ht Readings from Last 1 Encounters:  12/07/23 6' 1 (1.854 m)    Weight:  Wt Readings from Last 1 Encounters:  12/14/23 77.9 kg    Ideal Body Weight:  83.6 kg  BMI:  Body mass index is 22.66 kg/m.  Estimated Nutritional Needs:  Kcal:  2200-2400 Protein:  100-115g/d Fluid:  2.0-2.2 L    Vernell Lukes, RD, LDN, CNSC Registered Dietitian II Please reach out via secure chat

## 2023-12-15 DIAGNOSIS — I5033 Acute on chronic diastolic (congestive) heart failure: Secondary | ICD-10-CM | POA: Diagnosis not present

## 2023-12-15 LAB — BASIC METABOLIC PANEL WITH GFR
BUN: 49 mg/dL — ABNORMAL HIGH (ref 8–23)
CO2: >45 mmol/L — ABNORMAL HIGH (ref 22–32)
Calcium: 10.2 mg/dL (ref 8.9–10.3)
Chloride: 88 mmol/L — ABNORMAL LOW (ref 98–111)
Creatinine, Ser: 1.12 mg/dL (ref 0.61–1.24)
GFR, Estimated: >60 mL/min (ref >60)
Glucose, Bld: 190 mg/dL — ABNORMAL HIGH (ref 70–99)
Potassium: 3.8 mmol/L (ref 3.5–5.1)
Sodium: 144 mmol/L (ref 135–145)

## 2023-12-15 LAB — GLUCOSE, CAPILLARY
Glucose-Capillary: 187 mg/dL — ABNORMAL HIGH (ref 70–99)
Glucose-Capillary: 225 mg/dL — ABNORMAL HIGH (ref 70–99)
Glucose-Capillary: 299 mg/dL — ABNORMAL HIGH (ref 70–99)
Glucose-Capillary: 276 mg/dL — ABNORMAL HIGH (ref 70–99)
Glucose-Capillary: 280 mg/dL — ABNORMAL HIGH (ref 70–99)
Glucose-Capillary: 196 mg/dL — ABNORMAL HIGH (ref 70–99)

## 2023-12-15 LAB — CBC
HCT: 49 % (ref 39.0–52.0)
Hemoglobin: 14.6 g/dL (ref 13.0–17.0)
MCH: 28.6 pg (ref 26.0–34.0)
MCHC: 29.8 g/dL — ABNORMAL LOW (ref 30.0–36.0)
MCV: 96.1 fL (ref 80.0–100.0)
Platelets: 151 K/uL (ref 150–400)
RBC: 5.1 MIL/uL (ref 4.22–5.81)
RDW: 13.4 % (ref 11.5–15.5)
WBC: 8.2 K/uL (ref 4.0–10.5)
nRBC: 0 % (ref 0.0–0.2)

## 2023-12-15 MED ORDER — ACETAMINOPHEN 160 MG/5ML PO SOLN: 650.0000 mg | Freq: Four times a day (QID) | ORAL | Status: DC | PRN

## 2023-12-15 MED ORDER — IPRATROPIUM-ALBUTEROL 0.5-2.5 (3) MG/3ML IN SOLN
3.0000 mL | Freq: Two times a day (BID) | RESPIRATORY_TRACT | Status: DC
  Administered 2023-12-16: 3 mL via RESPIRATORY_TRACT
  Filled 2023-12-15: qty 3

## 2023-12-15 MED ORDER — INSULIN ASPART 100 UNIT/ML IJ SOLN
3.0000 [IU] | Freq: Three times a day (TID) | INTRAMUSCULAR | Status: DC
  Administered 2023-12-15: 3 [IU] via SUBCUTANEOUS

## 2023-12-15 NOTE — Progress Notes (Signed)
 PROGRESS NOTE    Robert Ramirez  FMW:[id number] DOB: November 10, 1945 DOA: 12/07/2023 PCP: Lorren Greig PARAS, NP  78/M with history of type 2 diabetes mellitus, CAD, diastolic CHF, RV failure, OSA/OHS on BiPAP, PPM, history of asbestos exposure with pleural plaque, presented to the ED with lower extremity edema and weakness, admitted with CHF exacerbation and cellulitis - Cards following, diuresed, Rx w antibiotics as well - 7/16 complicated by worsening AKI, lethargy, vancomycin  discontinued - RHC 7/16 noted RA pressure of 16, PA mean 34, wedge 11, cardiac index 1.8> ABG with severe respiratory acidosis, hypercarbia> transferred to ICU,> BiPAP - Treated with diuretics, norepinephrine  and milrinone  - Stabilizing, now weaned off pressors - Transferred back from PCCM to TRH service 7/21 - Palliative following, patient and family request full code and full scope - SLP following, diet being advanced   Subjective: - Did not use BiPAP last night, reports that hospital machine does not work for him  Assessment and Plan:  Acute on chronic HFpEF, RV failure, pulmonary HTN:  -Echo with EF 60-65%, flattened IV septum, RV enlarged, RVSP 58, moderate TR -CHF team following, volume status improving - RHC 7/16 noted RA pressure of 16, PA mean 48/26, wedge 11, cardiac index 1.8 - Appears euvolemic, diuretics on hold, now off milrinone  and norepinephrine  - Followed by heart failure team, not a candidate for advanced therapies, unlikely to have benefit from oral vasodilators -They will arrange follow-up in pulmonary hypertension clinic, transition to oral diuretics - PT OT eval completed, SNF recommended  Toxic encephalopathy, hypercarbia Delirium - ABG 7/16 noted severe respiratory acidosis, improved with BiPAP -Unfortunately with poor compliance again, emphasized -BIPAP at bedtime is critical> did not use BiPAP again last night, requested patient to have family bring his home machine  Dysphagia -Mental  status improving, hopefully can remove core track today -SLP following, now advance to dysphagia 2 diet   Sepsis due to cellulitis of RLE: - Improved, now off antibiotics   Type 2 diabetes mellitus - CBGs elevated, increase glargine   CHB s/p PPM:    Hypothyroidism: TSH wnl.  - Continue synthroid .    AKI on stage IIIa CKD:  - Likely cardiorenal, resolved   CAD, HLD: Troponin elevation but no chest pain.  - Continue statin/zetia    Chronic respiratory failure OSA OHS History of asbestos exposure and known pleural plaques - Continue BiPAP qHS   DVT prophylaxis: Lovenox  Code Status: Full code Family Communication: None present, will update spouse Disposition Plan: Transfer out of ICU  Consultants: CHF team   Objective: Vitals:   12/15/23 0706 12/15/23 0800 12/15/23 0900 12/15/23 1000  BP:  (!) 130/112  124/68  Pulse: 86 91 (!) 102 85  Resp: (!) 36 (!) 24 17 (!) 25  Temp: 98.1 F (36.7 C)     TempSrc: Oral     SpO2: 96% 95% 95% 97%  Weight:      Height:        Intake/Output Summary (Last 24 hours) at 12/15/2023 1116 Last data filed at 12/15/2023 1000 Gross per 24 hour  Intake 1969.86 ml  Output 3050 ml  Net -1080.14 ml   Filed Weights   12/13/23 0409 12/14/23 0500 12/15/23 0345  Weight: 81.5 kg 77.9 kg 73.9 kg    Examination:  General exam: Elderly chronically ill, AAO x 2, mild cognitive deficits HEENT: no JVD, core track with tube feeds infusing, central line Respiratory system: Decreased sounds at the bases Cardiovascular system: S1 & S2 heard, RRR.  Abd: nondistended,  soft and nontender.Normal bowel sounds heard. Central nervous system: Awake, no localizing signs Extremities: Trace edema, chronic skin changes, dressing on right lower leg Skin: As above Psychiatry: Mild somnolence    Data Reviewed:   CBC: Recent Labs  Lab 12/09/23 0242 12/09/23 1255 12/10/23 0911 12/10/23 1116 12/11/23 0722 12/11/23 1017 12/15/23 0341  WBC 14.8*  --   15.8*  --  9.4  --  8.2  NEUTROABS 12.8*  --   --   --   --   --   --   HGB 14.1   < > 14.4 15.0 14.0 15.6 14.6  HCT 46.0   < > 45.4 44.0 43.5 46.0 49.0  MCV 94.5  --  92.7  --  90.4  --  96.1  PLT 192  --  221  --  186  --  151   < > = values in this interval not displayed.   Basic Metabolic Panel: Recent Labs  Lab 12/09/23 0242 12/09/23 1255 12/10/23 0020 12/10/23 1116 12/11/23 0350 12/11/23 1017 12/12/23 0500 12/13/23 0345 12/14/23 0350 12/15/23 0341  NA 134*   < > 133*   < > 135 134* 136 140 143 144  K 4.3   < > 3.6   < > 3.1* 3.4* 3.6 4.1 4.1 3.8  CL 91*  --  92*  --  87*  --  83* 89* 91* 88*  CO2 33*  --  29  --  35*  --  38* 41* 43* >45*  GLUCOSE 221*  --  196*  --  140*  --  221* 174* 175* 190*  BUN 60*  --  67*  --  59*  --  59* 62* 53* 49*  CREATININE 2.33*  --  2.48*  --  1.93*  --  1.57* 1.40* 1.11 1.12  CALCIUM  9.7  --  9.5  --  10.2  --  9.7 10.0 10.3 10.2  MG 2.0  --  1.8  --  1.8  --  2.3 2.3  --   --   PHOS  --   --   --   --  1.8*  --  3.1 3.1 1.7*  --    < > = values in this interval not displayed.   GFR: Estimated Creatinine Clearance: 58.7 mL/min (by C-G formula based on SCr of 1.12 mg/dL). Liver Function Tests: Recent Labs  Lab 12/10/23 0020  AST 22  ALT 12  ALKPHOS 114  BILITOT 1.0  PROT 5.7*  ALBUMIN 2.3*   No results for input(s): LIPASE, AMYLASE in the last 168 hours. Recent Labs  Lab 12/09/23 0934  AMMONIA 27   Coagulation Profile: No results for input(s): INR, PROTIME in the last 168 hours. Cardiac Enzymes: No results for input(s): CKTOTAL, CKMB, CKMBINDEX, TROPONINI in the last 168 hours. BNP (last 3 results) No results for input(s): PROBNP in the last 8760 hours. HbA1C: No results for input(s): HGBA1C in the last 72 hours. CBG: Recent Labs  Lab 12/14/23 1506 12/14/23 1930 12/14/23 2301 12/15/23 0302 12/15/23 0704  GLUCAP 245* 228* 174* 187* 225*   Lipid Profile: No results for input(s): CHOL,  HDL, LDLCALC, TRIG, CHOLHDL, LDLDIRECT in the last 72 hours. Thyroid  Function Tests: No results for input(s): TSH, T4TOTAL, FREET4, T3FREE, THYROIDAB in the last 72 hours.  Anemia Panel: No results for input(s): VITAMINB12, FOLATE, FERRITIN, TIBC, IRON, RETICCTPCT in the last 72 hours. Urine analysis:    Component Value Date/Time   COLORURINE YELLOW 12/07/2023 0839  APPEARANCEUR CLEAR 12/07/2023 0839   LABSPEC 1.022 12/07/2023 0839   PHURINE 5.0 12/07/2023 0839   GLUCOSEU >=500 (A) 12/07/2023 0839   HGBUR NEGATIVE 12/07/2023 0839   BILIRUBINUR NEGATIVE 12/07/2023 0839   KETONESUR NEGATIVE 12/07/2023 0839   PROTEINUR NEGATIVE 12/07/2023 0839   NITRITE NEGATIVE 12/07/2023 0839   LEUKOCYTESUR NEGATIVE 12/07/2023 0839   Sepsis Labs: @LABRCNTIP (procalcitonin:4,lacticidven:4)  ) Recent Results (from the past 240 hours)  MRSA Next Gen by PCR, Nasal     Status: None   Collection Time: 12/09/23  2:03 PM   Specimen: Nasal Mucosa; Nasal Swab  Result Value Ref Range Status   MRSA by PCR Next Gen NOT DETECTED NOT DETECTED Final    Comment: (NOTE) The GeneXpert MRSA Assay (FDA approved for NASAL specimens only), is one component of a comprehensive MRSA colonization surveillance program. It is not intended to diagnose MRSA infection nor to guide or monitor treatment for MRSA infections. Test performance is not FDA approved in patients less than 45 years old. Performed at [facility], [address], [place], [place] [address]      Radiology Studies: DG Swallowing Func-Speech Pathology Result Date: 12/14/2023 Table formatting from the original result was not included. Modified Barium Swallow Study Patient Details Name: Tollie Canada MRN: [id number] Date of Birth: 1946/04/01 Today's Date: 12/14/2023 HPI/PMH: HPI: Pt is a 78 y.o. M presenting to [facility] on 12/07/23 w/ leg weakness and swelling. Admitted for heart failure, pulmonary HTN, and cellulitis. Pt  was initially on heart healthy diet but was made NPO 7/16 for RHC and kept NPO due to AMS and respiratory failure. PMH is significant for HTN, HLD, HFpEF, heart block s/p PPM, and DMT2. Clinical Impression: Clinical Impression: Pt has a pharyngeal more than oral dysphagia with incomplete hyolaryngeal movement, epiglottic inversion, and laryngeal vestibule closure that results in incomplete airway protection during the swallow. He has deep penetration to the vocal folds (PAS 5) and silent aspiration (PAS 8) with thin liquids, which does not clear with a cued cough. He still aspirates when using a chin tuck and a supraglottic swallow helps to reduce some of the aspiration but does not fully protect the airway. Transient aspiration occurred with nectar thick liquids x1 (PAS 6) only with the barium tablet, but otherwise he only had transient penetration (PAS 2). Mild pharyngeal residue was observed throughout most of the study due to reduced base of tongue retraction and UES opening, but the barium tablet also became lodged in the valleculae. It required a bolus of puree and a chin tuck to clear. Note that pt reports having some difficulty with mastication at baseline due to lack of dentition. Recommend starting with Dys 2 (finely chopped) diet and nectar thick liquids, avoiding mixed consistencies. SLP will f/u for trial of therapy to try to improve pharyngeal function and airway protection. Factors that may increase risk of adverse event in presence of aspiration Noe & Lianne 2021): Factors that may increase risk of adverse event in presence of aspiration Noe & Lianne 2021): Poor general health and/or compromised immunity; Respiratory or GI disease; Frail or deconditioned; Weak cough; Presence of tubes (ETT, trach, NG, etc.) Recommendations/Plan: Swallowing Evaluation Recommendations Swallowing Evaluation Recommendations Recommendations: PO diet PO Diet Recommendation: Dysphagia 1 (Pureed); Mildly thick  liquids (Level 2, nectar thick) Liquid Administration via: Cup Medication Administration: Crushed with puree Supervision: Patient able to self-feed; Full supervision/cueing for swallowing strategies Swallowing strategies  : Minimize environmental distractions; Slow rate; Small bites/sips; Follow solids with liquids; Avoid  mixed consistencies Postural changes: Position pt fully upright for meals; Stay upright 30-60 min after meals Oral care recommendations: Oral care BID (2x/day) Caregiver Recommendations: Avoid jello, ice cream, thin soups, popsicles; Remove water  pitcher Treatment Plan Treatment Plan Treatment recommendations: Therapy as outlined in treatment plan below Follow-up recommendations: Skilled nursing-short term rehab (<3 hours/day) Functional status assessment: Patient has had a recent decline in their functional status and demonstrates the ability to make significant improvements in function in a reasonable and predictable amount of time. Treatment frequency: Min 2x/week Treatment duration: 2 weeks Interventions: Aspiration precaution training; Patient/family education; Trials of upgraded texture/liquids; Compensatory techniques; Oropharyngeal exercises; Respiratory muscle strength training; Diet toleration management by SLP Recommendations Recommendations for follow up therapy are one component of a multi-disciplinary discharge planning process, led by the attending physician.  Recommendations may be updated based on patient status, additional functional criteria and insurance authorization. Assessment: Orofacial Exam: Orofacial Exam Oral Cavity - Dentition: Edentulous Anatomy: Anatomy: Suspected cervical osteophytes Boluses Administered: Boluses Administered Boluses Administered: Thin liquids (Level 0); Mildly thick liquids (Level 2, nectar thick); Moderately thick liquids (Level 3, honey thick); Puree; Solid  Oral Impairment Domain: Oral Impairment Domain Lip Closure: No labial escape Tongue control  during bolus hold: Cohesive bolus between tongue to palatal seal Bolus transport/lingual motion: Brisk tongue motion Oral residue: Trace residue lining oral structures Location of oral residue : Tongue Initiation of pharyngeal swallow : Posterior angle of the ramus  Pharyngeal Impairment Domain: Pharyngeal Impairment Domain Soft palate elevation: No bolus between soft palate (SP)/pharyngeal wall (PW) Laryngeal elevation: Partial superior movement of thyroid  cartilage/partial approximation of arytenoids to epiglottic petiole Anterior hyoid excursion: Partial anterior movement Epiglottic movement: Partial inversion Laryngeal vestibule closure: Incomplete, narrow column air/contrast in laryngeal vestibule Pharyngeal stripping wave : Present - complete Pharyngeal contraction (A/P view only): N/A Pharyngoesophageal segment opening: Partial distention/partial duration, partial obstruction of flow Tongue base retraction: Trace column of contrast or air between tongue base and PPW Pharyngeal residue: Collection of residue within or on pharyngeal structures Location of pharyngeal residue: Valleculae; Pyriform sinuses  Esophageal Impairment Domain: Esophageal Impairment Domain Esophageal clearance upright position: Esophageal retention Pill: Pill Consistency administered: Mildly thick liquids (Level 2, nectar thick); Puree Mildly thick liquids (Level 2, nectar thick): Impaired (see clinical impressions) Puree: Impaired (see clinical impressions) Penetration/Aspiration Scale Score: Penetration/Aspiration Scale Score 1.  Material does not enter airway: Moderately thick liquids (Level 3, honey thick); Puree; Solid; Pill 2.  Material enters airway, remains ABOVE vocal cords then ejected out: Mildly thick liquids (Level 2, nectar thick) 6.  Material enters airway, passes BELOW cords then ejected out: Mildly thick liquids (Level 2, nectar thick) (x1 with barium tablet) 8.  Material enters airway, passes BELOW cords without attempt  by patient to eject out (silent aspiration) : Thin liquids (Level 0) Compensatory Strategies: Compensatory Strategies Compensatory strategies: Yes Straw: Ineffective Ineffective Straw: Thin liquid (Level 0) Effortful swallow: Ineffective Ineffective Effortful Swallow: Pill Chin tuck: Ineffective; Effective Effective Chin Tuck: Pill Ineffective Chin Tuck: Thin liquid (Level 0) Liquid wash: Ineffective Ineffective Liquid Wash: Pill Supraglottic swallow: Ineffective Ineffective Supraglottic Swallow: Thin liquid (Level 0)   General Information: Caregiver present: Yes Banker)  Diet Prior to this Study: NPO; Cortrak/Small bore NG tube   Temperature : Normal   Respiratory Status: WFL   Supplemental O2: Nasal cannula   History of Recent Intubation: No  Behavior/Cognition: Alert; Cooperative; Requires cueing Self-Feeding Abilities: Able to self-feed Baseline vocal quality/speech: Normal Volitional Cough: Able to elicit Volitional Swallow:  Able to elicit Exam Limitations: No limitations Goal Planning: Prognosis for improved oropharyngeal function: Good Barriers to Reach Goals: Cognitive deficits No data recorded Patient/Family Stated Goal: asking for POs Consulted and agree with results and recommendations: Patient; Nurse Pain: Pain Assessment Pain Assessment: Faces Faces Pain Scale: 0 End of Session: Start Time:SLP Start Time (ACUTE ONLY): 1207 Stop Time: SLP Stop Time (ACUTE ONLY): 1224 Time Calculation:SLP Time Calculation (min) (ACUTE ONLY): 17 min Charges: SLP Evaluations $ SLP Speech Visit: 1 Visit SLP Evaluations $MBS Swallow: 1 Procedure $Swallowing Treatment: 1 Procedure SLP visit diagnosis: SLP Visit Diagnosis: Dysphagia, pharyngeal phase (R13.13) Past Medical History: Past Medical History: Diagnosis Date  Diabetes mellitus   High cholesterol   Hypertension  Past Surgical History: Past Surgical History: Procedure Laterality Date  APPENDECTOMY    ARTERIAL LINE INSERTION N/A 12/09/2023  Procedure: ARTERIAL LINE INSERTION;   Surgeon: Cherrie Toribio SAUNDERS, MD;  Location: [facility] INVASIVE CV LAB;  Service: Cardiovascular;  Laterality: N/A;  CENTRAL LINE INSERTION  12/09/2023  Procedure: CENTRAL LINE INSERTION;  Surgeon: Cherrie Toribio SAUNDERS, MD;  Location: [facility] INVASIVE CV LAB;  Service: Cardiovascular;;  PACEMAKER IMPLANT N/A 06/12/2023  Procedure: PACEMAKER IMPLANT;  Surgeon: Kennyth Chew, MD;  Location: [facility] INVASIVE CV LAB;  Service: Cardiovascular;  Laterality: N/A;  RIGHT HEART CATH N/A 12/09/2023  Procedure: RIGHT HEART CATH;  Surgeon: Cherrie Toribio SAUNDERS, MD;  Location: [facility] INVASIVE CV LAB;  Service: Cardiovascular;  Laterality: N/A;  RIGHT/LEFT HEART CATH AND CORONARY ANGIOGRAPHY N/A 03/17/2018  Procedure: RIGHT/LEFT HEART CATH AND CORONARY ANGIOGRAPHY;  Surgeon: Claudene Pacific, MD;  Location: [facility] INVASIVE CV LAB;  Service: Cardiovascular;  Laterality: N/A; Leita SAILOR., M.A. CCC-SLP Acute Rehabilitation Services Office: [phone] Secure chat preferred 12/14/2023, 1:24 PM     Scheduled Meds:  aspirin   81 mg Oral Daily   atorvastatin   40 mg Oral Daily   Chlorhexidine  Gluconate Cloth  6 each Topical Daily   enoxaparin  (LOVENOX ) injection  40 mg Subcutaneous Q24H   ezetimibe   10 mg Oral Daily   feeding supplement (PROSource TF20)  60 mL Per Tube Daily   free water   50 mL Per Tube Q4H   insulin  aspart  0-20 Units Subcutaneous Q4H   insulin  glargine-yfgn  15 Units Subcutaneous Daily   ipratropium-albuterol   3 mL Nebulization Q6H   levothyroxine   50 mcg Oral Q0600   liver oil-zinc  oxide   Topical BID   multivitamin with minerals  1 tablet Oral Daily   nicotine   14 mg Transdermal Daily   mouth rinse  15 mL Mouth Rinse 4 times per day   sodium chloride  flush  3 mL Intravenous Q12H   spironolactone   25 mg Oral Daily   thiamine   100 mg Oral Daily   torsemide   20 mg Oral Daily   Continuous Infusions:  feeding supplement (VITAL 1.5 CAL) 50 mL/hr at 12/15/23 1000     LOS: 8 days    Time spent:    Sigurd Pac,  MD [facility]   12/15/2023, 11:16 AM

## 2023-12-15 NOTE — Inpatient Diabetes Management (Signed)
 Inpatient Diabetes Program Recommendations  AACE/ADA: New Consensus Statement on Inpatient Glycemic Control (2015)  Target Ranges:  Prepandial:   less than 140 mg/dL      Peak postprandial:   less than 180 mg/dL (1-2 hours)      Critically ill patients:  140 - 180 mg/dL   Lab Results  Component Value Date   GLUCAP 225 (H) 12/15/2023   HGBA1C 8.0 (A) 09/30/2023    Review of Glycemic Control  Latest Reference Range & Units 12/14/23 07:06 12/14/23 11:31 12/14/23 15:06 12/14/23 19:30 12/14/23 23:01 12/15/23 03:02 12/15/23 07:04  Glucose-Capillary 70 - 99 mg/dL 746 (H) 792 (H) 754 (H) 228 (H) 174 (H) 187 (H) 225 (H)   Diabetes history: DM 2 Outpatient Diabetes medications:  Jardiance  10 mg Daily, NPH 33 units bid, metformin  1000 mg bid Current orders for Inpatient glycemic control:  Semglee  15 units Daily (started 7/22) Novolog  0-20 units Q4 hours Vital 1.5 @ 50 ml/hour  Inpatient Diabetes Program Recommendations:    Note: Semglee  at 15 units today -   may consider adding Novolog  Tube Feed Coverage q4 hours if glucose trends are still elevated after semglee  dose  Thanks,  Clotilda Bull RN, MSN, BC-ADM Inpatient Diabetes Coordinator Team Pager [phone] (8a-5p)

## 2023-12-15 NOTE — Progress Notes (Signed)
 Speech Language Pathology Treatment: Dysphagia  Patient Details Name: Robert Ramirez MRN: [id number] DOB: 26-Jan-1946 Today's Date: 12/15/2023 Time: 8369-8357 SLP Time Calculation (min) (ACUTE ONLY): 12 min  Assessment / Plan / Recommendation Clinical Impression  Patient seen by SLP for skilled treatment focused on dysphagia goals. He told SLP that he has been trying the thick milk to appease others but he does not like it. Per RN, he ate well at breakfast but not lunch. She reported that he enjoyed and seemed to tolerate the prethickened juices but with the prethickened milk, he ended up with a lot of residuals in oral cavity and he had trouble swallowing it. SLP observed patient with cup sips of nectar thick juice, which he fed himself. No overt s/s aspiration and patient reported that he liked the juice and that it's tasteable. SLP will continue to follow for toleration and ability to advance with PO's.   HPI HPI: Pt is a 78 y.o. M presenting to [facility] on 12/07/23 w/ leg weakness and swelling. Admitted for heart failure, pulmonary HTN, and cellulitis. Pt was initially on heart healthy diet but was made NPO 7/16 for RHC and kept NPO due to AMS and respiratory failure. PMH is significant for HTN, HLD, HFpEF, heart block s/p PPM, and DMT2.      SLP Plan  Continue with current plan of care          Recommendations  Diet recommendations: Dysphagia 2 (fine chop);Nectar-thick liquid Liquids provided via: Cup Medication Administration: Crushed with puree Supervision: Patient able to self feed;Full supervision/cueing for compensatory strategies Compensations: Slow rate;Small sips/bites Postural Changes and/or Swallow Maneuvers: Seated upright 90 degrees                  Oral care QID;Oral care prior to ice chip/H20   Intermittent Supervision/Assistance Dysphagia, pharyngeal phase (R13.13)     Continue with current plan of care     Norleen IVAR Blase, MA, CCC-SLP Speech  Therapy

## 2023-12-15 NOTE — Progress Notes (Signed)
 Pt currently on [place]. BiPAP on stby at beside.

## 2023-12-15 NOTE — TOC Progression Note (Signed)
 Transition of Care [facility]) - Progression Note    Patient Details  Name: Robert Ramirez MRN: [id number] Date of Birth: 1945-12-25  [facility] CM/SW Contact  Lauraine FORBES Saa, LCSW Phone Number: 12/15/2023, 11:21 AM  Clinical Narrative:     11:21 AM CSW followed up with patient on SNF decision. Patient expressed preference in discharging home with [facility] vs SNF to take care of himself and spouse there. Patient stated his adult son lives with them and provides them care at home. CSW informed patient that HHPT/OT would work with patient significantly less than PT/OT at SNF (2 hours twice a week vs 5 hours Monday-Friday). Patient expressed understanding of the information on continued preference in discharging home. CSW relayed information to medical team.  Expected Discharge Plan: Home w Home Health Services Barriers to Discharge: Continued Medical Work up, English as a second language teacher, SNF Pending bed offer  Expected Discharge Plan and Services In-house Referral: Clinical Social Work Discharge Planning Services: CM Consult Post Acute Care Choice: Home Health Living arrangements for the past 2 months: Single Family Home                                       Social Determinants of Health (SDOH) Interventions SDOH Screenings   Food Insecurity: No Food Insecurity (12/08/2023)  Housing: Low Risk  (12/08/2023)  Transportation Needs: No Transportation Needs (12/08/2023)  Utilities: Not At Risk (12/08/2023)  Alcohol Screen: Low Risk  (08/06/2023)  Depression (PHQ2-9): Low Risk  (09/30/2023)  Financial Resource Strain: Low Risk  (08/06/2023)  Physical Activity: Sufficiently Active (08/06/2023)  Social Connections: Moderately Isolated (12/08/2023)  Stress: No Stress Concern Present (08/06/2023)  Tobacco Use: High Risk (12/07/2023)  Health Literacy: Adequate Health Literacy (08/06/2023)    Readmission Risk Interventions     No data to display

## 2023-12-15 NOTE — Plan of Care (Signed)
  Problem: Coping: Goal: Ability to adjust to condition or change in health will improve Outcome: Progressing   Problem: Fluid Volume: Goal: Ability to maintain a balanced intake and output will improve Outcome: Progressing   Problem: Nutritional: Goal: Maintenance of adequate nutrition will improve Outcome: Progressing

## 2023-12-15 NOTE — Progress Notes (Signed)
 I have notified patient's Robert Ramirez to bring his home bipap machine

## 2023-12-16 DIAGNOSIS — I5033 Acute on chronic diastolic (congestive) heart failure: Secondary | ICD-10-CM | POA: Diagnosis not present

## 2023-12-16 LAB — CBC
HCT: 46.8 % (ref 39.0–52.0)
Hemoglobin: 14.3 g/dL (ref 13.0–17.0)
MCH: 29.1 pg (ref 26.0–34.0)
MCHC: 30.6 g/dL (ref 30.0–36.0)
MCV: 95.3 fL (ref 80.0–100.0)
Platelets: 175 K/uL (ref 150–400)
RBC: 4.91 MIL/uL (ref 4.22–5.81)
RDW: 13.4 % (ref 11.5–15.5)
WBC: 9.8 K/uL (ref 4.0–10.5)
nRBC: 0 % (ref 0.0–0.2)

## 2023-12-16 LAB — BASIC METABOLIC PANEL WITH GFR
BUN: 54 mg/dL — ABNORMAL HIGH (ref 8–23)
CO2: >45 mmol/L — ABNORMAL HIGH (ref 22–32)
Calcium: 10.2 mg/dL (ref 8.9–10.3)
Chloride: 87 mmol/L — ABNORMAL LOW (ref 98–111)
Creatinine, Ser: 1.09 mg/dL (ref 0.61–1.24)
GFR, Estimated: >60 mL/min (ref >60)
Glucose, Bld: 218 mg/dL — ABNORMAL HIGH (ref 70–99)
Potassium: 4 mmol/L (ref 3.5–5.1)
Sodium: 142 mmol/L (ref 135–145)

## 2023-12-16 LAB — GLUCOSE, CAPILLARY
Glucose-Capillary: 207 mg/dL — ABNORMAL HIGH (ref 70–99)
Glucose-Capillary: 263 mg/dL — ABNORMAL HIGH (ref 70–99)
Glucose-Capillary: 213 mg/dL — ABNORMAL HIGH (ref 70–99)
Glucose-Capillary: 88 mg/dL (ref 70–99)
Glucose-Capillary: 296 mg/dL — ABNORMAL HIGH (ref 70–99)

## 2023-12-16 MED ORDER — INSULIN GLARGINE-YFGN 100 UNIT/ML ~~LOC~~ SOLN
20.0000 [IU] | Freq: Every day | SUBCUTANEOUS | Status: DC
  Administered 2023-12-16 – 2023-12-17 (×2): 20 [IU] via SUBCUTANEOUS
  Filled 2023-12-16 (×2): qty 0.2

## 2023-12-16 MED ORDER — INSULIN ASPART 100 UNIT/ML IJ SOLN
0.0000 [IU] | Freq: Three times a day (TID) | INTRAMUSCULAR | Status: DC
  Administered 2023-12-16: 11 [IU] via SUBCUTANEOUS
  Administered 2023-12-17: 7 [IU] via SUBCUTANEOUS
  Administered 2023-12-17: 4 [IU] via SUBCUTANEOUS
  Administered 2023-12-17: 11 [IU] via SUBCUTANEOUS
  Administered 2023-12-17: 4 [IU] via SUBCUTANEOUS
  Administered 2023-12-18: 3 [IU] via SUBCUTANEOUS
  Administered 2023-12-18: 11 [IU] via SUBCUTANEOUS
  Administered 2023-12-18: 7 [IU] via SUBCUTANEOUS
  Administered 2023-12-18: 3 [IU] via SUBCUTANEOUS
  Administered 2023-12-19: 7 [IU] via SUBCUTANEOUS

## 2023-12-16 MED ORDER — ACETAZOLAMIDE 250 MG PO TABS
250.0000 mg | ORAL_TABLET | Freq: Two times a day (BID) | ORAL | Status: DC
  Administered 2023-12-16 – 2023-12-19 (×7): 250 mg via ORAL
  Filled 2023-12-16 (×8): qty 1

## 2023-12-16 NOTE — Progress Notes (Addendum)
 PROGRESS NOTE    Robert Ramirez  FMW:[id number] DOB: Jan 25, 1946 DOA: 12/07/2023 PCP: Lorren Greig PARAS, NP  78/M with history of type 2 diabetes mellitus, CAD, diastolic CHF, RV failure, OSA/OHS on BiPAP, PPM, history of asbestos exposure with pleural plaque, presented to the ED with lower extremity edema and weakness, admitted with CHF exacerbation and cellulitis - Cards following, diuresed, Rx w antibiotics as well - 7/16 complicated by worsening AKI, lethargy, vancomycin  discontinued - RHC 7/16 noted RA pressure of 16, PA mean 34, wedge 11, cardiac index 1.8> ABG with severe respiratory acidosis, hypercarbia> transferred to ICU,> BiPAP - Treated with diuretics, norepinephrine  and milrinone  - Stabilizing, now weaned off pressors - Transferred back from PCCM to TRH service 7/21 - Palliative following, patient and family request full code and full scope - SLP following, diet being advanced - 7/23, core track removed   Subjective: - Used home BiPAP machine last night, feels better overall, tolerated dysphagia 2 diet, refusing rehab  Assessment and Plan:  Acute on chronic HFpEF, RV failure, pulmonary HTN:  -Echo with EF 60-65%, flattened IV septum, RV enlarged, RVSP 58, moderate TR -CHF team following, volume status improving - RHC 7/16 noted RA pressure of 16, PA mean 48/26, wedge 11, cardiac index 1.8 - Appears euvolemic, diuretics on hold, now off milrinone  and norepinephrine  - Followed by heart failure team, not a candidate for advanced therapies, unlikely to have benefit from oral vasodilators, cards signed off -They will arrange follow-up in pulmonary hypertension clinic, transitioned to oral diuretics -CO2 trending up, add Diamox  today - PT OT eval completed, SNF recommended, patient now refusing SNF, will likely need home health services set up  Toxic encephalopathy, hypercarbia Delirium - ABG 7/16 noted severe respiratory acidosis, improved with BiPAP -Unfortunately with poor  compliance again, emphasized -BIPAP at bedtime is critical> improved, finally home BiPAP in the room and patient was compliant last night  Dysphagia -Mental status improving, will remove core track today -SLP following, tolerating dysphagia 2 diet    Sepsis due to cellulitis of RLE: - Improved, now off antibiotics   Type 2 diabetes mellitus - CBGs elevated, increase glargine   CHB s/p PPM:    Hypothyroidism: TSH wnl.  - Continue synthroid .    AKI on stage IIIa CKD:  - Likely cardiorenal, resolved   CAD, HLD: Troponin elevation but no chest pain.  - Continue statin/zetia    Chronic respiratory failure OSA OHS History of asbestos exposure and known pleural plaques - Continue BiPAP qHS   DVT prophylaxis: Lovenox  Code Status: Full code Family Communication: None present, will update spouse Disposition Plan: Transfer out of ICU  Consultants: CHF team   Objective: Vitals:   12/16/23 0745 12/16/23 0750 12/16/23 0807 12/16/23 1104  BP:      Pulse: 93  94   Resp: 19     Temp:  98.3 F (36.8 C)  98.6 F (37 C)  TempSrc:  Axillary  Oral  SpO2: 96%  98%   Weight:      Height:        Intake/Output Summary (Last 24 hours) at 12/16/2023 1112 Last data filed at 12/16/2023 1100 Gross per 24 hour  Intake 2150 ml  Output 3400 ml  Net -1250 ml   Filed Weights   12/14/23 0500 12/15/23 0345 12/16/23 0335  Weight: 77.9 kg 73.9 kg 74.3 kg    Examination:  General exam: Elderly chronically ill, AAO x 2, mild cognitive deficits HEENT: no JVD, core track with tube feeds infusing,  central line Respiratory system: Decreased sounds at the bases Cardiovascular system: S1 & S2 heard, RRR.  Abd: nondistended, soft and nontender.Normal bowel sounds heard. Central nervous system: Awake, no localizing signs Extremities: Trace edema, chronic skin changes, dressing on right lower leg Skin: As above Psychiatry: Mild somnolence    Data Reviewed:   CBC: Recent Labs  Lab  12/10/23 0911 12/10/23 1116 12/11/23 0722 12/11/23 1017 12/15/23 0341 12/16/23 0404  WBC 15.8*  --  9.4  --  8.2 9.8  HGB 14.4 15.0 14.0 15.6 14.6 14.3  HCT 45.4 44.0 43.5 46.0 49.0 46.8  MCV 92.7  --  90.4  --  96.1 95.3  PLT 221  --  186  --  151 175   Basic Metabolic Panel: Recent Labs  Lab 12/10/23 0020 12/10/23 1116 12/11/23 0350 12/11/23 1017 12/12/23 0500 12/13/23 0345 12/14/23 0350 12/15/23 0341 12/16/23 0404  NA 133*   < > 135   < > 136 140 143 144 142  K 3.6   < > 3.1*   < > 3.6 4.1 4.1 3.8 4.0  CL 92*  --  87*  --  83* 89* 91* 88* 87*  CO2 29  --  35*  --  38* 41* 43* >45* >45*  GLUCOSE 196*  --  140*  --  221* 174* 175* 190* 218*  BUN 67*  --  59*  --  59* 62* 53* 49* 54*  CREATININE 2.48*  --  1.93*  --  1.57* 1.40* 1.11 1.12 1.09  CALCIUM  9.5  --  10.2  --  9.7 10.0 10.3 10.2 10.2  MG 1.8  --  1.8  --  2.3 2.3  --   --   --   PHOS  --   --  1.8*  --  3.1 3.1 1.7*  --   --    < > = values in this interval not displayed.   GFR: Estimated Creatinine Clearance: 60.6 mL/min (by C-G formula based on SCr of 1.09 mg/dL). Liver Function Tests: Recent Labs  Lab 12/10/23 0020  AST 22  ALT 12  ALKPHOS 114  BILITOT 1.0  PROT 5.7*  ALBUMIN 2.3*   No results for input(s): LIPASE, AMYLASE in the last 168 hours. No results for input(s): AMMONIA in the last 168 hours.  Coagulation Profile: No results for input(s): INR, PROTIME in the last 168 hours. Cardiac Enzymes: No results for input(s): CKTOTAL, CKMB, CKMBINDEX, TROPONINI in the last 168 hours. BNP (last 3 results) No results for input(s): PROBNP in the last 8760 hours. HbA1C: No results for input(s): HGBA1C in the last 72 hours. CBG: Recent Labs  Lab 12/15/23 1922 12/15/23 2302 12/16/23 0308 12/16/23 0724 12/16/23 1106  GLUCAP 280* 196* 207* 263* 213*   Lipid Profile: No results for input(s): CHOL, HDL, LDLCALC, TRIG, CHOLHDL, LDLDIRECT in the last 72  hours. Thyroid  Function Tests: No results for input(s): TSH, T4TOTAL, FREET4, T3FREE, THYROIDAB in the last 72 hours.  Anemia Panel: No results for input(s): VITAMINB12, FOLATE, FERRITIN, TIBC, IRON, RETICCTPCT in the last 72 hours. Urine analysis:    Component Value Date/Time   COLORURINE YELLOW 12/07/2023 0839   APPEARANCEUR CLEAR 12/07/2023 0839   LABSPEC 1.022 12/07/2023 0839   PHURINE 5.0 12/07/2023 0839   GLUCOSEU >=500 (A) 12/07/2023 0839   HGBUR NEGATIVE 12/07/2023 0839   BILIRUBINUR NEGATIVE 12/07/2023 0839   KETONESUR NEGATIVE 12/07/2023 0839   PROTEINUR NEGATIVE 12/07/2023 0839   NITRITE NEGATIVE 12/07/2023 0839   LEUKOCYTESUR  NEGATIVE 12/07/2023 0839   Sepsis Labs: @LABRCNTIP (procalcitonin:4,lacticidven:4)  ) Recent Results (from the past 240 hours)  MRSA Next Gen by PCR, Nasal     Status: None   Collection Time: 12/09/23  2:03 PM   Specimen: Nasal Mucosa; Nasal Swab  Result Value Ref Range Status   MRSA by PCR Next Gen NOT DETECTED NOT DETECTED Final    Comment: (NOTE) The GeneXpert MRSA Assay (FDA approved for NASAL specimens only), is one component of a comprehensive MRSA colonization surveillance program. It is not intended to diagnose MRSA infection nor to guide or monitor treatment for MRSA infections. Test performance is not FDA approved in patients less than 85 years old. Performed at [facility], [address] [place], [place] [address]      Radiology Studies: DG Swallowing Func-Speech Pathology Result Date: 12/14/2023 Table formatting from the original result was not included. Modified Barium Swallow Study Patient Details Name: Calven Gilkes MRN: [id number] Date of Birth: September 02, 1945 Today's Date: 12/14/2023 HPI/PMH: HPI: Pt is a 78 y.o. M presenting to [facility] on 12/07/23 w/ leg weakness and swelling. Admitted for heart failure, pulmonary HTN, and cellulitis. Pt was initially on heart healthy diet but was made NPO 7/16 for RHC  and kept NPO due to AMS and respiratory failure. PMH is significant for HTN, HLD, HFpEF, heart block s/p PPM, and DMT2. Clinical Impression: Clinical Impression: Pt has a pharyngeal more than oral dysphagia with incomplete hyolaryngeal movement, epiglottic inversion, and laryngeal vestibule closure that results in incomplete airway protection during the swallow. He has deep penetration to the vocal folds (PAS 5) and silent aspiration (PAS 8) with thin liquids, which does not clear with a cued cough. He still aspirates when using a chin tuck and a supraglottic swallow helps to reduce some of the aspiration but does not fully protect the airway. Transient aspiration occurred with nectar thick liquids x1 (PAS 6) only with the barium tablet, but otherwise he only had transient penetration (PAS 2). Mild pharyngeal residue was observed throughout most of the study due to reduced base of tongue retraction and UES opening, but the barium tablet also became lodged in the valleculae. It required a bolus of puree and a chin tuck to clear. Note that pt reports having some difficulty with mastication at baseline due to lack of dentition. Recommend starting with Dys 2 (finely chopped) diet and nectar thick liquids, avoiding mixed consistencies. SLP will f/u for trial of therapy to try to improve pharyngeal function and airway protection. Factors that may increase risk of adverse event in presence of aspiration Noe & Lianne 2021): Factors that may increase risk of adverse event in presence of aspiration Noe & Lianne 2021): Poor general health and/or compromised immunity; Respiratory or GI disease; Frail or deconditioned; Weak cough; Presence of tubes (ETT, trach, NG, etc.) Recommendations/Plan: Swallowing Evaluation Recommendations Swallowing Evaluation Recommendations Recommendations: PO diet PO Diet Recommendation: Dysphagia 1 (Pureed); Mildly thick liquids (Level 2, nectar thick) Liquid Administration via: Cup  Medication Administration: Crushed with puree Supervision: Patient able to self-feed; Full supervision/cueing for swallowing strategies Swallowing strategies  : Minimize environmental distractions; Slow rate; Small bites/sips; Follow solids with liquids; Avoid mixed consistencies Postural changes: Position pt fully upright for meals; Stay upright 30-60 min after meals Oral care recommendations: Oral care BID (2x/day) Caregiver Recommendations: Avoid jello, ice cream, thin soups, popsicles; Remove water  pitcher Treatment Plan Treatment Plan Treatment recommendations: Therapy as outlined in treatment plan below Follow-up recommendations: Skilled nursing-short term rehab (<3 hours/day)  Functional status assessment: Patient has had a recent decline in their functional status and demonstrates the ability to make significant improvements in function in a reasonable and predictable amount of time. Treatment frequency: Min 2x/week Treatment duration: 2 weeks Interventions: Aspiration precaution training; Patient/family education; Trials of upgraded texture/liquids; Compensatory techniques; Oropharyngeal exercises; Respiratory muscle strength training; Diet toleration management by SLP Recommendations Recommendations for follow up therapy are one component of a multi-disciplinary discharge planning process, led by the attending physician.  Recommendations may be updated based on patient status, additional functional criteria and insurance authorization. Assessment: Orofacial Exam: Orofacial Exam Oral Cavity - Dentition: Edentulous Anatomy: Anatomy: Suspected cervical osteophytes Boluses Administered: Boluses Administered Boluses Administered: Thin liquids (Level 0); Mildly thick liquids (Level 2, nectar thick); Moderately thick liquids (Level 3, honey thick); Puree; Solid  Oral Impairment Domain: Oral Impairment Domain Lip Closure: No labial escape Tongue control during bolus hold: Cohesive bolus between tongue to palatal  seal Bolus transport/lingual motion: Brisk tongue motion Oral residue: Trace residue lining oral structures Location of oral residue : Tongue Initiation of pharyngeal swallow : Posterior angle of the ramus  Pharyngeal Impairment Domain: Pharyngeal Impairment Domain Soft palate elevation: No bolus between soft palate (SP)/pharyngeal wall (PW) Laryngeal elevation: Partial superior movement of thyroid  cartilage/partial approximation of arytenoids to epiglottic petiole Anterior hyoid excursion: Partial anterior movement Epiglottic movement: Partial inversion Laryngeal vestibule closure: Incomplete, narrow column air/contrast in laryngeal vestibule Pharyngeal stripping wave : Present - complete Pharyngeal contraction (A/P view only): N/A Pharyngoesophageal segment opening: Partial distention/partial duration, partial obstruction of flow Tongue base retraction: Trace column of contrast or air between tongue base and PPW Pharyngeal residue: Collection of residue within or on pharyngeal structures Location of pharyngeal residue: Valleculae; Pyriform sinuses  Esophageal Impairment Domain: Esophageal Impairment Domain Esophageal clearance upright position: Esophageal retention Pill: Pill Consistency administered: Mildly thick liquids (Level 2, nectar thick); Puree Mildly thick liquids (Level 2, nectar thick): Impaired (see clinical impressions) Puree: Impaired (see clinical impressions) Penetration/Aspiration Scale Score: Penetration/Aspiration Scale Score 1.  Material does not enter airway: Moderately thick liquids (Level 3, honey thick); Puree; Solid; Pill 2.  Material enters airway, remains ABOVE vocal cords then ejected out: Mildly thick liquids (Level 2, nectar thick) 6.  Material enters airway, passes BELOW cords then ejected out: Mildly thick liquids (Level 2, nectar thick) (x1 with barium tablet) 8.  Material enters airway, passes BELOW cords without attempt by patient to eject out (silent aspiration) : Thin liquids  (Level 0) Compensatory Strategies: Compensatory Strategies Compensatory strategies: Yes Straw: Ineffective Ineffective Straw: Thin liquid (Level 0) Effortful swallow: Ineffective Ineffective Effortful Swallow: Pill Chin tuck: Ineffective; Effective Effective Chin Tuck: Pill Ineffective Chin Tuck: Thin liquid (Level 0) Liquid wash: Ineffective Ineffective Liquid Wash: Pill Supraglottic swallow: Ineffective Ineffective Supraglottic Swallow: Thin liquid (Level 0)   General Information: Caregiver present: Yes Banker)  Diet Prior to this Study: NPO; Cortrak/Small bore NG tube   Temperature : Normal   Respiratory Status: WFL   Supplemental O2: Nasal cannula   History of Recent Intubation: No  Behavior/Cognition: Alert; Cooperative; Requires cueing Self-Feeding Abilities: Able to self-feed Baseline vocal quality/speech: Normal Volitional Cough: Able to elicit Volitional Swallow: Able to elicit Exam Limitations: No limitations Goal Planning: Prognosis for improved oropharyngeal function: Good Barriers to Reach Goals: Cognitive deficits No data recorded Patient/Family Stated Goal: asking for POs Consulted and agree with results and recommendations: Patient; Nurse Pain: Pain Assessment Pain Assessment: Faces Faces Pain Scale: 0 End of Session: Start Time:SLP Start Time (  ACUTE ONLY): 1207 Stop Time: SLP Stop Time (ACUTE ONLY): 1224 Time Calculation:SLP Time Calculation (min) (ACUTE ONLY): 17 min Charges: SLP Evaluations $ SLP Speech Visit: 1 Visit SLP Evaluations $MBS Swallow: 1 Procedure $Swallowing Treatment: 1 Procedure SLP visit diagnosis: SLP Visit Diagnosis: Dysphagia, pharyngeal phase (R13.13) Past Medical History: Past Medical History: Diagnosis Date  Diabetes mellitus   High cholesterol   Hypertension  Past Surgical History: Past Surgical History: Procedure Laterality Date  APPENDECTOMY    ARTERIAL LINE INSERTION N/A 12/09/2023  Procedure: ARTERIAL LINE INSERTION;  Surgeon: Cherrie Toribio SAUNDERS, MD;  Location: [facility] INVASIVE  CV LAB;  Service: Cardiovascular;  Laterality: N/A;  CENTRAL LINE INSERTION  12/09/2023  Procedure: CENTRAL LINE INSERTION;  Surgeon: Cherrie Toribio SAUNDERS, MD;  Location: [facility] CV [facility];  Service: Cardiovascular;;  PACEMAKER IMPLANT N/A 06/12/2023  Procedure: PACEMAKER IMPLANT;  Surgeon: Kennyth Chew, MD;  Location: [facility] INVASIVE CV LAB;  Service: Cardiovascular;  Laterality: N/A;  RIGHT HEART CATH N/A 12/09/2023  Procedure: RIGHT HEART CATH;  Surgeon: Cherrie Toribio SAUNDERS, MD;  Location: [facility] IN[facility]AS[facility] CV LAB;  Service: Cardiovascular;  Laterality: N/A;  RIGHT/LEFT HEART CATH AND CORONARY ANGIOGRAPHY N/A 03/17/2018  Procedure: RIGHT/LEFT HEART CATH AND CORONARY ANGIOGRAPHY;  Surgeon: Claudene Pacific, MD;  Location: [facility] INVASIVE CV LAB;  Service: Cardiovascular;  Laterality: N/A; Leita SAILOR., M.A. CCC-SLP Acute Rehabilitation Services Office: [phone] Secure chat preferred 12/14/2023, 1:24 PM     Scheduled Meds:  acetaZOLAMIDE   250 mg Oral BID   aspirin   81 mg Oral Daily   atorvastatin   40 mg Oral Daily   Chlorhexidine  Gluconate Cloth  6 each Topical Daily   enoxaparin  (LOVENOX ) injection  40 mg Subcutaneous Q24H   ezetimibe   10 mg Oral Daily   feeding supplement (PROSource TF20)  60 mL Per Tube Daily   insulin  aspart  0-20 Units Subcutaneous Q4H   insulin  glargine-yfgn  20 Units Subcutaneous Daily   levothyroxine   50 mcg Oral Q0600   liver oil-zinc  oxide   Topical BID   multivitamin with minerals  1 tablet Oral Daily   nicotine   14 mg Transdermal Daily   mouth rinse  15 mL Mouth Rinse 4 times per day   sodium chloride  flush  3 mL Intravenous Q12H   spironolactone   25 mg Oral Daily   thiamine   100 mg Oral Daily   torsemide   20 mg Oral Daily   Continuous Infusions:  feeding supplement (VITAL 1.5 CAL) 50 mL/hr at 12/16/23 0700     LOS: 9 days    Time spent:    Sigurd Pac, MD [facility]   12/16/2023, 11:12 AM

## 2023-12-16 NOTE — Progress Notes (Addendum)
 Physical Therapy Treatment Patient Details Name: Robert Ramirez MRN: [id number] DOB: 07-03-1945 Today's Date: 12/16/2023   History of Present Illness Pt is a 78 y.o. M presenting to [facility] on 12/07/23 w/ leg weakness and swelling. PMH is significant for HTN, HLD, HFpEF, heart block s/p PPM, and DMT2.    PT Comments  The pt is making gradual functional progress as he was able to ambulate an increased distance of up to ~60 ft before fatiguing today. However, he displays deficits in balance, displaying x1 posterior LOB while standing anterior to the commode, needing modA to recover. He also displays deficits in lower extremity strength and power, needing modA to transfer to stand due to poor ability to initiate extension of his knees. He did progress to minA with subsequent serial reps and repeated cuing for technique though. Educated pt on his risk for falls and his current need for physical assistance with all standing mobility. He verbalized understanding and reported his son can assist whatever he needs. Educated pt on benefits of further rehab before discharging home to maximize his return to independence and safety with mobility, but pt expressing he recognizes he may need it but prefers to d/c home instead. Provided him with a gait belt in case he decides to d/c home rather than to a SNF for further rehab. Will continue to follow acutely.    If plan is discharge home, recommend the following: Assistance with cooking/housework;Assist for transportation;A lot of help with walking and/or transfers;A lot of help with bathing/dressing/bathroom;Help with stairs or ramp for entrance;Direct supervision/assist for medications management;Direct supervision/assist for financial management;Supervision due to cognitive status   Can travel by private vehicle     Yes  Equipment Recommendations  Rolling walker (2 wheels)    Recommendations for Other Services OT consult     Precautions / Restrictions  Precautions Precautions: Fall Recall of Precautions/Restrictions: Intact Precaution/Restrictions Comments: watch SpO2 Restrictions Weight Bearing Restrictions Per Provider Order: No     Mobility  Bed Mobility Overal bed mobility: Needs Assistance Bed Mobility: Supine to Sit     Supine to sit: Contact guard, HOB elevated, Used rails     General bed mobility comments: Extra time, CGA for safety    Transfers Overall transfer level: Needs assistance Equipment used: Rolling walker (2 wheels) Transfers: Sit to/from Stand Sit to Stand: Min assist, Mod assist           General transfer comment: Pt tends to lean posteriorly initially upon standing, needing repeated cues to correct his directional lean. Repeated cues provided to scoot to edge of seat, rock to gain momentum, bring nose over toe, and push up with one hand on his current sitting surface rather than pulling up with bil hands on the RW to stand. x1 rep from EOB with modA, x1 rep from bedside commode with modA, then x5 serial reps from recliner progressing from modA to minA    Ambulation/Gait Ambulation/Gait assistance: Min assist, Contact guard assist, Mod assist Gait Distance (Feet): 60 Feet (x2 bouts of ~60 ft > ~4 ft) Assistive device: Rolling walker (2 wheels) Gait Pattern/deviations: Step-through pattern, Decreased stride length, Trunk flexed, Leaning posteriorly, Decreased step length - right, Decreased step length - left Gait velocity: reduced Gait velocity interpretation: <1.31 ft/sec, indicative of household ambulator   General Gait Details: Pt takes slow, small, mildly unsteady steps. Initially was leaning posteriorly but then his directional lean improved with distance. Cues provided to keep RW on ground. Progressed from needing minA for balance to  CGA. Did have x1 posterior LOB while standing in front of commode, needing modA to recover.   Stairs             Wheelchair Mobility     Tilt Bed     Modified Rankin (Stroke Patients Only)       Balance Overall balance assessment: Needs assistance Sitting-balance support: Feet supported, No upper extremity supported Sitting balance-Leahy Scale: Good Sitting balance - Comments: sits EOB without LOB, supervision for safety   Standing balance support: Bilateral upper extremity supported, During functional activity, Single extremity supported, Reliant on assistive device for balance Standing balance-Leahy Scale: Poor Standing balance comment: reliant on 1 UE support and up to minA to stand and reach off COG to perform pericare; reliant on RW and up to minA to balance while ambulating; x1 posterior LOB needing modA to recover                            Communication Communication Communication: No apparent difficulties  Cognition Arousal: Alert Behavior During Therapy: WFL for tasks assessed/performed   PT - Cognitive impairments: Orientation, Awareness, Safety/Judgement   Orientation impairments: Time                   PT - Cognition Comments: Pt reports It has to be almost August now, but needed max repeated cues to identify that the current month is July. He is aware of the current year, situation, and location. He has decreased insight/awareness of his deficits impacting his safety Following commands: Impaired Following commands impaired: Follows multi-step commands with increased time    Cueing Cueing Techniques: Verbal cues, Tactile cues  Exercises Other Exercises Other Exercises: x5 serial sit <> stand reps from recliner with min-modA    General Comments General comments (skin integrity, edema, etc.): SpO2 down to 87% when pleth was reliable while on RA and performing standing mobility, redonned Hindman on 2L end of session, pt denied lightheadedness/SOB; educated pt on his risk for falls and his current need for physical assistance with all standing mobility, he verbalized understanding and reported his  son can assist whatever he needs. Educated pt on benefits of further rehab before discharging home to maximize his returnt o independence and safety with mobility. Provided him with a gait belt in case he decides to d/c home rather than to a SNF for further rehab.      Pertinent Vitals/Pain Pain Assessment Pain Assessment: No/denies pain    Home Living                          Prior Function            PT Goals (current goals can now be found in the care plan section) Acute Rehab PT Goals Patient Stated Goal: home PT Goal Formulation: With patient Time For Goal Achievement: 12/22/23 Potential to Achieve Goals: Fair Progress towards PT goals: Progressing toward goals    Frequency    Min 2X/week      PT Plan      Co-evaluation              AM-PAC PT 6 Clicks Mobility   Outcome Measure  Help needed turning from your back to your side while in a flat bed without using bedrails?: A Little Help needed moving from lying on your back to sitting on the side of a flat bed without using bedrails?: A Little  Help needed moving to and from a bed to a chair (including a wheelchair)?: A Little Help needed standing up from a chair using your arms (e.g., wheelchair or bedside chair)?: A Lot Help needed to walk in hospital room?: A Little Help needed climbing 3-5 steps with a railing? : Total 6 Click Score: 15    End of Session Equipment Utilized During Treatment: Gait belt;Oxygen Activity Tolerance: Patient tolerated treatment well Patient left: in chair;with call bell/phone within reach;with nursing/sitter in room;Other (comment) (with NT giving bath, reporting she would hook up and start his chair alarm when she was done) Nurse Communication: Mobility status PT Visit Diagnosis: Unsteadiness on feet (R26.81);Other abnormalities of gait and mobility (R26.89);Muscle weakness (generalized) (M62.81);Difficulty in walking, not elsewhere classified (R26.2)     Time:  9147-9077 PT Time Calculation (min) (ACUTE ONLY): 30 min  Charges:    $Gait Training: 8-22 mins $Therapeutic Activity: 8-22 mins PT General Charges $$ ACUTE PT VISIT: 1 Visit                     Theo Ferretti, PT, DPT Acute Rehabilitation Services  Office: [phone]    Theo CHRISTELLA Ferretti 12/16/2023, 10:44 AM

## 2023-12-17 DIAGNOSIS — N289 Disorder of kidney and ureter, unspecified: Secondary | ICD-10-CM

## 2023-12-17 DIAGNOSIS — I5033 Acute on chronic diastolic (congestive) heart failure: Secondary | ICD-10-CM | POA: Diagnosis not present

## 2023-12-17 DIAGNOSIS — E1165 Type 2 diabetes mellitus with hyperglycemia: Secondary | ICD-10-CM | POA: Diagnosis not present

## 2023-12-17 DIAGNOSIS — R6510 Systemic inflammatory response syndrome (SIRS) of non-infectious origin without acute organ dysfunction: Secondary | ICD-10-CM | POA: Diagnosis not present

## 2023-12-17 LAB — CBC
HCT: 49.6 % (ref 39.0–52.0)
Hemoglobin: 15 g/dL (ref 13.0–17.0)
MCH: 28.6 pg (ref 26.0–34.0)
MCHC: 30.2 g/dL (ref 30.0–36.0)
MCV: 94.7 fL (ref 80.0–100.0)
Platelets: 201 K/uL (ref 150–400)
RBC: 5.24 MIL/uL (ref 4.22–5.81)
RDW: 13.3 % (ref 11.5–15.5)
WBC: 9.7 K/uL (ref 4.0–10.5)
nRBC: 0 % (ref 0.0–0.2)

## 2023-12-17 LAB — BASIC METABOLIC PANEL WITH GFR
Anion gap: 11 (ref 5–15)
BUN: 54 mg/dL — ABNORMAL HIGH (ref 8–23)
CO2: 44 mmol/L — ABNORMAL HIGH (ref 22–32)
Calcium: 10.4 mg/dL — ABNORMAL HIGH (ref 8.9–10.3)
Chloride: 87 mmol/L — ABNORMAL LOW (ref 98–111)
Creatinine, Ser: 1.18 mg/dL (ref 0.61–1.24)
GFR, Estimated: >60 mL/min (ref >60)
Glucose, Bld: 146 mg/dL — ABNORMAL HIGH (ref 70–99)
Potassium: 3.8 mmol/L (ref 3.5–5.1)
Sodium: 142 mmol/L (ref 135–145)

## 2023-12-17 LAB — GLUCOSE, CAPILLARY
Glucose-Capillary: 151 mg/dL — ABNORMAL HIGH (ref 70–99)
Glucose-Capillary: 299 mg/dL — ABNORMAL HIGH (ref 70–99)
Glucose-Capillary: 239 mg/dL — ABNORMAL HIGH (ref 70–99)
Glucose-Capillary: 196 mg/dL — ABNORMAL HIGH (ref 70–99)

## 2023-12-17 MED ORDER — INSULIN GLARGINE-YFGN 100 UNIT/ML ~~LOC~~ SOLN
25.0000 [IU] | Freq: Every day | SUBCUTANEOUS | Status: DC
  Administered 2023-12-18 – 2023-12-19 (×2): 25 [IU] via SUBCUTANEOUS
  Filled 2023-12-17 (×2): qty 0.25

## 2023-12-17 NOTE — Progress Notes (Signed)
 Speech Language Pathology Treatment: Dysphagia  Patient Details Name: Robert Ramirez MRN: [id number] DOB: 04/24/1946 Today's Date: 12/17/2023 Time: 8596-8582 SLP Time Calculation (min) (ACUTE ONLY): 14 min  Assessment / Plan / Recommendation Clinical Impression  Robert Ramirez expressed dislike of current chopped diet with nectar-thick liquids. He was alert and affable; mentation appears to be improved.  He enjoyed the thickened apple juice and drank it without overt concern for dysphagia; fed himself peaches with adequate mastication.  Very few verbal cues needed today. Given evidence of silent aspiration on MBS 7/22, recommend repeating study next date to assess physiology before advancing diet/liquids. He verbalized agreement and was willing to wait until tomorrow.    Will plan to repeat MBS next date providing no scheduling issues.    HPI HPI: Pt is a 78 y.o. M presenting to [facility] on 12/07/23 w/ leg weakness and swelling. Admitted for heart failure, pulmonary HTN, and cellulitis. Pt was initially on heart healthy diet but was made NPO 7/16 for RHC and kept NPO due to AMS and respiratory failure. PMH is significant for HTN, HLD, HFpEF, heart block s/p PPM, and DMT2.      SLP Plan  MBS;Continue with current plan of care          Recommendations  Diet recommendations: Dysphagia 2 (fine chop);Nectar-thick liquid Liquids provided via: Cup;Straw Medication Administration: Crushed with puree Supervision: Intermittent supervision to cue for compensatory strategies;Patient able to self feed Compensations: Slow rate;Small sips/bites                  Oral care BID     Dysphagia, oropharyngeal phase (R13.12)     MBS;Continue with current plan of care    Robert Ramirez L. Vona, MA CCC/SLP Clinical Specialist - Acute Care SLP Acute Rehabilitation Services Office number [phone]  Robert Ramirez  12/17/2023, 2:32 PM

## 2023-12-17 NOTE — Plan of Care (Signed)
  Problem: Education: Goal: Ability to describe self-care measures that may prevent or decrease complications (Diabetes Survival Skills Education) will improve Outcome: Progressing Goal: Individualized Educational Video(s) Outcome: Progressing   Problem: Coping: Goal: Ability to adjust to condition or change in health will improve Outcome: Progressing   Problem: Fluid Volume: Goal: Ability to maintain a balanced intake and output will improve Outcome: Progressing   Problem: Health Behavior/Discharge Planning: Goal: Ability to identify and utilize available resources and services will improve Outcome: Progressing Goal: Ability to manage health-related needs will improve Outcome: Progressing   Problem: Metabolic: Goal: Ability to maintain appropriate glucose levels will improve Outcome: Progressing   Problem: Nutritional: Goal: Maintenance of adequate nutrition will improve Outcome: Progressing Goal: Progress toward achieving an optimal weight will improve Outcome: Progressing   Problem: Skin Integrity: Goal: Risk for impaired skin integrity will decrease Outcome: Progressing   Problem: Tissue Perfusion: Goal: Adequacy of tissue perfusion will improve Outcome: Progressing   Problem: Education: Goal: Ability to demonstrate management of disease process will improve Outcome: Progressing Goal: Ability to verbalize understanding of medication therapies will improve Outcome: Progressing Goal: Individualized Educational Video(s) Outcome: Progressing   Problem: Activity: Goal: Capacity to carry out activities will improve Outcome: Progressing   Problem: Cardiac: Goal: Ability to achieve and maintain adequate cardiopulmonary perfusion will improve Outcome: Progressing   Problem: Clinical Measurements: Goal: Ability to avoid or minimize complications of infection will improve Outcome: Progressing   Problem: Skin Integrity: Goal: Skin integrity will improve Outcome:  Progressing   Problem: Education: Goal: Knowledge of General Education information will improve Description: Including pain rating scale, medication(s)/side effects and non-pharmacologic comfort measures Outcome: Progressing   Problem: Health Behavior/Discharge Planning: Goal: Ability to manage health-related needs will improve Outcome: Progressing   Problem: Clinical Measurements: Goal: Ability to maintain clinical measurements within normal limits will improve Outcome: Progressing Goal: Will remain free from infection Outcome: Progressing Goal: Diagnostic test results will improve Outcome: Progressing Goal: Respiratory complications will improve Outcome: Progressing Goal: Cardiovascular complication will be avoided Outcome: Progressing   Problem: Activity: Goal: Risk for activity intolerance will decrease Outcome: Progressing   Problem: Nutrition: Goal: Adequate nutrition will be maintained Outcome: Progressing   Problem: Coping: Goal: Level of anxiety will decrease Outcome: Progressing   Problem: Elimination: Goal: Will not experience complications related to bowel motility Outcome: Progressing Goal: Will not experience complications related to urinary retention Outcome: Progressing   Problem: Pain Managment: Goal: General experience of comfort will improve and/or be controlled Outcome: Progressing   Problem: Safety: Goal: Ability to remain free from injury will improve Outcome: Progressing   Problem: Skin Integrity: Goal: Risk for impaired skin integrity will decrease Outcome: Progressing   Problem: Education: Goal: Understanding of CV disease, CV risk reduction, and recovery process will improve Outcome: Progressing Goal: Individualized Educational Video(s) Outcome: Progressing   Problem: Activity: Goal: Ability to return to baseline activity level will improve Outcome: Progressing   Problem: Cardiovascular: Goal: Ability to achieve and maintain  adequate cardiovascular perfusion will improve Outcome: Progressing Goal: Vascular access site(s) Level 0-1 will be maintained Outcome: Progressing   Problem: Health Behavior/Discharge Planning: Goal: Ability to safely manage health-related needs after discharge will improve Outcome: Progressing

## 2023-12-17 NOTE — Plan of Care (Signed)
  Problem: Tissue Perfusion: Goal: Adequacy of tissue perfusion will improve Outcome: Progressing   Problem: Cardiac: Goal: Ability to achieve and maintain adequate cardiopulmonary perfusion will improve Outcome: Progressing   Problem: Clinical Measurements: Goal: Will remain free from infection Outcome: Progressing   Problem: Elimination: Goal: Will not experience complications related to bowel motility Outcome: Progressing Goal: Will not experience complications related to urinary retention Outcome: Progressing   Problem: Safety: Goal: Ability to remain free from injury will improve Outcome: Progressing

## 2023-12-17 NOTE — Progress Notes (Addendum)
 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, presented to the ED with lower extremity edema and weakness, admitted with CHF exacerbation and cellulitis - Cards following, diuresed, Rx w antibiotics as well - 7/16 complicated by worsening AKI, lethargy, vancomycin  discontinued - RHC 7/16 noted RA pressure of 16, PA mean 34, wedge 11, cardiac index 1.8> ABG with severe respiratory acidosis, hypercarbia> transferred to ICU,> BiPAP - Treated with diuretics, norepinephrine  and milrinone  - Stabilizing, now weaned off pressors - Transferred back from PCCM to TRH service 7/21 - Palliative following, patient and family request full code and full scope - SLP following, diet being advanced - 7/23, core track removed      Assessment/Plan:   Acute on chronic HFpEF, RV failure, pulmonary HTN:  -Echo with EF 60-65%, flattened IV septum, RV enlarged, RVSP 58, moderate TR -CHF team following, volume status improving - RHC 7/16 noted RA pressure of 16, PA mean 48/26, wedge 11, cardiac index 1.8 - Appears euvolemic, diuretics on hold, now off milrinone  and norepinephrine  - Followed by heart failure team, not a candidate for advanced therapies, unlikely to have benefit from oral vasodilators, cards signed off -They will arrange follow-up in pulmonary hypertension clinic, transitioned to oral diuretics -CO2 was trending up, Diamox  added, slowly improving -Continue Diamox , follow-up bicarb level in a.m. - PT OT eval completed, SNF recommended, patient now refusing SNF, will likely need home health services set up   Toxic encephalopathy, hypercarbia Delirium - ABG 7/16 noted severe respiratory acidosis, improved with BiPAP -Unfortunately with poor compliance again,  emphasized -BIPAP at bedtime is critical> improved, finally home BiPAP in the room and patient was compliant last night   Dysphagia -Core track feeding tube removed -SLP following, tolerating dysphagia 2 diet    Sepsis due to cellulitis of RLE: - Improved, now off antibiotics   Type 2 diabetes mellitus - Lantus  20 units subcu daily - CBG is still elevated -Will increase Lantus  to 25 units subcu daily  CHB s/p PPM:  -Stable   Hypothyroidism: TSH wnl.  - Continue synthroid .    AKI on stage IIIa CKD:  - Likely cardiorenal, resolved   CAD, HLD: Troponin elevation but no chest pain.  - Continue statin/zetia    Chronic respiratory failure OSA OHS History of asbestos exposure and known pleural plaques - Continue BiPAP qHS    Medications     acetaZOLAMIDE   250 mg Oral BID   aspirin   81 mg Oral Daily   atorvastatin   40 mg Oral Daily   Chlorhexidine  Gluconate Cloth  6 each Topical Daily   enoxaparin  (LOVENOX ) injection  40 mg Subcutaneous Q24H   ezetimibe   10 mg Oral Daily   feeding supplement (PROSource TF20)  60 mL Per Tube Daily   insulin  aspart  0-20 Units Subcutaneous TID WC & HS   insulin  glargine-yfgn  20 Units Subcutaneous Daily   levothyroxine   50 mcg Oral Q0600   liver oil-zinc  oxide   Topical BID   multivitamin with minerals  1 tablet Oral Daily   nicotine   14 mg Transdermal Daily   mouth rinse  15 mL Mouth Rinse 4 times per day   sodium chloride  flush  3 mL Intravenous Q12H   spironolactone   25 mg Oral Daily   thiamine   100 mg Oral Daily   torsemide   20 mg Oral Daily     Data Reviewed:   CBG:  Recent Labs  Lab 12/16/23 0724 12/16/23 1106 12/16/23 1629 12/16/23 2109 12/17/23 0607  GLUCAP 263* 213* 88 296* 151*    SpO2: 100 % O2 Flow Rate (L/min): 2 L/min FiO2 (%): 36 %    Vitals:   12/16/23 1624 12/16/23 1935 12/16/23 2354 12/17/23 0443  BP: (Abnormal) 120/54 (Abnormal) 128/58 127/61 119/74  Pulse: 63 75 63 70  Resp: 18 19 20 19   Temp:  97.7 F (36.5 C) 98.7 F (37.1 C) 97.8 F (36.6 C) 98 F (36.7 C)  TempSrc: Oral Axillary Axillary Oral  SpO2: 94% 95% 96% 100%  Weight: 74.8 kg   71.5 kg  Height: 6' 1 (1.854 m)         Data Reviewed:  Basic Metabolic Panel: Recent Labs  Lab 12/11/23 0350 12/11/23 1017 12/12/23 0500 12/13/23 0345 12/14/23 0350 12/15/23 0341 12/16/23 0404 12/17/23 0245  NA 135   < > 136 140 143 144 142 142  K 3.1*   < > 3.6 4.1 4.1 3.8 4.0 3.8  CL 87*  --  83* 89* 91* 88* 87* 87*  CO2 35*  --  38* 41* 43* >45* >45* 44*  GLUCOSE 140*  --  221* 174* 175* 190* 218* 146*  BUN 59*  --  59* 62* 53* 49* 54* 54*  CREATININE 1.93*  --  1.57* 1.40* 1.11 1.12 1.09 1.18  CALCIUM  10.2  --  9.7 10.0 10.3 10.2 10.2 10.4*  MG 1.8  --  2.3 2.3  --   --   --   --   PHOS 1.8*  --  3.1 3.1 1.7*  --   --   --    < > = values in this interval not displayed.    CBC: Recent Labs  Lab 12/10/23 0911 12/10/23 1116 12/11/23 0722 12/11/23 1017 12/15/23 0341 12/16/23 0404 12/17/23 0245  WBC 15.8*  --  9.4  --  8.2 9.8 9.7  HGB 14.4   < > 14.0 15.6 14.6 14.3 15.0  HCT 45.4   < > 43.5 46.0 49.0 46.8 49.6  MCV 92.7  --  90.4  --  96.1 95.3 94.7  PLT 221  --  186  --  151 175 201   < > = values in this interval not displayed.    LFT No results for input(s): AST, ALT, ALKPHOS, BILITOT, PROT, ALBUMIN in the last 168 hours.   Antibiotics: Anti-infectives (From admission, onward)    Start     Dose/Rate Route Frequency Ordered Stop   12/11/23 1600  ceFAZolin  (ANCEF ) IVPB 1 g/50 mL premix        1 g 100 mL/hr over 30 Minutes Intravenous Every 8 hours 12/11/23 0815 12/13/23 1658   12/09/23 2200  linezolid  (ZYVOX ) IVPB 600 mg  Status:  Discontinued        600 mg 300 mL/hr over 60 Minutes Intravenous Every 12 hours 12/09/23 0849 12/11/23 0815   12/09/23 1700  cefTRIAXone  (ROCEPHIN ) 2 g in sodium chloride  0.9 % 100 mL IVPB  Status:  Discontinued        2 g 200 mL/hr over 30 Minutes  Intravenous Every 24 hours 12/09/23 0913 12/11/23 0815   12/08/23 2000  vancomycin  (VANCOCIN ) IVPB 1000 mg/200 mL premix  Status:  Discontinued        1,000 mg 200 mL/hr over 60 Minutes Intravenous Every  24 hours 12/07/23 1754 12/09/23 0843   12/07/23 1800  vancomycin  (VANCOREADY) IVPB 1500 mg/300 mL        1,500 mg 150 mL/hr over 120 Minutes Intravenous  Once 12/07/23 1748 12/07/23 2133   12/07/23 1800  ceFEPIme  (MAXIPIME ) 2 g in sodium chloride  0.9 % 100 mL IVPB  Status:  Discontinued        2 g 200 mL/hr over 30 Minutes Intravenous Every 12 hours 12/07/23 1749 12/09/23 0913   12/07/23 1745  ceFAZolin  (ANCEF ) IVPB 2g/100 mL premix  Status:  Discontinued        2 g 200 mL/hr over 30 Minutes Intravenous Every 8 hours 12/07/23 1735 12/07/23 1736   12/07/23 1745  vancomycin  (VANCOCIN ) IVPB 1000 mg/200 mL premix  Status:  Discontinued        1,000 mg 200 mL/hr over 60 Minutes Intravenous  Once 12/07/23 1736 12/07/23 1748   12/07/23 1745  ceFEPIme  (MAXIPIME ) 2 g in sodium chloride  0.9 % 100 mL IVPB  Status:  Discontinued        2 g 200 mL/hr over 30 Minutes Intravenous  Once 12/07/23 1736 12/07/23 1749   12/07/23 1400  ceFAZolin  (ANCEF ) IVPB 2g/100 mL premix  Status:  Discontinued        2 g 200 mL/hr over 30 Minutes Intravenous Every 8 hours 12/07/23 1015 12/07/23 1336   12/07/23 0915  ceFAZolin  (ANCEF ) IVPB 2g/100 mL premix        2 g 200 mL/hr over 30 Minutes Intravenous  Once 12/07/23 0909 12/07/23 0955        DVT prophylaxis: Lovenox   Code Status: Full code  Family Communication: No family at bedside   CONSULTS    Subjective   Denies shortness of breath.  Wants to advance his diet.  Currently on dysphagia 2 diet.    Objective    Physical Examination:   General-appears in no acute distress Heart-S1-S2, regular, no murmur auscultated Lungs-clear to auscultation bilaterally, no wheezing or crackles auscultated Abdomen-soft, nontender, no  organomegaly Extremities-no edema in the lower extremities Neuro-alert, oriented x3, no focal deficit noted   Status is: Inpatient:             Robert Ramirez   [facility] If 7PM-7AM, please contact night-coverage at [email or web address], Office  [phone]   12/17/2023, 7:52 AM  LOS: 10 days

## 2023-12-17 NOTE — TOC Progression Note (Signed)
 Transition of Care [facility]) - Progression Note    Patient Details  Name: Robert Ramirez MRN: [id number] Date of Birth: 1945/08/30  Transition of Care [facility]) CM/SW Contact  Justina Delcia Czar, RN Phone Number: [phone] 12/17/2023, 3:54 PM  Clinical Narrative:     Spoke to pt and states he lives at home with wife. States son will provide transportation home. Gave permission to speak to son.  Had Rollator, scale, bedside commode, and Bipap at home. Will possibly need oxygen for home. Prefers [facility] for DME.  Contacted Shylise, [facility] rep for new referral for [facility]. Left message. Medicare.gov list with rating placed on chart and provided to pt. [facility] accepted referral for [facility]. Will need HH RN, PT orders with F2F.   Will continue to follow for dc needs.   Expected Discharge Plan: Home w Home Health Services Barriers to Discharge: Continued Medical Work up, English as a second language teacher, SNF Pending bed offer               Expected Discharge Plan and Services In-house Referral: Clinical Social Work Discharge Planning Services: CM Consult Post Acute Care Choice: Home Health Living arrangements for the past 2 months: Single Family Home                           HH Arranged: RN, PT HH Agency: [facility] Date HH Agency Contacted: 12/17/23 Time HH Agency Contacted: 1531     Social Drivers of Health (SDOH) Interventions SDOH Screenings   Food Insecurity: No Food Insecurity (12/08/2023)  Housing: Low Risk  (12/08/2023)  Transportation Needs: No Transportation Needs (12/08/2023)  Utilities: Not At Risk (12/08/2023)  Alcohol Screen: Low Risk  (08/06/2023)  Depression (PHQ2-9): Low Risk  (09/30/2023)  Financial Resource Strain: Low Risk  (08/06/2023)  Physical Activity: Sufficiently Active (08/06/2023)  Social Connections: Moderately Isolated (12/08/2023)  Stress: No Stress Concern Present (08/06/2023)  Tobacco Use: High Risk (12/07/2023)  Health Literacy:  Adequate Health Literacy (08/06/2023)    Readmission Risk Interventions     No data to display

## 2023-12-17 NOTE — Progress Notes (Signed)
 Palliative:  HPI: 78 y.o. male with past medical history of HLD, HTN, CAD, HFpEF, complete heart block s/p pacemaker admitted on 12/07/2023 with weakness, SOB, lower extremity edema. He was admitted and treatment started for LE cellulitis, and heart failure. ECHO this admission showed EF 60-65% and enlarged RV. 7/15 he had hypotension and received multiple fluid boluses. Right heart cath performed 7/16. Recovery is complicated due to difficulty tolerating diuresis with hypotension and worsening creatinine. He has also had poor mental status likely related to hypercarbia and/or delirium. May require CRRT. He is on milrinone  and norepinephrine . Prognosis is poor with end stage R heart failure and pulmonary hypertension. Palliative consulted for GOC.    Chart review. I met today with Robert Ramirez. No visitors/family at bedside. Robert Ramirez is sitting up in recliner and engages in conversation with ease. I explained the role of palliative care. He shares that he has been through a difficult ordeal and the past 10 days are something he would not wish on his worst enemy. He shares that he had no idea how ill he was prior to this hospital stay. We spent time reviewing his diagnosis and expectations moving forward. We reviewed that he has had some improvement but it is difficult to know how long we can maintain this improvement. We discussed the risks of this illness recurring and that his outcome may not be as favorable. Robert Ramirez shares that he has already lived longer than his father and all his brothers. He understands that his time may be limited.   We discussed the importance of conversations to clarify his wishes and desires in the event of worsening health and a situation where he may not be able to to speak for himself. He identifies his wife (although concerns she cannot speak on his behalf due to underlying dementia) and step-son Lani as his [employer]chant [employer] officer. He does tell me he has 3 children  of his own but they are not in contact. He is unsure about his wishes for code status - I explained that if we were to provide resuscitative interventions that would be much, much worse than the past 10 days and he would be unlikely to survive. We discussed medical management but also the fine line between interventions to help him have more time and quality of life vs interventions that may only cause him more suffering at the end of his life. He will consider this. He tells me that these are the things he plans to continue to discuss with his family. He would not want to be left on machines if not improving but would want us  to let him go peacefully.   His goals if to continue to maintain as long as possible. He also wishes to remain in his home with his wife and stepson. His wife relies on him due to her dementia and he wants to be there with her. He reports that his stepson is there and able to provide the support they need.   All questions/concerns addressed. Emotional support provided.   Exam:   Plan: - Considering his wishes for code status. Will need ongoing conversations as outpatient. Will refer to outpatient palliative care services.   50 min  Bernarda Kitty, NP Palliative Medicine Team Pager [phone] (Please see [email or web address] for schedule) Team Phone [phone]

## 2023-12-17 NOTE — Evaluation (Signed)
 Occupational Therapy Evaluation Patient Details Name: Robert Ramirez MRN: [id number] DOB: [birth year] Today's Date: 12/17/2023   History of Present Illness   Pt is a 78 y.o. M presenting to [facility] on 12/07/23 w/ leg weakness and swelling. PMH is significant for HTN, HLD, HFpEF, heart block s/p PPM, and DMT2.     Clinical Impressions Pt admitted based on above, and was seen based on problem list below. PTA pt was independent with ADLs and IADLs. Today pt is requiring set up  to min assist  for ADLs. Bed mobility and functional transfers are  min assist with RW. Pt presenting with decreased safety awareness and judgement, creating fall risk. Pt would benefit from <3 hours of skilled rehab daily prior to d/c to increase strength, activity tolerance, and balance. OT will continue to follow acutely to maximize functional independence.     If plan is discharge home, recommend the following:   A little help with walking and/or transfers;A little help with bathing/dressing/bathroom;Assistance with cooking/housework     Functional Status Assessment   Patient has had a recent decline in their functional status and demonstrates the ability to make significant improvements in function in a reasonable and predictable amount of time.     Equipment Recommendations   Other (comment) (Defer to next venue)      Precautions/Restrictions   Precautions Precautions: Fall Recall of Precautions/Restrictions: Intact Precaution/Restrictions Comments: watch SpO2 Restrictions Weight Bearing Restrictions Per Provider Order: No     Mobility Bed Mobility Overal bed mobility: Needs Assistance Bed Mobility: Supine to Sit     Supine to sit: Contact guard, HOB elevated, Used rails     General bed mobility comments: Increased time, reliant on rails    Transfers Overall transfer level: Needs assistance Equipment used: Rolling walker (2 wheels) Transfers: Sit to/from Stand, Bed to  chair/wheelchair/BSC Sit to Stand: Min assist     Step pivot transfers: Min assist     General transfer comment: Min assist to stand, pt with posterior lean, requiring x2 trial to initally come to stand      Balance Overall balance assessment: Needs assistance Sitting-balance support: Feet supported, No upper extremity supported Sitting balance-Leahy Scale: Good     Standing balance support: Bilateral upper extremity supported, During functional activity, Single extremity supported, Reliant on assistive device for balance Standing balance-Leahy Scale: Poor Standing balance comment: reliant on RW       ADL either performed or assessed with clinical judgement   ADL Overall ADL's : Needs assistance/impaired Eating/Feeding: Set up;Sitting   Grooming: Set up;Sitting   Upper Body Bathing: Set up;Sitting   Lower Body Bathing: Minimal assistance;Sit to/from stand   Upper Body Dressing : Set up;Sitting   Lower Body Dressing: Minimal assistance;Sit to/from stand   Toilet Transfer: Minimal assistance;Rolling walker (2 wheels);Ambulation Toilet Transfer Details (indicate cue type and reason): Simulated in room Toileting- Clothing Manipulation and Hygiene: Contact guard assist;Sit to/from stand       Functional mobility during ADLs: Contact guard assist;Rolling walker (2 wheels) General ADL Comments: poor standing balance creates fall risk, difficulty reaching BLEs     Vision Baseline Vision/History: 1 Wears glasses Vision Assessment?: No apparent visual deficits            Pertinent Vitals/Pain Pain Assessment Pain Assessment: No/denies pain     Extremity/Trunk Assessment Upper Extremity Assessment Upper Extremity Assessment: Generalized weakness   Lower Extremity Assessment Lower Extremity Assessment: Defer to PT evaluation   Cervical / Trunk Assessment Cervical / Trunk Assessment:  Kyphotic   Communication Communication Communication: No apparent  difficulties   Cognition Arousal: Alert Behavior During Therapy: WFL for tasks assessed/performed Cognition: Cognition impaired     Awareness: Online awareness impaired     Executive functioning impairment (select all impairments): Problem solving OT - Cognition Comments: Pt with poor insight into deficits. Poor judgement and safety awareness       Following commands: Impaired Following commands impaired: Follows multi-step commands with increased time     Cueing  General Comments   Cueing Techniques: Verbal cues;Tactile cues  O2 levels on RA at rest 96, with mobility O2 stats dropped to 82, replaced on 2L O2           Home Living Family/patient expects to be discharged to:: Private residence Living Arrangements: Spouse/significant other;Children Available Help at Discharge: Family;Available 24 hours/day Type of Home: House Home Access: Level entry     Home Layout: One level     Bathroom Shower/Tub: Chief Strategy Officer: Handicapped height Bathroom Accessibility: Yes   Home Equipment: Cane - single point;BSC/3in1          Prior Functioning/Environment Prior Level of Function : Independent/Modified Independent     Mobility Comments: Use of SPC ADLs Comments: ind    OT Problem List: Decreased strength;Decreased range of motion;Decreased activity tolerance;Impaired balance (sitting and/or standing);Decreased safety awareness;Decreased knowledge of use of DME or AE;Cardiopulmonary status limiting activity   OT Treatment/Interventions: Therapeutic exercise;Self-care/ADL training;Energy conservation;DME and/or AE instruction;Therapeutic activities;Patient/family education;Balance training      OT Goals(Current goals can be found in the care plan section)   Acute Rehab OT Goals Patient Stated Goal: To go home OT Goal Formulation: With patient Time For Goal Achievement: 12/31/23 Potential to Achieve Goals: Good   OT Frequency:  Min  2X/week       AM-PAC OT 6 Clicks Daily Activity     Outcome Measure Help from another person eating meals?: None Help from another person taking care of personal grooming?: A Little Help from another person toileting, which includes using toliet, bedpan, or urinal?: A Little Help from another person bathing (including washing, rinsing, drying)?: A Little Help from another person to put on and taking off regular upper body clothing?: A Little Help from another person to put on and taking off regular lower body clothing?: A Little 6 Click Score: 19   End of Session Equipment Utilized During Treatment: Gait belt;Rolling walker (2 wheels) Nurse Communication: Mobility status  Activity Tolerance: Patient tolerated treatment well Patient left: in chair;with call bell/phone within reach;with chair alarm set  OT Visit Diagnosis: Unsteadiness on feet (R26.81);Other abnormalities of gait and mobility (R26.89);Muscle weakness (generalized) (M62.81)                Time: 8846-8786 OT Time Calculation (min): 20 min Charges:  OT General Charges $OT Visit: 1 Visit OT Evaluation $OT Eval Moderate Complexity: 1 Mod  Adrianne BROCKS, OT  Acute Rehabilitation Services Office [phone] Secure chat preferred   Adrianne GORMAN Savers 12/17/2023, 1:41 PM

## 2023-12-18 ENCOUNTER — Inpatient Hospital Stay (HOSPITAL_COMMUNITY)

## 2023-12-18 DIAGNOSIS — I5033 Acute on chronic diastolic (congestive) heart failure: Secondary | ICD-10-CM | POA: Diagnosis not present

## 2023-12-18 LAB — BASIC METABOLIC PANEL WITH GFR
Anion gap: 9 (ref 5–15)
BUN: 61 mg/dL — ABNORMAL HIGH (ref 8–23)
CO2: 42 mmol/L — ABNORMAL HIGH (ref 22–32)
Calcium: 10.1 mg/dL (ref 8.9–10.3)
Chloride: 90 mmol/L — ABNORMAL LOW (ref 98–111)
Creatinine, Ser: 1.19 mg/dL (ref 0.61–1.24)
GFR, Estimated: >60 mL/min (ref >60)
Glucose, Bld: 115 mg/dL — ABNORMAL HIGH (ref 70–99)
Potassium: 3.9 mmol/L (ref 3.5–5.1)
Sodium: 141 mmol/L (ref 135–145)

## 2023-12-18 LAB — GLUCOSE, CAPILLARY
Glucose-Capillary: 126 mg/dL — ABNORMAL HIGH (ref 70–99)
Glucose-Capillary: 229 mg/dL — ABNORMAL HIGH (ref 70–99)
Glucose-Capillary: 258 mg/dL — ABNORMAL HIGH (ref 70–99)
Glucose-Capillary: 142 mg/dL — ABNORMAL HIGH (ref 70–99)

## 2023-12-18 MED ORDER — ENSURE PLUS HIGH PROTEIN PO LIQD
237.0000 mL | Freq: Two times a day (BID) | ORAL | Status: DC
  Administered 2023-12-18 – 2023-12-19 (×3): 237 mL via ORAL

## 2023-12-18 NOTE — Progress Notes (Signed)
 PT Cancellation Note  Patient Details Name: Robert Ramirez MRN: [id number] DOB: [birth year]   Cancelled Treatment:    Reason Eval/Treat Not Completed: (P) Patient at procedure or test/unavailable (pt off unit for MBS) Will continue efforts per PT plan of care as schedule permits.   Connell HERO Azalia Neuberger 12/18/2023, 10:11 AM

## 2023-12-18 NOTE — Progress Notes (Signed)
 Physical Therapy Treatment Patient Details Name: Robert Ramirez MRN: [id number] DOB: 08/31/1945 Today's Date: 12/18/2023   History of Present Illness Pt is a 78 y.o. M presenting to [facility] on 12/07/23 w/ leg weakness and swelling. PMH is significant for HTN, HLD, HFpEF, heart block s/p PPM, and DMT2, vision deficit.    PT Comments  PTA returned to room to assist pt off BSC as NT was busy in another room and pt is a high fall risk. Pt BP remains soft but he denies symptoms of lightheadedness with transfers, DOE 2/4 with standing/short gait task at bedside. SpO2 desat to 87% on 1L O2 [place] with exertion, needing at least 2L/min, distance limited due to pt fatigue. Pt needing up to minA for transfer safety and CGA to perform short household distance gait trial with RW. Pt may need further ambulatory saturation assessment given limited distance and inconsistent signal this date. Patient will benefit from continued inpatient follow up therapy, <3 hours/day.    If plan is discharge home, recommend the following: Assistance with cooking/housework;Assist for transportation;A lot of help with walking and/or transfers;A lot of help with bathing/dressing/bathroom;Help with stairs or ramp for entrance;Direct supervision/assist for medications management;Direct supervision/assist for financial management;Supervision due to cognitive status   Can travel by private vehicle     Yes  Equipment Recommendations  Rolling walker (2 wheels)    Recommendations for Other Services       Precautions / Restrictions Precautions Precautions: Fall Recall of Precautions/Restrictions: Intact Precaution/Restrictions Comments: watch SpO2/BP Restrictions Weight Bearing Restrictions Per Provider Order: No     Mobility  Bed Mobility Overal bed mobility: Needs Assistance Bed Mobility: Supine to Sit     Supine to sit: Contact guard, HOB elevated, Used rails     General bed mobility comments: pt received up on BSC     Transfers Overall transfer level: Needs assistance Equipment used: Rolling walker (2 wheels) Transfers: Sit to/from Stand Sit to Stand: Contact guard assist, Min assist   Step pivot transfers: Contact guard assist       General transfer comment: CGA from BSC (elevated surface height) pushing from arm rests, and minA for stand>sit to chair surface with cues for safe UE placement.    Ambulation/Gait Ambulation/Gait assistance: Contact guard assist Gait Distance (Feet): 18 Feet Assistive device: Rolling walker (2 wheels) Gait Pattern/deviations: Step-through pattern, Decreased stride length, Trunk flexed, Leaning posteriorly, Decreased step length - right, Decreased step length - left Gait velocity: reduced     General Gait Details: Pt takes slow, small, mildly unsteady steps. Needs cues for forward head posture, pt also perform backward and sidesteps wtihout LOB using RW. Quick to fatigue based on DOE 2/4 and appearance but then pt denies significant fatigue sitting in recliner, but still appears dyspneic.   Stairs             Wheelchair Mobility     Tilt Bed    Modified Rankin (Stroke Patients Only)       Balance Overall balance assessment: Needs assistance Sitting-balance support: Feet supported, No upper extremity supported Sitting balance-Leahy Scale: Good Sitting balance - Comments: sits EOB without LOB, supervision for safety   Standing balance support: Bilateral upper extremity supported, During functional activity, Reliant on assistive device for balance Standing balance-Leahy Scale: Poor Standing balance comment: reliant on RW                            Communication Communication Communication: No  apparent difficulties  Cognition Arousal: Alert Behavior During Therapy: WFL for tasks assessed/performed   PT - Cognitive impairments: Orientation, Awareness, Safety/Judgement                       PT - Cognition Comments: Pt has  decreased insight/awareness of his deficits impacting his safety. Pt reports some visual deficits at baseline. Following commands: Impaired Following commands impaired: Follows multi-step commands with increased time    Cueing Cueing Techniques: Verbal cues, Tactile cues  Exercises General Exercises - Lower Extremity Long Arc Quad: AROM, Both, 10 reps, Seated Hip Flexion/Marching: AROM, Right, Left, 10 reps, Standing (at RW) Toe Raises: AROM, Both, 5 reps, Standing    General Comments General comments (skin integrity, edema, etc.): SpO2 WFL on 3L O2 [place] with exertion. BP (!) 92/58  BP Location Left Arm  BP Method Automatic  Patient Position (if appropriate) Sitting (Recliner at end of session)  Oxygen Therapy  SpO2 96 %  O2 Device Nasal Cannula  O2 Flow Rate (L/min) 2 L/min       Pertinent Vitals/Pain Pain Assessment Pain Assessment: No/denies pain    Home Living                          Prior Function            PT Goals (current goals can now be found in the care plan section) Acute Rehab PT Goals Patient Stated Goal: home PT Goal Formulation: With patient Time For Goal Achievement: 12/22/23 Potential to Achieve Goals: Fair Progress towards PT goals: Progressing toward goals    Frequency    Min 2X/week      PT Plan      Co-evaluation              AM-PAC PT 6 Clicks Mobility   Outcome Measure  Help needed turning from your back to your side while in a flat bed without using bedrails?: A Little Help needed moving from lying on your back to sitting on the side of a flat bed without using bedrails?: A Little Help needed moving to and from a bed to a chair (including a wheelchair)?: A Little Help needed standing up from a chair using your arms (e.g., wheelchair or bedside chair)?: A Little Help needed to walk in hospital room?: A Little Help needed climbing 3-5 steps with a railing? : Total 6 Click Score: 16    End of Session Equipment  Utilized During Treatment: Gait belt;Oxygen Activity Tolerance: Patient tolerated treatment well Patient left: in chair;with call bell/phone within reach;with chair alarm set Nurse Communication: Mobility status;Other (comment) (inconsistent signal on both hands and earlobe trying to obtain amb pulse ox) PT Visit Diagnosis: Unsteadiness on feet (R26.81);Other abnormalities of gait and mobility (R26.89);Muscle weakness (generalized) (M62.81);Difficulty in walking, not elsewhere classified (R26.2) Pain - Right/Left: Right Pain - part of body: Leg     Time: 1555-1611 PT Time Calculation (min) (ACUTE ONLY): 16 min  Charges:    $Gait Training: 8-22 mins PT General Charges $$ ACUTE PT VISIT: 1 Visit                     Javona Bergevin P., PTA Acute Rehabilitation Services Secure Chat Preferred 9a-5:30pm Office: [phone]    Connell HERO [facility] 12/18/2023, 4:49 PM

## 2023-12-18 NOTE — TOC Progression Note (Addendum)
 Transition of Care (TOC) - Progression Note   Spoke to patient at bedside. From home with wife. Has step son who assists.   PT recommending SNF at discharge. Patient declines and wants to go home at discharge. Previous CM has arranged HHRN and HHPT with Shylise with [facility]. Confirmed with Shylise. Will need orders and face to face .   Patient has Rollator, bedside commode and BiPAP at home.   PT recommending rolling walker. Patient wants to use his rollator instead. NCM explained if he changes his mind after he is home HHPT can ask PCP to order rolling walker.  Patient voiced understanding.   If home oxygen needed patient prefers [facility]. Will need ambulatory oxygen saturation note and MD order.   Referral for outpatient palliative care . Discussed with patient , patient in agreement , no preference. Referral given to Shawn with [facility]     Patient Details  Name: Robert Ramirez MRN: [id number] Date of Birth: 09/15/1945  Transition of Care [facility]) CM/SW Contact  Steffi Noviello, Powell Jansky, RN Phone Number: 12/18/2023, 12:35 PM  Clinical Narrative:       Expected Discharge Plan: Home w Home Health Services Barriers to Discharge: Continued Medical Work up, English as a second language teacher, SNF Pending bed offer               Expected Discharge Plan and Services In-house Referral: Clinical Social Work Discharge Planning Services: CM Consult Post Acute Care Choice: Home Health Living arrangements for the past 2 months: Single Family Home                           HH Arranged: RN, PT [facility] Agency: [facility] Date HH Agency Contacted: 12/17/23 Time HH Agency Contacted: 1531     Social Drivers of Health (SDOH) Interventions SDOH Screenings   Food Insecurity: No Food Insecurity (12/08/2023)  Housing: Low Risk  (12/08/2023)  Transportation Needs: No Transportation Needs (12/08/2023)  Utilities: Not At Risk (12/08/2023)  Alcohol Screen: Low Risk  (08/06/2023)   Depression (PHQ2-9): Low Risk  (09/30/2023)  Financial Resource Strain: Low Risk  (08/06/2023)  Physical Activity: Sufficiently Active (08/06/2023)  Social Connections: Moderately Isolated (12/08/2023)  Stress: No Stress Concern Present (08/06/2023)  Tobacco Use: High Risk (12/07/2023)  Health Literacy: Adequate Health Literacy (08/06/2023)    Readmission Risk Interventions     No data to display

## 2023-12-18 NOTE — Progress Notes (Signed)
 Robert Ramirez [phone] [facility] Liaison Note:  Notified by [facility] manager of patient/family request for [facility] Palliative services at home after discharge.   Please call with any hospice or outpatient palliative care related questions.   Thank you for the opportunity to participate in this patient's care.   Elouise Husband, BSN, RN, OCN [employer] [phone]

## 2023-12-18 NOTE — Plan of Care (Signed)
  Problem: Education: Goal: Ability to describe self-care measures that may prevent or decrease complications (Diabetes Survival Skills Education) will improve Outcome: Progressing Goal: Individualized Educational Video(s) Outcome: Progressing   Problem: Coping: Goal: Ability to adjust to condition or change in health will improve Outcome: Progressing   Problem: Fluid Volume: Goal: Ability to maintain a balanced intake and output will improve Outcome: Progressing   Problem: Health Behavior/Discharge Planning: Goal: Ability to identify and utilize available resources and services will improve Outcome: Progressing Goal: Ability to manage health-related needs will improve Outcome: Progressing   Problem: Metabolic: Goal: Ability to maintain appropriate glucose levels will improve Outcome: Progressing   Problem: Nutritional: Goal: Maintenance of adequate nutrition will improve Outcome: Progressing Goal: Progress toward achieving an optimal weight will improve Outcome: Progressing   Problem: Skin Integrity: Goal: Risk for impaired skin integrity will decrease Outcome: Progressing   Problem: Tissue Perfusion: Goal: Adequacy of tissue perfusion will improve Outcome: Progressing   Problem: Education: Goal: Ability to demonstrate management of disease process will improve Outcome: Progressing Goal: Ability to verbalize understanding of medication therapies will improve Outcome: Progressing Goal: Individualized Educational Video(s) Outcome: Progressing   Problem: Activity: Goal: Capacity to carry out activities will improve Outcome: Progressing   Problem: Cardiac: Goal: Ability to achieve and maintain adequate cardiopulmonary perfusion will improve Outcome: Progressing   Problem: Clinical Measurements: Goal: Ability to avoid or minimize complications of infection will improve Outcome: Progressing   Problem: Skin Integrity: Goal: Skin integrity will improve Outcome:  Progressing   Problem: Education: Goal: Knowledge of General Education information will improve Description: Including pain rating scale, medication(s)/side effects and non-pharmacologic comfort measures Outcome: Progressing   Problem: Health Behavior/Discharge Planning: Goal: Ability to manage health-related needs will improve Outcome: Progressing   Problem: Clinical Measurements: Goal: Ability to maintain clinical measurements within normal limits will improve Outcome: Progressing Goal: Will remain free from infection Outcome: Progressing Goal: Diagnostic test results will improve Outcome: Progressing Goal: Respiratory complications will improve Outcome: Progressing Goal: Cardiovascular complication will be avoided Outcome: Progressing   Problem: Activity: Goal: Risk for activity intolerance will decrease Outcome: Progressing   Problem: Nutrition: Goal: Adequate nutrition will be maintained Outcome: Progressing   Problem: Coping: Goal: Level of anxiety will decrease Outcome: Progressing   Problem: Elimination: Goal: Will not experience complications related to bowel motility Outcome: Progressing Goal: Will not experience complications related to urinary retention Outcome: Progressing   Problem: Pain Managment: Goal: General experience of comfort will improve and/or be controlled Outcome: Progressing   Problem: Safety: Goal: Ability to remain free from injury will improve Outcome: Progressing   Problem: Skin Integrity: Goal: Risk for impaired skin integrity will decrease Outcome: Progressing   Problem: Education: Goal: Understanding of CV disease, CV risk reduction, and recovery process will improve Outcome: Progressing Goal: Individualized Educational Video(s) Outcome: Progressing   Problem: Activity: Goal: Ability to return to baseline activity level will improve Outcome: Progressing   Problem: Cardiovascular: Goal: Ability to achieve and maintain  adequate cardiovascular perfusion will improve Outcome: Progressing Goal: Vascular access site(s) Level 0-1 will be maintained Outcome: Progressing   Problem: Health Behavior/Discharge Planning: Goal: Ability to safely manage health-related needs after discharge will improve Outcome: Progressing

## 2023-12-18 NOTE — Progress Notes (Signed)
 Physical Therapy Treatment Patient Details Name: Robert Ramirez MRN: [id number] DOB: Mar 07, 1946 Today's Date: 12/18/2023   History of Present Illness Pt is a 78 y.o. M presenting to [facility] on 12/07/23 w/ leg weakness and swelling. PMH is significant for HTN, HLD, HFpEF, heart block s/p PPM, and DMT2, vision deficit.    PT Comments  Pt received in supine, agreeable to therapy session with encouragement, with emphasis on transfer training and orthostatic BP assessment as well as close vitals assessment for O2. Pt SpO2 with noisy signal, tried both hands and earlobe, SpO2 appears to desat to 90% on RA with sitting EOB, and to 84% on RA with pivot transfer to [facility] from EOB. Pt SpO2 WFL on 2L O2 [place] sitting on BSC when signal achieved. Pt requesting time for BM. Patient will benefit from continued inpatient follow up therapy, <3 hours/day   Vital Signs  Patient Position (if appropriate) Orthostatic Vitals  Orthostatic Lying   BP- Lying 115/50  Pulse- Lying 81  Orthostatic Sitting  BP- Sitting 102/53  Pulse- Sitting 85  Orthostatic Standing at 0 minutes  BP- Standing at 0 minutes 103/46 (no c/o dizziness)  Pulse- Standing at 0 minutes 89     If plan is discharge home, recommend the following: Assistance with cooking/housework;Assist for transportation;A lot of help with walking and/or transfers;A lot of help with bathing/dressing/bathroom;Help with stairs or ramp for entrance;Direct supervision/assist for medications management;Direct supervision/assist for financial management;Supervision due to cognitive status   Can travel by private vehicle     Yes  Equipment Recommendations  Rolling walker (2 wheels)    Recommendations for Other Services       Precautions / Restrictions Precautions Precautions: Fall Recall of Precautions/Restrictions: Intact Precaution/Restrictions Comments: watch SpO2/BP Restrictions Weight Bearing Restrictions Per Provider Order: No     Mobility  Bed  Mobility Overal bed mobility: Needs Assistance Bed Mobility: Supine to Sit     Supine to sit: Contact guard, HOB elevated, Used rails     General bed mobility comments: Increased time, reliant on rails    Transfers Overall transfer level: Needs assistance Equipment used: Rolling walker (2 wheels) Transfers: Sit to/from Stand, Bed to chair/wheelchair/BSC Sit to Stand: Min assist   Step pivot transfers: Contact guard assist       General transfer comment: Min assist to stand, from EOB and CGA to take pivotal steps from bed to [facility] on his R, pt sitting down with minA wtih slightly decreased eccentric control to sit.    Ambulation/Gait                   Stairs             Wheelchair Mobility     Tilt Bed    Modified Rankin (Stroke Patients Only)       Balance Overall balance assessment: Needs assistance Sitting-balance support: Feet supported, No upper extremity supported Sitting balance-Leahy Scale: Good Sitting balance - Comments: sits EOB without LOB, supervision for safety   Standing balance support: Bilateral upper extremity supported, During functional activity, Reliant on assistive device for balance Standing balance-Leahy Scale: Poor Standing balance comment: reliant on RW                            Communication Communication Communication: No apparent difficulties  Cognition Arousal: Alert Behavior During Therapy: WFL for tasks assessed/performed   PT - Cognitive impairments: Orientation, Awareness, Safety/Judgement  PT - Cognition Comments: Pt has decreased insight/awareness of his deficits impacting his safety. Pt reports some visual deficits at baseline. Following commands: Impaired Following commands impaired: Follows multi-step commands with increased time    Cueing Cueing Techniques: Verbal cues, Tactile cues  Exercises      General Comments General comments (skin integrity, edema,  etc.): areas of foam dressings on his arms and sacrum. Pt needing time for BM so RN notified pt up on toilet at end of session. SpO2 noisy signal on fingers sitting EOB so tried ear sensor, but still noisy, so switched back to opposite hand. SpO2 90% on RA sitting EOB wtih intermittent signal, but desat to 84% with intermittent signal transferring to [facility], pt back on 2L/min O2 [place] and reading [facility] when signal achieved. BP checked with postural changes, see note above.      Pertinent Vitals/Pain Pain Assessment Pain Assessment: No/denies pain    Home Living                          Prior Function            PT Goals (current goals can now be found in the care plan section) Acute Rehab PT Goals Patient Stated Goal: home PT Goal Formulation: With patient Time For Goal Achievement: 12/22/23 Potential to Achieve Goals: Fair Progress towards PT goals: Progressing toward goals    Frequency    Min 2X/week      PT Plan      Co-evaluation              AM-PAC PT 6 Clicks Mobility   Outcome Measure  Help needed turning from your back to your side while in a flat bed without using bedrails?: A Little Help needed moving from lying on your back to sitting on the side of a flat bed without using bedrails?: A Little Help needed moving to and from a bed to a chair (including a wheelchair)?: A Little Help needed standing up from a chair using your arms (e.g., wheelchair or bedside chair)?: A Little Help needed to walk in hospital room?: A Little Help needed climbing 3-5 steps with a railing? : Total 6 Click Score: 16    End of Session Equipment Utilized During Treatment: Gait belt;Oxygen Activity Tolerance: Patient tolerated treatment well;Other (comment) (bowel urgency limiting gait tolerance) Patient left: with call bell/phone within reach;Other (comment) (on [facility], RN aware) Nurse Communication: Mobility status;Other (comment) (bad signal on both hands and earlobe trying  to obtain amb pulse ox) PT Visit Diagnosis: Unsteadiness on feet (R26.81);Other abnormalities of gait and mobility (R26.89);Muscle weakness (generalized) (M62.81);Difficulty in walking, not elsewhere classified (R26.2) Pain - Right/Left: Right Pain - part of body: Leg     Time: 8474-8454 PT Time Calculation (min) (ACUTE ONLY): 20 min  Charges:    $Therapeutic Activity: 8-22 mins PT General Charges $$ ACUTE PT VISIT: 1 Visit                     Rashid Whitenight P., PTA Acute Rehabilitation Services Secure Chat Preferred 9a-5:30pm Office: [phone]    Connell HERO [facility] 12/18/2023, 4:42 PM

## 2023-12-18 NOTE — Progress Notes (Signed)
 Nutrition Follow-up  DOCUMENTATION CODES:   Severe malnutrition in context of chronic illness  INTERVENTION:  -Continue Dys 3, thin liuqids per SLP recommendations  -Order assist -Added Ensure Plus High Protein BID -Continue MVI w/min   NUTRITION DIAGNOSIS:   Severe Malnutrition related to chronic illness (CHF) as evidenced by severe fat depletion, severe muscle depletion.  Ongoing  GOAL:   Patient will meet greater than or equal to 90% of their needs  Progressing  MONITOR:   PO intake, Weight trends, Supplement acceptance, Labs, Skin  REASON FOR ASSESSMENT:   Consult Assessment of nutrition requirement/status  ASSESSMENT:   Pt with medical history significant of hypertension, hyperlipidemia, HFpEF, heart block s/p PPM, and diabetes mellitus type 2  presents with leg weakness and swelling.  Spoke to pt at bedside. Pt denies n/v/c/d or chewing difficulties. Last BM 7/23. Cortrak was pulled on 7/23. Pt was very glad SLP upgraded diet texture today to Dys 3. Documented PO intake 75-100% of meals, drinking Ensure Plus High Protein BID. Weight has been trending downward undesirably; BMI 21.39 is below goal range for geriatric parameters. Pt states he is doing well at this time, denies questions/concerns. Will continue to monitor, RDN available prn.   Labs Chloride Co2 42 BG 115 BUN 61 Albumin 2.3  Medications  acetaZOLAMIDE   250 mg Oral BID   aspirin   81 mg Oral Daily   atorvastatin   40 mg Oral Daily   Chlorhexidine  Gluconate Cloth  6 each Topical Daily   enoxaparin  (LOVENOX ) injection  40 mg Subcutaneous Q24H   ezetimibe   10 mg Oral Daily   feeding supplement  237 mL Oral BID BM   insulin  aspart  0-20 Units Subcutaneous TID WC & HS   insulin  glargine-yfgn  25 Units Subcutaneous Daily   levothyroxine   50 mcg Oral Q0600   liver oil-zinc  oxide   Topical BID   multivitamin with minerals  1 tablet Oral Daily   nicotine   14 mg Transdermal Daily   mouth rinse  15 mL  Mouth Rinse 4 times per day   sodium chloride  flush  3 mL Intravenous Q12H   spironolactone   25 mg Oral Daily   torsemide   20 mg Oral Daily     NUTRITION - FOCUSED PHYSICAL EXAM:  Flowsheet Row Most Recent Value  Orbital Region Severe depletion  Upper Arm Region Severe depletion  Thoracic and Lumbar Region Moderate depletion  Buccal Region Severe depletion  Temple Region Severe depletion  Clavicle Bone Region Severe depletion  Clavicle and Acromion Bone Region Severe depletion  Scapular Bone Region Moderate depletion  Dorsal Hand Severe depletion  Patellar Region Severe depletion  Anterior Thigh Region Severe depletion  Posterior Calf Region Severe depletion  Edema (RD Assessment) None  Hair Reviewed  Eyes Reviewed  Mouth Reviewed  Skin Reviewed  Nails Reviewed    Diet Order:   Diet Order             DIET DYS 3 Room service appropriate? Yes with Assist; Fluid consistency: Thin  Diet effective now                   EDUCATION NEEDS:   Education needs have been addressed  Skin:  Skin Assessment: Skin Integrity Issues: Skin Integrity Issues:: Stage I, Unstageable Stage I: Coccyx Unstageable: L 2nd anterior toe  Last BM:  7/23  Height:   Ht Readings from Last 1 Encounters:  12/16/23 6' 1 (1.854 m)    Weight:   Wt Readings from  Last 1 Encounters:  12/18/23 73.5 kg    Ideal Body Weight:  83.6 kg  BMI:  Body mass index is 21.39 kg/m.  Estimated Nutritional Needs:   Kcal:  2200-2400  Protein:  100-115g/d  Fluid:  2.0-2.2 L   Robert Ramirez, RDN, LDN Registered Dietitian Nutritionist RD Inpatient Contact Info in [place]

## 2023-12-18 NOTE — Progress Notes (Signed)
 PROGRESS NOTE  Robert Ramirez  FMW:[id number] DOB: 1945/07/23 DOA: 12/07/2023 PCP: Lorren Greig PARAS, NP  Consultants  Brief Narrative: 78 y.o. male with a PMH significant for type 2 diabetes mellitus, CAD, diastolic CHF, RV failure, OSA/OHS on BiPAP, PPM, history of asbestos exposure with pleural plaque, presented to the ED with lower extremity edema and weakness, admitted with CHF exacerbation and cellulitis - Cards following, diuresed, Rx w antibiotics as well - 7/16 complicated by worsening AKI, lethargy, vancomycin  discontinued - RHC 7/16 noted RA pressure of 16, PA mean 34, wedge 11, cardiac index 1.8> ABG with severe respiratory acidosis, hypercarbia> transferred to ICU,> BiPAP - Treated with diuretics, norepinephrine  and milrinone  - Transferred back from PCCM to TRH service 7/21 - Palliative following, patient and family request full code and full scope - SLP following, diet being advanced - 7/23, core track removed   Assessment & Plan: Acute on chronic HFpEF, RV failure, pulmonary HTN:  -Echo with EF 60-65%, flattened IV septum, RV enlarged, RVSP 58, moderate TR - Appears euvolemic, diuretics on hold, now off milrinone  and norepinephrine  - Followed by heart failure team, not a candidate for advanced therapies, unlikely to have benefit from oral vasodilators, cards signed off -They will arrange follow-up in pulmonary hypertension clinic, transitioned to oral diuretics -CO2 was trending up, Diamox  added, slowly improving -Continue Diamox , bicarb level has stabilized and now downtrending - PT OT eval completed, SNF recommended, patient now refusing SNF, will likely need home health services set up   Toxic encephalopathy, hypercarbia Delirium - ABG 7/16 noted severe respiratory acidosis, improved with BiPAP -Unfortunately with poor compliance again, emphasized -BIPAP at bedtime is critical> improved, finally home BiPAP in the room and patient was compliant last night    Dysphagia -Core track feeding tube removed -SLP following, tolerating dysphagia 2 diet    Sepsis due to cellulitis of RLE: - Improved, now off antibiotics   Type 2 diabetes mellitus - Lantus  25 units subQ daily   CHB s/p PPM:  -Stable   Hypothyroidism: TSH wnl.  - Continue synthroid .    AKI on stage IIIa CKD:  - Likely cardiorenal, resolved   CAD, HLD: Troponin elevation but no chest pain.  - Continue statin/zetia    Chronic respiratory failure OSA OHS History of asbestos exposure and known pleural plaques - Continue BiPAP qHS Nutrition Problem: Severe Malnutrition Etiology: chronic illness (CHF) Signs/Symptoms: severe fat depletion, severe muscle depletion Interventions: Refer to RD note for recommendations   DVT prophylaxis:  Place TED hose Start: 12/11/23 1452 SCDs Start: 12/09/23 1403 enoxaparin  (LOVENOX ) injection 40 mg Start: 12/07/23 1600  Code Status:   Code Status: Full Code Level of care: Progressive Status is: Inpatient Dispo: If continues to improve possibly able to discharge home as early as tomorrow  Subjective: Patient awake alert sitting up in bed.  No complaints today.  Reports breathing is close to baseline  Objective: Vitals:   12/17/23 1938 12/18/23 0020 12/18/23 0527 12/18/23 1111  BP: (!) 108/55 (!) 132/59 (!) 124/54 108/68  Pulse: 77 67  87  Resp: 18 19 19 18   Temp: 98.4 F (36.9 C) (!) 97.4 F (36.3 C) 98.2 F (36.8 C) 98.3 F (36.8 C)  TempSrc: Oral Axillary Oral Oral  SpO2: 98% 100%  100%  Weight:   73.5 kg   Height:        Intake/Output Summary (Last 24 hours) at 12/18/2023 1539 Last data filed at 12/18/2023 1527 Gross per 24 hour  Intake 120 ml  Output 2850  ml  Net -2730 ml   Filed Weights   12/16/23 1624 12/17/23 0443 12/18/23 0527  Weight: 74.8 kg 71.5 kg 73.5 kg   Body mass index is 21.39 kg/m.  Gen: 78 y.o. male in no apparent distress.  Nontoxic, wearing nasal cannula Pulm: Non-labored breathing.  Clear to  auscultation bilaterally.  CV: Regular rate and rhythm. No murmur, rub, or gallop. No JVD GI: Abdomen soft, non-tender, non-distended, with normoactive bowel sounds. No organomegaly or masses felt. Ext: Warm, no deformities, no pedal edema Skin: No rashes, lesions  Neuro: Alert and oriented. No focal neurological deficits. Psych: Calm  Judgement and insight appear normal. Mood & affect appropriate.     I have personally reviewed the following labs and images: CBC: Recent Labs  Lab 12/15/23 0341 12/16/23 0404 12/17/23 0245  WBC 8.2 9.8 9.7  HGB 14.6 14.3 15.0  HCT 49.0 46.8 49.6  MCV 96.1 95.3 94.7  PLT 151 175 201   BMP &GFR Recent Labs  Lab 12/12/23 0500 12/13/23 0345 12/14/23 0350 12/15/23 0341 12/16/23 0404 12/17/23 0245 12/18/23 0230  NA 136 140 143 144 142 142 141  K 3.6 4.1 4.1 3.8 4.0 3.8 3.9  CL 83* 89* 91* 88* 87* 87* 90*  CO2 38* 41* 43* >45* >45* 44* 42*  GLUCOSE 221* 174* 175* 190* 218* 146* 115*  BUN 59* 62* 53* 49* 54* 54* 61*  CREATININE 1.57* 1.40* 1.11 1.12 1.09 1.18 1.19  CALCIUM  9.7 10.0 10.3 10.2 10.2 10.4* 10.1  MG 2.3 2.3  --   --   --   --   --   PHOS 3.1 3.1 1.7*  --   --   --   --    Estimated Creatinine Clearance: 54.9 mL/min (by C-G formula based on SCr of 1.19 mg/dL). Liver & Pancreas: No results for input(s): AST, ALT, ALKPHOS, BILITOT, PROT, ALBUMIN in the last 168 hours. No results for input(s): LIPASE, AMYLASE in the last 168 hours. No results for input(s): AMMONIA in the last 168 hours. Diabetic: No results for input(s): HGBA1C in the last 72 hours. Recent Labs  Lab 12/17/23 1049 12/17/23 1555 12/17/23 2055 12/18/23 0632 12/18/23 1108  GLUCAP 299* 239* 196* 126* 229*   Cardiac Enzymes: No results for input(s): CKTOTAL, CKMB, CKMBINDEX, TROPONINI in the last 168 hours. No results for input(s): PROBNP in the last 8760 hours. Coagulation Profile: No results for input(s): INR, PROTIME in the  last 168 hours. Thyroid  Function Tests: No results for input(s): TSH, T4TOTAL, FREET4, T3FREE, THYROIDAB in the last 72 hours. Lipid Profile: No results for input(s): CHOL, HDL, LDLCALC, TRIG, CHOLHDL, LDLDIRECT in the last 72 hours. Anemia Panel: No results for input(s): VITAMINB12, FOLATE, FERRITIN, TIBC, IRON, RETICCTPCT in the last 72 hours. Urine analysis:    Component Value Date/Time   COLORURINE YELLOW 12/07/2023 0839   APPEARANCEUR CLEAR 12/07/2023 0839   LABSPEC 1.022 12/07/2023 0839   PHURINE 5.0 12/07/2023 0839   GLUCOSEU >=500 (A) 12/07/2023 0839   HGBUR NEGATIVE 12/07/2023 0839   BILIRUBINUR NEGATIVE 12/07/2023 0839   KETONESUR NEGATIVE 12/07/2023 0839   PROTEINUR NEGATIVE 12/07/2023 0839   NITRITE NEGATIVE 12/07/2023 0839   LEUKOCYTESUR NEGATIVE 12/07/2023 0839   Sepsis Labs: Invalid input(s): PROCALCITONIN, LACTICIDVEN  Microbiology: Recent Results (from the past 240 hours)  MRSA Next Gen by PCR, Nasal     Status: None   Collection Time: 12/09/23  2:03 PM   Specimen: Nasal Mucosa; Nasal Swab  Result Value Ref Range Status  MRSA by PCR Next Gen NOT DETECTED NOT DETECTED Final    Comment: (NOTE) The GeneXpert MRSA Assay (FDA approved for NASAL specimens only), is one component of a comprehensive MRSA colonization surveillance program. It is not intended to diagnose MRSA infection nor to guide or monitor treatment for MRSA infections. Test performance is not FDA approved in patients less than 64 years old. Performed at [facility], [address], [place], [place] [address]     Radiology Studies: DG Swallowing Func-Speech Pathology Result Date: 12/18/2023 Table formatting from the original result was not included. Modified Barium Swallow Study Patient Details Name: Robert Ramirez MRN: [id number] Date of Birth: 1946/05/08 Today's Date: 12/18/2023 HPI/PMH: HPI: Pt is a 78 y.o. M presenting to [facility] on 12/07/23 w/ leg  weakness and swelling. Admitted for heart failure, pulmonary HTN, and cellulitis. Pt was initially on heart healthy diet but was made NPO 7/16 for RHC and kept NPO due to AMS and respiratory failure. PMH is significant for HTN, HLD, HFpEF, heart block s/p PPM, and DMT2. Clinical Impression: Clinical Impression: Pt shows mild improvements and is suspected to be closer to his baseline level of function. He still has impaired timing as well as reduced hyolaryngeal movement and larygneal vestibule closure that result in penetration of both thin (PAS 5) and nectar thick (PAS 3) liquids. No aspiration was observed throughout the study though. Although penetration could not be prevented with swallowing strategies, the majority could be ejected with a cued throat clear (more effective than a cued cough). Pt comments that he clears his throat frequently when drinking liquids at baseline. Given his overall improvements this admission and that he has frank penetration of both thin and nectar thick liquids, recommend that he advance to Dys 3 diet with thin liquids. He would still benefit from ongoing therapy to encourage use of strategies (small sips, throat clear after sips) as well as exercises to target pharyngeal physiology and more effective airway protection. Factors that may increase risk of adverse event in presence of aspiration Noe & Lianne 2021): Factors that may increase risk of adverse event in presence of aspiration Noe & Lianne 2021): Poor general health and/or compromised immunity; Respiratory or GI disease; Frail or deconditioned; Weak cough Recommendations/Plan: Swallowing Evaluation Recommendations Swallowing Evaluation Recommendations Recommendations: PO diet PO Diet Recommendation: Dysphagia 3 (Mechanical soft); Thin liquids (Level 0) Liquid Administration via: Cup Medication Administration: Crushed with puree Supervision: Patient able to self-feed; Full supervision/cueing for swallowing  strategies Swallowing strategies  : Minimize environmental distractions; Slow rate; Small bites/sips; Avoid mixed consistencies; Clear throat intermittently Postural changes: Position pt fully upright for meals; Stay upright 30-60 min after meals Oral care recommendations: Oral care BID (2x/day) Treatment Plan Treatment Plan Treatment recommendations: Therapy as outlined in treatment plan below Follow-up recommendations: Skilled nursing-short term rehab (<3 hours/day) Functional status assessment: Patient has had a recent decline in their functional status and demonstrates the ability to make significant improvements in function in a reasonable and predictable amount of time. Treatment frequency: Min 2x/week Treatment duration: 2 weeks Interventions: Aspiration precaution training; Patient/family education; Compensatory techniques; Oropharyngeal exercises; Respiratory muscle strength training; Diet toleration management by SLP Recommendations Recommendations for follow up therapy are one component of a multi-disciplinary discharge planning process, led by the attending physician.  Recommendations may be updated based on patient status, additional functional criteria and insurance authorization. Assessment: Orofacial Exam: Orofacial Exam Oral Cavity - Dentition: Edentulous Anatomy: Anatomy: Suspected cervical osteophytes Boluses Administered: Boluses Administered Boluses Administered: Thin  liquids (Level 0); Mildly thick liquids (Level 2, nectar thick); Puree; Solid  Oral Impairment Domain: Oral Impairment Domain Lip Closure: No labial escape Tongue control during bolus hold: Cohesive bolus between tongue to palatal seal Bolus preparation/mastication: Timely and efficient chewing and mashing Bolus transport/lingual motion: Brisk tongue motion Oral residue: Trace residue lining oral structures Location of oral residue : Tongue Initiation of pharyngeal swallow : Pyriform sinuses  Pharyngeal Impairment Domain:  Pharyngeal Impairment Domain Soft palate elevation: No bolus between soft palate (SP)/pharyngeal wall (PW) Laryngeal elevation: Partial superior movement of thyroid  cartilage/partial approximation of arytenoids to epiglottic petiole Anterior hyoid excursion: Partial anterior movement Epiglottic movement: Complete inversion Laryngeal vestibule closure: Incomplete, narrow column air/contrast in laryngeal vestibule Pharyngeal stripping wave : Present - complete Pharyngeal contraction (A/P view only): N/A Pharyngoesophageal segment opening: Partial distention/partial duration, partial obstruction of flow Tongue base retraction: No contrast between tongue base and posterior pharyngeal wall (PPW) Pharyngeal residue: Collection of residue within or on pharyngeal structures Location of pharyngeal residue: Valleculae  Esophageal Impairment Domain: No data recorded Pill: No data recorded Penetration/Aspiration Scale Score: Penetration/Aspiration Scale Score 1.  Material does not enter airway: Puree 3.  Material enters airway, remains ABOVE vocal cords and not ejected out: Mildly thick liquids (Level 2, nectar thick) 5.  Material enters airway, CONTACTS cords and not ejected out: Thin liquids (Level 0) Compensatory Strategies: Compensatory Strategies Compensatory strategies: Yes Straw: Ineffective Ineffective Straw: Thin liquid (Level 0) Chin tuck: Ineffective Ineffective Chin Tuck: Thin liquid (Level 0)   General Information: Caregiver present: No  Diet Prior to this Study: Dysphagia 2 (finely chopped); Mildly thick liquids (Level 2, nectar thick)   Temperature : Normal   Respiratory Status: WFL   Supplemental O2: Nasal cannula   History of Recent Intubation: No  Behavior/Cognition: Alert; Cooperative; Requires cueing Self-Feeding Abilities: Able to self-feed Baseline vocal quality/speech: Normal Volitional Cough: Able to elicit Volitional Swallow: Able to elicit Exam Limitations: No limitations Goal Planning: Prognosis for  improved oropharyngeal function: Good Barriers to Reach Goals: Time post onset No data recorded Patient/Family Stated Goal: asking for POs Consulted and agree with results and recommendations: Patient Pain: Pain Assessment Pain Assessment: Faces Faces Pain Scale: 0 End of Session: Start Time:SLP Start Time (ACUTE ONLY): 0925 Stop Time: SLP Stop Time (ACUTE ONLY): 0942 Time Calculation:SLP Time Calculation (min) (ACUTE ONLY): 17 min Charges: SLP Evaluations $ SLP Speech Visit: 1 Visit SLP Evaluations $MBS Swallow: 1 Procedure $Swallowing Treatment: 1 Procedure SLP visit diagnosis: SLP Visit Diagnosis: Dysphagia, oropharyngeal phase (R13.12) Past Medical History: Past Medical History: Diagnosis Date  Diabetes mellitus   High cholesterol   Hypertension  Past Surgical History: Past Surgical History: Procedure Laterality Date  APPENDECTOMY    ARTERIAL LINE INSERTION N/A 12/09/2023  Procedure: ARTERIAL LINE INSERTION;  Surgeon: Cherrie Toribio SAUNDERS, MD;  Location: [facility] INVASIVE CV LAB;  Service: Cardiovascular;  Laterality: N/A;  CENTRAL LINE INSERTION  12/09/2023  Procedure: CENTRAL LINE INSERTION;  Surgeon: Cherrie Toribio SAUNDERS, MD;  Location: [facility] INVAS[facility] CV [facility];  Service: Cardiovascular;;  PACEMAKER IMPLANT N/A 06/12/2023  Procedure: PACEMAKER IMPLANT;  Surgeon: Kennyth Chew, MD;  Location: [facility] INVASIVE CV LAB;  Service: Cardiovascular;  Laterality: N/A;  RIGHT HEART CATH N/A 12/09/2023  Procedure: RIGHT HEART CATH;  Surgeon: Cherrie Toribio SAUNDERS, MD;  Location: [facility] INVASIVE CV LAB;  Service: Cardiovascular;  Laterality: N/A;  RIGHT/LEFT HEART CATH AND CORONARY ANGIOGRAPHY N/A 03/17/2018  Procedure: RIGHT/LEFT HEART CATH AND CORONARY ANGIOGRAPHY;  Surgeon: Claudene Pacific, MD;  Location:  [facility] INVASIVE CV LAB;  Service: Cardiovascular;  Laterality: N/A; Leita SAILOR., M.A. CCC-SLP Acute Rehabilitation Services Office: [phone] Secure chat preferred 12/18/2023, 10:31 AM   Scheduled Meds:  acetaZOLAMIDE   250 mg Oral BID   aspirin    81 mg Oral Daily   atorvastatin   40 mg Oral Daily   Chlorhexidine  Gluconate Cloth  6 each Topical Daily   enoxaparin  (LOVENOX ) injection  40 mg Subcutaneous Q24H   ezetimibe   10 mg Oral Daily   feeding supplement  237 mL Oral BID BM   insulin  aspart  0-20 Units Subcutaneous TID WC & HS   insulin  glargine-yfgn  25 Units Subcutaneous Daily   levothyroxine   50 mcg Oral Q0600   liver oil-zinc  oxide   Topical BID   multivitamin with minerals  1 tablet Oral Daily   nicotine   14 mg Transdermal Daily   mouth rinse  15 mL Mouth Rinse 4 times per day   sodium chloride  flush  3 mL Intravenous Q12H   spironolactone   25 mg Oral Daily   torsemide   20 mg Oral Daily   Continuous Infusions:   LOS: 11 days   35 minutes with more than 50% spent in reviewing records, counseling patient/family and coordinating care.  Reyes VEAR Gaw, MD [facility] [email or web address] 12/18/2023, 3:39 PM

## 2023-12-18 NOTE — Evaluation (Signed)
 Modified Barium Swallow Study  Patient Details  Name: Robert Ramirez MRN: [id number] Date of Birth: 1945-06-07  Today's Date: 12/18/2023  Modified Barium Swallow completed.  Full report located under Chart Review in the Imaging Section.  History of Present Illness Pt is a 78 y.o. M presenting to [facility] on 12/07/23 w/ leg weakness and swelling. Admitted for heart failure, pulmonary HTN, and cellulitis. Pt was initially on heart healthy diet but was made NPO 7/16 for RHC and kept NPO due to AMS and respiratory failure. PMH is significant for HTN, HLD, HFpEF, heart block s/p PPM, and DMT2.   Clinical Impression Pt shows mild improvements and is suspected to be closer to his baseline level of function. He still has impaired timing as well as reduced hyolaryngeal movement and larygneal vestibule closure that result in penetration of both thin (PAS 5) and nectar thick (PAS 3) liquids. No aspiration was observed throughout the study though. Although penetration could not be prevented with swallowing strategies, the majority could be ejected with a cued throat clear (more effective than a cued cough). Pt comments that he clears his throat frequently when drinking liquids at baseline. Given his overall improvements this admission and that he has frank penetration of both thin and nectar thick liquids, recommend that he advance to Dys 3 diet with thin liquids. He would still benefit from ongoing therapy to encourage use of strategies (small sips, throat clear after sips) as well as exercises to target pharyngeal physiology and more effective airway protection.  Factors that may increase risk of adverse event in presence of aspiration Noe & Lianne 2021): Poor general health and/or compromised immunity;Respiratory or GI disease;Frail or deconditioned;Weak cough  Swallow Evaluation Recommendations Recommendations: PO diet PO Diet Recommendation: Dysphagia 3 (Mechanical soft);Thin liquids (Level 0) Liquid  Administration via: Cup Medication Administration: Crushed with puree Supervision: Patient able to self-feed;Full supervision/cueing for swallowing strategies Swallowing strategies  : Minimize environmental distractions;Slow rate;Small bites/sips;Avoid mixed consistencies;Clear throat intermittently Postural changes: Position pt fully upright for meals;Stay upright 30-60 min after meals Oral care recommendations: Oral care BID (2x/day)      Leita SAILOR., M.A. CCC-SLP Acute Rehabilitation Services Office: [phone]  Secure chat preferred  12/18/2023,10:30 AM

## 2023-12-19 ENCOUNTER — Other Ambulatory Visit (HOSPITAL_COMMUNITY): Payer: Self-pay

## 2023-12-19 DIAGNOSIS — I5033 Acute on chronic diastolic (congestive) heart failure: Secondary | ICD-10-CM | POA: Diagnosis not present

## 2023-12-19 DIAGNOSIS — G4733 Obstructive sleep apnea (adult) (pediatric): Secondary | ICD-10-CM | POA: Diagnosis not present

## 2023-12-19 LAB — BASIC METABOLIC PANEL WITH GFR
Anion gap: 8 (ref 5–15)
BUN: 66 mg/dL — ABNORMAL HIGH (ref 8–23)
CO2: 38 mmol/L — ABNORMAL HIGH (ref 22–32)
Calcium: 9.7 mg/dL (ref 8.9–10.3)
Chloride: 90 mmol/L — ABNORMAL LOW (ref 98–111)
Creatinine, Ser: 1.22 mg/dL (ref 0.61–1.24)
GFR, Estimated: >60 mL/min (ref >60)
Glucose, Bld: 104 mg/dL — ABNORMAL HIGH (ref 70–99)
Potassium: 3.4 mmol/L — ABNORMAL LOW (ref 3.5–5.1)
Sodium: 136 mmol/L (ref 135–145)

## 2023-12-19 LAB — CBC
HCT: 47.5 % (ref 39.0–52.0)
Hemoglobin: 14.6 g/dL (ref 13.0–17.0)
MCH: 28.9 pg (ref 26.0–34.0)
MCHC: 30.7 g/dL (ref 30.0–36.0)
MCV: 93.9 fL (ref 80.0–100.0)
Platelets: 193 K/uL (ref 150–400)
RBC: 5.06 MIL/uL (ref 4.22–5.81)
RDW: 13.1 % (ref 11.5–15.5)
WBC: 8.4 K/uL (ref 4.0–10.5)
nRBC: 0 % (ref 0.0–0.2)

## 2023-12-19 LAB — GLUCOSE, CAPILLARY
Glucose-Capillary: 96 mg/dL (ref 70–99)
Glucose-Capillary: 230 mg/dL — ABNORMAL HIGH (ref 70–99)

## 2023-12-19 MED ORDER — SPIRONOLACTONE 25 MG PO TABS
12.5000 mg | ORAL_TABLET | Freq: Every day | ORAL | 0 refills | Status: DC
  Filled 2023-12-19: qty 15, 30d supply, fill #0

## 2023-12-19 MED ORDER — ACETAZOLAMIDE 250 MG PO TABS
250.0000 mg | ORAL_TABLET | Freq: Every day | ORAL | 0 refills | Status: DC
  Filled 2023-12-19: qty 30, 30d supply, fill #0

## 2023-12-19 MED ORDER — POTASSIUM CHLORIDE CRYS ER 20 MEQ PO TBCR
40.0000 meq | EXTENDED_RELEASE_TABLET | Freq: Once | ORAL | Status: AC
  Administered 2023-12-19: 40 meq via ORAL
  Filled 2023-12-19: qty 2

## 2023-12-19 MED ORDER — TORSEMIDE 20 MG PO TABS
20.0000 mg | ORAL_TABLET | Freq: Every day | ORAL | 0 refills | Status: DC
  Filled 2023-12-19: qty 30, 30d supply, fill #0

## 2023-12-19 NOTE — Progress Notes (Signed)
 Physical Therapy Treatment Patient Details Name: Robert Ramirez MRN: [id number] DOB: 1946/02/03 Today's Date: 12/19/2023   History of Present Illness Pt is a 78 y.o. M presenting to [facility] on 12/07/23 w/ leg weakness and swelling. PMH is significant for HTN, HLD, HFpEF, heart block s/p PPM, and DMT2, vision deficit.    PT Comments  Pt received sitting in the recliner after ambulating with nursing and agreeable to session. Pt able to tolerate increased gait distance with CGA for safety. Unable to obtain stable O2 pleth during ambulation, however pt noted to desat during STS in room to 85% on 2L and recovers quickly to >90% with seated rest and pursed lip breathing. Pt demonstrates improved activity tolerance this session with multiple STS trials and BLE exercises post gait trial. Pt continues to benefit from PT services to progress toward functional mobility goals.     If plan is discharge home, recommend the following: Assistance with cooking/housework;Assist for transportation;A lot of help with walking and/or transfers;A lot of help with bathing/dressing/bathroom;Help with stairs or ramp for entrance;Direct supervision/assist for medications management;Direct supervision/assist for financial management;Supervision due to cognitive status   Can travel by private vehicle     Yes  Equipment Recommendations  Rolling walker (2 wheels)    Recommendations for Other Services       Precautions / Restrictions Precautions Precautions: Fall Recall of Precautions/Restrictions: Intact Precaution/Restrictions Comments: watch SpO2/BP Restrictions Weight Bearing Restrictions Per Provider Order: No     Mobility  Bed Mobility               General bed mobility comments: Pt in recliner at beginning and end of session    Transfers Overall transfer level: Needs assistance Equipment used: Rolling walker (2 wheels) Transfers: Sit to/from Stand Sit to Stand: Contact guard assist, Min assist            General transfer comment: From recliner x3 initially with min A progressing to CGA with increased time for power up. cues for hand placement    Ambulation/Gait Ambulation/Gait assistance: Contact guard assist Gait Distance (Feet): 100 Feet Assistive device: Rolling walker (2 wheels) Gait Pattern/deviations: Step-through pattern, Decreased stride length, Trunk flexed, Decreased step length - right, Decreased step length - left Gait velocity: reduced     General Gait Details: Pt demonstrates slow, short steps with cues for increased step length and upright posture.   Stairs             Wheelchair Mobility     Tilt Bed    Modified Rankin (Stroke Patients Only)       Balance Overall balance assessment: Needs assistance Sitting-balance support: Feet supported, No upper extremity supported Sitting balance-Leahy Scale: Good     Standing balance support: Bilateral upper extremity supported, During functional activity, Reliant on assistive device for balance Standing balance-Leahy Scale: Poor Standing balance comment: reliant on RW                            Communication Communication Communication: No apparent difficulties  Cognition Arousal: Alert Behavior During Therapy: WFL for tasks assessed/performed   PT - Cognitive impairments: Orientation, Awareness, Safety/Judgement                         Following commands: Impaired Following commands impaired: Follows multi-step commands with increased time    Cueing Cueing Techniques: Verbal cues, Tactile cues  Exercises General Exercises - Lower Extremity Long Arc  Quad: AROM, Both, 10 reps, Seated Hip Flexion/Marching: AROM, Seated, Both, 10 reps    General Comments General comments (skin integrity, edema, etc.): SpO2 as low as 85% on 2L with exertion, but recovers with seated rest to >90%      Pertinent Vitals/Pain Pain Assessment Pain Assessment: No/denies pain     PT  Goals (current goals can now be found in the care plan section) Acute Rehab PT Goals Patient Stated Goal: home PT Goal Formulation: With patient Time For Goal Achievement: 12/22/23 Progress towards PT goals: Progressing toward goals    Frequency    Min 2X/week          AM-PAC PT 6 Clicks Mobility   Outcome Measure  Help needed turning from your back to your side while in a flat bed without using bedrails?: A Little Help needed moving from lying on your back to sitting on the side of a flat bed without using bedrails?: A Little Help needed moving to and from a bed to a chair (including a wheelchair)?: A Little Help needed standing up from a chair using your arms (e.g., wheelchair or bedside chair)?: A Little Help needed to walk in hospital room?: A Little Help needed climbing 3-5 steps with a railing? : A Lot 6 Click Score: 17    End of Session Equipment Utilized During Treatment: Gait belt;Oxygen Activity Tolerance: Patient tolerated treatment well Patient left: in chair;with call bell/phone within reach;with chair alarm set Nurse Communication: Mobility status;Other (comment) (SpO2) PT Visit Diagnosis: Unsteadiness on feet (R26.81);Other abnormalities of gait and mobility (R26.89);Muscle weakness (generalized) (M62.81);Difficulty in walking, not elsewhere classified (R26.2) Pain - Right/Left: Right Pain - part of body: Leg     Time: 8880-8855 PT Time Calculation (min) (ACUTE ONLY): 25 min  Charges:    $Gait Training: 8-22 mins $Therapeutic Exercise: 8-22 mins PT General Charges $$ ACUTE PT VISIT: 1 Visit                     [name], PTA Acute Rehabilitation Services Secure Chat Preferred  [phone]    [name] 12/19/2023, 1:07 PM

## 2023-12-19 NOTE — Progress Notes (Signed)
 SATURATION QUALIFICATIONS: (This note is used to comply with regulatory documentation for home oxygen)  Patient Saturations on Room Air at Rest = 97%  Patient Saturations on Room Air while Ambulating = 86%  Patient Saturations on 2 Liters of oxygen while Ambulating = 94%  Please briefly explain why patient needs home oxygen: Pt desatted to 86% on room air while ambulating. Pt required 2LNC to obtain O2 sat of 94% while ambulating.

## 2023-12-19 NOTE — Progress Notes (Signed)
 Speech Language Pathology Treatment: Dysphagia  Patient Details Name: Robert Ramirez MRN: [id number] DOB: 1946/02/14 Today's Date: 12/19/2023 Time: 8573-8557 SLP Time Calculation (min) (ACUTE ONLY): 16 min  Assessment / Plan / Recommendation Clinical Impression  Pt seen for follow-up tx post MBS completed yesterday (7/25). Pt upright in chair and very pleasant upon SLP arrival. Clinician reviewed results of MBS with pt and diet recommendations. He recalled all swallow strategies (small sips, throat clear after sips) independently and without prior review from this SLP. Straws seen on table and pt reports that while eating in the bed he had a difficult time sipping liquids from the edge of the cup, so he requested a straw. Educated him regarding recommendation for cup use only to decrease airway compromise with liquids and encouraged him to sit in the chair for meals when able. He expressed preference for chair in regards to better upright positioning and overall comfort during meal consumption. Pt consumed sips of thin liquids by straw and cup sips today. He demonstrated use of small sips and throat clear with sips with 80% accuracy, requiring only an occasional verbal cue from clinician. Dys 3 fruit cup (peaches) was self-fed without overt difficulties. Recommend continue current diet with use of above swallow strategies in order to decrease risk for aspiration and associated medical complications. Will continue f/u.     HPI HPI: Pt is a 78 y.o. M presenting to [facility] on 12/07/23 w/ leg weakness and swelling. Admitted for heart failure, pulmonary HTN, and cellulitis. Pt was initially on heart healthy diet but was made NPO 7/16 for RHC and kept NPO due to AMS and respiratory failure. PMH is significant for HTN, HLD, HFpEF, heart block s/p PPM, and DMT2.      SLP Plan  Continue with current plan of care          Recommendations  Diet recommendations: Dysphagia 3 (mechanical soft);Thin  liquid Liquids provided via: Cup;No straw Medication Administration: Crushed with puree Supervision: Intermittent supervision to cue for compensatory strategies;Patient able to self feed Compensations: Slow rate;Small sips/bites;Clear throat after each swallow Postural Changes and/or Swallow Maneuvers: Seated upright 90 degrees                  Oral care BID   Intermittent Supervision/Assistance Dysphagia, oropharyngeal phase (R13.12)     Continue with current plan of care      Robert Kin, MA, CCC-SLP Acute Rehabilitation Services Office Number: [phone]  Robert Robert Ramirez  12/19/2023, 2:52 PM

## 2023-12-19 NOTE — TOC Transition Note (Signed)
 Transition of Care [facility]) - Discharge Note   Patient Details  Name: Robert Ramirez MRN: [id number] Date of Birth: Sep 07, 1945  [facility] CM/SW Contact:  Marval Gell, RN Phone Number: 12/19/2023, 3:36 PM   Clinical Narrative:     Notified [facility] that patient will DC today. Spoke w Sonny at [employer], home oxygen will be delivered to the room for DC.  No other needs for DC identified     Barriers to Discharge: No Barriers Identified   Patient Goals and CMS Choice Patient states their goals for this hospitalization and ongoing recovery are:: to return home CMS Medicare.gov Compare Post Acute Care list provided to:: Patient Choice offered to / list presented to : Patient      Discharge Placement                       Discharge Plan and Services Additional resources added to the After Visit Summary for   In-house Referral: Clinical Social Work Discharge Planning Services: CM Consult Post Acute Care Choice: Home Health                    HH Arranged: RN, PT [facility] Agency: [facility] Date HH Agency Contacted: 12/17/23 Time HH Agency Contacted: [phone]    Social Drivers of Health (SDOH) Interventions SDOH Screenings   Food Insecurity: No Food Insecurity (12/08/2023)  Housing: Low Risk  (12/08/2023)  Transportation Needs: No Transportation Needs (12/08/2023)  Utilities: Not At Risk (12/08/2023)  Alcohol Screen: Low Risk  (08/06/2023)  Depression (PHQ2-9): Low Risk  (09/30/2023)  Financial Resource Strain: Low Risk  (08/06/2023)  Physical Activity: Sufficiently Active (08/06/2023)  Social Connections: Moderately Isolated (12/08/2023)  Stress: No Stress Concern Present (08/06/2023)  Tobacco Use: High Risk (12/07/2023)  Health Literacy: Adequate Health Literacy (08/06/2023)     Readmission Risk Interventions     No data to display

## 2023-12-19 NOTE — Discharge Summary (Addendum)
 Physician Discharge Summary   Patient: Robert Ramirez MRN: [id number] DOB: 1946-02-25  Admit date:     12/07/2023  Discharge date: 12/19/23  Discharge Physician: Reyes VEAR Gaw   PCP: Lorren Greig PARAS, NP   Recommendations at discharge:   Patient was discharged home with new medication of torsemide  20 mg daily for diuresis. While in house, he was started on Diamox  for contraction alkalosis.  However this was held at discharge because patient takes metformin  at home and there is risk of interaction of lactic acidosis with Diamox  plus metformin .  Should have follow up BMP to ensure continued improvement in bicarb.   He was evidently written for spironolactone  25 mg daily but was only taking this 12.5 mg daily and we have continued this dosage to discharge. Patient saw palliative care while in house and family requested [facility] palliative services at home after discharge  Discharge Diagnoses: Principal Problem:   (HFpEF) heart failure with preserved ejection fraction (HCC) Active Problems:   Cellulitis   Elevated troponin   SIRS (systemic inflammatory response syndrome) (HCC)   Uncontrolled type 2 diabetes mellitus with hyperglycemia, with long-term current use of insulin  (HCC)   Acute kidney injury superimposed on chronic kidney disease (HCC)   Hypertension associated with diabetes (HCC)   History of permanent cardiac pacemaker placement   Acquired hypothyroidism   OSA treated with BiPAP   Acute on chronic right-sided heart failure (HCC)   Protein-calorie malnutrition, severe  Resolved Problems:   * No resolved hospital problems. *  Hospital Course: 78 y.o. male with a PMH significant for type 2 diabetes mellitus, CAD, diastolic CHF, RV failure, OSA/OHS on BiPAP, PPM, history of asbestos exposure with pleural plaque, presented to the ED with lower extremity edema and weakness, admitted with CHF exacerbation and cellulitis.  He was initially started on diuresis and antibiotics  for his cellulitis.  On 7/16 his hospitalization was complicated by worsening AKI, lethargy, vancomycin  discontinued as thought to be worsening AKI.  Had right heart cath that day noted RA pressure of 16, PA mean 34, wedge 11, cardiac index 1.8> ABG with severe respiratory acidosis, hypercarbia> transferred to ICU and started on BiPAP therapy, which she struggled to tolerate while in house.  After stabilizing in the ICU, during which she was treated with IV diuretics, norepinephrine , milrinone , he was transferred back to the hospitalist service on 12/14/2023.  Palliative care consulted due to the degree of his illness/comorbidities and both patient and family request full code and full scope care.  He did have core track feeding tube placed while in the ICU and this was removed on 12/16/2023 and he was able to tolerate heart healthy p.o. diet after removal.  Patient continued to improve.  PT saw patient and recommended SNF at rehab but patient refused.  Therefore he will be set up with home health physical therapy at discharge.  Discharged home on oral torsemide  daily as above to help with continued diuresis.  On day of discharge he was noted to still have oxygen requirements with exertion after ambulatory pulse ox obtained and therefore he was also discharged home with short-term course of oxygen with recommendations to follow-up with PCP.   Assessment & Plan: Acute on chronic HFpEF, RV failure, pulmonary HTN:  -Echo with EF 60-65%, flattened IV septum, RV enlarged, RVSP 58, moderate TR - Followed by heart failure team, not a candidate for advanced therapies, unlikely to have benefit from oral vasodilators, cards signed off -They will arrange follow-up in pulmonary hypertension  clinic, transitioned to oral diuretics - Started on Diamox  in house for contraction alkalosis and CO2 improved while in house--> this was held at discharge due to concern for lactic acidosis when combined with metformin  which she is  on outpatient. - PT OT eval completed, SNF recommended, patient refused SNF and therefore home health PT services set up   Toxic encephalopathy, hypercarbia Delirium - ABG 7/16 noted severe respiratory acidosis, improved with BiPAP -Unfortunately with poor compliance again, emphasized -BIPAP at bedtime is critical> improved, finally home BiPAP in the room and patient was compliant last night   Dysphagia -Core track feeding tube removed -SLP following, tolerating dysphagia 2 diet    Sepsis due to cellulitis of RLE: - Improved, now off antibiotics   Type 2 diabetes mellitus - Lantus  25 units subQ daily   CHB s/p PPM:  -Stable   Hypothyroidism: TSH wnl.  - Continue synthroid .    AKI on stage IIIa CKD:  - Likely cardiorenal, resolved   CAD, HLD: Troponin elevation but no chest pain.  - Continue statin/zetia    Chronic respiratory failure OSA OHS History of asbestos exposure and known pleural plaques - would likely benefit from CPAP/bipap at home as well.        Consultants: Cardiology, critical care Disposition: Home Diet recommendation:  Discharge Diet Orders (From admission, onward)     Start     Ordered   12/19/23 0000  Diet - low sodium heart healthy        12/19/23 1453           Soft, heart healthy diet DISCHARGE MEDICATION: Allergies as of 12/19/2023   No Known Allergies      Medication List     TAKE these medications    acetaZOLAMIDE  250 MG tablet Commonly known as: DIAMOX  Take 1 tablet (250 mg total) by mouth daily.   amLODipine  10 MG tablet Commonly known as: NORVASC  Take 1 tablet (10 mg total) by mouth daily.   aspirin  EC 81 MG tablet Take 81 mg by mouth daily. Swallow whole.   atorvastatin  40 MG tablet Commonly known as: LIPITOR Take 40 mg by mouth daily.   blood glucose meter kit and supplies Dispense based on patient and insurance preference. Use up to four times daily as directed. (FOR ICD-10 E10.9, E11.9).   chlorthalidone   25 MG tablet Commonly known as: HYGROTON  TAKE 1 TABLET EVERY DAY   Droplet Pen Needles 31G X 8 MM Misc Generic drug: Insulin  Pen Needle 1 each by Other route 2 (two) times daily with a meal.   empagliflozin  10 MG Tabs tablet Commonly known as: Jardiance  Take 1 tablet (10 mg total) by mouth daily before breakfast.   ezetimibe  10 MG tablet Commonly known as: Zetia  Take 1 tablet (10 mg total) by mouth daily.   HumuLIN  N KwikPen 100 UNIT/ML KwikPen Generic drug: Insulin  NPH (Human) (Isophane) Inject 33 Units into the skin in the morning and at bedtime.   levothyroxine  50 MCG tablet Commonly known as: SYNTHROID  Take 1 tablet (50 mcg total) by mouth daily.   lisinopril  40 MG tablet Commonly known as: ZESTRIL  TAKE 1 TABLET EVERY DAY   metFORMIN  1000 MG tablet Commonly known as: GLUCOPHAGE  Take 1 tablet (1,000 mg total) by mouth 2 (two) times daily with a meal.   nitroGLYCERIN  0.4 MG SL tablet Commonly known as: NITROSTAT  Place 1 tablet (0.4 mg total) under the tongue every 5 (five) minutes as needed for chest pain.   spironolactone  25 MG tablet Commonly  known as: ALDACTONE  Take 0.5 tablets (12.5 mg total) by mouth daily.   torsemide  20 MG tablet Commonly known as: DEMADEX  Take 1 tablet (20 mg total) by mouth daily. Start taking on: December 20, 2023   vitamin B-12 500 MCG tablet Commonly known as: CYANOCOBALAMIN  Take 500 mcg by mouth daily.   Vitamin D-3 25 MCG (1000 UT) Caps Take 1 capsule by mouth every morning.        Follow-up Information     [facility] Follow up on 12/29/2023.   Specialty: Cardiology Why: Follow up in the Advanced Heart Failure Clinic 8/5 at 11 am Contact information: [address] [place]  [id number] [phone]               Discharge Exam: Fredricka Weights   12/17/23 0443 12/18/23 0527 12/19/23 0624  Weight: 71.5 kg 73.5 kg 74.4 kg   Gen: 78 y.o. male in no apparent  distress.  Nontoxic, wearing nasal cannula, seen while ambulating in hall.  Chronically ill-appearing, thin and frail appearing but in good spirits Pulm: Non-labored breathing.  Few scattered rhonchi at bases. CV: Regular rate and rhythm.  GI: Abdomen soft, non-tender, non-distended, with normoactive bowel sounds. No organomegaly or masses felt. Ext: Warm, no deformities, no pedal edema Skin: No rashes, lesions  Neuro: Alert and oriented. No focal neurological deficits. Psych: Calm  Judgement and insight appear normal. Mood & affect appropriate.    Condition at discharge: fair  The results of significant diagnostics from this hospitalization (including imaging, microbiology, ancillary and laboratory) are listed below for reference.   Imaging Studies: DG Swallowing Func-Speech Pathology Result Date: 12/18/2023 Table formatting from the original result was not included. Modified Barium Swallow Study Patient Details Name: Robert Ramirez MRN: [id number] Date of Birth: [birth year] Today's Date: 12/18/2023 HPI/PMH: HPI: Pt is a 78 y.o. M presenting to [facility] on 12/07/23 w/ leg weakness and swelling. Admitted for heart failure, pulmonary HTN, and cellulitis. Pt was initially on heart healthy diet but was made NPO 7/16 for RHC and kept NPO due to AMS and respiratory failure. PMH is significant for HTN, HLD, HFpEF, heart block s/p PPM, and DMT2. Clinical Impression: Clinical Impression: Pt shows mild improvements and is suspected to be closer to his baseline level of function. He still has impaired timing as well as reduced hyolaryngeal movement and larygneal vestibule closure that result in penetration of both thin (PAS 5) and nectar thick (PAS 3) liquids. No aspiration was observed throughout the study though. Although penetration could not be prevented with swallowing strategies, the majority could be ejected with a cued throat clear (more effective than a cued cough). Pt comments that he clears his throat  frequently when drinking liquids at baseline. Given his overall improvements this admission and that he has frank penetration of both thin and nectar thick liquids, recommend that he advance to Dys 3 diet with thin liquids. He would still benefit from ongoing therapy to encourage use of strategies (small sips, throat clear after sips) as well as exercises to target pharyngeal physiology and more effective airway protection. Factors that may increase risk of adverse event in presence of aspiration Noe & Lianne 2021): Factors that may increase risk of adverse event in presence of aspiration Noe & Lianne 2021): Poor general health and/or compromised immunity; Respiratory or GI disease; Frail or deconditioned; Weak cough Recommendations/Plan: Swallowing Evaluation Recommendations Swallowing Evaluation Recommendations Recommendations: PO diet PO Diet Recommendation: Dysphagia 3 (Mechanical soft); Thin  liquids (Level 0) Liquid Administration via: Cup Medication Administration: Crushed with puree Supervision: Patient able to self-feed; Full supervision/cueing for swallowing strategies Swallowing strategies  : Minimize environmental distractions; Slow rate; Small bites/sips; Avoid mixed consistencies; Clear throat intermittently Postural changes: Position pt fully upright for meals; Stay upright 30-60 min after meals Oral care recommendations: Oral care BID (2x/day) Treatment Plan Treatment Plan Treatment recommendations: Therapy as outlined in treatment plan below Follow-up recommendations: Skilled nursing-short term rehab (<3 hours/day) Functional status assessment: Patient has had a recent decline in their functional status and demonstrates the ability to make significant improvements in function in a reasonable and predictable amount of time. Treatment frequency: Min 2x/week Treatment duration: 2 weeks Interventions: Aspiration precaution training; Patient/family education; Compensatory techniques;  Oropharyngeal exercises; Respiratory muscle strength training; Diet toleration management by SLP Recommendations Recommendations for follow up therapy are one component of a multi-disciplinary discharge planning process, led by the attending physician.  Recommendations may be updated based on patient status, additional functional criteria and insurance authorization. Assessment: Orofacial Exam: Orofacial Exam Oral Cavity - Dentition: Edentulous Anatomy: Anatomy: Suspected cervical osteophytes Boluses Administered: Boluses Administered Boluses Administered: Thin liquids (Level 0); Mildly thick liquids (Level 2, nectar thick); Puree; Solid  Oral Impairment Domain: Oral Impairment Domain Lip Closure: No labial escape Tongue control during bolus hold: Cohesive bolus between tongue to palatal seal Bolus preparation/mastication: Timely and efficient chewing and mashing Bolus transport/lingual motion: Brisk tongue motion Oral residue: Trace residue lining oral structures Location of oral residue : Tongue Initiation of pharyngeal swallow : Pyriform sinuses  Pharyngeal Impairment Domain: Pharyngeal Impairment Domain Soft palate elevation: No bolus between soft palate (SP)/pharyngeal wall (PW) Laryngeal elevation: Partial superior movement of thyroid  cartilage/partial approximation of arytenoids to epiglottic petiole Anterior hyoid excursion: Partial anterior movement Epiglottic movement: Complete inversion Laryngeal vestibule closure: Incomplete, narrow column air/contrast in laryngeal vestibule Pharyngeal stripping wave : Present - complete Pharyngeal contraction (A/P view only): N/A Pharyngoesophageal segment opening: Partial distention/partial duration, partial obstruction of flow Tongue base retraction: No contrast between tongue base and posterior pharyngeal wall (PPW) Pharyngeal residue: Collection of residue within or on pharyngeal structures Location of pharyngeal residue: Valleculae  Esophageal Impairment Domain: No  data recorded Pill: No data recorded Penetration/Aspiration Scale Score: Penetration/Aspiration Scale Score 1.  Material does not enter airway: Puree 3.  Material enters airway, remains ABOVE vocal cords and not ejected out: Mildly thick liquids (Level 2, nectar thick) 5.  Material enters airway, CONTACTS cords and not ejected out: Thin liquids (Level 0) Compensatory Strategies: Compensatory Strategies Compensatory strategies: Yes Straw: Ineffective Ineffective Straw: Thin liquid (Level 0) Chin tuck: Ineffective Ineffective Chin Tuck: Thin liquid (Level 0)   General Information: Caregiver present: No  Diet Prior to this Study: Dysphagia 2 (finely chopped); Mildly thick liquids (Level 2, nectar thick)   Temperature : Normal   Respiratory Status: WFL   Supplemental O2: Nasal cannula   History of Recent Intubation: No  Behavior/Cognition: Alert; Cooperative; Requires cueing Self-Feeding Abilities: Able to self-feed Baseline vocal quality/speech: Normal Volitional Cough: Able to elicit Volitional Swallow: Able to elicit Exam Limitations: No limitations Goal Planning: Prognosis for improved oropharyngeal function: Good Barriers to Reach Goals: Time post onset No data recorded Patient/Family Stated Goal: asking for POs Consulted and agree with results and recommendations: Patient Pain: Pain Assessment Pain Assessment: Faces Faces Pain Scale: 0 End of Session: Start Time:SLP Start Time (ACUTE ONLY): 9074 Stop Time: SLP Stop Time (ACUTE ONLY): 0942 Time Calculation:SLP Time Calculation (min) (ACUTE ONLY): 17  min Charges: SLP Evaluations $ SLP Speech Visit: 1 Visit SLP Evaluations $MBS Swallow: 1 Procedure $Swallowing Treatment: 1 Procedure SLP visit diagnosis: SLP Visit Diagnosis: Dysphagia, oropharyngeal phase (R13.12) Past Medical History: Past Medical History: Diagnosis Date  Diabetes mellitus   High cholesterol   Hypertension  Past Surgical History: Past Surgical History: Procedure Laterality Date  APPENDECTOMY     ARTERIAL LINE INSERTION N/A 12/09/2023  Procedure: ARTERIAL LINE INSERTION;  Surgeon: Cherrie Toribio SAUNDERS, MD;  Location: [facility] INVASIVE CV LAB;  Service: Cardiovascular;  Laterality: N/A;  CENTRAL LINE INSERTION  12/09/2023  Procedure: CENTRAL LINE INSERTION;  Surgeon: Cherrie Toribio SAUNDERS, MD;  Location: [facility];  Service: Cardiovascular;;  PACEMAKER IMPLANT N/A 06/12/2023  Procedure: PACEMAKER IMPLANT;  Surgeon: Kennyth Chew, MD;  Location: [facility] INVASIVE CV LAB;  Service: Cardiovascular;  Laterality: N/A;  RIGHT HEART CATH N/A 12/09/2023  Procedure: RIGHT HEART CATH;  Surgeon: Cherrie Toribio SAUNDERS, MD;  Location: [facility]AS[facility] CV [facility];  Service: Cardiovascular;  Laterality: N/A;  RIGHT/LEFT HEART CATH AND CORONARY ANGIOGRAPHY N/A 03/17/2018  Procedure: RIGHT/LEFT HEART CATH AND CORONARY ANGIOGRAPHY;  Surgeon: Claudene Pacific, MD;  Location: [facility] INVAS[facility] CV [facility];  Service: Cardiovascular;  Laterality: N/A; Leita SAILOR., M.A. CCC-SLP Acute Rehabilitation Services Office: [phone] Secure chat preferred 12/18/2023, 10:31 AM  DG Swallowing Func-Speech Pathology Result Date: 12/14/2023 Table formatting from the original result was not included. Modified Barium Swallow Study Patient Details Name: Gayle Collard MRN: [id number] Date of Birth: [birth year] Today's Date: 12/14/2023 HPI/PMH: HPI: Pt is a 78 y.o. M presenting [facility] on 12/07/23 w/ leg weakness and swelling. Admitted for heart failure, pulmonary HTN, and cellulitis. Pt was initially on heart healthy diet but was made NPO 7/16 for RHC and kept NPO due to AMS and respiratory failure. PMH is significant for HTN, HLD, HFpEF, heart block s/p PPM, and DMT2. Clinical Impression: Clinical Impression: Pt has a pharyngeal more than oral dysphagia with incomplete hyolaryngeal movement, epiglottic inversion, and laryngeal vestibule closure that results in incomplete airway protection during the swallow. He has deep penetration to the vocal folds (PAS 5) and silent aspiration  (PAS 8) with thin liquids, which does not clear with a cued cough. He still aspirates when using a chin tuck and a supraglottic swallow helps to reduce some of the aspiration but does not fully protect the airway. Transient aspiration occurred with nectar thick liquids x1 (PAS 6) only with the barium tablet, but otherwise he only had transient penetration (PAS 2). Mild pharyngeal residue was observed throughout most of the study due to reduced base of tongue retraction and UES opening, but the barium tablet also became lodged in the valleculae. It required a bolus of puree and a chin tuck to clear. Note that pt reports having some difficulty with mastication at baseline due to lack of dentition. Recommend starting with Dys 2 (finely chopped) diet and nectar thick liquids, avoiding mixed consistencies. SLP will f/u for trial of therapy to try to improve pharyngeal function and airway protection. Factors that may increase risk of adverse event in presence of aspiration Noe & Lianne 2021): Factors that may increase risk of adverse event in presence of aspiration Noe & Lianne 2021): Poor general health and/or compromised immunity; Respiratory or GI disease; Frail or deconditioned; Weak cough; Presence of tubes (ETT, trach, NG, etc.) Recommendations/Plan: Swallowing Evaluation Recommendations Swallowing Evaluation Recommendations Recommendations: PO diet PO Diet Recommendation: Dysphagia 1 (Pureed); Mildly thick liquids (Level 2, nectar thick) Liquid Administration via: Cup Medication Administration: Crushed  with puree Supervision: Patient able to self-feed; Full supervision/cueing for swallowing strategies Swallowing strategies  : Minimize environmental distractions; Slow rate; Small bites/sips; Follow solids with liquids; Avoid mixed consistencies Postural changes: Position pt fully upright for meals; Stay upright 30-60 min after meals Oral care recommendations: Oral care BID (2x/day) Caregiver  Recommendations: Avoid jello, ice cream, thin soups, popsicles; Remove water  pitcher Treatment Plan Treatment Plan Treatment recommendations: Therapy as outlined in treatment plan below Follow-up recommendations: Skilled nursing-short term rehab (<3 hours/day) Functional status assessment: Patient has had a recent decline in their functional status and demonstrates the ability to make significant improvements in function in a reasonable and predictable amount of time. Treatment frequency: Min 2x/week Treatment duration: 2 weeks Interventions: Aspiration precaution training; Patient/family education; Trials of upgraded texture/liquids; Compensatory techniques; Oropharyngeal exercises; Respiratory muscle strength training; Diet toleration management by SLP Recommendations Recommendations for follow up therapy are one component of a multi-disciplinary discharge planning process, led by the attending physician.  Recommendations may be updated based on patient status, additional functional criteria and insurance authorization. Assessment: Orofacial Exam: Orofacial Exam Oral Cavity - Dentition: Edentulous Anatomy: Anatomy: Suspected cervical osteophytes Boluses Administered: Boluses Administered Boluses Administered: Thin liquids (Level 0); Mildly thick liquids (Level 2, nectar thick); Moderately thick liquids (Level 3, honey thick); Puree; Solid  Oral Impairment Domain: Oral Impairment Domain Lip Closure: No labial escape Tongue control during bolus hold: Cohesive bolus between tongue to palatal seal Bolus transport/lingual motion: Brisk tongue motion Oral residue: Trace residue lining oral structures Location of oral residue : Tongue Initiation of pharyngeal swallow : Posterior angle of the ramus  Pharyngeal Impairment Domain: Pharyngeal Impairment Domain Soft palate elevation: No bolus between soft palate (SP)/pharyngeal wall (PW) Laryngeal elevation: Partial superior movement of thyroid  cartilage/partial approximation  of arytenoids to epiglottic petiole Anterior hyoid excursion: Partial anterior movement Epiglottic movement: Partial inversion Laryngeal vestibule closure: Incomplete, narrow column air/contrast in laryngeal vestibule Pharyngeal stripping wave : Present - complete Pharyngeal contraction (A/P view only): N/A Pharyngoesophageal segment opening: Partial distention/partial duration, partial obstruction of flow Tongue base retraction: Trace column of contrast or air between tongue base and PPW Pharyngeal residue: Collection of residue within or on pharyngeal structures Location of pharyngeal residue: Valleculae; Pyriform sinuses  Esophageal Impairment Domain: Esophageal Impairment Domain Esophageal clearance upright position: Esophageal retention Pill: Pill Consistency administered: Mildly thick liquids (Level 2, nectar thick); Puree Mildly thick liquids (Level 2, nectar thick): Impaired (see clinical impressions) Puree: Impaired (see clinical impressions) Penetration/Aspiration Scale Score: Penetration/Aspiration Scale Score 1.  Material does not enter airway: Moderately thick liquids (Level 3, honey thick); Puree; Solid; Pill 2.  Material enters airway, remains ABOVE vocal cords then ejected out: Mildly thick liquids (Level 2, nectar thick) 6.  Material enters airway, passes BELOW cords then ejected out: Mildly thick liquids (Level 2, nectar thick) (x1 with barium tablet) 8.  Material enters airway, passes BELOW cords without attempt by patient to eject out (silent aspiration) : Thin liquids (Level 0) Compensatory Strategies: Compensatory Strategies Compensatory strategies: Yes Straw: Ineffective Ineffective Straw: Thin liquid (Level 0) Effortful swallow: Ineffective Ineffective Effortful Swallow: Pill Chin tuck: Ineffective; Effective Effective Chin Tuck: Pill Ineffective Chin Tuck: Thin liquid (Level 0) Liquid wash: Ineffective Ineffective Liquid Wash: Pill Supraglottic swallow: Ineffective Ineffective Supraglottic  Swallow: Thin liquid (Level 0)   General Information: Caregiver present: Yes Banker)  Diet Prior to this Study: NPO; Cortrak/Small bore NG tube   Temperature : Normal   Respiratory Status: WFL   Supplemental O2: Nasal cannula  History of Recent Intubation: No  Behavior/Cognition: Alert; Cooperative; Requires cueing Self-Feeding Abilities: Able to self-feed Baseline vocal quality/speech: Normal Volitional Cough: Able to elicit Volitional Swallow: Able to elicit Exam Limitations: No limitations Goal Planning: Prognosis for improved oropharyngeal function: Good Barriers to Reach Goals: Cognitive deficits No data recorded Patient/Family Stated Goal: asking for POs Consulted and agree with results and recommendations: Patient; Nurse Pain: Pain Assessment Pain Assessment: Faces Faces Pain Scale: 0 End of Session: Start Time:SLP Start Time (ACUTE ONLY): 1207 Stop Time: SLP Stop Time (ACUTE ONLY): 1224 Time Calculation:SLP Time Calculation (min) (ACUTE ONLY): 17 min Charges: SLP Evaluations $ SLP Speech Visit: 1 Visit SLP Evaluations $MBS Swallow: 1 Procedure $Swallowing Treatment: 1 Procedure SLP visit diagnosis: SLP Visit Diagnosis: Dysphagia, pharyngeal phase (R13.13) Past Medical History: Past Medical History: Diagnosis Date  Diabetes mellitus   High cholesterol   Hypertension  Past Surgical History: Past Surgical History: Procedure Laterality Date  APPENDECTOMY    ARTERIAL LINE INSERTION N/A 12/09/2023  Procedure: ARTERIAL LINE INSERTION;  Surgeon: Cherrie Toribio SAUNDERS, MD;  Location: [facility];  Service: Cardiovascular;  Laterality: N/A;  CENTRAL LINE INSERTION  12/09/2023  Procedure: CENTRAL LINE INSERTION;  Surgeon: Cherrie Toribio SAUNDERS, MD;  Location: [facility];  Service: Cardiovascular;;  PACEMAKER IMPLANT N/A 06/12/2023  Procedure: PACEMAKER IMPLANT;  Surgeon: Kennyth Chew, MD;  Location: [facility];  Service: Cardiovascular;  Laterality: N/A;  RIGHT HEART CATH N/A 12/09/2023  Procedure: RIGHT  HEART CATH;  Surgeon: Cherrie Toribio SAUNDERS, MD;  Location: [facility];  Service: Cardiovascular;  Laterality: N/A;  RIGHT/LEFT HEART CATH AND CORONARY ANGIOGRAPHY N/A 03/17/2018  Procedure: RIGHT/LEFT HEART CATH AND CORONARY ANGIOGRAPHY;  Surgeon: Claudene Pacific, MD;  Location: [facility];  Service: Cardiovascular;  Laterality: N/A; Leita SAILOR., M.A. CCC-SLP Acute Rehabilitation Services Office: [phone] Secure chat preferred 12/14/2023, 1:24 PM  DG Abd Portable 1V Result Date: 12/11/2023 CLINICAL DATA:  Feeding tube placement. EXAM: PORTABLE ABDOMEN - 1 VIEW COMPARISON:  None Available. FINDINGS: Tip of the weighted enteric tube below the diaphragm in the left upper quadrant, in the region of the mid stomach. Moderate volume of stool in the included colon. IMPRESSION: Tip of the weighted enteric tube below the diaphragm in the region of the mid stomach. Electronically Signed   By: Andrea Gasman M.D.   On: 12/11/2023 13:31   VAS US  LOWER EXTREMITY VENOUS (DVT) Result Date: 12/10/2023  Lower Venous DVT Study Patient Name:  Robert Ramirez  Date of Exam:   12/10/2023 Medical Rec #: [id number]        Accession #:    [id number] Date of Birth: 07/02/1945         Patient Gender: M Patient Age:   72 years Exam Location:  [facility] Procedure:      VAS US  LOWER EXTREMITY VENOUS (DVT) Referring Phys: TINNIE FURTH --------------------------------------------------------------------------------  Indications: Swelling, and Edema.  Limitations: Involuntary leg movement. Performing Technologist: Elmarie Lindau, RVT  Examination Guidelines: A complete evaluation includes B-mode imaging, spectral Doppler, color Doppler, and power Doppler as needed of all accessible portions of each vessel. Bilateral testing is considered an integral part of a complete examination. Limited examinations for reoccurring indications may be performed as noted. The reflux portion of the exam is performed with the patient in  reverse Trendelenburg.  +---------+---------------+---------+-----------+----------+--------------+ RIGHT    CompressibilityPhasicitySpontaneityPropertiesThrombus Aging +---------+---------------+---------+-----------+----------+--------------+ CFV      Full  Yes      Yes                                 +---------+---------------+---------+-----------+----------+--------------+ SFJ      Full                                                        +---------+---------------+---------+-----------+----------+--------------+ FV Prox  Full                                                        +---------+---------------+---------+-----------+----------+--------------+ FV Mid   Full                                                        +---------+---------------+---------+-----------+----------+--------------+ FV DistalFull                                                        +---------+---------------+---------+-----------+----------+--------------+ PFV      Full                                                        +---------+---------------+---------+-----------+----------+--------------+ POP      Full           Yes      Yes                                 +---------+---------------+---------+-----------+----------+--------------+ PTV      Full                                                        +---------+---------------+---------+-----------+----------+--------------+ PERO     Full                                                        +---------+---------------+---------+-----------+----------+--------------+   +---------+---------------+---------+-----------+----------+--------------+ LEFT     CompressibilityPhasicitySpontaneityPropertiesThrombus Aging +---------+---------------+---------+-----------+----------+--------------+ CFV      Full           Yes      Yes                                  +---------+---------------+---------+-----------+----------+--------------+ SFJ  Full                                                        +---------+---------------+---------+-----------+----------+--------------+ FV Prox  Full                                                        +---------+---------------+---------+-----------+----------+--------------+ FV Mid   Full                                                        +---------+---------------+---------+-----------+----------+--------------+ FV DistalFull                                                        +---------+---------------+---------+-----------+----------+--------------+ PFV      Full                                                        +---------+---------------+---------+-----------+----------+--------------+ POP      Full           Yes      Yes                                 +---------+---------------+---------+-----------+----------+--------------+ PTV      Full                                                        +---------+---------------+---------+-----------+----------+--------------+ PERO     Full                                                        +---------+---------------+---------+-----------+----------+--------------+     Summary: RIGHT: - There is no evidence of deep vein thrombosis in the lower extremity.  - No cystic structure found in the popliteal fossa.  LEFT: - There is no evidence of deep vein thrombosis in the lower extremity.  - No cystic structure found in the popliteal fossa.  *See table(s) above for measurements and observations. Electronically signed by Debby Thielman on 12/10/2023 at 5:04:52 PM.    Final    DG CHEST PORT 1 VIEW Result Date: 12/09/2023 CLINICAL DATA:  Central line EXAM: PORTABLE CHEST 1 VIEW COMPARISON:  12/07/2023, chest CT 06/08/2018 FINDINGS: Left-sided pacing device as before. Right IJ central venous catheter with tip at the  level of SVC. No pneumothorax. Extensive calcified pleural plaques. Small bilateral pleural effusions and mild airspace disease at the bases. Calcified hilar and mediastinal nodes consistent with prior granulomatous disease. IMPRESSION: 1. Right IJ central venous catheter with tip at the level of SVC. No pneumothorax. 2. Small bilateral pleural effusions and mild airspace disease at the bases, atelectasis versus pneumonia. 3. Extensive calcified pleural plaques. Electronically Signed   By: Luke Bun M.D.   On: 12/09/2023 20:07   CARDIAC CATHETERIZATION Result Date: 12/09/2023 Findings: RA = 16 RV = 54/17 PA =  48/26 (34) PCW = 11 Thermodilution CO/CI = 3.8/1.8 Fick cardiac output/index = unable to read due to low reading PVR = 6.0 Ao sat = 97% PA sat = n/a PAPi = 1.4 ABG results: &.12/106/125/97% Assessment: 1. Pulmonary HTN with advanced RV failure 2. Hypercapneic respiratory failure Plan/Discussion: Overall, appears end-stage. D/w CCM. Will move to ICU, start bipap and assess response. 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 Doubt much benefit from pulmonary vasodilators.  Toribio Fuel, MD 4:35 PM  US  Abdomen Limited RUQ (LIVER/GB) Result Date: 12/08/2023 CLINICAL DATA:  Elevated bili Rue EXAM: ULTRASOUND ABDOMEN LIMITED RIGHT UPPER QUADRANT COMPARISON:  None Available. FINDINGS: Gallbladder: Gallbladder is well distended. Gallstones are seen without complicating factors. Negative sonographic Murphy's sign is noted. Common bile duct: Diameter: 3 mm Liver: No focal lesion identified. Within normal limits in parenchymal echogenicity. Portal vein is patent on color Doppler imaging with normal direction of blood flow towards the liver. Other: None. IMPRESSION: Cholelithiasis without complicating factors. Electronically Signed   By: Oneil Devonshire M.D.   On: 12/08/2023 23:59   CUP PACEART REMOTE DEVICE CHECK Result Date: 12/08/2023 PPM Scheduled remote reviewed. Normal device function.  Presenting rhythm:  AS/VP  Next remote 91 days. LA, CVRS  ECHOCARDIOGRAM COMPLETE Result Date: 12/07/2023    ECHOCARDIOGRAM REPORT   Patient Name:   Robert Ramirez Date of Exam: 12/07/2023 Medical Rec #:  [id number]       Height:       73.0 in Accession #:    [id number]      Weight:       175.0 lb Date of Birth:  26-May-1946        BSA:          2.033 m Patient Age:    77 years        BP:           116/74 mmHg Patient Gender: M               HR:           88 bpm. Exam Location:  Inpatient Procedure: 2D Echo, Cardiac Doppler and Color Doppler (Both Spectral and Color            Flow Doppler were utilized during procedure). Indications:    Congestive Heart Failure I50.9  History:        Patient has prior history of Echocardiogram examinations, most                 recent 06/10/2023. HFpEF, CAD, COPD; Risk Factors:Hypertension,                 Diabetes, Former Smoker and Dyslipidemia.  Sonographer:    Koleen Popper RDCS Referring Phys: [id number] RONDELL A SMITH  Sonographer Comments: Suboptimal apical window. Image acquisition challenging due to respiratory motion. IMPRESSIONS  1. Left ventricular ejection fraction, by estimation, is 60 to 65%. The left ventricle has normal function. The left  ventricle has no regional wall motion abnormalities. There is mild left ventricular hypertrophy. Left ventricular diastolic parameters were normal. There is the interventricular septum is flattened in systole and diastole, consistent with right ventricular pressure and volume overload.  2. Right ventricular systolic function is normal. The right ventricular size is enlarged. Mildly increased right ventricular wall thickness. There is moderately elevated pulmonary artery systolic pressure. The estimated right ventricular systolic pressure is 58.3 mmHg.  3. The mitral valve is degenerative. No evidence of mitral valve regurgitation. No evidence of mitral stenosis.  4. Tricuspid valve regurgitation is moderate.  5. The aortic valve is normal in structure. Aortic  valve regurgitation is not visualized. No aortic stenosis is present.  6. The inferior vena cava is dilated in size with <50% respiratory variability, suggesting right atrial pressure of 15 mmHg. Comparison(s): A prior study was performed on 06/10/2023. Interventricular septum is flattened in systole and diastole, consistent with right ventricular pressure and volume overload and elevated RAP are new findings. FINDINGS  Left Ventricle: Left ventricular ejection fraction, by estimation, is 60 to 65%. The left ventricle has normal function. The left ventricle has no regional wall motion abnormalities. The left ventricular internal cavity size was normal in size. There is  mild left ventricular hypertrophy. The interventricular septum is flattened in systole and diastole, consistent with right ventricular pressure and volume overload. Left ventricular diastolic parameters were normal. Right Ventricle: The right ventricular size is enlarged. Mildly increased right ventricular wall thickness. Right ventricular systolic function is normal. There is moderately elevated pulmonary artery systolic pressure. The tricuspid regurgitant velocity  is 3.29 m/s, and with an assumed right atrial pressure of 15 mmHg, the estimated right ventricular systolic pressure is 58.3 mmHg. Left Atrium: Left atrial size was normal in size. Right Atrium: Right atrial size was normal in size. Pericardium: There is no evidence of pericardial effusion. Mitral Valve: The mitral valve is degenerative in appearance. There is moderate thickening of the mitral valve leaflet(s). Normal mobility of the mitral valve leaflets. No evidence of mitral valve regurgitation. No evidence of mitral valve stenosis. Tricuspid Valve: The tricuspid valve is normal in structure. Tricuspid valve regurgitation is moderate . No evidence of tricuspid stenosis. Aortic Valve: The aortic valve is normal in structure. Aortic valve regurgitation is not visualized. No aortic  stenosis is present. Pulmonic Valve: The pulmonic valve was normal in structure. Pulmonic valve regurgitation is mild. No evidence of pulmonic stenosis. Aorta: The aortic root and ascending aorta are structurally normal, with no evidence of dilitation. Venous: The inferior vena cava is dilated in size with less than 50% respiratory variability, suggesting right atrial pressure of 15 mmHg. IAS/Shunts: The atrial septum is grossly normal. Additional Comments: A device lead is visualized.  LEFT VENTRICLE PLAX 2D LVIDd:         4.40 cm   Diastology LVIDs:         3.20 cm   LV e' medial:    7.18 cm/s LV PW:         1.10 cm   LV E/e' medial:  5.1 LV IVS:        1.40 cm   LV e' lateral:   8.92 cm/s LVOT diam:     1.90 cm   LV E/e' lateral: 4.1 LV SV:         40 LV SV Index:   20 LVOT Area:     2.84 cm  RIGHT VENTRICLE  IVC RV Basal diam:  5.70 cm     IVC diam: 2.70 cm RV Mid diam:    5.40 cm RV S prime:     11.60 cm/s TAPSE (M-mode): 2.2 cm LEFT ATRIUM         Index       RIGHT ATRIUM           Index LA diam:    2.10 cm 1.03 cm/m  RA Area:     20.80 cm                                 RA Volume:   64.90 ml  31.92 ml/m  AORTIC VALVE LVOT Vmax:   93.20 cm/s LVOT Vmean:  59.900 cm/s LVOT VTI:    0.140 m  AORTA Ao Root diam: 2.80 cm Ao Asc diam:  3.30 cm MITRAL VALVE               TRICUSPID VALVE MV Area (PHT): 3.72 cm    TR Peak grad:   43.3 mmHg MV Decel Time: 204 msec    TR Vmax:        329.00 cm/s MV E velocity: 36.50 cm/s MV A velocity: 88.30 cm/s  SHUNTS MV E/A ratio:  0.41        Systemic VTI:  0.14 m                            Systemic Diam: 1.90 cm Sunit Tolia Electronically signed by Madonna Large Signature Date/Time: 12/07/2023/6:19:16 PM    Final    DG Chest Port 1 View Result Date: 12/07/2023 CLINICAL DATA:  Shortness of breath with generalized weakness. EXAM: PORTABLE CHEST 1 VIEW COMPARISON:  Radiographs 06/13/2023 and 01/08/2023. Chest CT 06/08/2018. FINDINGS: 0800 hours. Left subclavian  pacemaker leads appear unchanged, projecting over the right atrium and right ventricle. The heart size and mediastinal contours are stable. Chronic calcified mediastinal and hilar lymph nodes and chronic calcified pleural plaque formation again noted bilaterally. No definite superimposed airspace disease, edema, pneumothorax or significant pleural effusion. There is probable mild bibasilar atelectasis. No significant osseous findings are identified. IMPRESSION: No definite acute cardiopulmonary process. Chronic calcified pleural plaque formation and calcified mediastinal and hilar lymph nodes. Electronically Signed   By: Elsie Perone M.D.   On: 12/07/2023 08:37    Microbiology: Results for orders placed or performed during the hospital encounter of 12/07/23  MRSA Next Gen by PCR, Nasal     Status: None   Collection Time: 12/09/23  2:03 PM   Specimen: Nasal Mucosa; Nasal Swab  Result Value Ref Range Status   MRSA by PCR Next Gen NOT DETECTED NOT DETECTED Final    Comment: (NOTE) The GeneXpert MRSA Assay (FDA approved for NASAL specimens only), is one component of a comprehensive MRSA colonization surveillance program. It is not intended to diagnose MRSA infection nor to guide or monitor treatment for MRSA infections. Test performance is not FDA approved in patients less than 21 years old. Performed at [facility], [address] [place], [place] [address]     Labs: CBC: Recent Labs  Lab 12/15/23 0341 12/16/23 0404 12/17/23 0245 12/19/23 0337  WBC 8.2 9.8 9.7 8.4  HGB 14.6 14.3 15.0 14.6  HCT 49.0 46.8 49.6 47.5  MCV 96.1 95.3 94.7 93.9  PLT 151 175 201 193   Basic Metabolic Panel:  Recent Labs  Lab 12/13/23 0345 12/14/23 0350 12/15/23 0341 12/16/23 0404 12/17/23 0245 12/18/23 0230 12/19/23 0337  NA 140 143 144 142 142 141 136  K 4.1 4.1 3.8 4.0 3.8 3.9 3.4*  CL 89* 91* 88* 87* 87* 90* 90*  CO2 41* 43* >45* >45* 44* 42* 38*  GLUCOSE 174* 175* 190* 218* 146*  115* 104*  BUN 62* 53* 49* 54* 54* 61* 66*  CREATININE 1.40* 1.11 1.12 1.09 1.18 1.19 1.22  CALCIUM  10.0 10.3 10.2 10.2 10.4* 10.1 9.7  MG 2.3  --   --   --   --   --   --   PHOS 3.1 1.7*  --   --   --   --   --    Liver Function Tests: No results for input(s): AST, ALT, ALKPHOS, BILITOT, PROT, ALBUMIN in the last 168 hours. CBG: Recent Labs  Lab 12/18/23 1108 12/18/23 1616 12/18/23 2126 12/19/23 0633 12/19/23 1144  GLUCAP 229* 258* 142* 96 230*    Discharge time spent: less than 30 minutes.  Signed: Reyes VEAR Gaw, MD [facility] 12/19/2023

## 2023-12-21 ENCOUNTER — Telehealth: Payer: Self-pay | Admitting: *Deleted

## 2023-12-21 ENCOUNTER — Telehealth: Payer: Self-pay

## 2023-12-21 NOTE — Telephone Encounter (Signed)
 Copied from CRM [id number]. Topic: General - Other >> Dec 21, 2023 11:35 AM Sophia H wrote: Reason for CRM: Spoke with [facility]  Received a palliative request from the hospital but since the patient has discharged they need to let the PCP know that the patient is being followed by palliative care.

## 2023-12-21 NOTE — Telephone Encounter (Signed)
Noted. Please let me know if I can further assist. Thank you.

## 2023-12-21 NOTE — Telephone Encounter (Signed)
 Copied from CRM [id number]. Topic: Clinical - Order For Equipment >> Dec 21, 2023 10:01 AM Charlet HERO wrote: Reason for CRM: Patient was in hospital for 12 days and his legs have become weak and will need walker to get around home, [facility] will need a order sent fax [phone].

## 2023-12-21 NOTE — Transitions of Care (Post Inpatient/ED Visit) (Signed)
 12/21/2023  Name: Robert Ramirez MRN: [id number] DOB: December 03, 1945  Today's TOC FU Call Status: Today's TOC FU Call Status:: Successful TOC FU Call Completed TOC FU Call Complete Date: 12/21/23 Patient's Name and Date of Birth confirmed.  Transition Care Management Follow-up Telephone Call Date of Discharge: 12/19/23 Discharge Facility: [facility]) Type of Discharge: Inpatient Admission Primary Inpatient Discharge Diagnosis:: (HFpEF) heart failure with preserved ejection fraction How have you been since you were released from the hospital?: Better Any questions or concerns?: No  Items Reviewed: Did you receive and understand the discharge instructions provided?: Yes Medications obtained,verified, and reconciled?: Yes (Medications Reviewed) Any new allergies since your discharge?: No Dietary orders reviewed?: Yes Type of Diet Ordered:: low sodium heart healthy, carb modified Do you have support at home?: Yes People in Home [RPT]: spouse Name of Support/Comfort Primary Source: Janis/Spouse, Stepson/T[place]  Medications Reviewed Today: Medications Reviewed Today     Reviewed by Lucky Andrea LABOR, RN (Registered Nurse) on 12/21/23 at 1042  Med List Status: <None>   Medication Order Taking? Sig Documenting Provider Last Dose Status Informant  acetaZOLAMIDE  (DIAMOX ) 250 MG tablet [id number] Yes Take 250 mg by mouth once. [provider]  Active   amLODipine  (NORVASC ) 10 MG tablet [id number] Yes Take 1 tablet (10 mg total) by mouth daily. Lorren Greig PARAS, NP  Active Self, Pharmacy Records  aspirin  EC 81 MG tablet [id number] Yes Take 81 mg by mouth daily. Swallow whole. [provider]  Active Self, Pharmacy Records  atorvastatin  (LIPITOR) 40 MG tablet [id number]  Take 40 mg by mouth daily.  Patient not taking: Reported on 12/21/2023   [provider]  Active Self, Pharmacy Records  blood glucose meter kit and supplies [id number] Yes Dispense based on patient and  insurance preference. Use up to four times daily as directed. (FOR ICD-10 E10.9, E11.9). Lorren Greig PARAS, NP  Active Self, Pharmacy Records  chlorthalidone  (HYGROTON ) 25 MG tablet [id number] Yes TAKE 1 TABLET EVERY DAY Thukkani, Arun K, MD  Active Self, Pharmacy Records  Cholecalciferol (VITAMIN D-3) 25 MCG (1000 UT) CAPS [id number] Yes Take 1 capsule by mouth every morning. [provider]  Active Self, Pharmacy Records  empagliflozin  (JARDIANCE ) 10 MG TABS tablet [id number] Yes Take 1 tablet (10 mg total) by mouth daily before breakfast. Thukkani, Arun K, MD  Active Self, Pharmacy Records  ezetimibe  (ZETIA ) 10 MG tablet [id number] Yes Take 1 tablet (10 mg total) by mouth daily. Thukkani, Arun K, MD  Active Self, Pharmacy Records  Insulin  NPH, Human,, Isophane, (HUMULIN  N [facility]) 100 UNIT/ML Alphonsus [id number] Yes Inject 33 Units into the skin in the morning and at bedtime. Newlin, Enobong, MD  Active Self, Pharmacy Records  Insulin  Pen Needle (DROPLET PEN NEEDLES) 31G X 8 MM MISC [id number] Yes 1 each by Other route 2 (two) times daily with a meal. Lorren Greig PARAS, NP  Active Self, Pharmacy Records  levothyroxine  (SYNTHROID ) 50 MCG tablet [id number] Yes Take 1 tablet (50 mcg total) by mouth daily. Lorren Greig PARAS, NP  Active Self, Pharmacy Records  lisinopril  (ZESTRIL ) 40 MG tablet [id number] Yes TAKE 1 TABLET EVERY DAY Thukkani, Arun K, MD  Active Self, Pharmacy Records  metFORMIN  (GLUCOPHAGE ) 1000 MG tablet [id number] Yes Take 1 tablet (1,000 mg total) by mouth 2 (two) times daily with a meal. Lorren Greig PARAS, NP  Active Self, Pharmacy Records  nitroGLYCERIN  (NITROSTAT ) 0.4 MG SL tablet [id number] Yes Place 1 tablet (0.4 mg total) under the tongue every 5 (five) minutes  as needed for chest pain. Thukkani, Arun K, MD  Active Self, Pharmacy Records  spironolactone  (ALDACTONE ) 25 MG tablet [id number] Yes Take 0.5 tablets (12.5 mg total) by mouth daily. Elpidio Reyes DEL, MD  Active   torsemide  (DEMADEX )  20 MG tablet [id number] Yes Take 1 tablet (20 mg total) by mouth daily. Elpidio Reyes DEL, MD  Active   vitamin B-12 (CYANOCOBALAMIN ) 500 MCG tablet [id number] Yes Take 500 mcg by mouth daily. [provider]  Active Self, Pharmacy Records            Home Care and Equipment/Supplies: Were Home Health Services Ordered?: Yes Name of Home Health Agency:: [facility] Has Agency set up a time to come to your home?: No EMR reviewed for Home Health Orders: Orders present/patient has not received call (refer to CM for follow-up) (RNCM contacted [facility] for [facility]. First visit is planned for today 12/21/23) Any new equipment or medical supplies ordered?: No  Functional Questionnaire: Do you need assistance with bathing/showering or dressing?: Yes (assistance from Spouse) Do you need assistance with meal preparation?: Yes (Stepson prepares meals) Do you need assistance with eating?: No Do you have difficulty maintaining continence: No Do you need assistance with getting out of bed/getting out of a chair/moving?: Yes (needs assistance with getting off of the chair) Do you have difficulty managing or taking your medications?: Yes (Stepson/Troy assist with medication management)  Follow up appointments reviewed: PCP Follow-up appointment confirmed?: No (Collaboration with Care Guide for PCP hospital follow up. Scheduled for 01/01/24 at 2pm) MD Provider Line [phone] Given: No Specialist Hospital Follow-up appointment confirmed?: Yes Date of Specialist follow-up appointment?: 12/29/23 Follow-Up Specialty Provider:: HF Clinic Do you need transportation to your follow-up appointment?: No Do you understand care options if your condition(s) worsen?: Yes-patient verbalized understanding  SDOH Interventions Today    Flowsheet Row Most Recent Value  SDOH Interventions   Food Insecurity Interventions Intervention Not Indicated  Housing Interventions Intervention Not Indicated   Transportation Interventions Intervention Not Indicated  Utilities Interventions Intervention Not Indicated    Goals Addressed             This Visit's Progress    VBCI Transitions of Care (TOC) Care Plan       Problems:  Recent Hospitalization for treatment of CHF Equipment/DME barrier patient requesting a walker and Knowledge Deficit Related to HF  Goal:  Over the next 30 days, the patient will not experience hospital readmission  Interventions:  Transitions of Care: Durable Medical Equipment (DME) needs assessed with patient/caregiver Doctor Visits  - discussed the importance of doctor visits Arranged PCP follow-up within 12-14 days (Care Guide Scheduled) Contacted Health RN/OT/PT - Spoke with [facility] regarding SOC, first visit is planned for today, provided with contact information for Troy/Stepson  Heart Failure Interventions: Basic overview and discussion of pathophysiology of Heart Failure reviewed Provided education on low sodium diet Advised patient to weigh each morning after emptying bladder Discussed importance of daily weight and advised patient to weigh and record daily Discussed the importance of keeping all appointments with provider Screening for signs and symptoms of depression related to chronic disease state  Assessed social determinant of health barriers   Patient Self Care Activities:  Attend all scheduled provider appointments Call pharmacy for medication refills 3-7 days in advance of running out of medications Participate in Transition of Care Program/Attend TOC scheduled calls Take medications as prescribed   call office if I gain more than 2 pounds in one day or 5 pounds  in one week use salt in moderation weigh myself daily  Plan:  Telephone follow up appointment with care management team member scheduled for:  12/25/23 at 1pm        Andrea Dimes RN, BSN   [facility] RN Care  Manager [phone]

## 2023-12-21 NOTE — Telephone Encounter (Unsigned)
 Copied from CRM [id number]. Topic: Clinical - Home Health Verbal Orders >> Dec 21, 2023 12:09 PM Tobias CROME wrote: Caller/Agency: [facility] Callback Number: [phone] - Confidential VM Service Requested: Physical Therapy Frequency: 2x4 1x5 Any new concerns about the patient? No

## 2023-12-23 NOTE — Telephone Encounter (Signed)
Keep all scheduled appointments 

## 2023-12-23 NOTE — Telephone Encounter (Signed)
 Patient  has appointment on 8/8 and requesting a walker

## 2023-12-24 ENCOUNTER — Telehealth: Payer: Self-pay | Admitting: Family

## 2023-12-24 NOTE — Telephone Encounter (Signed)
 Pt called [facility] frustrated because he stated he has been calling for 3 days regarding a walker he is requesting. On the cb, I let pt know Amy Lorren will see him for his HFU scheduled appt and will need documentation for walker. I scheduled pt to come in sooner for his HFU tomorrow morning. Pt stated he desperately needs the walker.

## 2023-12-24 NOTE — Telephone Encounter (Signed)
 noted

## 2023-12-24 NOTE — Telephone Encounter (Signed)
 In talked to patient son and he is upset stated he has been calling 4 to 5 days about walker however he has brought a walker

## 2023-12-25 ENCOUNTER — Ambulatory Visit (INDEPENDENT_AMBULATORY_CARE_PROVIDER_SITE_OTHER): Admitting: Family

## 2023-12-25 ENCOUNTER — Other Ambulatory Visit: Payer: Self-pay | Admitting: Family

## 2023-12-25 ENCOUNTER — Encounter: Payer: Self-pay | Admitting: Family

## 2023-12-25 ENCOUNTER — Other Ambulatory Visit: Admitting: *Deleted

## 2023-12-25 VITALS — BP 74/38 | HR 69 | Temp 96.8°F | Resp 16 | Ht 73.0 in | Wt 167.0 lb

## 2023-12-25 DIAGNOSIS — I5030 Unspecified diastolic (congestive) heart failure: Secondary | ICD-10-CM

## 2023-12-25 DIAGNOSIS — R1310 Dysphagia, unspecified: Secondary | ICD-10-CM

## 2023-12-25 DIAGNOSIS — Z77090 Contact with and (suspected) exposure to asbestos: Secondary | ICD-10-CM

## 2023-12-25 DIAGNOSIS — R2689 Other abnormalities of gait and mobility: Secondary | ICD-10-CM

## 2023-12-25 DIAGNOSIS — Z09 Encounter for follow-up examination after completed treatment for conditions other than malignant neoplasm: Secondary | ICD-10-CM

## 2023-12-25 DIAGNOSIS — I272 Pulmonary hypertension, unspecified: Secondary | ICD-10-CM | POA: Diagnosis not present

## 2023-12-25 DIAGNOSIS — E1165 Type 2 diabetes mellitus with hyperglycemia: Secondary | ICD-10-CM

## 2023-12-25 DIAGNOSIS — R7989 Other specified abnormal findings of blood chemistry: Secondary | ICD-10-CM | POA: Diagnosis not present

## 2023-12-25 DIAGNOSIS — I110 Hypertensive heart disease with heart failure: Secondary | ICD-10-CM | POA: Diagnosis not present

## 2023-12-25 DIAGNOSIS — R6510 Systemic inflammatory response syndrome (SIRS) of non-infectious origin without acute organ dysfunction: Secondary | ICD-10-CM

## 2023-12-25 DIAGNOSIS — N189 Chronic kidney disease, unspecified: Secondary | ICD-10-CM | POA: Diagnosis not present

## 2023-12-25 DIAGNOSIS — I2510 Atherosclerotic heart disease of native coronary artery without angina pectoris: Secondary | ICD-10-CM

## 2023-12-25 DIAGNOSIS — Z950 Presence of cardiac pacemaker: Secondary | ICD-10-CM | POA: Diagnosis not present

## 2023-12-25 DIAGNOSIS — J9610 Chronic respiratory failure, unspecified whether with hypoxia or hypercapnia: Secondary | ICD-10-CM

## 2023-12-25 DIAGNOSIS — I25119 Atherosclerotic heart disease of native coronary artery with unspecified angina pectoris: Secondary | ICD-10-CM | POA: Diagnosis not present

## 2023-12-25 DIAGNOSIS — E785 Hyperlipidemia, unspecified: Secondary | ICD-10-CM

## 2023-12-25 DIAGNOSIS — E039 Hypothyroidism, unspecified: Secondary | ICD-10-CM

## 2023-12-25 DIAGNOSIS — E43 Unspecified severe protein-calorie malnutrition: Secondary | ICD-10-CM

## 2023-12-25 DIAGNOSIS — G929 Unspecified toxic encephalopathy: Secondary | ICD-10-CM

## 2023-12-25 DIAGNOSIS — R410 Disorientation, unspecified: Secondary | ICD-10-CM

## 2023-12-25 DIAGNOSIS — I152 Hypertension secondary to endocrine disorders: Secondary | ICD-10-CM

## 2023-12-25 DIAGNOSIS — N179 Acute kidney failure, unspecified: Secondary | ICD-10-CM

## 2023-12-25 DIAGNOSIS — E119 Type 2 diabetes mellitus without complications: Secondary | ICD-10-CM

## 2023-12-25 DIAGNOSIS — Z794 Long term (current) use of insulin: Secondary | ICD-10-CM | POA: Diagnosis not present

## 2023-12-25 DIAGNOSIS — G4733 Obstructive sleep apnea (adult) (pediatric): Secondary | ICD-10-CM

## 2023-12-25 MED ORDER — METFORMIN HCL 1000 MG PO TABS: 1000.0000 mg | ORAL_TABLET | Freq: Two times a day (BID) | ORAL | 0 refills | Status: DC

## 2023-12-25 MED ORDER — LEVOTHYROXINE SODIUM 50 MCG PO TABS: 50.0000 ug | ORAL_TABLET | Freq: Every day | ORAL | 0 refills | Status: AC

## 2023-12-25 NOTE — Telephone Encounter (Signed)
 Complete

## 2023-12-25 NOTE — Patient Instructions (Signed)
 Visit Information  Thank you for taking time to visit with me today. Please don't hesitate to contact me if I can be of assistance to you before our next scheduled telephone appointment.   Following is a copy of your care plan:   Goals Addressed             This Visit's Progress    VBCI Transitions of Care (TOC) Care Plan       Problems:  Recent Hospitalization for treatment of CHF Equipment/DME barrier patient requesting a walker and Knowledge Deficit Related to HF  Goal:  Over the next 30 days, the patient will not experience hospital readmission  Interventions:  Transitions of Care: Doctor Visits  - discussed the importance of doctor visits Discussed patient having PT twice weekly for 4 weeks then 1 x week for 5 Contacted [facility] for update on referral, unsuccessful attempts made to contact patient, provided [facility] with updated contact information for DPR, [place] Provided therapeutic listening  Heart Failure Interventions: Basic overview and discussion of pathophysiology of Heart Failure reviewed Provided education on low sodium diet Advised patient to weigh each morning after emptying bladder Discussed importance of daily weight and advised patient to weigh and record daily Discussed the importance of keeping all appointments with provider Reviewed upcoming HF Clinic appointment on 12/29/23  Patient Self Care Activities:  Attend all scheduled provider appointments Call pharmacy for medication refills 3-7 days in advance of running out of medications Participate in Transition of Care Program/Attend TOC scheduled calls Take medications as prescribed   call office if I gain more than 2 pounds in one day or 5 pounds in one week use salt in moderation weigh myself daily  Plan:  Telephone follow up appointment with care management team member scheduled for:  01/01/24 at 2:30pm        The patient verbalized understanding of instructions, educational materials, and  care plan provided today and agreed to receive a mailed copy of patient instructions, educational materials, and care plan.   Telephone follow up appointment with care management team member scheduled for:01/01/24 at 2:30pm  Please call the care guide team at [phone] if you need to cancel or reschedule your appointment.   Please call 1-800-273-TALK (toll free, 24 hour hotline) go to [facility] Urgent [facility] [address], [place] [phone]) call 911 if you are experiencing a Mental Health or Behavioral Health Crisis or need someone to talk to.  Andrea Dimes RN, BSN [place]  [facility] RN Care Manager [phone]

## 2023-12-25 NOTE — Progress Notes (Signed)
 Needs medication refill, patient needs a walker with the set on it because he gets out of breathe easily.

## 2023-12-25 NOTE — Progress Notes (Signed)
 Patient ID: Robert Ramirez, male    DOB: Feb 15, 1946  MRN: [id number]  CC: Hospital Discharge Follow-Up  Subjective: Robert Ramirez is a 78 y.o. male who presents for hospital discharge follow-up. He is accompanied by his son.   His concerns today include:  Patient seen on 12/07/2023 - 12/19/2023 (12 days) at [facility] for heart failure with preserved ejection fraction, hypertension associated with diabetes, uncontrolled type 2 diabetes with hyperglycemia with long-term current use of insulin  and additional diagnoses. Reports since hospital discharge patient feeling improved. Denies red flag symptoms. Patient doing well on medication regimen, no issues/concerns. Son states patient needs medication refills on some medications but he isn't sure which ones and he plans to notify Primary Care when he gets back home to check and see. Son states patient needs a walker with seat due to patient becomes out of breath when walking. Patient has upcoming appointments with Cardiology (12/29/2023) and Pulmonology (01/19/2024). Son states patient is scheduled to receive a phone call from [facility] on today at 1:00 pm. Son states Physical Therapy has been to their home several times. No further issues/concerns for discussion today.   Patient Active Problem List   Diagnosis Date Noted   Protein-calorie malnutrition, severe 12/14/2023   (HFpEF) heart failure with preserved ejection fraction (HCC) 12/07/2023   Cellulitis 12/07/2023   Elevated troponin 12/07/2023   SIRS (systemic inflammatory response syndrome) (HCC) 12/07/2023   Uncontrolled type 2 diabetes mellitus with hyperglycemia, with long-term current use of insulin  (HCC) 12/07/2023   Acute kidney injury superimposed on chronic kidney disease (HCC) 12/07/2023   History of permanent cardiac pacemaker placement 12/07/2023   OSA treated with BiPAP 12/07/2023   Acute on chronic right-sided heart failure (HCC) 12/07/2023   AV  block 06/12/2023   Complete heart block (HCC) 05/28/2023   Poorly-controlled hypertension 04/05/2019   Conductive hearing loss 04/05/2019   Chewing tobacco use 04/05/2019   Bilateral impacted cerumen 04/05/2019   Primary hyperparathyroidism (HCC) 11/21/2018   Chronic respiratory failure with hypoxia and hypercapnia (HCC) 11/18/2018   Chronic obstructive asthma (with obstructive pulmonary disease) (HCC) 06/09/2018   Other secondary pulmonary hypertension (HCC) 06/09/2018   Healthcare maintenance 03/30/2018   Serum total bilirubin elevated 03/26/2018   Asbestos exposure 03/26/2018   Lesion of skin of scalp 03/26/2018   Coronary artery disease involving native coronary artery of native heart without angina pectoris 03/26/2018   Acquired hypothyroidism 03/26/2018   Pleural plaque 03/14/2018   Diabetes (HCC) 03/14/2018   Somnolence    Hyponatremia    Abnormal chest x-ray    Hypertension associated with diabetes (HCC) 07/18/2014   Mixed hyperlipidemia 07/18/2014   Diabetic retinopathy associated with type 2 diabetes mellitus (HCC) 07/18/2014     Current Outpatient Medications on File Prior to Visit  Medication Sig Dispense Refill   acetaZOLAMIDE  (DIAMOX ) 250 MG tablet Take 250 mg by mouth once.     amLODipine  (NORVASC ) 10 MG tablet Take 1 tablet (10 mg total) by mouth daily. 30 tablet 2   aspirin  EC 81 MG tablet Take 81 mg by mouth daily. Swallow whole.     blood glucose meter kit and supplies Dispense based on patient and insurance preference. Use up to four times daily as directed. (FOR ICD-10 E10.9, E11.9). 1 each 0   chlorthalidone  (HYGROTON ) 25 MG tablet TAKE 1 TABLET EVERY DAY 90 tablet 3   Cholecalciferol (VITAMIN D-3) 25 MCG (1000 UT) CAPS Take 1 capsule by mouth every morning.  empagliflozin  (JARDIANCE ) 10 MG TABS tablet Take 1 tablet (10 mg total) by mouth daily before breakfast. 90 tablet 3   ezetimibe  (ZETIA ) 10 MG tablet Take 1 tablet (10 mg total) by mouth daily. 90  tablet 3   Insulin  NPH, Human,, Isophane, (HUMULIN  N KWIKPEN) 100 UNIT/ML Kiwkpen Inject 33 Units into the skin in the morning and at bedtime. 60 mL 1   Insulin  Pen Needle (DROPLET PEN NEEDLES) 31G X 8 MM MISC 1 each by Other route 2 (two) times daily with a meal. 200 each 0   lisinopril  (ZESTRIL ) 40 MG tablet TAKE 1 TABLET EVERY DAY 90 tablet 3   nitroGLYCERIN  (NITROSTAT ) 0.4 MG SL tablet Place 1 tablet (0.4 mg total) under the tongue every 5 (five) minutes as needed for chest pain. 25 tablet 3   spironolactone  (ALDACTONE ) 25 MG tablet Take 0.5 tablets (12.5 mg total) by mouth daily. 30 tablet 0   torsemide  (DEMADEX ) 20 MG tablet Take 1 tablet (20 mg total) by mouth daily. 30 tablet 0   vitamin B-12 (CYANOCOBALAMIN ) 500 MCG tablet Take 500 mcg by mouth daily.     atorvastatin  (LIPITOR) 40 MG tablet Take 40 mg by mouth daily. (Patient not taking: Reported on 12/21/2023)     No current facility-administered medications on file prior to visit.    No Known Allergies  Social History   Socioeconomic History   Marital status: Married    Spouse name: Not on file   Number of children: Not on file   Years of education: Not on file   Highest education level: Not on file  Occupational History   Not on file  Tobacco Use   Smoking status: Former    Current packs/day: 0.00    Types: Cigarettes    Quit date: 03/30/1980    Years since quitting: 43.7    Passive exposure: Never   Smokeless tobacco: Current    Types: Chew, Snuff  Vaping Use   Vaping status: Never Used  Substance and Sexual Activity   Alcohol use: No   Drug use: No   Sexual activity: Not Currently  Other Topics Concern   Not on file  Social History Narrative   Not on file   Social Drivers of Health   Financial Resource Strain: Low Risk  (08/06/2023)   Overall Financial Resource Strain (CARDIA)    Difficulty of Paying Living Expenses: Not hard at all  Food Insecurity: No Food Insecurity (12/21/2023)   Hunger Vital Sign     Worried About Running Out of Food in the Last Year: Never true    Ran Out of Food in the Last Year: Never true  Transportation Needs: No Transportation Needs (12/21/2023)   PRAPARE - Administrator, Civil Service (Medical): No    Lack of Transportation (Non-Medical): No  Physical Activity: Sufficiently Active (08/06/2023)   Exercise Vital Sign    Days of Exercise per Week: 7 days    Minutes of Exercise per Session: 30 min  Stress: No Stress Concern Present (08/06/2023)   [employer] of Occupational Health - Occupational Stress Questionnaire    Feeling of Stress : Not at all  Social Connections: Moderately Isolated (12/08/2023)   Social Connection and Isolation Panel    Frequency of Communication with Friends and Family: More than three times a week    Frequency of Social Gatherings with Friends and Family: More than three times a week    Attends Religious Services: Never    Active Member  of Clubs or Organizations: No    Attends Banker Meetings: Never    Marital Status: Married  Catering manager Violence: Not At Risk (12/21/2023)   Humiliation, Afraid, Rape, and Kick questionnaire    Fear of Current or Ex-Partner: No    Emotionally Abused: No    Physically Abused: No    Sexually Abused: No    Family History  Problem Relation Age of Onset   Heart failure Mother    Cancer Father    Diabetes Sister    Cancer Sister     Past Surgical History:  Procedure Laterality Date   APPENDECTOMY     ARTERIAL LINE INSERTION N/A 12/09/2023   Procedure: ARTERIAL LINE INSERTION;  Surgeon: Cherrie Toribio SAUNDERS, MD;  Location: [facility] INVASIVE CV LAB;  Service: Cardiovascular;  Laterality: N/A;   CENTRAL LINE INSERTION  12/09/2023   Procedure: CENTRAL LINE INSERTION;  Surgeon: Cherrie Toribio SAUNDERS, MD;  Location: [facility] INVASIVE CV LAB;  Service: Cardiovascular;;   PACEMAKER IMPLANT N/A 06/12/2023   Procedure: PACEMAKER IMPLANT;  Surgeon: Kennyth Chew, MD;  Location: [facility] INVASIVE  CV LAB;  Service: Cardiovascular;  Laterality: N/A;   RIGHT HEART CATH N/A 12/09/2023   Procedure: RIGHT HEART CATH;  Surgeon: Cherrie Toribio SAUNDERS, MD;  Location: [facility] INVASIVE CV LAB;  Service: Cardiovascular;  Laterality: N/A;   RIGHT/LEFT HEART CATH AND CORONARY ANGIOGRAPHY N/A 03/17/2018   Procedure: RIGHT/LEFT HEART CATH AND CORONARY ANGIOGRAPHY;  Surgeon: Claudene Pacific, MD;  Location: [facility] INVASIVE CV LAB;  Service: Cardiovascular;  Laterality: N/A;    ROS: Review of Systems Negative except as stated above  PHYSICAL EXAM: BP (!) 74/38   Pulse 69   Temp (!) 96.8 F (36 C) (Temporal)   Resp 16   Ht 6' 1 (1.854 m)   Wt 167 lb (75.8 kg)   SpO2 97%   BMI 22.03 kg/m      12/25/2023    9:09 AM 12/25/2023    8:37 AM 12/19/2023    1:25 PM  Vitals with BMI  Height  6' 1   Weight  167 lbs   BMI  22.04   Systolic 74 80 117  Diastolic 38 43 61  Pulse  69 88    Physical Exam HENT:     Head: Normocephalic and atraumatic.     Nose: Nose normal.     Mouth/Throat:     Mouth: Mucous membranes are moist.     Pharynx: Oropharynx is clear.  Eyes:     Extraocular Movements: Extraocular movements intact.     Conjunctiva/sclera: Conjunctivae normal.     Pupils: Pupils are equal, round, and reactive to light.  Cardiovascular:     Rate and Rhythm: Normal rate and regular rhythm.     Pulses: Normal pulses.     Heart sounds: Normal heart sounds.  Pulmonary:     Effort: Pulmonary effort is normal.     Breath sounds: Normal breath sounds.  Musculoskeletal:        General: Normal range of motion.     Cervical back: Normal range of motion and neck supple.  Neurological:     General: No focal deficit present.     Mental Status: He is alert and oriented to person, place, and time.  Psychiatric:        Mood and Affect: Mood normal.        Behavior: Behavior normal.     ASSESSMENT AND PLAN: Note - Plan of care discussed with supervising physician  Raguel Blush, MD.   1. Hospital  discharge follow-up (Primary) - Reviewed hospital course, current medications, ensured proper follow-up in place, and addressed concerns.  - Routine screening.  - Basic Metabolic Panel  2. Heart failure with preserved ejection fraction, unspecified HF chronicity (HCC) 3. Coronary artery disease, unspecified vessel or lesion type, unspecified whether angina present, unspecified whether native or transplanted heart 4. Hypertension associated with diabetes (HCC) 5. History of permanent cardiac pacemaker placement 6. Elevated troponin 7. Hyperlipidemia, unspecified hyperlipidemia type - Patient today in office with no cardiopulmonary/acute distress.  - Patient's blood pressure low today in office. Patient is asymptomatic. I discussed with patient/patient's son in detail to report to Emergency Department/call 911 for immediate medical evaluation should patient become symptomatic. Patient/patient's son verbalized agreement/understanding.  - Continue present management. Counseled on medication adherence/adverse effects.  - Keep upcoming appointment with Cardiology.   8. Pulmonary hypertension (HCC) 9. Chronic respiratory failure, unspecified whether with hypoxia or hypercapnia (HCC) 10. OSA treated with BiPAP 11. History of asbestos exposure - Patient today in office with no cardiopulmonary/acute distress.  - Continue present management.  - Keep upcoming appointment with Pulmonology.   12. SIRS (systemic inflammatory response syndrome) (HCC) 13. Toxic encephalopathy, unspecified toxin 14. Delirium - Improved prior to hospital discharge. - Continue present management.   15. Dysphagia, unspecified type - Referral to ENT for evaluation/management. - Ambulatory referral to ENT  16. Acute kidney injury superimposed on chronic kidney disease (HCC) - Routine screening (see #1). - Referral to Nephrology for evaluation/management. - Ambulatory referral to Nephrology  17. Uncontrolled type 2  diabetes mellitus with hyperglycemia, with long-term current use of insulin  (HCC) - Continue Metformin  as prescribed.  - Continue remaining regimen as prescribed. - Hemoglobin A1c result pending.  - Routine screening.  - Discussed the importance of healthy eating habits, low-carbohydrate diet, low-sugar diet, regular aerobic exercise (at least 150 minutes a week as tolerated) and medication compliance to achieve or maintain control of diabetes. Counseled on medication adherence/adverse effects. - Referral to Endocrinology for evaluation/management.  - Follow-up with primary provider as scheduled. - Microalbumin / creatinine urine ratio - Hemoglobin A1c - metFORMIN  (GLUCOPHAGE ) 1000 MG tablet; Take 1 tablet (1,000 mg total) by mouth 2 (two) times daily with a meal.  Dispense: 180 tablet; Refill: 0 - Ambulatory referral to Endocrinology  18. Diabetic eye exam [facility]) - Referral to Ophthalmology for evaluation/management. - Ambulatory referral to Ophthalmology  19. Acquired hypothyroidism - Continue Levothyroxine  as prescribed. Counseled on medication adherence/adverse effects.  - Follow-up with primary provider as scheduled. - levothyroxine  (SYNTHROID ) 50 MCG tablet; Take 1 tablet (50 mcg total) by mouth daily.  Dispense: 90 tablet; Refill: 0  20. Protein-calorie malnutrition, severe - Referral to Medical Nutrition Therapy-MNT for evaluation/management. - Amb ref to Medical Nutrition Therapy-MNT  21. Decreased mobility - DME 4 wheeled rolling walker with seat prescribed.  - For home use only DME 4 wheeled rolling walker with seat ([id number])   Patient was given the opportunity to ask questions.  Patient verbalized understanding of the plan and was able to repeat key elements of the plan. Patient was given clear instructions to go to Emergency Department or return to medical center if symptoms don't improve, worsen, or new problems develop.The patient verbalized understanding.   Orders  Placed This Encounter  Procedures   For home use only DME 4 wheeled rolling walker with seat ([id number])   Microalbumin / creatinine urine ratio   Basic Metabolic Panel   Hemoglobin A1c  Ambulatory referral to ENT   Ambulatory referral to Ophthalmology   Ambulatory referral to Nephrology   Amb ref to Medical Nutrition Therapy-MNT   Ambulatory referral to Endocrinology     Requested Prescriptions   Signed Prescriptions Disp Refills   metFORMIN  (GLUCOPHAGE ) 1000 MG tablet 180 tablet 0    Sig: Take 1 tablet (1,000 mg total) by mouth 2 (two) times daily with a meal.   levothyroxine  (SYNTHROID ) 50 MCG tablet 90 tablet 0    Sig: Take 1 tablet (50 mcg total) by mouth daily.    Follow-up with primary provider as scheduled.   Greig JINNY Drones, NP

## 2023-12-25 NOTE — Transitions of Care (Post Inpatient/ED Visit) (Signed)
 Transition of Care week 1, coordination of care  Visit Note  12/25/2023  Name: Robert Ramirez MRN: [id number]          DOB: 1945-07-05  Situation: Patient enrolled in [facility] 30-day program. Visit completed with DPR/[place] by telephone.   Background:   Initial Transition Care Management Follow-up Telephone Call    Past Medical History:  Diagnosis Date   Diabetes mellitus    High cholesterol    Hypertension     Assessment: Patient Reported Symptoms: Cognitive Cognitive Status: No symptoms reported (Per DPR)      Neurological Neurological Review of Symptoms: Not assessed (Spoke with DPR)    HEENT HEENT Symptoms Reported: Not assessed (Spoke with DPR)      Cardiovascular Cardiovascular Symptoms Reported: No symptoms reported (Per DPR) Cardiovascular Comment: Discussed low BP taken today during PCP visit. Advised to increase hydration and take all medications to upcoming HF Clinic appointment  Respiratory Respiratory Symptoms Reported: Not assesed    Endocrine Endocrine Symptoms Reported: Not assessed    Gastrointestinal Gastrointestinal Symptoms Reported: Not assessed      Genitourinary Genitourinary Symptoms Reported: Not assessed    Integumentary Integumentary Symptoms Reported: Not assessed    Musculoskeletal Musculoskelatal Symptoms Reviewed: Limited mobility Musculoskeletal Management Strategies: Exercise, Coping strategies, Routine screening Musculoskeletal Self-Management Outcome: 3 (uncertain) Musculoskeletal Comment: HH PT twice weekly. Patient has been doing provided exercises at home. Using walker for ambulation      Psychosocial Psychosocial Symptoms Reported: Not assessed         There were no vitals filed for this visit.  Unable to perform complete medication review today with DPR. DPR did not have all medications and list with him at the time of call. Medications Reviewed Today     Reviewed by Lucky Andrea LABOR, RN (Registered Nurse) on 12/25/23 at 1401   Med List Status: <None>   Medication Order Taking? Sig Documenting Provider Last Dose Status Informant  acetaZOLAMIDE  (DIAMOX ) 250 MG tablet [id number]  Take 250 mg by mouth once. [provider]  Active   amLODipine  (NORVASC ) 10 MG tablet [id number]  Take 1 tablet (10 mg total) by mouth daily. Lorren Greig PARAS, NP  Active Self, Pharmacy Records  aspirin  EC 81 MG tablet [id number]  Take 81 mg by mouth daily. Swallow whole. [provider]  Active Self, Pharmacy Records  atorvastatin  (LIPITOR) 40 MG tablet [id number]  Take 40 mg by mouth daily.  Patient not taking: Reported on 12/21/2023   [provider]  Active Self, Pharmacy Records  blood glucose meter kit and supplies [id number]  Dispense based on patient and insurance preference. Use up to four times daily as directed. (FOR ICD-10 E10.9, E11.9). Lorren Greig PARAS, NP  Active Self, Pharmacy Records  chlorthalidone  (HYGROTON ) 25 MG tablet [id number]  TAKE 1 TABLET EVERY DAY Thukkani, Arun K, MD  Active Self, Pharmacy Records  Cholecalciferol (VITAMIN D-3) 25 MCG (1000 UT) CAPS [id number]  Take 1 capsule by mouth every morning. [provider]  Active Self, Pharmacy Records  empagliflozin  (JARDIANCE ) 10 MG TABS tablet [id number]  Take 1 tablet (10 mg total) by mouth daily before breakfast. Thukkani, Arun K, MD  Active Self, Pharmacy Records  ezetimibe  (ZETIA ) 10 MG tablet [id number]  Take 1 tablet (10 mg total) by mouth daily. Thukkani, Arun K, MD  Active Self, Pharmacy Records  Insulin  NPH, Human,, Isophane, (HUMULIN  N [facility]) 100 UNIT/ML Alph[employer]us [id number]  Inject 33 Units into the skin in the morning and at bedtime. Newlin, Enobong,  MD  Active Self, Pharmacy Records  Insulin  Pen Needle (DROPLET PEN NEEDLES) 31G X 8 MM MISC [id number]  1 each by Other route 2 (two) times daily with a meal. Lorren Greig PARAS, NP  Active Self, Pharmacy Records  levothyroxine  (SYNTHROID ) 50 MCG tablet [id number] Yes Take 1 tablet (50 mcg total)  by mouth daily. Lorren Greig PARAS, NP  Active   lisinopril  (ZESTRIL ) 40 MG tablet [id number]  TAKE 1 TABLET EVERY DAY Thukkani, Arun K, MD  Active Self, Pharmacy Records  metFORMIN  (GLUCOPHAGE ) 1000 MG tablet [id number]  Take 1 tablet (1,000 mg total) by mouth 2 (two) times daily with a meal. Lorren Greig PARAS, NP  Active   nitroGLYCERIN  (NITROSTAT ) 0.4 MG SL tablet [id number]  Place 1 tablet (0.4 mg total) under the tongue every 5 (five) minutes as needed for chest pain. Thukkani, Arun K, MD  Active Self, Pharmacy Records  spironolactone  (ALDACTONE ) 25 MG tablet [id number] Yes Take 0.5 tablets (12.5 mg total) by mouth daily. Elpidio Reyes DEL, MD  Active   torsemide  (DEMADEX ) 20 MG tablet [id number] Yes Take 1 tablet (20 mg total) by mouth daily. Elpidio Reyes DEL, MD  Active   vitamin B-12 (CYANOCOBALAMIN ) 500 MCG tablet [id number]  Take 500 mcg by mouth daily. [provider]  Active Self, Pharmacy Records            Recommendation:   Continue Current Plan of Care  Follow Up Plan:   Telephone follow-up in 1 week  Andrea Dimes RN, BSN [place]  [facility] RN Care Manager [phone]

## 2023-12-25 NOTE — Telephone Encounter (Signed)
 Patient came in today and we are putting in for a walker

## 2023-12-26 LAB — BASIC METABOLIC PANEL WITH GFR
BUN/Creatinine Ratio: 53 — ABNORMAL HIGH (ref 10–24)
BUN: 100 mg/dL (ref 8–27)
CO2: 31 mmol/L — ABNORMAL HIGH (ref 20–29)
Calcium: 10 mg/dL (ref 8.6–10.2)
Chloride: 90 mmol/L — ABNORMAL LOW (ref 96–106)
Creatinine, Ser: 1.88 mg/dL — ABNORMAL HIGH (ref 0.76–1.27)
Glucose: 57 mg/dL — ABNORMAL LOW (ref 70–99)
Potassium: 4.6 mmol/L (ref 3.5–5.2)
Sodium: 137 mmol/L (ref 134–144)
eGFR: 37 mL/min/1.73 — ABNORMAL LOW (ref >59)

## 2023-12-26 LAB — HEMOGLOBIN A1C
Est. average glucose Bld gHb Est-mCnc: 209 mg/dL
Hgb A1c MFr Bld: 8.9 % — ABNORMAL HIGH (ref 4.8–5.6)

## 2023-12-28 ENCOUNTER — Telehealth (HOSPITAL_COMMUNITY): Payer: Self-pay

## 2023-12-28 ENCOUNTER — Ambulatory Visit: Payer: Self-pay | Admitting: Family

## 2023-12-28 NOTE — Telephone Encounter (Signed)
 Called to confirm/remind patient of their appointment at [facility] on 12/29/23.   Appointment:   [x] Confirmed  [] Left mess   [] No answer/No voice mail  [] VM Full/unable to leave message  [] Phone not in service  Patient reminded to bring all medications and/or complete list.  Confirmed patient has transportation. Gave directions, instructed to utilize valet parking.

## 2023-12-29 ENCOUNTER — Ambulatory Visit (HOSPITAL_COMMUNITY): Payer: Self-pay | Admitting: Cardiology

## 2023-12-29 ENCOUNTER — Ambulatory Visit (HOSPITAL_COMMUNITY)
Admit: 2023-12-29 | Discharge: 2023-12-29 | Disposition: A | Discharge status: Home / Self Care | Source: Ambulatory Visit | Attending: Cardiology | Admitting: Cardiology

## 2023-12-29 ENCOUNTER — Encounter (HOSPITAL_COMMUNITY): Payer: Self-pay

## 2023-12-29 VITALS — BP 98/58 | HR 77 | Ht 72.0 in | Wt 167.4 lb

## 2023-12-29 DIAGNOSIS — L89159 Pressure ulcer of sacral region, unspecified stage: Secondary | ICD-10-CM | POA: Insufficient documentation

## 2023-12-29 DIAGNOSIS — N179 Acute kidney failure, unspecified: Secondary | ICD-10-CM | POA: Diagnosis not present

## 2023-12-29 DIAGNOSIS — Z87891 Personal history of nicotine dependence: Secondary | ICD-10-CM | POA: Diagnosis not present

## 2023-12-29 DIAGNOSIS — G4733 Obstructive sleep apnea (adult) (pediatric): Secondary | ICD-10-CM | POA: Insufficient documentation

## 2023-12-29 DIAGNOSIS — N1831 Chronic kidney disease, stage 3a: Secondary | ICD-10-CM | POA: Insufficient documentation

## 2023-12-29 DIAGNOSIS — Z794 Long term (current) use of insulin: Secondary | ICD-10-CM | POA: Insufficient documentation

## 2023-12-29 DIAGNOSIS — E785 Hyperlipidemia, unspecified: Secondary | ICD-10-CM | POA: Insufficient documentation

## 2023-12-29 DIAGNOSIS — I5032 Chronic diastolic (congestive) heart failure: Secondary | ICD-10-CM | POA: Diagnosis not present

## 2023-12-29 DIAGNOSIS — J9610 Chronic respiratory failure, unspecified whether with hypoxia or hypercapnia: Secondary | ICD-10-CM | POA: Diagnosis not present

## 2023-12-29 DIAGNOSIS — I272 Pulmonary hypertension, unspecified: Secondary | ICD-10-CM | POA: Diagnosis not present

## 2023-12-29 DIAGNOSIS — I442 Atrioventricular block, complete: Secondary | ICD-10-CM | POA: Insufficient documentation

## 2023-12-29 DIAGNOSIS — J920 Pleural plaque with presence of asbestos: Secondary | ICD-10-CM | POA: Diagnosis not present

## 2023-12-29 DIAGNOSIS — Z7984 Long term (current) use of oral hypoglycemic drugs: Secondary | ICD-10-CM | POA: Insufficient documentation

## 2023-12-29 DIAGNOSIS — L988 Other specified disorders of the skin and subcutaneous tissue: Secondary | ICD-10-CM | POA: Diagnosis not present

## 2023-12-29 DIAGNOSIS — R1310 Dysphagia, unspecified: Secondary | ICD-10-CM | POA: Diagnosis not present

## 2023-12-29 DIAGNOSIS — I2510 Atherosclerotic heart disease of native coronary artery without angina pectoris: Secondary | ICD-10-CM | POA: Insufficient documentation

## 2023-12-29 DIAGNOSIS — I4892 Unspecified atrial flutter: Secondary | ICD-10-CM | POA: Diagnosis not present

## 2023-12-29 DIAGNOSIS — I2781 Cor pulmonale (chronic): Secondary | ICD-10-CM | POA: Diagnosis not present

## 2023-12-29 DIAGNOSIS — Z950 Presence of cardiac pacemaker: Secondary | ICD-10-CM | POA: Diagnosis not present

## 2023-12-29 DIAGNOSIS — I5084 End stage heart failure: Secondary | ICD-10-CM | POA: Insufficient documentation

## 2023-12-29 DIAGNOSIS — I50810 Right heart failure, unspecified: Secondary | ICD-10-CM

## 2023-12-29 DIAGNOSIS — R627 Adult failure to thrive: Secondary | ICD-10-CM | POA: Insufficient documentation

## 2023-12-29 DIAGNOSIS — Z5731 Occupational exposure to environmental tobacco smoke: Secondary | ICD-10-CM | POA: Diagnosis not present

## 2023-12-29 DIAGNOSIS — I130 Hypertensive heart and chronic kidney disease with heart failure and stage 1 through stage 4 chronic kidney disease, or unspecified chronic kidney disease: Secondary | ICD-10-CM | POA: Insufficient documentation

## 2023-12-29 DIAGNOSIS — E1122 Type 2 diabetes mellitus with diabetic chronic kidney disease: Secondary | ICD-10-CM | POA: Diagnosis not present

## 2023-12-29 DIAGNOSIS — Z6822 Body mass index (BMI) 22.0-22.9, adult: Secondary | ICD-10-CM | POA: Insufficient documentation

## 2023-12-29 DIAGNOSIS — I110 Hypertensive heart disease with heart failure: Secondary | ICD-10-CM | POA: Diagnosis present

## 2023-12-29 LAB — BASIC METABOLIC PANEL WITH GFR
Anion gap: 11 (ref 5–15)
BUN: 86 mg/dL — ABNORMAL HIGH (ref 8–23)
CO2: 34 mmol/L — ABNORMAL HIGH (ref 22–32)
Calcium: 9.8 mg/dL (ref 8.9–10.3)
Chloride: 95 mmol/L — ABNORMAL LOW (ref 98–111)
Creatinine, Ser: 1.61 mg/dL — ABNORMAL HIGH (ref 0.61–1.24)
GFR, Estimated: 44 mL/min — ABNORMAL LOW (ref >60)
Glucose, Bld: 63 mg/dL — ABNORMAL LOW (ref 70–99)
Potassium: 4.4 mmol/L (ref 3.5–5.1)
Sodium: 140 mmol/L (ref 135–145)

## 2023-12-29 LAB — BRAIN NATRIURETIC PEPTIDE: B Natriuretic Peptide: 55.7 pg/mL (ref 0.0–100.0)

## 2023-12-29 MED ORDER — SPIRONOLACTONE 25 MG PO TABS: 25.0000 mg | ORAL_TABLET | Freq: Every day | ORAL | 3 refills | Status: DC

## 2023-12-29 NOTE — Progress Notes (Signed)
 Advanced [facility] Failure Clinic Note   PCP: Lorren Greig PARAS, NP Cardiologist: Lurena MARLA Red, MD  HF Cardiologist: Dr. Cherrie  HPI: 78 y.o. male with history of CAD, chronic HFpEF with RV failure, DM, HTN, HLD, OSA and chronic respiratory failure on BiPAP, CHB s/p dual chamber PPM with LBBAP lead in 01/25, hx asbestos exposure with pleural plaque on prior HRCT but no ILD, remote tobacco use.    Appears to be evidence of RV dysfunction on echo dating back to 2019. L/RHC in 02/2018: Nonobstructive CAD involving RCA, PCWP mean 8, PA 46/16 (26), Fick CO/CI 4.45/2.07, TD CO/CI 3.64/1.69.    Admitted for acute decompensated HFpEF and RV failure 12/07/23. Echo showed EF 60-65%, interventricular septum is flattened, RV enlarged and HK, RVSP 58 mmHg, moderate TR. RHC showed RA 16, mPA 34, PCWP 11, TD CO/CI 3.8/1.8, PVR 6.0. Required ICU stay for BiPAP and inotrope/pressor support.   Seen by PCP 12/25/23. Noted to by hypotensive 74/38, asymptomatic. BUN >100. Referred to nutrition, ENT for dysphagia, and nephrology. Followed by [facility]. Has home heath PT.   He returns today for post-hospital heart failure follow up with son. Overall feeling much better. Biggest complaint is sacral pressure injury. NYHA IIIB-IV. Reports dyspnea and fatigue with exertion. Denies chest pain, near-syncope, orthopnea, palpitations, dizziness, and abnormal bleeding. Sleeps in recliner, using a walker around the house. BP at home around 94/60. Compliant with all medications, son assist with pill box. Lives with wife, who has dementia.    Past Medical History:  Diagnosis Date   Diabetes mellitus    High cholesterol    Hypertension     Current Outpatient Medications  Medication Sig Dispense Refill   amLODipine  (NORVASC ) 10 MG tablet Take 1 tablet (10 mg total) by mouth daily. 30 tablet 2   aspirin  EC 81 MG tablet Take 81 mg by mouth daily. Swallow whole.     blood glucose meter kit and supplies  Dispense based on patient and insurance preference. Use up to four times daily as directed. (FOR ICD-10 E10.9, E11.9). 1 each 0   Cholecalciferol (VITAMIN D-3) 25 MCG (1000 UT) CAPS Take 1 capsule by mouth every morning.     empagliflozin  (JARDIANCE ) 10 MG TABS tablet Take 1 tablet (10 mg total) by mouth daily before breakfast. 90 tablet 3   ezetimibe  (ZETIA ) 10 MG tablet Take 1 tablet (10 mg total) by mouth daily. 90 tablet 3   Insulin  NPH, Human,, Isophane, (HUMULIN  N KWIKPEN) 100 UNIT/ML Kiwkpen Inject 33 Units into the skin in the morning and at bedtime. 60 mL 1   Insulin  Pen Needle (DROPLET PEN NEEDLES) 31G X 8 MM MISC 1 each by Other route 2 (two) times daily with a meal. 200 each 0   levothyroxine  (SYNTHROID ) 50 MCG tablet Take 1 tablet (50 mcg total) by mouth daily. 90 tablet 0   metFORMIN  (GLUCOPHAGE ) 1000 MG tablet Take 1 tablet (1,000 mg total) by mouth 2 (two) times daily with a meal. 180 tablet 0   nitroGLYCERIN  (NITROSTAT ) 0.4 MG SL tablet Place 1 tablet (0.4 mg total) under the tongue every 5 (five) minutes as needed for chest pain. 25 tablet 3   spironolactone  (ALDACTONE ) 25 MG tablet Take 0.5 tablets (12.5 mg total) by mouth daily. 30 tablet 0   torsemide  (DEMADEX ) 20 MG tablet Take 1 tablet (20 mg total) by mouth daily. 30 tablet 0   vitamin B-12 (CYANOCOBALAMIN ) 500 MCG tablet Take 500 mcg by mouth daily.  atorvastatin  (LIPITOR) 40 MG tablet Take 40 mg by mouth daily. (Patient not taking: Reported on 12/29/2023)     No current facility-administered medications for this encounter.    No Known Allergies    Social History   Socioeconomic History   Marital status: Married    Spouse name: Not on file   Number of children: Not on file   Years of education: Not on file   Highest education level: Not on file  Occupational History   Not on file  Tobacco Use   Smoking status: Former    Current packs/day: 0.00    Types: Cigarettes    Quit date: 03/30/1980    Years since  quitting: 43.7    Passive exposure: Never   Smokeless tobacco: Current    Types: Chew, Snuff  Vaping Use   Vaping status: Never Used  Substance and Sexual Activity   Alcohol use: No   Drug use: No   Sexual activity: Not Currently  Other Topics Concern   Not on file  Social History Narrative   Not on file   Social Drivers of Health   Financial Resource Strain: Low Risk  (08/06/2023)   Overall Financial Resource Strain (CARDIA)    Difficulty of Paying Living Expenses: Not hard at all  Food Insecurity: No Food Insecurity (12/21/2023)   Hunger Vital Sign    Worried About Running Out of Food in the Last Year: Never true    Ran Out of Food in the Last Year: Never true  Transportation Needs: No Transportation Needs (12/21/2023)   PRAPARE - Administrator, Civil Service (Medical): No    Lack of Transportation (Non-Medical): No  Physical Activity: Sufficiently Active (08/06/2023)   Exercise Vital Sign    Days of Exercise per Week: 7 days    Minutes of Exercise per Session: 30 min  Stress: No Stress Concern Present (08/06/2023)   [employer] of Occupational Health - Occupational Stress Questionnaire    Feeling of Stress : Not at all  Social Connections: Moderately Isolated (12/08/2023)   Social Connection and Isolation Panel    Frequency of Communication with Friends and Family: More than three times a week    Frequency of Social Gatherings with Friends and Family: More than three times a week    Attends Religious Services: Never    Database administrator or Organizations: No    Attends Banker Meetings: Never    Marital Status: Married  Catering manager Violence: Not At Risk (12/21/2023)   Humiliation, Afraid, Rape, and Kick questionnaire    Fear of Current or Ex-Partner: No    Emotionally Abused: No    Physically Abused: No    Sexually Abused: No      Family History  Problem Relation Age of Onset   Heart failure Mother    Cancer Father     Diabetes Sister    Cancer Sister    Vitals:   12/29/23 1148  BP: (!) 98/58  Pulse: 77  SpO2: 94%  Weight: 75.9 kg (167 lb 6.4 oz)  Height: 6' (1.829 m)    Filed Weights   12/29/23 1148  Weight: 75.9 kg (167 lb 6.4 oz)    PHYSICAL EXAM: General: Elderly appearing. No distress on [place] Cardiac: JVP ~7cm. S1 and S2 present.  Extremities: Warm and dry.  BLE ankle edema and reddness.  Neuro: Alert and oriented x3. Affect pleasant. Arrived by [facility]  ECG (personally reviewed): Asensed Vpaced 77 bpm  St. Jude Device Interrogation: AF/AT burden <1%, VP 96%, no CorVue  ReDs reading: attempted, unsuccessful  ASSESSMENT & PLAN: End-Stage Chronic HFpEF / PAH / Cor Pulmonale - Evidence of RV dysfunction on echo dating back to 2019.  - L/RHC in 10/19: Nonobstructive CAD involving RCA, PCW 8, PA 46/16 (26), Fick CO/CI 4.45/2.07, TD CO/CI 3.64/1.69.  - PFTs in 2020 with mixed restrictive and obstructive defect. CT w/o evidence of ILD. - Echo 7/25: EF 60-65%, interventricular septum is flattened, RV enlarged and HK, RVSP 58 mmHg, mod TR - RHC 07/25: RA 16, PA mean 34, PCWP 11, TD CO/CI 3.8/1.8, unable to read Fick, PVR 6.0, PAPi 1.4 - Given severe CO2 retention suspect most WHO Group 3 disease but may have component of WHO Group 1.  - Reports adherence with home BiPAP - NYHA III. Volume difficult to assess, appears close to euvolemic. Weight up 2 lbs since admission.  - stop diamox  250 mg daily, likely needs high bicarb to compensate for retained CO2 - hypotensive: stop lisinopril  and chlorthalidone  - continue torsemide  20 mg daily. BMET/BNP today - continue jardiance  10 mg daily - increase spiro to 25 mg daily - Low-output/end stage PAH/cor pulmonale  2. AKI on CKD IIIa - sCr baseline 1.3-1.4. - sCr up to 1.88 and BUN >100 at PCP appointment 12/25/23. Also noted to be hypotensive - stop anti-hypertensive meds as above - repeat labs today   3. CHB s/p dual chamber PPM w/ LBBAP lead 4.  Atrial Flutter - Device interrogated by [facility]. Jude rep. Several very brief runs of atrial flutter noted, just seconds in duration.  - Off amiodarone  with pulmonary disease   5. 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 Failure to Thrive - Followed by [facility]  - Has home heath PT - Dysphagia. Referred to ENT by PCP - Referred to Nutritional Therapy by PCP   Follow up in 2 weeks with APP (repeat BMET, assess volume)  [place] Ronne Stefanski, NP 12/29/23

## 2023-12-29 NOTE — Patient Instructions (Signed)
 Medication Changes:  STOP DIAMOX    STOP CHLORTHALIDONE    STOP LISINOPRIL   INCREASE SPIRONOLACTONE  TO 25MG  ONCE DAILY   Lab Work:  Labs done today, your results will be available in MyChart, we will contact you for abnormal readings.  Follow-Up in: 2-3 WEEKS AS SCHEDULED   At the [facility] Failure Clinic, you and your health needs are our priority. We have a designated team specialized in the treatment of Heart Failure. This Care Team includes your primary Heart Failure Specialized Cardiologist (physician), Advanced Practice Providers (APPs- Physician Assistants and Nurse Practitioners), and Pharmacist who all work together to provide you with the care you need, when you need it.   You may see any of the following providers on your designated Care Team at your next follow up:  Dr. Toribio Fuel Dr. Ezra Shuck Dr. Ria Commander Dr. Odis Brownie Greig Mosses, NP Caffie Shed, [place] [facility] [place], [place] Beckey Coe, NP [place] Lee, NP Tinnie Redman, PharmD   Please be sure to bring in all your medications bottles to every appointment.   Need to Contact Us :  If you have any questions or concerns before your next appointment please send us  a message through [place] or call our office at [phone].    TO LEAVE A MESSAGE FOR THE NURSE SELECT OPTION 2, PLEASE LEAVE A MESSAGE INCLUDING: YOUR NAME DATE OF BIRTH CALL BACK NUMBER REASON FOR CALL**this is important as we prioritize the call backs  YOU WILL RECEIVE A CALL BACK THE SAME DAY AS LONG AS YOU CALL BEFORE 4:00 PM

## 2023-12-30 DIAGNOSIS — E039 Hypothyroidism, unspecified: Secondary | ICD-10-CM | POA: Diagnosis not present

## 2023-12-30 DIAGNOSIS — I152 Hypertension secondary to endocrine disorders: Secondary | ICD-10-CM | POA: Diagnosis not present

## 2023-12-30 DIAGNOSIS — E7800 Pure hypercholesterolemia, unspecified: Secondary | ICD-10-CM | POA: Diagnosis not present

## 2023-12-30 DIAGNOSIS — E1122 Type 2 diabetes mellitus with diabetic chronic kidney disease: Secondary | ICD-10-CM | POA: Diagnosis not present

## 2023-12-30 DIAGNOSIS — J449 Chronic obstructive pulmonary disease, unspecified: Secondary | ICD-10-CM | POA: Diagnosis not present

## 2023-12-30 DIAGNOSIS — K8020 Calculus of gallbladder without cholecystitis without obstruction: Secondary | ICD-10-CM | POA: Diagnosis not present

## 2023-12-30 DIAGNOSIS — N1831 Chronic kidney disease, stage 3a: Secondary | ICD-10-CM | POA: Diagnosis not present

## 2023-12-30 DIAGNOSIS — L03115 Cellulitis of right lower limb: Secondary | ICD-10-CM | POA: Diagnosis not present

## 2023-12-30 DIAGNOSIS — I442 Atrioventricular block, complete: Secondary | ICD-10-CM | POA: Diagnosis not present

## 2023-12-30 DIAGNOSIS — Z950 Presence of cardiac pacemaker: Secondary | ICD-10-CM | POA: Diagnosis not present

## 2023-12-30 DIAGNOSIS — G929 Unspecified toxic encephalopathy: Secondary | ICD-10-CM | POA: Diagnosis not present

## 2023-12-30 DIAGNOSIS — I50813 Acute on chronic right heart failure: Secondary | ICD-10-CM | POA: Diagnosis not present

## 2023-12-30 DIAGNOSIS — I272 Pulmonary hypertension, unspecified: Secondary | ICD-10-CM | POA: Diagnosis not present

## 2023-12-30 DIAGNOSIS — R1312 Dysphagia, oropharyngeal phase: Secondary | ICD-10-CM | POA: Diagnosis not present

## 2023-12-30 DIAGNOSIS — A419 Sepsis, unspecified organism: Secondary | ICD-10-CM | POA: Diagnosis not present

## 2023-12-30 DIAGNOSIS — Z87891 Personal history of nicotine dependence: Secondary | ICD-10-CM | POA: Diagnosis not present

## 2023-12-30 DIAGNOSIS — J9612 Chronic respiratory failure with hypercapnia: Secondary | ICD-10-CM | POA: Diagnosis not present

## 2023-12-30 DIAGNOSIS — G4733 Obstructive sleep apnea (adult) (pediatric): Secondary | ICD-10-CM | POA: Diagnosis not present

## 2023-12-30 DIAGNOSIS — N179 Acute kidney failure, unspecified: Secondary | ICD-10-CM | POA: Diagnosis not present

## 2023-12-30 DIAGNOSIS — I2510 Atherosclerotic heart disease of native coronary artery without angina pectoris: Secondary | ICD-10-CM | POA: Diagnosis not present

## 2023-12-30 DIAGNOSIS — I082 Rheumatic disorders of both aortic and tricuspid valves: Secondary | ICD-10-CM | POA: Diagnosis not present

## 2023-12-30 DIAGNOSIS — E1165 Type 2 diabetes mellitus with hyperglycemia: Secondary | ICD-10-CM | POA: Diagnosis not present

## 2023-12-30 DIAGNOSIS — E1159 Type 2 diabetes mellitus with other circulatory complications: Secondary | ICD-10-CM | POA: Diagnosis not present

## 2023-12-30 DIAGNOSIS — E43 Unspecified severe protein-calorie malnutrition: Secondary | ICD-10-CM | POA: Diagnosis not present

## 2023-12-30 DIAGNOSIS — I5033 Acute on chronic diastolic (congestive) heart failure: Secondary | ICD-10-CM | POA: Diagnosis not present

## 2023-12-31 ENCOUNTER — Telehealth: Payer: Self-pay | Admitting: Family

## 2023-12-31 NOTE — Telephone Encounter (Signed)
 A document form from [facility] has been faxed: Home Health Certificate (Order ID [id number]), to be filled out by provider. Send document back via Fax within 7-days. Document is located in providers tray at front office.          Fax number: [phone]

## 2023-12-31 NOTE — Telephone Encounter (Signed)
 Just received form today will give to PCP tomorrow

## 2024-01-01 ENCOUNTER — Telehealth: Payer: Self-pay | Admitting: *Deleted

## 2024-01-01 ENCOUNTER — Encounter: Payer: Self-pay | Admitting: *Deleted

## 2024-01-01 ENCOUNTER — Inpatient Hospital Stay: Admitting: Family

## 2024-01-01 DIAGNOSIS — I442 Atrioventricular block, complete: Secondary | ICD-10-CM | POA: Diagnosis not present

## 2024-01-01 DIAGNOSIS — E1122 Type 2 diabetes mellitus with diabetic chronic kidney disease: Secondary | ICD-10-CM | POA: Diagnosis not present

## 2024-01-01 DIAGNOSIS — A419 Sepsis, unspecified organism: Secondary | ICD-10-CM | POA: Diagnosis not present

## 2024-01-01 DIAGNOSIS — G4733 Obstructive sleep apnea (adult) (pediatric): Secondary | ICD-10-CM | POA: Diagnosis not present

## 2024-01-01 DIAGNOSIS — I50813 Acute on chronic right heart failure: Secondary | ICD-10-CM | POA: Diagnosis not present

## 2024-01-01 DIAGNOSIS — I082 Rheumatic disorders of both aortic and tricuspid valves: Secondary | ICD-10-CM | POA: Diagnosis not present

## 2024-01-01 DIAGNOSIS — E1159 Type 2 diabetes mellitus with other circulatory complications: Secondary | ICD-10-CM | POA: Diagnosis not present

## 2024-01-01 DIAGNOSIS — E039 Hypothyroidism, unspecified: Secondary | ICD-10-CM | POA: Diagnosis not present

## 2024-01-01 DIAGNOSIS — N1831 Chronic kidney disease, stage 3a: Secondary | ICD-10-CM | POA: Diagnosis not present

## 2024-01-01 DIAGNOSIS — K8020 Calculus of gallbladder without cholecystitis without obstruction: Secondary | ICD-10-CM | POA: Diagnosis not present

## 2024-01-01 DIAGNOSIS — E7800 Pure hypercholesterolemia, unspecified: Secondary | ICD-10-CM | POA: Diagnosis not present

## 2024-01-01 DIAGNOSIS — E43 Unspecified severe protein-calorie malnutrition: Secondary | ICD-10-CM | POA: Diagnosis not present

## 2024-01-01 DIAGNOSIS — J9612 Chronic respiratory failure with hypercapnia: Secondary | ICD-10-CM | POA: Diagnosis not present

## 2024-01-01 DIAGNOSIS — N179 Acute kidney failure, unspecified: Secondary | ICD-10-CM | POA: Diagnosis not present

## 2024-01-01 DIAGNOSIS — R1312 Dysphagia, oropharyngeal phase: Secondary | ICD-10-CM | POA: Diagnosis not present

## 2024-01-01 DIAGNOSIS — I272 Pulmonary hypertension, unspecified: Secondary | ICD-10-CM | POA: Diagnosis not present

## 2024-01-01 DIAGNOSIS — I5033 Acute on chronic diastolic (congestive) heart failure: Secondary | ICD-10-CM | POA: Diagnosis not present

## 2024-01-01 DIAGNOSIS — I152 Hypertension secondary to endocrine disorders: Secondary | ICD-10-CM | POA: Diagnosis not present

## 2024-01-01 DIAGNOSIS — E1165 Type 2 diabetes mellitus with hyperglycemia: Secondary | ICD-10-CM | POA: Diagnosis not present

## 2024-01-01 DIAGNOSIS — G929 Unspecified toxic encephalopathy: Secondary | ICD-10-CM | POA: Diagnosis not present

## 2024-01-01 DIAGNOSIS — Z87891 Personal history of nicotine dependence: Secondary | ICD-10-CM | POA: Diagnosis not present

## 2024-01-01 DIAGNOSIS — I2510 Atherosclerotic heart disease of native coronary artery without angina pectoris: Secondary | ICD-10-CM | POA: Diagnosis not present

## 2024-01-01 DIAGNOSIS — L03115 Cellulitis of right lower limb: Secondary | ICD-10-CM | POA: Diagnosis not present

## 2024-01-01 DIAGNOSIS — Z950 Presence of cardiac pacemaker: Secondary | ICD-10-CM | POA: Diagnosis not present

## 2024-01-01 DIAGNOSIS — J449 Chronic obstructive pulmonary disease, unspecified: Secondary | ICD-10-CM | POA: Diagnosis not present

## 2024-01-04 ENCOUNTER — Ambulatory Visit: Payer: Self-pay

## 2024-01-04 ENCOUNTER — Encounter: Payer: Self-pay | Admitting: Cardiology

## 2024-01-04 DIAGNOSIS — I152 Hypertension secondary to endocrine disorders: Secondary | ICD-10-CM | POA: Diagnosis not present

## 2024-01-04 DIAGNOSIS — I442 Atrioventricular block, complete: Secondary | ICD-10-CM | POA: Diagnosis not present

## 2024-01-04 DIAGNOSIS — E1159 Type 2 diabetes mellitus with other circulatory complications: Secondary | ICD-10-CM | POA: Diagnosis not present

## 2024-01-04 DIAGNOSIS — J9612 Chronic respiratory failure with hypercapnia: Secondary | ICD-10-CM | POA: Diagnosis not present

## 2024-01-04 DIAGNOSIS — G929 Unspecified toxic encephalopathy: Secondary | ICD-10-CM | POA: Diagnosis not present

## 2024-01-04 DIAGNOSIS — E1122 Type 2 diabetes mellitus with diabetic chronic kidney disease: Secondary | ICD-10-CM | POA: Diagnosis not present

## 2024-01-04 DIAGNOSIS — I272 Pulmonary hypertension, unspecified: Secondary | ICD-10-CM | POA: Diagnosis not present

## 2024-01-04 DIAGNOSIS — G4733 Obstructive sleep apnea (adult) (pediatric): Secondary | ICD-10-CM | POA: Diagnosis not present

## 2024-01-04 DIAGNOSIS — I5033 Acute on chronic diastolic (congestive) heart failure: Secondary | ICD-10-CM | POA: Diagnosis not present

## 2024-01-04 DIAGNOSIS — R1312 Dysphagia, oropharyngeal phase: Secondary | ICD-10-CM | POA: Diagnosis not present

## 2024-01-04 DIAGNOSIS — I50813 Acute on chronic right heart failure: Secondary | ICD-10-CM | POA: Diagnosis not present

## 2024-01-04 DIAGNOSIS — I2510 Atherosclerotic heart disease of native coronary artery without angina pectoris: Secondary | ICD-10-CM | POA: Diagnosis not present

## 2024-01-04 DIAGNOSIS — I082 Rheumatic disorders of both aortic and tricuspid valves: Secondary | ICD-10-CM | POA: Diagnosis not present

## 2024-01-04 DIAGNOSIS — N1831 Chronic kidney disease, stage 3a: Secondary | ICD-10-CM | POA: Diagnosis not present

## 2024-01-04 DIAGNOSIS — N179 Acute kidney failure, unspecified: Secondary | ICD-10-CM | POA: Diagnosis not present

## 2024-01-04 DIAGNOSIS — E43 Unspecified severe protein-calorie malnutrition: Secondary | ICD-10-CM | POA: Diagnosis not present

## 2024-01-04 DIAGNOSIS — A419 Sepsis, unspecified organism: Secondary | ICD-10-CM | POA: Diagnosis not present

## 2024-01-04 DIAGNOSIS — Z950 Presence of cardiac pacemaker: Secondary | ICD-10-CM | POA: Diagnosis not present

## 2024-01-04 DIAGNOSIS — Z87891 Personal history of nicotine dependence: Secondary | ICD-10-CM | POA: Diagnosis not present

## 2024-01-04 DIAGNOSIS — K8020 Calculus of gallbladder without cholecystitis without obstruction: Secondary | ICD-10-CM | POA: Diagnosis not present

## 2024-01-04 DIAGNOSIS — E7800 Pure hypercholesterolemia, unspecified: Secondary | ICD-10-CM | POA: Diagnosis not present

## 2024-01-04 DIAGNOSIS — J449 Chronic obstructive pulmonary disease, unspecified: Secondary | ICD-10-CM | POA: Diagnosis not present

## 2024-01-04 DIAGNOSIS — L03115 Cellulitis of right lower limb: Secondary | ICD-10-CM | POA: Diagnosis not present

## 2024-01-04 DIAGNOSIS — E039 Hypothyroidism, unspecified: Secondary | ICD-10-CM | POA: Diagnosis not present

## 2024-01-04 DIAGNOSIS — E1165 Type 2 diabetes mellitus with hyperglycemia: Secondary | ICD-10-CM | POA: Diagnosis not present

## 2024-01-04 NOTE — Telephone Encounter (Signed)
 Gave to PCP today to sign

## 2024-01-04 NOTE — Telephone Encounter (Signed)
-   Patient established with [facility] (last office visit 12/29/2023). Please consult with the same for advisement.  - Patient established with [facility] (last office visit 11/06/2023). Also, patient has upcoming appointment. Please consult with the same for advisement.  - During the interim report to the Emergency Department/Urgent Care/call 911 for immediate medical evaluation.

## 2024-01-04 NOTE — Telephone Encounter (Signed)
 FYI Only or Action Required?: Action required by provider: Homehealth nurse called to report bp of 70/40 on Friday.  States did come up to 90/50.  Heart rate was fine; no increase or decrease.  Patient wants to know if he can come off of O2, currently at 2 Liters.  Please call her [phone].   Patient was last seen in primary care on 12/25/2023 by Lorren Greig PARAS, NP.  Called Nurse Triage reporting Advice Only.  Symptoms began several days ago.  Interventions attempted: Nothing.  Symptoms are: stable.  Triage Disposition: No disposition on file.  Patient/caregiver understands and will follow disposition?:    Copied from CRM [id number]. Topic: Clinical - Red Word Triage >> Jan 04, 2024  1:16 PM Jayma L wrote: Red Word that prompted transfer to Nurse Triage:   Arnette from home health called and stated she seen patient on Friday and his blood pressure was 90/50 then a few mins after walking it was then 78/48 .Robert Ramirez Patient also is on oxygen and wants to know if he can be taken off it .

## 2024-01-05 ENCOUNTER — Ambulatory Visit: Payer: Self-pay

## 2024-01-05 NOTE — Telephone Encounter (Addendum)
 FYI Only or Action Required?: FYI only for provider.  Patient was last seen in primary care on 12/25/2023 by Lorren Greig PARAS, NP.  Called Nurse Triage reporting Information Only.  Symptoms began yesterday.  Interventions attempted: Other: Unable to reach patient to inform him of ED disposition.  Symptoms are: Home health nurse will reassess B/P.  Triage Disposition: Information or Advice Only Call  Patient/caregiver understands and will follow disposition?: Yes  **See note below; see previous encounter from 8/11**             Reason for Disposition  Health information question, no triage required and triager able to answer question  Answer Assessment - Initial Assessment Questions 1. REASON FOR CALL: What is the main reason for your call? or How can I best help you?   Arielle, the patients home health nurse was returning a call from yesterday. She was advised of ED disposition recommended for the patients low B/P. She is on her way to him now, and will reassess his B/P; and if still low, he will be taken to the ED.  Protocols used: Information Only Call - No Triage-A-AH

## 2024-01-05 NOTE — Telephone Encounter (Signed)
 I called patient back and he stated he did not call here and I called the number that was left on message [phone] no one answered so I left a voicemail to return my call.

## 2024-01-06 NOTE — Telephone Encounter (Signed)
 Report to Emergency Department/Urgent Care/call 911 for immediate medical evaluation.

## 2024-01-07 NOTE — Telephone Encounter (Signed)
 I faxed paperwork back today 01/07/2024

## 2024-01-11 ENCOUNTER — Telehealth (HOSPITAL_COMMUNITY): Payer: Self-pay

## 2024-01-11 NOTE — Progress Notes (Incomplete)
 Advanced Heart Failure Clinic Note   PCP: Lorren Greig PARAS, NP Cardiologist: Lurena MARLA Red, MD  HF Cardiologist: Dr. Cherrie  HPI: 78 y.o. male with history of CAD, chronic HFpEF with RV failure, DM, HTN, HLD, OSA and chronic respiratory failure on BiPAP, CHB s/p dual chamber PPM with LBBAP lead in 01/25, hx asbestos exposure with pleural plaque on prior HRCT but no ILD, remote tobacco use.    Appears to be evidence of RV dysfunction on echo dating back to 2019. L/RHC in 02/2018: Nonobstructive CAD involving RCA, PCWP mean 8, PA 46/16 (26), Fick CO/CI 4.45/2.07, TD CO/CI 3.64/1.69.    Admitted for acute decompensated HFpEF and RV failure 12/07/23. Echo showed EF 60-65%, interventricular septum is flattened, RV enlarged and HK, RVSP 58 mmHg, moderate TR. RHC showed RA 16, mPA 34, PCWP 11, TD CO/CI 3.8/1.8, PVR 6.0. Required ICU stay for BiPAP and inotrope/pressor support.   Seen by PCP 12/25/23. Noted to by hypotensive 74/38, asymptomatic. BUN >100. Referred to nutrition, ENT for dysphagia, and nephrology. Followed by [facility]. Has home heath PT.   He returns today for post-hospital heart failure follow up with son. Overall feeling much better. Biggest complaint is sacral pressure injury. NYHA IIIB-IV. Reports dyspnea and fatigue with exertion. Denies chest pain, near-syncope, orthopnea, palpitations, dizziness, and abnormal bleeding. Sleeps in recliner, using a walker around the house. BP at home around 94/60. Compliant with all medications, son assist with pill box. Lives with wife, who has dementia.    Past Medical History:  Diagnosis Date   Diabetes mellitus    High cholesterol    Hypertension     Current Outpatient Medications  Medication Sig Dispense Refill   amLODipine  (NORVASC ) 10 MG tablet Take 1 tablet (10 mg total) by mouth daily. 30 tablet 2   aspirin  EC 81 MG tablet Take 81 mg by mouth daily. Swallow whole.     atorvastatin  (LIPITOR) 40 MG tablet Take  40 mg by mouth daily. (Patient not taking: Reported on 12/29/2023)     blood glucose meter kit and supplies Dispense based on patient and insurance preference. Use up to four times daily as directed. (FOR ICD-10 E10.9, E11.9). 1 each 0   Cholecalciferol (VITAMIN D-3) 25 MCG (1000 UT) CAPS Take 1 capsule by mouth every morning.     empagliflozin  (JARDIANCE ) 10 MG TABS tablet Take 1 tablet (10 mg total) by mouth daily before breakfast. 90 tablet 3   ezetimibe  (ZETIA ) 10 MG tablet Take 1 tablet (10 mg total) by mouth daily. 90 tablet 3   Insulin  NPH, Human,, Isophane, (HUMULIN  N KWIKPEN) 100 UNIT/ML Kiwkpen Inject 33 Units into the skin in the morning and at bedtime. 60 mL 1   Insulin  Pen Needle (DROPLET PEN NEEDLES) 31G X 8 MM MISC 1 each by Other route 2 (two) times daily with a meal. 200 each 0   levothyroxine  (SYNTHROID ) 50 MCG tablet Take 1 tablet (50 mcg total) by mouth daily. 90 tablet 0   metFORMIN  (GLUCOPHAGE ) 1000 MG tablet Take 1 tablet (1,000 mg total) by mouth 2 (two) times daily with a meal. 180 tablet 0   nitroGLYCERIN  (NITROSTAT ) 0.4 MG SL tablet Place 1 tablet (0.4 mg total) under the tongue every 5 (five) minutes as needed for chest pain. 25 tablet 3   spironolactone  (ALDACTONE ) 25 MG tablet Take 1 tablet (25 mg total) by mouth daily. 90 tablet 3   torsemide  (DEMADEX ) 20 MG tablet Take 1 tablet (20 mg total) by  mouth daily. 30 tablet 0   vitamin B-12 (CYANOCOBALAMIN ) 500 MCG tablet Take 500 mcg by mouth daily.     No current facility-administered medications for this visit.    No Known Allergies    Social History   Socioeconomic History   Marital status: Married    Spouse name: Not on file   Number of children: Not on file   Years of education: Not on file   Highest education level: Not on file  Occupational History   Not on file  Tobacco Use   Smoking status: Former    Current packs/day: 0.00    Types: Cigarettes    Quit date: 03/30/1980    Years since quitting: 43.8     Passive exposure: Never   Smokeless tobacco: Current    Types: Chew, Snuff  Vaping Use   Vaping status: Never Used  Substance and Sexual Activity   Alcohol use: No   Drug use: No   Sexual activity: Not Currently  Other Topics Concern   Not on file  Social History Narrative   Not on file   Social Drivers of Health   Financial Resource Strain: Low Risk  (08/06/2023)   Overall Financial Resource Strain (CARDIA)    Difficulty of Paying Living Expenses: Not hard at all  Food Insecurity: No Food Insecurity (12/21/2023)   Hunger Vital Sign    Worried About Running Out of Food in the Last Year: Never true    Ran Out of Food in the Last Year: Never true  Transportation Needs: No Transportation Needs (12/21/2023)   PRAPARE - Administrator, Civil Service (Medical): No    Lack of Transportation (Non-Medical): No  Physical Activity: Sufficiently Active (08/06/2023)   Exercise Vital Sign    Days of Exercise per Week: 7 days    Minutes of Exercise per Session: 30 min  Stress: No Stress Concern Present (08/06/2023)   [employer] of Occupational Health - Occupational Stress Questionnaire    Feeling of Stress : Not at all  Social Connections: Moderately Isolated (12/08/2023)   Social Connection and Isolation Panel    Frequency of Communication with Friends and Family: More than three times a week    Frequency of Social Gatherings with Friends and Family: More than three times a week    Attends Religious Services: Never    Database administrator or Organizations: No    Attends Banker Meetings: Never    Marital Status: Married  Catering manager Violence: Not At Risk (12/21/2023)   Humiliation, Afraid, Rape, and Kick questionnaire    Fear of Current or Ex-Partner: No    Emotionally Abused: No    Physically Abused: No    Sexually Abused: No      Family History  Problem Relation Age of Onset   Heart failure Mother    Cancer Father    Diabetes Sister     Cancer Sister    There were no vitals filed for this visit.   There were no vitals filed for this visit.   PHYSICAL EXAM: General: Elderly appearing. No distress on [place] Cardiac: JVP ~7cm. S1 and S2 present.  Extremities: Warm and dry.  BLE ankle edema and reddness.  Neuro: Alert and oriented x3. Affect pleasant. Arrived by [facility]  ECG (personally reviewed): Asensed Vpaced 77 bpm  St. Jude Device Interrogation: AF/AT burden <1%, VP 96%, no CorVue  ReDs reading: attempted, unsuccessful  ASSESSMENT & PLAN: End-Stage Chronic HFpEF / PAH /  Cor Pulmonale - Evidence of RV dysfunction on echo dating back to 2019.  - L/RHC in 10/19: Nonobstructive CAD involving RCA, PCW 8, PA 46/16 (26), Fick CO/CI 4.45/2.07, TD CO/CI 3.64/1.69.  - PFTs in 2020 with mixed restrictive and obstructive defect. CT w/o evidence of ILD. - Echo 7/25: EF 60-65%, interventricular septum is flattened, RV enlarged and HK, RVSP 58 mmHg, mod TR - RHC 07/25: RA 16, PA mean 34, PCWP 11, TD CO/CI 3.8/1.8, unable to read Fick, PVR 6.0, PAPi 1.4 - Given severe CO2 retention suspect most WHO Group 3 disease but may have component of WHO Group 1.  - Reports adherence with home BiPAP - NYHA III. Volume difficult to assess, appears close to euvolemic. Weight up 2 lbs since admission.  - stop diamox  250 mg daily, likely needs high bicarb to compensate for retained CO2 - hypotensive: stop lisinopril  and chlorthalidone  - continue torsemide  20 mg daily. BMET/BNP today - continue jardiance  10 mg daily - increase spiro to 25 mg daily - Low-output/end stage PAH/cor pulmonale  2. AKI on CKD IIIa - sCr baseline 1.3-1.4. - sCr up to 1.88 and BUN >100 at PCP appointment 12/25/23. Also noted to be hypotensive - stop anti-hypertensive meds as above - repeat labs today   3. CHB s/p dual chamber PPM w/ LBBAP lead 4. Atrial Flutter - Device interrogated by [employer] Jude rep. Several very brief runs of atrial flutter noted, just seconds in  duration.  - Off amiodarone  with pulmonary disease   5. 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 Failure to Thrive - Followed by [facility]  - Has home heath PT - Dysphagia. Referred to ENT by PCP - Referred to Nutritional Therapy by PCP   Follow up in 2 weeks with APP (repeat BMET, assess volume)  Harlene CHRISTELLA Gainer, FNP 01/11/24

## 2024-01-11 NOTE — Telephone Encounter (Signed)
 Called to confirm/remind patient of their appointment at the [facility] on 01/12/24.   Appointment:   [x] Confirmed  [] Left mess   [] No answer/No voice mail  [] VM Full/unable to leave message  [] Phone not in service  Patient reminded to bring all medications and/or complete list.  Confirmed patient has transportation. Gave directions, instructed to utilize valet parking.

## 2024-01-12 ENCOUNTER — Other Ambulatory Visit: Payer: Self-pay | Admitting: *Deleted

## 2024-01-12 ENCOUNTER — Encounter (HOSPITAL_COMMUNITY)

## 2024-01-12 DIAGNOSIS — G4733 Obstructive sleep apnea (adult) (pediatric): Secondary | ICD-10-CM | POA: Diagnosis not present

## 2024-01-12 NOTE — Patient Instructions (Signed)
 Visit Information  Thank you for taking time to visit with me today. Please don't hesitate to contact me if I can be of assistance to you before our next scheduled telephone appointment.    Following is a copy of your care plan:   Goals Addressed             This Visit's Progress    VBCI Transitions of Care (TOC) Care Plan       Problems:  Recent Hospitalization for treatment of CHF Equipment/DME barrier patient requesting a walker and Knowledge Deficit Related to HF  Goal:  Over the next 30 days, the patient will not experience hospital readmission  Interventions:  Transitions of Care: Doctor Visits  - discussed the importance of doctor visits Discussed PT is on hold due to his wife being hospitalized Provided therapeutic listening Collaborated with PCP to request a POC to allow patient to visit his wife in the hospital  Heart Failure Interventions: Basic overview and discussion of pathophysiology of Heart Failure reviewed Provided education on low sodium diet Advised patient to weigh each morning after emptying bladder Discussed importance of daily weight and advised patient to weigh and record daily Discussed the importance of keeping all appointments with provider Reviewed upcoming HF Clinic appointment on 01/12/24, patient is unable to keep this appointment due to transportation Collaborated with HF Clinic to request to reschedule today's visit, new appointment made for 01/21/27 at 12 noon, patient aware  Patient Self Care Activities:  Attend all scheduled provider appointments Call pharmacy for medication refills 3-7 days in advance of running out of medications Participate in Transition of Care Program/Attend TOC scheduled calls Take medications as prescribed   call office if I gain more than 2 pounds in one day or 5 pounds in one week use salt in moderation weigh myself daily  Plan:  Telephone follow up appointment with care management team member scheduled for:   01/20/24 at 1:30pm        The patient verbalized understanding of instructions, educational materials, and care plan provided today and agreed to receive a mailed copy of patient instructions, educational materials, and care plan.   Telephone follow up appointment with care management team member scheduled for:01/20/24 at 1:30pm  Please call the care guide team at [phone] if you need to cancel or reschedule your appointment.   Please call [phone]-TALK (toll free, 24 hour hotline) go to [facility] [address] [place] [phone]) call 911 if you are experiencing a Mental Health or Behavioral Health Crisis or need someone to talk to.  Andrea Dimes RN, BSN [place]  [facility] RN Care Manager [phone]

## 2024-01-12 NOTE — Transitions of Care (Post Inpatient/ED Visit) (Signed)
 Transition of Care week 3  Visit Note  01/12/2024  Name: Robert Ramirez MRN: [id number]          DOB: 1946/01/07  Situation: Patient enrolled in [facility] 30-day program. Visit completed with Robert Ramirez by telephone.   Background:   Initial Transition Care Management Follow-up Telephone Call    Past Medical History:  Diagnosis Date   Diabetes mellitus    High cholesterol    Hypertension     Assessment: Patient Reported Symptoms: Cognitive Cognitive Status: Able to follow simple commands, Alert and oriented to person, place, and time, Normal speech and language skills, Insightful and able to interpret abstract concepts      Neurological Neurological Review of Symptoms: No symptoms reported    HEENT HEENT Symptoms Reported: No symptoms reported      Cardiovascular Cardiovascular Symptoms Reported: No symptoms reported    Respiratory Respiratory Symptoms Reported: No symptoms reported Other Respiratory Symptoms: continues to use oxygen at 2L during the day and BIPAP at night. Pulse Ox 96% Respiratory Management Strategies: BiPAP, Routine screening, Adequate rest  Endocrine Endocrine Symptoms Reported: No symptoms reported Is patient diabetic?: Yes Is patient checking blood sugars at home?: Yes List most recent blood sugar readings, include date and time of day: Fasting yesterday 91, today 157 Endocrine Self-Management Outcome: 4 (good)  Gastrointestinal Gastrointestinal Symptoms Reported: Constipation Additional Gastrointestinal Details: LBM 01/12/24 Gastrointestinal Management Strategies: Exercise, Medication therapy Gastrointestinal Self-Management Outcome: 4 (good) Gastrointestinal Comment: Patient takes Miralax  everyother day    Genitourinary Genitourinary Symptoms Reported: Not assessed    Integumentary Integumentary Symptoms Reported: Not assessed    Musculoskeletal Musculoskelatal Symptoms Reviewed: Limited mobility Musculoskeletal Management Strategies: Exercise,  Routine screening, Medical device Musculoskeletal Self-Management Outcome: 4 (good) Musculoskeletal Comment: Ambulating with cane when out of the house. PT on hold due to patient's wife being hospitalized. Patient continues to do provided exercises, reports imporvement with strength and mobility.      Psychosocial Psychosocial Symptoms Reported: No symptoms reported         Vitals:   01/12/24 1340  SpO2: 96%    Medications Reviewed Today     Reviewed by Lucky Andrea LABOR, RN (Registered Nurse) on 01/12/24 at 1344  Med List Status: <None>   Medication Order Taking? Sig Documenting Provider Last Dose Status Informant  amLODipine  (NORVASC ) 10 MG tablet [id number] Yes Take 1 tablet (10 mg total) by mouth daily. Lorren Greig PARAS, NP  Active Self, Pharmacy Records  aspirin  EC 81 MG tablet [id number] Yes Take 81 mg by mouth daily. Swallow whole. [provider]  Active Self, Pharmacy Records  atorvastatin  (LIPITOR) 40 MG tablet [id number]  Take 40 mg by mouth daily.  Patient not taking: Reported on 01/12/2024   [provider]  Active Self, Pharmacy Records  blood glucose meter kit and supplies [id number] Yes Dispense based on patient and insurance preference. Use up to four times daily as directed. (FOR ICD-10 E10.9, E11.9). Lorren Greig PARAS, NP  Active Self, Pharmacy Records  Cholecalciferol (VITAMIN D-3) 25 MCG (1000 UT) CAPS [id number] Yes Take 1 capsule by mouth every morning. [provider]  Active Self, Pharmacy Records  empagliflozin  (JARDIANCE ) 10 MG TABS tablet [id number] Yes Take 1 tablet (10 mg total) by mouth daily before breakfast. Thukkani, Arun K, MD  Active Self, Pharmacy Records  ezetimibe  (ZETIA ) 10 MG tablet [id number] Yes Take 1 tablet (10 mg total) by mouth daily. Thukkani, Arun K, MD  Active Self, Pharmacy Records  Insulin  NPH, Human,, Isophane, (HUMULIN  N  KWIKPEN) 100 UNIT/ML Alphonsus [id number] Yes Inject 33 Units into the skin in the morning and at  bedtime. Newlin, Enobong, MD  Active Self, Pharmacy Records  Insulin  Pen Needle (DROPLET PEN NEEDLES) 31G X 8 MM MISC [id number] Yes 1 each by Other route 2 (two) times daily with a meal. Lorren Greig PARAS, NP  Active Self, Pharmacy Records  levothyroxine  (SYNTHROID ) 50 MCG tablet [id number] Yes Take 1 tablet (50 mcg total) by mouth daily. Lorren Greig PARAS, NP  Active   metFORMIN  (GLUCOPHAGE ) 1000 MG tablet [id number] Yes Take 1 tablet (1,000 mg total) by mouth 2 (two) times daily with a meal. Lorren Greig PARAS, NP  Active   nitroGLYCERIN  (NITROSTAT ) 0.4 MG SL tablet [id number] Yes Place 1 tablet (0.4 mg total) under the tongue every 5 (five) minutes as needed for chest pain. Thukkani, Arun K, MD  Active Self, Pharmacy Records  spironolactone  (ALDACTONE ) 25 MG tablet [id number] Yes Take 1 tablet (25 mg total) by mouth daily. Lee, [place], NP  Active   torsemide  (DEMADEX ) 20 MG tablet [id number]  Take 1 tablet (20 mg total) by mouth daily. Elpidio Reyes DEL, MD  Active   vitamin B-12 (CYANOCOBALAMIN ) 500 MCG tablet [id number] Yes Take 500 mcg by mouth daily. [provider]  Active Self, Pharmacy Records            Recommendation:   DME requests:  other Portable oxygen concentrator  Follow Up Plan:   Telephone follow-up in 1 week  Andrea Dimes RN, BSN [place]  [facility] Health RN Care Manager [phone]

## 2024-01-13 DIAGNOSIS — E7800 Pure hypercholesterolemia, unspecified: Secondary | ICD-10-CM | POA: Diagnosis not present

## 2024-01-13 DIAGNOSIS — I50813 Acute on chronic right heart failure: Secondary | ICD-10-CM | POA: Diagnosis not present

## 2024-01-13 DIAGNOSIS — K8020 Calculus of gallbladder without cholecystitis without obstruction: Secondary | ICD-10-CM | POA: Diagnosis not present

## 2024-01-13 DIAGNOSIS — E1165 Type 2 diabetes mellitus with hyperglycemia: Secondary | ICD-10-CM | POA: Diagnosis not present

## 2024-01-13 DIAGNOSIS — I152 Hypertension secondary to endocrine disorders: Secondary | ICD-10-CM | POA: Diagnosis not present

## 2024-01-13 DIAGNOSIS — E1159 Type 2 diabetes mellitus with other circulatory complications: Secondary | ICD-10-CM | POA: Diagnosis not present

## 2024-01-13 DIAGNOSIS — Z87891 Personal history of nicotine dependence: Secondary | ICD-10-CM | POA: Diagnosis not present

## 2024-01-13 DIAGNOSIS — Z950 Presence of cardiac pacemaker: Secondary | ICD-10-CM | POA: Diagnosis not present

## 2024-01-13 DIAGNOSIS — E039 Hypothyroidism, unspecified: Secondary | ICD-10-CM | POA: Diagnosis not present

## 2024-01-13 DIAGNOSIS — J9612 Chronic respiratory failure with hypercapnia: Secondary | ICD-10-CM | POA: Diagnosis not present

## 2024-01-13 DIAGNOSIS — E43 Unspecified severe protein-calorie malnutrition: Secondary | ICD-10-CM | POA: Diagnosis not present

## 2024-01-13 DIAGNOSIS — I082 Rheumatic disorders of both aortic and tricuspid valves: Secondary | ICD-10-CM | POA: Diagnosis not present

## 2024-01-13 DIAGNOSIS — G4733 Obstructive sleep apnea (adult) (pediatric): Secondary | ICD-10-CM | POA: Diagnosis not present

## 2024-01-13 DIAGNOSIS — E1122 Type 2 diabetes mellitus with diabetic chronic kidney disease: Secondary | ICD-10-CM | POA: Diagnosis not present

## 2024-01-13 DIAGNOSIS — I2510 Atherosclerotic heart disease of native coronary artery without angina pectoris: Secondary | ICD-10-CM | POA: Diagnosis not present

## 2024-01-13 DIAGNOSIS — L03115 Cellulitis of right lower limb: Secondary | ICD-10-CM | POA: Diagnosis not present

## 2024-01-13 DIAGNOSIS — I5033 Acute on chronic diastolic (congestive) heart failure: Secondary | ICD-10-CM | POA: Diagnosis not present

## 2024-01-13 DIAGNOSIS — I272 Pulmonary hypertension, unspecified: Secondary | ICD-10-CM | POA: Diagnosis not present

## 2024-01-13 DIAGNOSIS — G929 Unspecified toxic encephalopathy: Secondary | ICD-10-CM | POA: Diagnosis not present

## 2024-01-13 DIAGNOSIS — N179 Acute kidney failure, unspecified: Secondary | ICD-10-CM | POA: Diagnosis not present

## 2024-01-13 DIAGNOSIS — J449 Chronic obstructive pulmonary disease, unspecified: Secondary | ICD-10-CM | POA: Diagnosis not present

## 2024-01-13 DIAGNOSIS — A419 Sepsis, unspecified organism: Secondary | ICD-10-CM | POA: Diagnosis not present

## 2024-01-13 DIAGNOSIS — R1312 Dysphagia, oropharyngeal phase: Secondary | ICD-10-CM | POA: Diagnosis not present

## 2024-01-13 DIAGNOSIS — N1831 Chronic kidney disease, stage 3a: Secondary | ICD-10-CM | POA: Diagnosis not present

## 2024-01-13 DIAGNOSIS — I442 Atrioventricular block, complete: Secondary | ICD-10-CM | POA: Diagnosis not present

## 2024-01-14 DIAGNOSIS — Z950 Presence of cardiac pacemaker: Secondary | ICD-10-CM | POA: Diagnosis not present

## 2024-01-14 DIAGNOSIS — E7800 Pure hypercholesterolemia, unspecified: Secondary | ICD-10-CM | POA: Diagnosis not present

## 2024-01-14 DIAGNOSIS — E1165 Type 2 diabetes mellitus with hyperglycemia: Secondary | ICD-10-CM | POA: Diagnosis not present

## 2024-01-14 DIAGNOSIS — E1159 Type 2 diabetes mellitus with other circulatory complications: Secondary | ICD-10-CM | POA: Diagnosis not present

## 2024-01-14 DIAGNOSIS — Z87891 Personal history of nicotine dependence: Secondary | ICD-10-CM | POA: Diagnosis not present

## 2024-01-14 DIAGNOSIS — K8020 Calculus of gallbladder without cholecystitis without obstruction: Secondary | ICD-10-CM | POA: Diagnosis not present

## 2024-01-14 DIAGNOSIS — G4733 Obstructive sleep apnea (adult) (pediatric): Secondary | ICD-10-CM | POA: Diagnosis not present

## 2024-01-14 DIAGNOSIS — A419 Sepsis, unspecified organism: Secondary | ICD-10-CM | POA: Diagnosis not present

## 2024-01-14 DIAGNOSIS — N179 Acute kidney failure, unspecified: Secondary | ICD-10-CM | POA: Diagnosis not present

## 2024-01-14 DIAGNOSIS — E039 Hypothyroidism, unspecified: Secondary | ICD-10-CM | POA: Diagnosis not present

## 2024-01-14 DIAGNOSIS — I2510 Atherosclerotic heart disease of native coronary artery without angina pectoris: Secondary | ICD-10-CM | POA: Diagnosis not present

## 2024-01-14 DIAGNOSIS — I082 Rheumatic disorders of both aortic and tricuspid valves: Secondary | ICD-10-CM | POA: Diagnosis not present

## 2024-01-14 DIAGNOSIS — G929 Unspecified toxic encephalopathy: Secondary | ICD-10-CM | POA: Diagnosis not present

## 2024-01-14 DIAGNOSIS — I272 Pulmonary hypertension, unspecified: Secondary | ICD-10-CM | POA: Diagnosis not present

## 2024-01-14 DIAGNOSIS — J449 Chronic obstructive pulmonary disease, unspecified: Secondary | ICD-10-CM | POA: Diagnosis not present

## 2024-01-14 DIAGNOSIS — I152 Hypertension secondary to endocrine disorders: Secondary | ICD-10-CM | POA: Diagnosis not present

## 2024-01-14 DIAGNOSIS — I50813 Acute on chronic right heart failure: Secondary | ICD-10-CM | POA: Diagnosis not present

## 2024-01-14 DIAGNOSIS — R1312 Dysphagia, oropharyngeal phase: Secondary | ICD-10-CM | POA: Diagnosis not present

## 2024-01-14 DIAGNOSIS — E43 Unspecified severe protein-calorie malnutrition: Secondary | ICD-10-CM | POA: Diagnosis not present

## 2024-01-14 DIAGNOSIS — L03115 Cellulitis of right lower limb: Secondary | ICD-10-CM | POA: Diagnosis not present

## 2024-01-14 DIAGNOSIS — N1831 Chronic kidney disease, stage 3a: Secondary | ICD-10-CM | POA: Diagnosis not present

## 2024-01-14 DIAGNOSIS — J9612 Chronic respiratory failure with hypercapnia: Secondary | ICD-10-CM | POA: Diagnosis not present

## 2024-01-14 DIAGNOSIS — E1122 Type 2 diabetes mellitus with diabetic chronic kidney disease: Secondary | ICD-10-CM | POA: Diagnosis not present

## 2024-01-14 DIAGNOSIS — I442 Atrioventricular block, complete: Secondary | ICD-10-CM | POA: Diagnosis not present

## 2024-01-14 DIAGNOSIS — I5033 Acute on chronic diastolic (congestive) heart failure: Secondary | ICD-10-CM | POA: Diagnosis not present

## 2024-01-18 NOTE — Progress Notes (Signed)
 @Patient  ID: Robert Ramirez, male    DOB: Oct 30, 1945, 78 y.o.   MRN: [id number]  No chief complaint on file.   Referring provider: Lorren Greig PARAS, NP  HPI: 78 year old male, former smoker. PMH significant CHF, HTN, CAD, secondary pulmonary hypertension, chronic respiratory failure, OSA on CPAP, pleural plaque, asbestos exposure, diabetes, hyperlipidemia. Patient of Dr. Jude, last seen in 2020.   Previous [facility] pulmonary encounter:  11/06/2023 Discussed the use of AI scribe software for clinical note transcription with the patient, who gave verbal consent to proceed.  History of Present Illness   Robert Ramirez is a 78 year old male with chronic respiratory failure who presents with a malfunctioning BiPAP machine.  His BiPAP machine stopped working approximately five to six weeks ago after he accidentally hit a button or knob while replacing the water  jacket. He contacted a service for calibration but was unable to provide the necessary settings. His previous settings were a max inspiratory pressure (IPAP) of 11 and a min expiratory pressure (EPAP) of 7. He was sleeping well with these settings.  He has a history of chronic respiratory failure and was previously on a Trilogy machine, which was discontinued due to cost, leading to a switch to BiPAP therapy in 2020. He underwent a BiPAP titration study in 2019, confirming optimal pressure settings of 11/7cm h20. Since the BiPAP stopped working, he has experienced increased fatigue and weakness, making it difficult to walk down the hallway. His sleep quality has deteriorated, with frequent awakenings and concerns about nasal congestion affecting his sleep.  He uses an albuterol  inhaler on an as-needed basis, approximately two to three times a week, primarily in the morning to relieve lung congestion. He has a history of asbestos exposure with pleural plaques noted on a CT scan in 2020. No persistent cough or mucus production and reports stable  weight over the past several months.  01/19/2024- Interim hx  Discussed the use of AI scribe software for clinical note transcription with the patient, who gave verbal consent to proceed.  History of Present Illness Darreon Lutes is a 78 year old male with pleural disease who presents for a follow-up on his BiPAP therapy.  He recently received a new BiPAP machine and initially had difficulty learning to use it, but it is now functioning well. The pressure settings are 11/ 7cm h20, and he is using oxygen with the BiPAP since his recent hospitalization. His apnea events are well controlled, with less than half an event per hour. He sleeps well, but wakes frequently to urinate, which he attributes to his diuretic medications, torsemide  and spironolactone .  He has a history of asbestos exposure leading to pleural disease, with a CT scan from 2020 showing pleural plaques and some scarring, but no evidence of neoplasm or fibrosis.  He uses albuterol  as needed for phlegm in his chest, which he experiences some mornings. He uses a couple of puffs of albuterol  and coughs up the phlegm after about ten minutes. No blood or color in the mucus. He sometimes goes for three to four weeks without needing albuterol , but at other times, he may need it daily for two to three days.  He is on torsemide  and spironolactone , which he takes as part of his medication regimen. He reports frequent urination at night, which he attributes to these medications.   Airview download 12/20/23-01/18/24 Usage days 30/30 days (100%)  Average usage 9 hours 4 mins Pressure 11/7cm h20 Airleaks 15L/min AHI 0.5  No Known  Allergies  Immunization History  Administered Date(s) Administered   Fluad Trivalent(High Dose 65+) 07/03/2023   INFLUENZA, HIGH DOSE SEASONAL PF 04/20/2013, 03/14/2018, 03/01/2019   Influenza-Unspecified 04/20/2013, 04/20/2013   PFIZER(Purple Top)SARS-COV-2 Vaccination 12/12/2019, 01/03/2020   Pfizer Covid-19  Vaccine Bivalent Booster 79yrs & up 06/01/2021   Pneumococcal Conjugate-13 03/21/2009, 01/19/2015   Pneumococcal Polysaccharide-23 01/28/2021   Tdap 01/28/2021   Zoster Recombinant(Shingrix) 07/24/2020, 10/24/2020    Past Medical History:  Diagnosis Date   Diabetes mellitus    High cholesterol    Hypertension     Tobacco History: Social History   Tobacco Use  Smoking Status Former   Current packs/day: 0.00   Types: Cigarettes   Quit date: 03/30/1980   Years since quitting: 43.8   Passive exposure: Never  Smokeless Tobacco Current   Types: Chew, Snuff   Ready to quit: Not Answered Counseling given: Not Answered   Outpatient Medications Prior to Visit  Medication Sig Dispense Refill   amLODipine  (NORVASC ) 10 MG tablet Take 1 tablet (10 mg total) by mouth daily. 30 tablet 2   aspirin  EC 81 MG tablet Take 81 mg by mouth daily. Swallow whole.     atorvastatin  (LIPITOR) 40 MG tablet Take 40 mg by mouth daily. (Patient not taking: Reported on 01/12/2024)     blood glucose meter kit and supplies Dispense based on patient and insurance preference. Use up to four times daily as directed. (FOR ICD-10 E10.9, E11.9). 1 each 0   Cholecalciferol (VITAMIN D-3) 25 MCG (1000 UT) CAPS Take 1 capsule by mouth every morning.     empagliflozin  (JARDIANCE ) 10 MG TABS tablet Take 1 tablet (10 mg total) by mouth daily before breakfast. 90 tablet 3   ezetimibe  (ZETIA ) 10 MG tablet Take 1 tablet (10 mg total) by mouth daily. 90 tablet 3   Insulin  NPH, Human,, Isophane, (HUMULIN  N KWIKPEN) 100 UNIT/ML Kiwkpen Inject 33 Units into the skin in the morning and at bedtime. 60 mL 1   Insulin  Pen Needle (DROPLET PEN NEEDLES) 31G X 8 MM MISC 1 each by Other route 2 (two) times daily with a meal. 200 each 0   levothyroxine  (SYNTHROID ) 50 MCG tablet Take 1 tablet (50 mcg total) by mouth daily. 90 tablet 0   metFORMIN  (GLUCOPHAGE ) 1000 MG tablet Take 1 tablet (1,000 mg total) by mouth 2 (two) times daily with a  meal. 180 tablet 0   nitroGLYCERIN  (NITROSTAT ) 0.4 MG SL tablet Place 1 tablet (0.4 mg total) under the tongue every 5 (five) minutes as needed for chest pain. 25 tablet 3   spironolactone  (ALDACTONE ) 25 MG tablet Take 1 tablet (25 mg total) by mouth daily. 90 tablet 3   torsemide  (DEMADEX ) 20 MG tablet Take 1 tablet (20 mg total) by mouth daily. 30 tablet 0   vitamin B-12 (CYANOCOBALAMIN ) 500 MCG tablet Take 500 mcg by mouth daily.     No facility-administered medications prior to visit.   Review of Systems  Review of Systems  HENT:  Positive for congestion.   Psychiatric/Behavioral:  Positive for sleep disturbance.      Physical Exam  There were no vitals taken for this visit. Physical Exam Constitutional:      Appearance: Normal appearance.  HENT:     Head: Normocephalic and atraumatic.  Cardiovascular:     Rate and Rhythm: Normal rate and regular rhythm.  Pulmonary:     Effort: Pulmonary effort is normal.     Breath sounds: Normal breath sounds.  Skin:  General: Skin is warm and dry.  Neurological:     General: No focal deficit present.     Mental Status: He is oriented to person, place, and time. Mental status is at baseline.  Psychiatric:        Mood and Affect: Mood normal.        Behavior: Behavior normal.        Thought Content: Thought content normal.        Judgment: Judgment normal.     Lab Results:  CBC    Component Value Date/Time   WBC 8.4 12/19/2023 0337   RBC 5.06 12/19/2023 0337   HGB 14.6 12/19/2023 0337   HGB 16.0 06/09/2023 1140   HCT 47.5 12/19/2023 0337   HCT 49.6 06/09/2023 1140   PLT 193 12/19/2023 0337   PLT 178 06/09/2023 1140   MCV 93.9 12/19/2023 0337   MCV 92 06/09/2023 1140   MCH 28.9 12/19/2023 0337   MCHC 30.7 12/19/2023 0337   RDW 13.1 12/19/2023 0337   RDW 12.2 06/09/2023 1140   LYMPHSABS 0.6 (L) 12/09/2023 0242   LYMPHSABS 1.7 06/09/2023 1140   MONOABS 1.3 (H) 12/09/2023 0242   EOSABS 0.0 12/09/2023 0242   EOSABS  0.2 06/09/2023 1140   BASOSABS 0.0 12/09/2023 0242   BASOSABS 0.0 06/09/2023 1140    BMET    Component Value Date/Time   NA 140 12/29/2023 1235   NA 137 12/25/2023 0928   K 4.4 12/29/2023 1235   CL 95 (L) 12/29/2023 1235   CO2 34 (H) 12/29/2023 1235   GLUCOSE 63 (L) 12/29/2023 1235   BUN 86 (H) 12/29/2023 1235   BUN 100 (HH) 12/25/2023 0928   CREATININE 1.61 (H) 12/29/2023 1235   CALCIUM  9.8 12/29/2023 1235   GFRNONAA 44 (L) 12/29/2023 1235   GFRAA >60 03/18/2018 0320    BNP    Component Value Date/Time   BNP 55.7 12/29/2023 1235    ProBNP No results found for: PROBNP  Imaging: No results found.   Assessment & Plan:   1. OSA treated with BiPAP (Primary)  2. Pleural plaque  3. Chronic obstructive asthma (with obstructive pulmonary disease) (HCC)  4. Chronic respiratory failure with hypoxia and hypercapnia (HCC)    Assessment and Plan Assessment & Plan Obstructive sleep apnea on BiPAP and supplemental oxygen Obstructive sleep apnea is well controlled with BiPAP therapy. He is using a new BiPAP machine with pressure settings of 11/7 cm H2O. Apnea-hypopnea index is less than 0.5 events per hour, indicating excellent control. He is also using supplemental oxygen since hospitalization and reports sleeping well. - Continue BiPAP therapy with current settings of 11/7 cm H2O. - Continue supplemental oxygen use with BiPAP.  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 No evidence of neoplasm, cancer, or fibrosis. Some scarring is present but similar to prior imaging. - Monitor for worsening symptoms such as shortness of breath not relieved with albuterol , blood in mucus, or worsening cough.  Chronic obstructive asthma Chronic cough with sputum production is present. Albuterol  is prescribed for use as needed for wheezing or shortness of breath. Sputum is not  discolored and contains no blood. Albuterol  is used intermittently, sometimes not needed for weeks, but occasionally required daily for a few days. - Refill albuterol  inhaler with two refills. - Instruct to use two puffs every six hours as needed for wheezing or shortness of breath. - Advise to contact the clinic  if albuterol  is needed every day or if symptoms worsen.  Recording duration: 5 minutes   Almarie LELON Ferrari, NP 01/18/2024

## 2024-01-19 ENCOUNTER — Encounter (HOSPITAL_BASED_OUTPATIENT_CLINIC_OR_DEPARTMENT_OTHER): Payer: Self-pay | Admitting: Primary Care

## 2024-01-19 ENCOUNTER — Ambulatory Visit (HOSPITAL_BASED_OUTPATIENT_CLINIC_OR_DEPARTMENT_OTHER): Admitting: Primary Care

## 2024-01-19 VITALS — BP 123/64 | HR 76 | Ht 72.0 in | Wt 170.0 lb

## 2024-01-19 DIAGNOSIS — K8020 Calculus of gallbladder without cholecystitis without obstruction: Secondary | ICD-10-CM | POA: Diagnosis not present

## 2024-01-19 DIAGNOSIS — I152 Hypertension secondary to endocrine disorders: Secondary | ICD-10-CM | POA: Diagnosis not present

## 2024-01-19 DIAGNOSIS — Z87891 Personal history of nicotine dependence: Secondary | ICD-10-CM

## 2024-01-19 DIAGNOSIS — J9611 Chronic respiratory failure with hypoxia: Secondary | ICD-10-CM | POA: Diagnosis not present

## 2024-01-19 DIAGNOSIS — I082 Rheumatic disorders of both aortic and tricuspid valves: Secondary | ICD-10-CM | POA: Diagnosis not present

## 2024-01-19 DIAGNOSIS — J920 Pleural plaque with presence of asbestos: Secondary | ICD-10-CM | POA: Diagnosis not present

## 2024-01-19 DIAGNOSIS — Z950 Presence of cardiac pacemaker: Secondary | ICD-10-CM | POA: Diagnosis not present

## 2024-01-19 DIAGNOSIS — I5033 Acute on chronic diastolic (congestive) heart failure: Secondary | ICD-10-CM | POA: Diagnosis not present

## 2024-01-19 DIAGNOSIS — G4733 Obstructive sleep apnea (adult) (pediatric): Secondary | ICD-10-CM

## 2024-01-19 DIAGNOSIS — I50813 Acute on chronic right heart failure: Secondary | ICD-10-CM | POA: Diagnosis not present

## 2024-01-19 DIAGNOSIS — L03115 Cellulitis of right lower limb: Secondary | ICD-10-CM | POA: Diagnosis not present

## 2024-01-19 DIAGNOSIS — I272 Pulmonary hypertension, unspecified: Secondary | ICD-10-CM | POA: Diagnosis not present

## 2024-01-19 DIAGNOSIS — I442 Atrioventricular block, complete: Secondary | ICD-10-CM | POA: Diagnosis not present

## 2024-01-19 DIAGNOSIS — E1122 Type 2 diabetes mellitus with diabetic chronic kidney disease: Secondary | ICD-10-CM | POA: Diagnosis not present

## 2024-01-19 DIAGNOSIS — E1159 Type 2 diabetes mellitus with other circulatory complications: Secondary | ICD-10-CM | POA: Diagnosis not present

## 2024-01-19 DIAGNOSIS — J9612 Chronic respiratory failure with hypercapnia: Secondary | ICD-10-CM

## 2024-01-19 DIAGNOSIS — E43 Unspecified severe protein-calorie malnutrition: Secondary | ICD-10-CM | POA: Diagnosis not present

## 2024-01-19 DIAGNOSIS — I509 Heart failure, unspecified: Secondary | ICD-10-CM | POA: Diagnosis not present

## 2024-01-19 DIAGNOSIS — A419 Sepsis, unspecified organism: Secondary | ICD-10-CM | POA: Diagnosis not present

## 2024-01-19 DIAGNOSIS — J449 Chronic obstructive pulmonary disease, unspecified: Secondary | ICD-10-CM | POA: Diagnosis not present

## 2024-01-19 DIAGNOSIS — E7800 Pure hypercholesterolemia, unspecified: Secondary | ICD-10-CM | POA: Diagnosis not present

## 2024-01-19 DIAGNOSIS — J45998 Other asthma: Secondary | ICD-10-CM | POA: Diagnosis not present

## 2024-01-19 DIAGNOSIS — I2510 Atherosclerotic heart disease of native coronary artery without angina pectoris: Secondary | ICD-10-CM | POA: Diagnosis not present

## 2024-01-19 DIAGNOSIS — N1831 Chronic kidney disease, stage 3a: Secondary | ICD-10-CM | POA: Diagnosis not present

## 2024-01-19 DIAGNOSIS — E039 Hypothyroidism, unspecified: Secondary | ICD-10-CM | POA: Diagnosis not present

## 2024-01-19 DIAGNOSIS — N179 Acute kidney failure, unspecified: Secondary | ICD-10-CM | POA: Diagnosis not present

## 2024-01-19 DIAGNOSIS — R1312 Dysphagia, oropharyngeal phase: Secondary | ICD-10-CM | POA: Diagnosis not present

## 2024-01-19 DIAGNOSIS — J929 Pleural plaque without asbestos: Secondary | ICD-10-CM

## 2024-01-19 DIAGNOSIS — G929 Unspecified toxic encephalopathy: Secondary | ICD-10-CM | POA: Diagnosis not present

## 2024-01-19 DIAGNOSIS — E1165 Type 2 diabetes mellitus with hyperglycemia: Secondary | ICD-10-CM | POA: Diagnosis not present

## 2024-01-19 DIAGNOSIS — J4489 Other specified chronic obstructive pulmonary disease: Secondary | ICD-10-CM

## 2024-01-19 MED ORDER — ALBUTEROL SULFATE HFA 108 (90 BASE) MCG/ACT IN AERS: 2.0000 | INHALATION_SPRAY | Freq: Four times a day (QID) | RESPIRATORY_TRACT | 2 refills | Status: DC | PRN

## 2024-01-19 NOTE — Patient Instructions (Addendum)
   YOUR PLAN: -OBSTRUCTIVE SLEEP APNEA: Obstructive sleep apnea is a condition where your breathing stops and starts repeatedly during sleep. Your sleep apnea is well controlled with your BiPAP machine set at 11/7 cm H2O, and you are also using supplemental oxygen. Please continue using your BiPAP machine with the current settings and continue the supplemental oxygen.  -ASBESTOS-RELATED PLEURAL DISEASE: Asbestos-related pleural disease is a condition caused by exposure to asbestos, leading to scarring and plaques in the lining of your lungs. Your condition is stable with no signs of cancer or worsening fibrosis. Please monitor for any worsening symptoms such as shortness of breath not relieved with albuterol , blood in your mucus, or a worsening cough.  -CHRONIC COUGH WITH SPUTUM PRODUCTION: Chronic cough with sputum production means you have a persistent cough that produces mucus. You are using albuterol  as needed for wheezing or shortness of breath. Please continue using your albuterol  inhaler as prescribed, with two puffs every six hours as needed. Contact the clinic if you need to use albuterol  every day or if your symptoms worsen.  INSTRUCTIONS: Please continue with your current BiPAP settings and supplemental oxygen. Monitor for any worsening symptoms related to your pleural disease. Use your albuterol  inhaler as needed and contact the clinic if your symptoms worsen or if you need to use albuterol  daily.  Follow-up 6 months with Dr. Alva or Landry NP

## 2024-01-20 ENCOUNTER — Telehealth: Payer: Self-pay | Admitting: *Deleted

## 2024-01-20 ENCOUNTER — Telehealth (HOSPITAL_COMMUNITY): Payer: Self-pay

## 2024-01-20 ENCOUNTER — Encounter: Payer: Self-pay | Admitting: *Deleted

## 2024-01-20 NOTE — Telephone Encounter (Signed)
 Called to confirm/remind patient of their appointment [facility] on 01/21/24.   Appointment:   [x] Confirmed  [] Left mess   [] No answer/No voice mail  [] VM Full/unable to leave message  [] Phone not in service  Patient reminded to bring all medications and/or complete list.  Confirmed patient has transportation. Gave directions, instructed to utilize valet parking.

## 2024-01-20 NOTE — Progress Notes (Signed)
 Advanced Heart Failure Clinic Note   PCP: Lorren Greig PARAS, NP Cardiologist: Lurena MARLA Red, MD  HF Cardiologist: Dr. Cherrie  HPI: 78 y.o. male with history of CAD, chronic HFpEF with RV failure, DM, HTN, HLD, OSA and chronic respiratory failure on BiPAP, CHB s/p dual chamber PPM with LBBAP lead in 01/25, hx asbestos exposure with pleural plaque on prior HRCT but no ILD, remote tobacco use.    Appears to be evidence of RV dysfunction on echo dating back to 2019. L/RHC in 02/2018: Nonobstructive CAD involving RCA, PCWP mean 8, PA 46/16 (26), Fick CO/CI 4.45/2.07, TD CO/CI 3.64/1.69.    Admitted for acute decompensated HFpEF and RV failure 12/07/23. Echo showed EF 60-65%, interventricular septum is flattened, RV enlarged and HK, RVSP 58 mmHg, moderate TR. RHC showed RA 16, mPA 34, PCWP 11, TD CO/CI 3.8/1.8, PVR 6.0. Required ICU stay for BiPAP and inotrope/pressor support.   Seen by PCP 12/25/23. Noted to by hypotensive 74/38, asymptomatic. BUN >100. Referred to nutrition, ENT for dysphagia, and nephrology. Followed by [facility]. Has home heath PT.   Today he returns for AHF follow up. Overall feeling ***. Denies palpitations, CP, dizziness, edema, or PND/Orthopnea. *** SOB. Appetite ok. No fever or chills. Weight at home *** pounds. Taking all medications. Denies ETOH, tobacco or drug use.    Past Medical History:  Diagnosis Date   Diabetes mellitus    High cholesterol    Hypertension     Current Outpatient Medications  Medication Sig Dispense Refill   albuterol  (VENTOLIN  HFA) 108 (90 Base) MCG/ACT inhaler Inhale 2 puffs into the lungs every 6 (six) hours as needed for wheezing or shortness of breath. 8 g 2   amLODipine  (NORVASC ) 10 MG tablet Take 1 tablet (10 mg total) by mouth daily. 30 tablet 2   aspirin  EC 81 MG tablet Take 81 mg by mouth daily. Swallow whole.     atorvastatin  (LIPITOR) 40 MG tablet Take 40 mg by mouth daily.     blood glucose meter kit and  supplies Dispense based on patient and insurance preference. Use up to four times daily as directed. (FOR ICD-10 E10.9, E11.9). 1 each 0   Cholecalciferol (VITAMIN D-3) 25 MCG (1000 UT) CAPS Take 1 capsule by mouth every morning.     empagliflozin  (JARDIANCE ) 10 MG TABS tablet Take 1 tablet (10 mg total) by mouth daily before breakfast. 90 tablet 3   ezetimibe  (ZETIA ) 10 MG tablet Take 1 tablet (10 mg total) by mouth daily. 90 tablet 3   Insulin  NPH, Human,, Isophane, (HUMULIN  N KWIKPEN) 100 UNIT/ML Kiwkpen Inject 33 Units into the skin in the morning and at bedtime. 60 mL 1   Insulin  Pen Needle (DROPLET PEN NEEDLES) 31G X 8 MM MISC 1 each by Other route 2 (two) times daily with a meal. 200 each 0   levothyroxine  (SYNTHROID ) 50 MCG tablet Take 1 tablet (50 mcg total) by mouth daily. 90 tablet 0   metFORMIN  (GLUCOPHAGE ) 1000 MG tablet Take 1 tablet (1,000 mg total) by mouth 2 (two) times daily with a meal. 180 tablet 0   nitroGLYCERIN  (NITROSTAT ) 0.4 MG SL tablet Place 1 tablet (0.4 mg total) under the tongue every 5 (five) minutes as needed for chest pain. 25 tablet 3   spironolactone  (ALDACTONE ) 25 MG tablet Take 1 tablet (25 mg total) by mouth daily. 90 tablet 3   torsemide  (DEMADEX ) 20 MG tablet Take 1 tablet (20 mg total) by mouth daily. 30 tablet  0   vitamin B-12 (CYANOCOBALAMIN ) 500 MCG tablet Take 500 mcg by mouth daily.     No current facility-administered medications for this visit.    Not on File    Social History   Socioeconomic History   Marital status: Married    Spouse name: Not on file   Number of children: Not on file   Years of education: Not on file   Highest education level: Not on file  Occupational History   Not on file  Tobacco Use   Smoking status: Former    Current packs/day: 0.00    Types: Cigarettes    Quit date: 03/30/1980    Years since quitting: 43.8    Passive exposure: Never   Smokeless tobacco: Current    Types: Chew, Snuff  Vaping Use   Vaping  status: Never Used  Substance and Sexual Activity   Alcohol use: No   Drug use: No   Sexual activity: Not Currently  Other Topics Concern   Not on file  Social History Narrative   Not on file   Social Drivers of Health   Financial Resource Strain: Low Risk  (08/06/2023)   Overall Financial Resource Strain (CARDIA)    Difficulty of Paying Living Expenses: Not hard at all  Food Insecurity: No Food Insecurity (12/21/2023)   Hunger Vital Sign    Worried About Running Out of Food in the Last Year: Never true    Ran Out of Food in the Last Year: Never true  Transportation Needs: No Transportation Needs (12/21/2023)   PRAPARE - Administrator, Civil Service (Medical): No    Lack of Transportation (Non-Medical): No  Physical Activity: Sufficiently Active (08/06/2023)   Exercise Vital Sign    Days of Exercise per Week: 7 days    Minutes of Exercise per Session: 30 min  Stress: No Stress Concern Present (08/06/2023)   [employer] of Occupational Health - Occupational Stress Questionnaire    Feeling of Stress : Not at all  Social Connections: Moderately Isolated (12/08/2023)   Social Connection and Isolation Panel    Frequency of Communication with Friends and Family: More than three times a week    Frequency of Social Gatherings with Friends and Family: More than three times a week    Attends Religious Services: Never    Database administrator or Organizations: No    Attends Banker Meetings: Never    Marital Status: Married  Catering manager Violence: Not At Risk (12/21/2023)   Humiliation, Afraid, Rape, and Kick questionnaire    Fear of Current or Ex-Partner: No    Emotionally Abused: No    Physically Abused: No    Sexually Abused: No    Family History  Problem Relation Age of Onset   Heart failure Mother    Cancer Father    Diabetes Sister    Cancer Sister    There were no vitals filed for this visit.   There were no vitals filed for this  visit.   PHYSICAL EXAM: General:  *** appearing.  No respiratory difficulty Neck: JVD *** cm.  Cor: Regular rate & rhythm. No murmurs. Lungs: clear Extremities: no edema  Neuro: alert & oriented x 3. Affect pleasant.   Wt Readings from Last 3 Encounters:  01/19/24 77.1 kg (170 lb)  12/29/23 75.9 kg (167 lb 6.4 oz)  12/25/23 75.8 kg (167 lb)     ECG (personally reviewed from 12/29/23): Asensed Vpaced 77 bpm  St.  Jude Device Interrogation: AF/AT burden <1%, VP 96%, no CorVue  ReDs reading: attempted, unsuccessful ***  ASSESSMENT & PLAN: End-Stage Chronic HFpEF / PAH / Cor Pulmonale - Evidence of RV dysfunction on echo dating back to 2019.  - L/RHC in 10/19: Nonobstructive CAD involving RCA, PCW 8, PA 46/16 (26), Fick CO/CI 4.45/2.07, TD CO/CI 3.64/1.69.  - PFTs in 2020 with mixed restrictive and obstructive defect. CT w/o evidence of ILD. - Echo 7/25: EF 60-65%, interventricular septum is flattened, RV enlarged and HK, RVSP 58 mmHg, mod TR - RHC 07/25: RA 16, PA mean 34, PCWP 11, TD CO/CI 3.8/1.8, unable to read Fick, PVR 6.0, PAPi 1.4 - Given severe CO2 retention suspect most WHO Group 3 disease but may have component of WHO Group 1.  - Reports adherence with home BiPAP - NYHA III. Volume difficult to assess, appears close to euvolemic. Weight up 2 lbs since admission.  - stop diamox  250 mg daily, likely needs high bicarb to compensate for retained CO2 - hypotensive: stop lisinopril  and chlorthalidone  - continue torsemide  20 mg daily. BMET/BNP today - continue jardiance  10 mg daily - increase spiro to 25 mg daily - Low-output/end stage PAH/cor pulmonale  2. AKI on CKD IIIa - sCr baseline 1.3-1.4. - sCr up to 1.88 and BUN >100 at PCP appointment 12/25/23. Also noted to be hypotensive - stop anti-hypertensive meds as above - repeat labs today   3. CHB s/p dual chamber PPM w/ LBBAP lead 4. Atrial Flutter - Device interrogated by [facility]. Jude rep. Several very brief runs of atrial  flutter noted, just seconds in duration.  - Off amiodarone  with pulmonary disease   5. 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 Failure to Thrive - Followed by [facility]  - Has home heath PT - Dysphagia. Referred to ENT by PCP - Referred to Nutritional Therapy by PCP   Follow up in 2 weeks with APP (repeat BMET, assess volume)  Beckey LITTIE Coe, NP 01/20/24

## 2024-01-21 ENCOUNTER — Ambulatory Visit (HOSPITAL_COMMUNITY): Payer: Self-pay | Admitting: Internal Medicine

## 2024-01-21 ENCOUNTER — Ambulatory Visit (HOSPITAL_COMMUNITY)
Admission: RE | Admit: 2024-01-21 | Discharge: 2024-01-21 | Disposition: A | Discharge status: Home / Self Care | Source: Ambulatory Visit | Attending: Internal Medicine | Admitting: Internal Medicine

## 2024-01-21 ENCOUNTER — Encounter (HOSPITAL_COMMUNITY): Payer: Self-pay

## 2024-01-21 ENCOUNTER — Telehealth: Payer: Self-pay | Admitting: *Deleted

## 2024-01-21 VITALS — BP 120/62 | HR 90 | Ht 73.0 in | Wt 174.4 lb

## 2024-01-21 DIAGNOSIS — J9610 Chronic respiratory failure, unspecified whether with hypoxia or hypercapnia: Secondary | ICD-10-CM | POA: Diagnosis not present

## 2024-01-21 DIAGNOSIS — Z794 Long term (current) use of insulin: Secondary | ICD-10-CM | POA: Diagnosis not present

## 2024-01-21 DIAGNOSIS — I442 Atrioventricular block, complete: Secondary | ICD-10-CM

## 2024-01-21 DIAGNOSIS — Z87891 Personal history of nicotine dependence: Secondary | ICD-10-CM | POA: Insufficient documentation

## 2024-01-21 DIAGNOSIS — N1831 Chronic kidney disease, stage 3a: Secondary | ICD-10-CM | POA: Diagnosis not present

## 2024-01-21 DIAGNOSIS — E7800 Pure hypercholesterolemia, unspecified: Secondary | ICD-10-CM | POA: Insufficient documentation

## 2024-01-21 DIAGNOSIS — E1122 Type 2 diabetes mellitus with diabetic chronic kidney disease: Secondary | ICD-10-CM | POA: Diagnosis not present

## 2024-01-21 DIAGNOSIS — L989 Disorder of the skin and subcutaneous tissue, unspecified: Secondary | ICD-10-CM | POA: Insufficient documentation

## 2024-01-21 DIAGNOSIS — Z7984 Long term (current) use of oral hypoglycemic drugs: Secondary | ICD-10-CM | POA: Diagnosis not present

## 2024-01-21 DIAGNOSIS — R627 Adult failure to thrive: Secondary | ICD-10-CM | POA: Diagnosis not present

## 2024-01-21 DIAGNOSIS — Z79899 Other long term (current) drug therapy: Secondary | ICD-10-CM | POA: Diagnosis not present

## 2024-01-21 DIAGNOSIS — I5032 Chronic diastolic (congestive) heart failure: Secondary | ICD-10-CM

## 2024-01-21 DIAGNOSIS — I2510 Atherosclerotic heart disease of native coronary artery without angina pectoris: Secondary | ICD-10-CM | POA: Insufficient documentation

## 2024-01-21 DIAGNOSIS — E8729 Other acidosis: Secondary | ICD-10-CM | POA: Diagnosis not present

## 2024-01-21 DIAGNOSIS — I120 Hypertensive chronic kidney disease with stage 5 chronic kidney disease or end stage renal disease: Secondary | ICD-10-CM | POA: Insufficient documentation

## 2024-01-21 DIAGNOSIS — I4892 Unspecified atrial flutter: Secondary | ICD-10-CM | POA: Insufficient documentation

## 2024-01-21 DIAGNOSIS — J984 Other disorders of lung: Secondary | ICD-10-CM | POA: Insufficient documentation

## 2024-01-21 DIAGNOSIS — R1310 Dysphagia, unspecified: Secondary | ICD-10-CM | POA: Insufficient documentation

## 2024-01-21 DIAGNOSIS — I2781 Cor pulmonale (chronic): Secondary | ICD-10-CM | POA: Diagnosis not present

## 2024-01-21 DIAGNOSIS — I272 Pulmonary hypertension, unspecified: Secondary | ICD-10-CM | POA: Diagnosis not present

## 2024-01-21 DIAGNOSIS — I5084 End stage heart failure: Secondary | ICD-10-CM

## 2024-01-21 DIAGNOSIS — G4733 Obstructive sleep apnea (adult) (pediatric): Secondary | ICD-10-CM | POA: Insufficient documentation

## 2024-01-21 LAB — BASIC METABOLIC PANEL WITH GFR
Anion gap: 11 (ref 5–15)
BUN: 33 mg/dL — ABNORMAL HIGH (ref 8–23)
CO2: 34 mmol/L — ABNORMAL HIGH (ref 22–32)
Calcium: 10.6 mg/dL — ABNORMAL HIGH (ref 8.9–10.3)
Chloride: 96 mmol/L — ABNORMAL LOW (ref 98–111)
Creatinine, Ser: 1.11 mg/dL (ref 0.61–1.24)
GFR, Estimated: >60 mL/min (ref >60)
Glucose, Bld: 93 mg/dL (ref 70–99)
Potassium: 5.4 mmol/L — ABNORMAL HIGH (ref 3.5–5.1)
Sodium: 141 mmol/L (ref 135–145)

## 2024-01-21 LAB — BRAIN NATRIURETIC PEPTIDE: B Natriuretic Peptide: 96.8 pg/mL (ref 0.0–100.0)

## 2024-01-21 MED ORDER — TORSEMIDE 20 MG PO TABS: 20.0000 mg | ORAL_TABLET | Freq: Every day | ORAL | 1 refills | Status: DC

## 2024-01-21 MED ORDER — SPIRONOLACTONE 25 MG PO TABS: 12.5000 mg | ORAL_TABLET | Freq: Every day | ORAL | Status: DC

## 2024-01-21 NOTE — Patient Instructions (Signed)
 Visit Information  Thank you for taking time to visit with me today. Please don't hesitate to contact me if I can be of assistance to you before our next scheduled telephone appointment.   Following is a copy of your care plan:   Goals Addressed             This Visit's Progress    COMPLETED: VBCI Transitions of Care (TOC) Care Plan       Problems:  Recent Hospitalization for treatment of CHF Equipment/DME barrier patient requesting a walker and Knowledge Deficit Related to HF  Goal:  Over the next 30 days, the patient will not experience hospital readmission  Interventions:  Transitions of Care: Doctor Visits  - discussed the importance of doctor visits Discussed referral to Longitudinal case management-patient declined at this time Discussed patient having HH PT twice weekly Discussed closing TOC 30-day Program    Heart Failure Interventions: Basic overview and discussion of pathophysiology of Heart Failure reviewed Provided education on low sodium diet Advised patient to weigh each morning after emptying bladder Discussed importance of daily weight and advised patient to weigh and record daily Discussed the importance of keeping all appointments with provider Reviewed provider notes from HF Clinic today Discussed medication changes-patient aware and understands Advised patient to check BP daily, record and take to provider visits   Patient Self Care Activities:  Attend all scheduled provider appointments Call pharmacy for medication refills 3-7 days in advance of running out of medications Participate in Transition of Care Program/Attend TOC scheduled calls Take medications as prescribed   call office if I gain more than 2 pounds in one day or 5 pounds in one week use salt in moderation weigh myself daily  Plan:  No further follow up required:          The patient verbalized understanding of instructions, educational materials, and care plan provided today  and agreed to receive a mailed copy of patient instructions, educational materials, and care plan.   No further follow up required:    Please call the care guide team at [phone] if you need to cancel or reschedule your appointment.   Please call [phone]-TALK (toll free, 24 hour hotline) go to [facility]gent [facility] [address], [place] [phone]) call 911 if you are experiencing a Mental Health or Behavioral Health Crisis or need someone to talk to.  Andrea Dimes RN, BSN [place]  [facility] RN Care Manager [phone]

## 2024-01-21 NOTE — Transitions of Care (Post Inpatient/ED Visit) (Signed)
 Transition of Care week 4  Visit Note  01/21/2024  Name: Robert Ramirez MRN: [id number]          DOB: 01-10-1946  Situation: Patient enrolled in [facility] 30-day program. Visit completed with [name] by telephone.   Background:   Initial Transition Care Management Follow-up Telephone Call    Past Medical History:  Diagnosis Date   Diabetes mellitus    High cholesterol    Hypertension     Assessment: Patient Reported Symptoms: Cognitive Cognitive Status: Able to follow simple commands, Alert and oriented to person, place, and time, Normal speech and language skills      Neurological Neurological Review of Symptoms: No symptoms reported    HEENT HEENT Symptoms Reported: No symptoms reported      Cardiovascular Cardiovascular Symptoms Reported: No symptoms reported Cardiovascular Management Strategies: Routine screening, Medication therapy, Adequate rest, Diet modification Cardiovascular Self-Management Outcome: 3 (uncertain) Cardiovascular Comment: Follow up with Cardiology today. Discussed d/c of lisinopril  and Chlorthalidone . Patient has removed these medications from his medication organizer.  Respiratory Respiratory Symptoms Reported: No symptoms reported    Endocrine Endocrine Symptoms Reported: No symptoms reported    Gastrointestinal Gastrointestinal Symptoms Reported: Constipation Additional Gastrointestinal Details: LBM 01/19/24 Gastrointestinal Management Strategies: Medication therapy, Coping strategies, Adequate rest Gastrointestinal Self-Management Outcome: 3 (uncertain) Gastrointestinal Comment: Continues to take Miralax  everyother day    Genitourinary Genitourinary Symptoms Reported: No symptoms reported    Integumentary Integumentary Symptoms Reported: No symptoms reported Skin Self-Management Outcome: 4 (good)  Musculoskeletal Musculoskelatal Symptoms Reviewed: Limited mobility Musculoskeletal Self-Management Outcome: 4 (good) Musculoskeletal Comment:  Working with PT twice weekly, walking independently in the house. Carries a cane when going outside.      Psychosocial Psychosocial Symptoms Reported: Not assessed         There were no vitals filed for this visit.  Medications Reviewed Today     Reviewed by Lucky Andrea LABOR, RN (Registered Nurse) on 01/21/24 at 1428  Med List Status: <None>   Medication Order Taking? Sig Documenting Provider Last Dose Status Informant  albuterol  (VENTOLIN  HFA) 108 (90 Base) MCG/ACT inhaler [id number] Yes Inhale 2 puffs into the lungs every 6 (six) hours as needed for wheezing or shortness of breath. Hope Almarie ORN, NP  Active   amLODipine  (NORVASC ) 10 MG tablet [id number] Yes Take 1 tablet (10 mg total) by mouth daily. Lorren Greig PARAS, NP  Active Self, Pharmacy Records  aspirin  EC 81 MG tablet [id number] Yes Take 81 mg by mouth daily. Swallow whole. [provider]  Active Self, Pharmacy Records  atorvastatin  (LIPITOR) 40 MG tablet [id number]  Take 40 mg by mouth daily.  Patient not taking: Reported on 01/21/2024   [provider]  Active Self, Pharmacy Records  blood glucose meter kit and supplies [id number] Yes Dispense based on patient and insurance preference. Use up to four times daily as directed. (FOR ICD-10 E10.9, E11.9). Lorren Greig PARAS, NP  Active Self, Pharmacy Records  Cholecalciferol (VITAMIN D-3) 25 MCG (1000 UT) CAPS [id number] Yes Take 1 capsule by mouth every morning. [provider]  Active Self, Pharmacy Records  empagliflozin  (JARDIANCE ) 10 MG TABS tablet [id number] Yes Take 1 tablet (10 mg total) by mouth daily before breakfast. Thukkani, Arun K, MD  Active Self, Pharmacy Records  ezetimibe  (ZETIA ) 10 MG tablet [id number] Yes Take 1 tablet (10 mg total) by mouth daily. Thukkani, Arun K, MD  Active Self, Pharmacy Records  Insulin  NPH, Human,, Isophane, (HUMULIN  N [facility]) 100 UNIT/ML Alphonsus [id number] Yes Inject  33 Units into the skin in the morning and at bedtime.   Patient taking differently: Inject 33 Units into the skin in the morning and at bedtime. Does not take if blood sugar is low   Delbert Clam, MD  Active Self, Pharmacy Records  Insulin  Pen Needle (DROPLET PEN NEEDLES) 31G X 8 MM MISC [id number] Yes 1 each by Other route 2 (two) times daily with a meal. Lorren Greig PARAS, NP  Active Self, Pharmacy Records  levothyroxine  (SYNTHROID ) 50 MCG tablet [id number] Yes Take 1 tablet (50 mcg total) by mouth daily. Lorren Greig PARAS, NP  Active   metFORMIN  (GLUCOPHAGE ) 1000 MG tablet [id number] Yes Take 1 tablet (1,000 mg total) by mouth 2 (two) times daily with a meal. Lorren Greig PARAS, NP  Active   nitroGLYCERIN  (NITROSTAT ) 0.4 MG SL tablet [id number] Yes Place 1 tablet (0.4 mg total) under the tongue every 5 (five) minutes as needed for chest pain. Thukkani, Arun K, MD  Active Self, Pharmacy Records  spironolactone  (ALDACTONE ) 25 MG tablet [id number] Yes Take 1 tablet (25 mg total) by mouth daily. Lee, [place], NP  Active   torsemide  (DEMADEX ) 20 MG tablet [id number] Yes Take 1 tablet (20 mg total) by mouth daily. Hayes Beckey CROME, NP  Active   vitamin B-12 (CYANOCOBALAMIN ) 500 MCG tablet [id number] Yes Take 500 mcg by mouth daily. [provider]  Active Self, Pharmacy Records            Recommendation:   Referral to Longitudinal Case Management-patient declined  Follow Up Plan:   Closing From:  Transitions of Care Program  Andrea Dimes RN, BSN [place]  Value[facility]Based [facility] Institute [facility] RN Care Manager [phone]

## 2024-01-21 NOTE — Patient Instructions (Addendum)
 Thank you for coming in today  If you had labs drawn today, any labs that are abnormal the clinic will call you No news is good news  Please wear your compression hose daily, place them on as soon as you get up in the morning and remove before you go to bed at night.   Medications: Take extra Torsemide  20 mg today 01/21/2024 take 40 mg 01/22/2024 then 01/23/2024 go back to 20 mg daily   Follow up appointments:  Your physician recommends that you schedule a follow-up appointment in:  4-6 weeks   Do the following things EVERYDAY: Weigh yourself in the morning before breakfast. Write it down and keep it in a log. Take your medicines as prescribed Eat low salt foods--Limit salt (sodium) to 2000 mg per day.  Stay as active as you can everyday Limit all fluids for the day to less than 2 liters   At the Advanced [facility] Failure Clinic, you and your health needs are our priority. As part of our continuing mission to provide you with exceptional heart care, we have created designated Provider Care Teams. These Care Teams include your primary Cardiologist (physician) and Advanced Practice Providers (APPs- Physician Assistants and Nurse Practitioners) who all work together to provide you with the care you need, when you need it.   You may see any of the following providers on your designated Care Team at your next follow up: Dr Toribio Fuel Dr Ezra Shuck Dr. Ria Gardenia Greig Lenetta, NP Caffie Shed, [place] [facility] [place], [place] Beckey Coe, NP Tinnie Redman, PharmD   Please be sure to bring in all your medications bottles to every appointment.    Thank you for choosing [facility]  If you have any questions or concerns before your next appointment please send us  a message through [place] or call our office at [phone].    TO LEAVE A MESSAGE FOR THE NURSE SELECT OPTION 2, PLEASE LEAVE A MESSAGE INCLUDING: YOUR NAME DATE  OF BIRTH CALL BACK NUMBER REASON FOR CALL**this is important as we prioritize the call backs  YOU WILL RECEIVE A CALL BACK THE SAME DAY AS LONG AS YOU CALL BEFORE 4:00 PM .

## 2024-01-26 DIAGNOSIS — I082 Rheumatic disorders of both aortic and tricuspid valves: Secondary | ICD-10-CM | POA: Diagnosis not present

## 2024-01-26 DIAGNOSIS — E7800 Pure hypercholesterolemia, unspecified: Secondary | ICD-10-CM | POA: Diagnosis not present

## 2024-01-26 DIAGNOSIS — E1159 Type 2 diabetes mellitus with other circulatory complications: Secondary | ICD-10-CM | POA: Diagnosis not present

## 2024-01-26 DIAGNOSIS — A419 Sepsis, unspecified organism: Secondary | ICD-10-CM | POA: Diagnosis not present

## 2024-01-26 DIAGNOSIS — R1312 Dysphagia, oropharyngeal phase: Secondary | ICD-10-CM | POA: Diagnosis not present

## 2024-01-26 DIAGNOSIS — I442 Atrioventricular block, complete: Secondary | ICD-10-CM | POA: Diagnosis not present

## 2024-01-26 DIAGNOSIS — Z87891 Personal history of nicotine dependence: Secondary | ICD-10-CM | POA: Diagnosis not present

## 2024-01-26 DIAGNOSIS — Z950 Presence of cardiac pacemaker: Secondary | ICD-10-CM | POA: Diagnosis not present

## 2024-01-26 DIAGNOSIS — I272 Pulmonary hypertension, unspecified: Secondary | ICD-10-CM | POA: Diagnosis not present

## 2024-01-26 DIAGNOSIS — I152 Hypertension secondary to endocrine disorders: Secondary | ICD-10-CM | POA: Diagnosis not present

## 2024-01-26 DIAGNOSIS — K8020 Calculus of gallbladder without cholecystitis without obstruction: Secondary | ICD-10-CM | POA: Diagnosis not present

## 2024-01-26 DIAGNOSIS — N1831 Chronic kidney disease, stage 3a: Secondary | ICD-10-CM | POA: Diagnosis not present

## 2024-01-26 DIAGNOSIS — I50813 Acute on chronic right heart failure: Secondary | ICD-10-CM | POA: Diagnosis not present

## 2024-01-26 DIAGNOSIS — N179 Acute kidney failure, unspecified: Secondary | ICD-10-CM | POA: Diagnosis not present

## 2024-01-26 DIAGNOSIS — I2510 Atherosclerotic heart disease of native coronary artery without angina pectoris: Secondary | ICD-10-CM | POA: Diagnosis not present

## 2024-01-26 DIAGNOSIS — I5033 Acute on chronic diastolic (congestive) heart failure: Secondary | ICD-10-CM | POA: Diagnosis not present

## 2024-01-26 DIAGNOSIS — J449 Chronic obstructive pulmonary disease, unspecified: Secondary | ICD-10-CM | POA: Diagnosis not present

## 2024-01-26 DIAGNOSIS — L03115 Cellulitis of right lower limb: Secondary | ICD-10-CM | POA: Diagnosis not present

## 2024-01-26 DIAGNOSIS — E1165 Type 2 diabetes mellitus with hyperglycemia: Secondary | ICD-10-CM | POA: Diagnosis not present

## 2024-01-26 DIAGNOSIS — E43 Unspecified severe protein-calorie malnutrition: Secondary | ICD-10-CM | POA: Diagnosis not present

## 2024-01-26 DIAGNOSIS — E1122 Type 2 diabetes mellitus with diabetic chronic kidney disease: Secondary | ICD-10-CM | POA: Diagnosis not present

## 2024-01-26 DIAGNOSIS — J9612 Chronic respiratory failure with hypercapnia: Secondary | ICD-10-CM | POA: Diagnosis not present

## 2024-01-26 DIAGNOSIS — E039 Hypothyroidism, unspecified: Secondary | ICD-10-CM | POA: Diagnosis not present

## 2024-01-26 DIAGNOSIS — G929 Unspecified toxic encephalopathy: Secondary | ICD-10-CM | POA: Diagnosis not present

## 2024-01-26 DIAGNOSIS — G4733 Obstructive sleep apnea (adult) (pediatric): Secondary | ICD-10-CM | POA: Diagnosis not present

## 2024-01-28 ENCOUNTER — Ambulatory Visit (HOSPITAL_COMMUNITY)
Admission: RE | Admit: 2024-01-28 | Discharge: 2024-01-28 | Disposition: A | Discharge status: Home / Self Care | Source: Ambulatory Visit | Attending: Cardiology | Admitting: Cardiology

## 2024-01-28 ENCOUNTER — Ambulatory Visit (HOSPITAL_COMMUNITY): Payer: Self-pay | Admitting: Cardiology

## 2024-01-28 ENCOUNTER — Encounter: Payer: Self-pay | Admitting: Internal Medicine

## 2024-01-28 DIAGNOSIS — I5032 Chronic diastolic (congestive) heart failure: Secondary | ICD-10-CM | POA: Diagnosis not present

## 2024-01-28 LAB — BASIC METABOLIC PANEL WITH GFR
Anion gap: 11 (ref 5–15)
BUN: 38 mg/dL — ABNORMAL HIGH (ref 8–23)
CO2: 31 mmol/L (ref 22–32)
Calcium: 10 mg/dL (ref 8.9–10.3)
Chloride: 96 mmol/L — ABNORMAL LOW (ref 98–111)
Creatinine, Ser: 1.06 mg/dL (ref 0.61–1.24)
GFR, Estimated: >60 mL/min (ref >60)
Glucose, Bld: 165 mg/dL — ABNORMAL HIGH (ref 70–99)
Potassium: 4.5 mmol/L (ref 3.5–5.1)
Sodium: 138 mmol/L (ref 135–145)

## 2024-02-04 ENCOUNTER — Ambulatory Visit: Payer: Self-pay

## 2024-02-04 ENCOUNTER — Ambulatory Visit (INDEPENDENT_AMBULATORY_CARE_PROVIDER_SITE_OTHER): Admitting: Podiatry

## 2024-02-04 ENCOUNTER — Ambulatory Visit (INDEPENDENT_AMBULATORY_CARE_PROVIDER_SITE_OTHER)

## 2024-02-04 ENCOUNTER — Other Ambulatory Visit: Payer: Self-pay | Admitting: Podiatry

## 2024-02-04 VITALS — BP 145/73 | HR 86 | Temp 98.1°F | Resp 18 | Ht 73.0 in | Wt 174.4 lb

## 2024-02-04 DIAGNOSIS — A419 Sepsis, unspecified organism: Secondary | ICD-10-CM | POA: Diagnosis not present

## 2024-02-04 DIAGNOSIS — E1159 Type 2 diabetes mellitus with other circulatory complications: Secondary | ICD-10-CM | POA: Diagnosis not present

## 2024-02-04 DIAGNOSIS — I2510 Atherosclerotic heart disease of native coronary artery without angina pectoris: Secondary | ICD-10-CM | POA: Diagnosis not present

## 2024-02-04 DIAGNOSIS — E039 Hypothyroidism, unspecified: Secondary | ICD-10-CM | POA: Diagnosis not present

## 2024-02-04 DIAGNOSIS — M86471 Chronic osteomyelitis with draining sinus, right ankle and foot: Secondary | ICD-10-CM

## 2024-02-04 DIAGNOSIS — L03115 Cellulitis of right lower limb: Secondary | ICD-10-CM | POA: Diagnosis not present

## 2024-02-04 DIAGNOSIS — N1831 Chronic kidney disease, stage 3a: Secondary | ICD-10-CM | POA: Diagnosis not present

## 2024-02-04 DIAGNOSIS — G929 Unspecified toxic encephalopathy: Secondary | ICD-10-CM | POA: Diagnosis not present

## 2024-02-04 DIAGNOSIS — L97513 Non-pressure chronic ulcer of other part of right foot with necrosis of muscle: Secondary | ICD-10-CM | POA: Diagnosis not present

## 2024-02-04 DIAGNOSIS — Z87891 Personal history of nicotine dependence: Secondary | ICD-10-CM | POA: Diagnosis not present

## 2024-02-04 DIAGNOSIS — E1165 Type 2 diabetes mellitus with hyperglycemia: Secondary | ICD-10-CM | POA: Diagnosis not present

## 2024-02-04 DIAGNOSIS — E08621 Diabetes mellitus due to underlying condition with foot ulcer: Secondary | ICD-10-CM

## 2024-02-04 DIAGNOSIS — Z950 Presence of cardiac pacemaker: Secondary | ICD-10-CM | POA: Diagnosis not present

## 2024-02-04 DIAGNOSIS — I5033 Acute on chronic diastolic (congestive) heart failure: Secondary | ICD-10-CM | POA: Diagnosis not present

## 2024-02-04 DIAGNOSIS — E7800 Pure hypercholesterolemia, unspecified: Secondary | ICD-10-CM | POA: Diagnosis not present

## 2024-02-04 DIAGNOSIS — L97412 Non-pressure chronic ulcer of right heel and midfoot with fat layer exposed: Secondary | ICD-10-CM

## 2024-02-04 DIAGNOSIS — I082 Rheumatic disorders of both aortic and tricuspid valves: Secondary | ICD-10-CM | POA: Diagnosis not present

## 2024-02-04 DIAGNOSIS — K8020 Calculus of gallbladder without cholecystitis without obstruction: Secondary | ICD-10-CM | POA: Diagnosis not present

## 2024-02-04 DIAGNOSIS — J9612 Chronic respiratory failure with hypercapnia: Secondary | ICD-10-CM | POA: Diagnosis not present

## 2024-02-04 DIAGNOSIS — I442 Atrioventricular block, complete: Secondary | ICD-10-CM | POA: Diagnosis not present

## 2024-02-04 DIAGNOSIS — I50813 Acute on chronic right heart failure: Secondary | ICD-10-CM | POA: Diagnosis not present

## 2024-02-04 DIAGNOSIS — I739 Peripheral vascular disease, unspecified: Secondary | ICD-10-CM

## 2024-02-04 DIAGNOSIS — I272 Pulmonary hypertension, unspecified: Secondary | ICD-10-CM | POA: Diagnosis not present

## 2024-02-04 DIAGNOSIS — R1312 Dysphagia, oropharyngeal phase: Secondary | ICD-10-CM | POA: Diagnosis not present

## 2024-02-04 DIAGNOSIS — E1122 Type 2 diabetes mellitus with diabetic chronic kidney disease: Secondary | ICD-10-CM | POA: Diagnosis not present

## 2024-02-04 DIAGNOSIS — I152 Hypertension secondary to endocrine disorders: Secondary | ICD-10-CM | POA: Diagnosis not present

## 2024-02-04 DIAGNOSIS — E43 Unspecified severe protein-calorie malnutrition: Secondary | ICD-10-CM | POA: Diagnosis not present

## 2024-02-04 DIAGNOSIS — J449 Chronic obstructive pulmonary disease, unspecified: Secondary | ICD-10-CM | POA: Diagnosis not present

## 2024-02-04 DIAGNOSIS — N179 Acute kidney failure, unspecified: Secondary | ICD-10-CM | POA: Diagnosis not present

## 2024-02-04 DIAGNOSIS — G4733 Obstructive sleep apnea (adult) (pediatric): Secondary | ICD-10-CM | POA: Diagnosis not present

## 2024-02-04 MED ORDER — GENTAMICIN SULFATE 0.1 % EX OINT: 1.0000 | TOPICAL_OINTMENT | Freq: Every day | CUTANEOUS | 0 refills | Status: AC

## 2024-02-04 MED ORDER — AMOXICILLIN-POT CLAVULANATE 875-125 MG PO TABS: 1.0000 | ORAL_TABLET | Freq: Two times a day (BID) | ORAL | 0 refills | Status: DC

## 2024-02-04 NOTE — Patient Instructions (Signed)
 Call to schedule your vascular testing:   [facility] Robert Ramirez. [facility] [address] [place],  [place]  [address]   Main: [phone]   Call [facility] to schedule your MRI at the below locations.  Please allow at least 1 business day after your visit to process the referral.  It may take longer depending on approval from insurance.  Please let me know if you have issues or problems scheduling the MRI   [facility] [place] [phone] [address] [place] [address] [place], [place] [address]  [facility] [phone] [address] [place], [place] [address]

## 2024-02-04 NOTE — Telephone Encounter (Signed)
 FYI Only or Action Required?: Action required by provider: request for appointment.  Patient was last seen in primary care on 12/25/2023 by Lorren Greig PARAS, NP.  Called Nurse Triage reporting Leg Swelling.  Symptoms began several months ago.  Interventions attempted: Nothing.  Symptoms are: gradually worsening.  Triage Disposition: See HCP Within 4 Hours (Or PCP Triage)  Patient/caregiver understands and will follow disposition?: No, refuses disposition        Copied from CRM [id number]. Topic: Clinical - Red Word Triage >> Feb 04, 2024  9:46 AM Myrick T wrote: [facility] that prompted transfer to Nurse Triage: patients daughter in law called stated patients legs are swollen, hand peeling and there is a wound on the bottom of right foot is black in color. Patient stated the wound is painful to walk on. Reason for Disposition  SEVERE leg swelling (e.g., swelling extends above knee, entire leg is swollen, weeping fluid)  Answer Assessment - Initial Assessment Questions Spoke with Reena patient's daughter in law regarding symptoms. Hand peeling on both hands. States the skin looks brittle and stiff and flaky. Wound on bottom of R foot has pus coming from it that is clear. Skin around the area is black. Patient has had occasional dizziness. Patient is able to get around but area is painful. This RN offered an appointment in office today but family refused. Requesting an appointment in office on Monday. Will route to office for follow up.    1. ONSET: When did the swelling start? (e.g., minutes, hours, days)     After hospital visit in July.  2. LOCATION: What part of the leg is swollen?  Are both legs swollen or just one leg?     Both  3. SEVERITY: How bad is the swelling? (e.g., localized; mild, moderate, severe)     Moderate  4. REDNESS: Is there redness or signs of infection?     Redness  5. PAIN: Is the swelling painful to touch? If Yes, ask: How painful is it?    (Scale 1-10; mild, moderate or severe)     Patient states symptoms are bothersome and it is hard to walk  6. FEVER: Do you have a fever? If Yes, ask: What is it, how was it measured, and when did it start?      Denies  10. OTHER SYMPTOMS: Do you have any other symptoms? (e.g., chest pain, difficulty breathing)       Denies  chest pain, difficulty breathing.  Protocols used: Leg Swelling and Edema-A-AH

## 2024-02-05 NOTE — Progress Notes (Signed)
 Subjective:  Patient ID: Robert Ramirez, male    DOB: 1946/04/20,  MRN: [id number]  Chief Complaint  Patient presents with   Wound Check    Rm 2 Pt is here for ulcer on the lateral side of the right foot. Pt states pain and soreness from wound over the past week.  Patient is diabetic.       78 y.o. male presents with the above complaint. History confirmed with patient. History of type 2 diabetes cardiomyopathy previous pacemaker placement in January of this year.  Was recently hospitalized for catheterization.  After leaving the hospital his wife was hospitalized and recently passed away on 02/20/24.  His son-in-law noticed a large foul-smelling scab on the outside of his right foot.  Recent A1c 8.9% on 12/25/2023  Objective:  Physical Exam: Right foot has nonpalpable pulses with a large necrotic patch ulceration with foul-smelling drainage on the lateral fifth metatarsal head some delayed sluggish capillary fill time and erythema in the periwound    Radiographs: Multiple views x-ray of the right foot: early developing erosion on the lateral cortex of the fifth metatarsal head Assessment:   1. Chronic osteomyelitis of right foot with draining sinus (HCC)   2. Diabetic ulcer of other part of right foot associated with diabetes mellitus due to underlying condition, with necrosis of muscle (HCC)   3. PAD (peripheral artery disease) (HCC)      Plan:  Patient was evaluated and treated and all questions answered.  Ulcer right foot -We discussed the etiology and factors that are a part of the wound healing process.  We also discussed the risk of infection both soft tissue and osteomyelitis from open ulceration.  Discussed the risk of limb loss if this happens or worsens. -Debridement as below. -Dressed with Iodosorb, DSD. -Continue home dressing changes daily with foam border dressing and gentamicin  ointment.  Rx sent to pharmacy for this -Continue off-loading with surgical shoe. This was  dispensed today -Vascular testing ABIs ordered he has nonpalpable right foot pulses and likely a contributor to his nonhealing wound -HgbA1c:8.9% we discussed importance of allowing his A1c in adequate wound healing -Last antibiotics: culture taken and Rx for Augmentin  sent to pharmacy -Imaging: x-ray reviewed, shows possible erosion or infection. Will order MRI to further evaluate.  Procedure: Excisional Debridement of Wound Tool: Sharp #312 chisel blade/tissue nipper Type of Debridement: Sharp Excisional Frequency: weekly or biweekly until appropriately healed.  Dressing is to be changed daily/keeping the wound clean and dry Rationale: Removal of non-viable soft tissue from the wound to promote healing.  Anesthesia: none Pre-Debridement Wound Measurements: 1.2 cm x 1.2 cm x 0.3 cm  Post-Debridement Wound Measurements: 1.5 cm x 1.5 cm x 0.5 cm  Area devitalized tissue removed(nonviable tissue only): 0.2 cm x 0.2 cm.  Blood loss: Minimal (<50cc) Hemostasis: manual pressure Depth of Debridement: with muscle necrosis and fascial necrosis Description of tissue removed: Necrotic and Devitalized Tissue Technique: The wound and the surrounding skin were prepped in usual aseptic fashion.  Aseptic technique was maintained throughout the procedure.  Using #312 blade/tissue nipper sharp debridement of necrotic/nonviable tissue was performed until healthy bleeding wound bed was achieved.  No underlying bone or tendon was exposed during debridement.  The wound was thoroughly irrigated with normal saline solution Wound Progress:  Current Wound Volume: Debridement was performed of the chronic nonhealing diabetic foot wound on right foot lateral fifthMetatarsal head.  Debridement removed 0.2 cm x 0.20.3 cm of the necrotic tissue and subcutaneous tissue and  moderate amount of purulent drainage was present. Presence/absence of tissue: Necrotic tissue/nonviable tissue present at the base of the wound.  Sharp  debridement was performed to remove the necrotic tissue/nonviable tissue back to viable tissue.  No devitalized/nonviable tissue present postdebridement.  Wound appeared clean and clear of infection No material in the wound was present that was identified to be inhibiting healing. Dressing: Dry, sterile, dressing with Iodosorb on ulcer base followed by dry sterile dressings with 4 x 4 Kerlix and tape. Disposition: Patient tolerated procedure well. Patient to return for ongoing wound care as noted below.   Return in about 1 week (around 02/11/2024) for wound care.

## 2024-02-05 NOTE — Telephone Encounter (Signed)
 Report to Emergency Department/Urgent Care/call 911 for immediate medical evaluation.

## 2024-02-05 NOTE — Telephone Encounter (Signed)
 I spoke with patient and patient went to the foot and wound clinic yesterday and they are taking care of it for  him

## 2024-02-07 LAB — WOUND CULTURE
MICRO NUMBER:: 16955254
SPECIMEN QUALITY:: ADEQUATE

## 2024-02-07 LAB — HOUSE ACCOUNT TRACKING

## 2024-02-08 ENCOUNTER — Ambulatory Visit (HOSPITAL_COMMUNITY)
Admission: RE | Admit: 2024-02-08 | Discharge: 2024-02-08 | Disposition: A | Discharge status: Home / Self Care | Source: Ambulatory Visit | Attending: Podiatry | Admitting: Podiatry

## 2024-02-08 DIAGNOSIS — E785 Hyperlipidemia, unspecified: Secondary | ICD-10-CM | POA: Insufficient documentation

## 2024-02-08 DIAGNOSIS — L97513 Non-pressure chronic ulcer of other part of right foot with necrosis of muscle: Secondary | ICD-10-CM | POA: Insufficient documentation

## 2024-02-08 DIAGNOSIS — E11622 Type 2 diabetes mellitus with other skin ulcer: Secondary | ICD-10-CM | POA: Diagnosis not present

## 2024-02-08 DIAGNOSIS — I10 Essential (primary) hypertension: Secondary | ICD-10-CM | POA: Diagnosis not present

## 2024-02-08 DIAGNOSIS — E08621 Diabetes mellitus due to underlying condition with foot ulcer: Secondary | ICD-10-CM | POA: Insufficient documentation

## 2024-02-08 LAB — VAS US ABI WITH/WO TBI
Left ABI: 0.99
Right ABI: 0.89

## 2024-02-09 DIAGNOSIS — Z87891 Personal history of nicotine dependence: Secondary | ICD-10-CM | POA: Diagnosis not present

## 2024-02-09 DIAGNOSIS — L03115 Cellulitis of right lower limb: Secondary | ICD-10-CM | POA: Diagnosis not present

## 2024-02-09 DIAGNOSIS — E1159 Type 2 diabetes mellitus with other circulatory complications: Secondary | ICD-10-CM | POA: Diagnosis not present

## 2024-02-09 DIAGNOSIS — E43 Unspecified severe protein-calorie malnutrition: Secondary | ICD-10-CM | POA: Diagnosis not present

## 2024-02-09 DIAGNOSIS — J9612 Chronic respiratory failure with hypercapnia: Secondary | ICD-10-CM | POA: Diagnosis not present

## 2024-02-09 DIAGNOSIS — K8020 Calculus of gallbladder without cholecystitis without obstruction: Secondary | ICD-10-CM | POA: Diagnosis not present

## 2024-02-09 DIAGNOSIS — I50813 Acute on chronic right heart failure: Secondary | ICD-10-CM | POA: Diagnosis not present

## 2024-02-09 DIAGNOSIS — J449 Chronic obstructive pulmonary disease, unspecified: Secondary | ICD-10-CM | POA: Diagnosis not present

## 2024-02-09 DIAGNOSIS — I5033 Acute on chronic diastolic (congestive) heart failure: Secondary | ICD-10-CM | POA: Diagnosis not present

## 2024-02-09 DIAGNOSIS — G4733 Obstructive sleep apnea (adult) (pediatric): Secondary | ICD-10-CM | POA: Diagnosis not present

## 2024-02-09 DIAGNOSIS — R1312 Dysphagia, oropharyngeal phase: Secondary | ICD-10-CM | POA: Diagnosis not present

## 2024-02-09 DIAGNOSIS — N179 Acute kidney failure, unspecified: Secondary | ICD-10-CM | POA: Diagnosis not present

## 2024-02-09 DIAGNOSIS — E1122 Type 2 diabetes mellitus with diabetic chronic kidney disease: Secondary | ICD-10-CM | POA: Diagnosis not present

## 2024-02-09 DIAGNOSIS — I272 Pulmonary hypertension, unspecified: Secondary | ICD-10-CM | POA: Diagnosis not present

## 2024-02-09 DIAGNOSIS — E1165 Type 2 diabetes mellitus with hyperglycemia: Secondary | ICD-10-CM | POA: Diagnosis not present

## 2024-02-09 DIAGNOSIS — E7800 Pure hypercholesterolemia, unspecified: Secondary | ICD-10-CM | POA: Diagnosis not present

## 2024-02-09 DIAGNOSIS — E039 Hypothyroidism, unspecified: Secondary | ICD-10-CM | POA: Diagnosis not present

## 2024-02-09 DIAGNOSIS — I442 Atrioventricular block, complete: Secondary | ICD-10-CM | POA: Diagnosis not present

## 2024-02-09 DIAGNOSIS — A419 Sepsis, unspecified organism: Secondary | ICD-10-CM | POA: Diagnosis not present

## 2024-02-09 DIAGNOSIS — N1831 Chronic kidney disease, stage 3a: Secondary | ICD-10-CM | POA: Diagnosis not present

## 2024-02-09 DIAGNOSIS — Z950 Presence of cardiac pacemaker: Secondary | ICD-10-CM | POA: Diagnosis not present

## 2024-02-09 DIAGNOSIS — I152 Hypertension secondary to endocrine disorders: Secondary | ICD-10-CM | POA: Diagnosis not present

## 2024-02-09 DIAGNOSIS — I2510 Atherosclerotic heart disease of native coronary artery without angina pectoris: Secondary | ICD-10-CM | POA: Diagnosis not present

## 2024-02-09 DIAGNOSIS — G929 Unspecified toxic encephalopathy: Secondary | ICD-10-CM | POA: Diagnosis not present

## 2024-02-09 DIAGNOSIS — I082 Rheumatic disorders of both aortic and tricuspid valves: Secondary | ICD-10-CM | POA: Diagnosis not present

## 2024-02-11 ENCOUNTER — Ambulatory Visit (INDEPENDENT_AMBULATORY_CARE_PROVIDER_SITE_OTHER): Admitting: Podiatry

## 2024-02-11 VITALS — Ht 73.0 in | Wt 174.4 lb

## 2024-02-11 DIAGNOSIS — E08621 Diabetes mellitus due to underlying condition with foot ulcer: Secondary | ICD-10-CM | POA: Diagnosis not present

## 2024-02-11 DIAGNOSIS — M86471 Chronic osteomyelitis with draining sinus, right ankle and foot: Secondary | ICD-10-CM | POA: Diagnosis not present

## 2024-02-11 DIAGNOSIS — L97513 Non-pressure chronic ulcer of other part of right foot with necrosis of muscle: Secondary | ICD-10-CM | POA: Diagnosis not present

## 2024-02-11 DIAGNOSIS — I739 Peripheral vascular disease, unspecified: Secondary | ICD-10-CM

## 2024-02-14 NOTE — Progress Notes (Signed)
  Subjective:  Patient ID: Robert Ramirez, male    DOB: 14-Feb-1946,  MRN: [id number]  Chief Complaint  Patient presents with   Wound Check    RM 23 Return in about 1 week (around 02/11/2024) for wound care of ulcer on lateral side of right foot. Wound is open with moderate drainage.        78 y.o. male presents with the above complaint. History confirmed with patient.  He notes some improvement  Objective:  Physical Exam: Right foot has nonpalpable pulses with a full-thickness ulcer on the right lateral foot with fibrogranular wound bed surrounding hyperkeratosis no active signs of infection currently.    Radiographs: Multiple views x-ray of the right foot: early developing erosion on the lateral cortex of the fifth metatarsal head  ABI with toe pressure of 66 with dampened waveforms at the foot and ankle   Assessment:   1. Chronic osteomyelitis of right foot with draining sinus (HCC)   2. Diabetic ulcer of other part of right foot associated with diabetes mellitus due to underlying condition, with necrosis of muscle (HCC)   3. PAD (peripheral artery disease) (HCC)      Plan:  Patient was evaluated and treated and all questions answered.  Ulcer right foot - Has had some improvement.  He should continue to utilize the gentamicin  ointment and surgical shoe for offloading.  Daily wound care with changes every day with this.  I reviewed his ABIs with him he does have some dampened waveforms.  Suspect some component of them for tibial or small vessel disease.  Referral placed for vascular surgery.  MRI has been scheduled they cannot schedule until later this month due to his pacemaker at the hospital.  I will see him back in 3 weeks for reevaluation.     No follow-ups on file.

## 2024-02-16 ENCOUNTER — Encounter: Payer: Self-pay | Admitting: Vascular Surgery

## 2024-02-16 ENCOUNTER — Ambulatory Visit: Attending: Vascular Surgery | Admitting: Vascular Surgery

## 2024-02-16 VITALS — BP 119/54 | HR 86 | Temp 97.9°F | Resp 20 | Ht 73.0 in | Wt 174.0 lb

## 2024-02-16 DIAGNOSIS — I739 Peripheral vascular disease, unspecified: Secondary | ICD-10-CM | POA: Insufficient documentation

## 2024-02-16 NOTE — Progress Notes (Signed)
 Patient name: Robert Ramirez MRN: [id number] DOB: 04-12-1946 Sex: male  REASON FOR CONSULT: Right foot ulcer with PAD  HPI: Robert Ramirez is a 78 y.o. male, with history of hypertension, hyperlipidemia, diabetes that presents for evaluation of right foot ulcer with PAD.  Patient states the wound has been present for at least 3 weeks.  He is followed by podiatry.  Denies any prior revascularizations.  Denies tobacco abuse at this time.  Past Medical History:  Diagnosis Date   Diabetes mellitus    High cholesterol    Hypertension     Past Surgical History:  Procedure Laterality Date   APPENDECTOMY     ARTERIAL LINE INSERTION N/A 12/09/2023   Procedure: ARTERIAL LINE INSERTION;  Surgeon: Cherrie Toribio SAUNDERS, MD;  Location: [facility];  Service: Cardiovascular;  Laterality: N/A;   CENTRAL LINE INSERTION  12/09/2023   Procedure: CENTRAL LINE INSERTION;  Surgeon: Cherrie Toribio SAUNDERS, MD;  Location: [facility];  Service: Cardiovascular;;   PACEMAKER IMPLANT N/A 06/12/2023   Procedure: PACEMAKER IMPLANT;  Surgeon: Kennyth Chew, MD;  Location: [facility] INVASIVE CV [facility];  Service: Cardiovascular;  Laterality: N/A;   RIGHT HEART CATH N/A 12/09/2023   Procedure: RIGHT HEART CATH;  Surgeon: Cherrie Toribio SAUNDERS, MD;  Location: [facility];  Service: Cardiovascular;  Laterality: N/A;   RIGHT/LEFT HEART CATH AND CORONARY ANGIOGRAPHY N/A 03/17/2018   Procedure: RIGHT/LEFT HEART CATH AND CORONARY ANGIOGRAPHY;  Surgeon: Claudene Pacific, MD;  Location: [facility];  Service: Cardiovascular;  Laterality: N/A;    Family History  Problem Relation Age of Onset   Heart failure Mother    Cancer Father    Diabetes Sister    Cancer Sister     SOCIAL HISTORY: Social History   Socioeconomic History   Marital status: Married    Spouse name: Not on file   Number of children: Not on file   Years of education: Not on file   Highest education level: Not on file  Occupational  History   Not on file  Tobacco Use   Smoking status: Former    Current packs/day: 0.00    Types: Cigarettes    Quit date: 03/30/1980    Years since quitting: 43.9    Passive exposure: Never   Smokeless tobacco: Current    Types: Chew, Snuff  Vaping Use   Vaping status: Never Used  Substance and Sexual Activity   Alcohol use: No   Drug use: No   Sexual activity: Not Currently  Other Topics Concern   Not on file  Social History Narrative   Not on file   Social Drivers of Health   Financial Resource Strain: Low Risk  (08/06/2023)   Overall Financial Resource Strain (CARDIA)    Difficulty of Paying Living Expenses: Not hard at all  Food Insecurity: No Food Insecurity (12/21/2023)   Hunger Vital Sign    Worried About Running Out of Food in the Last Year: Never true    Ran Out of Food in the Last Year: Never true  Transportation Needs: No Transportation Needs (12/21/2023)   PRAPARE - Administrator, Civil Service (Medical): No    Lack of Transportation (Non-Medical): No  Physical Activity: Sufficiently Active (08/06/2023)   Exercise Vital Sign    Days of Exercise per Week: 7 days    Minutes of Exercise per Session: 30 min  Stress: No Stress Concern Present (08/06/2023)   [employer] of Occupational Health -  Occupational Stress Questionnaire    Feeling of Stress : Not at all  Social Connections: Moderately Isolated (12/08/2023)   Social Connection and Isolation Panel    Frequency of Communication with Friends and Family: More than three times a week    Frequency of Social Gatherings with Friends and Family: More than three times a week    Attends Religious Services: Never    Database administrator or Organizations: No    Attends Banker Meetings: Never    Marital Status: Married  Catering manager Violence: Not At Risk (12/21/2023)   Humiliation, Afraid, Rape, and Kick questionnaire    Fear of Current or Ex-Partner: No    Emotionally Abused: No     Physically Abused: No    Sexually Abused: No    Not on File  Current Outpatient Medications  Medication Sig Dispense Refill   albuterol  (VENTOLIN  HFA) 108 (90 Base) MCG/ACT inhaler Inhale 2 puffs into the lungs every 6 (six) hours as needed for wheezing or shortness of breath. 8 g 2   amLODipine  (NORVASC ) 10 MG tablet Take 1 tablet (10 mg total) by mouth daily. 30 tablet 2   amoxicillin -clavulanate (AUGMENTIN ) 875-125 MG tablet Take 1 tablet by mouth 2 (two) times daily. 20 tablet 0   aspirin  EC 81 MG tablet Take 81 mg by mouth daily. Swallow whole.     atorvastatin  (LIPITOR) 40 MG tablet Take 40 mg by mouth daily.     blood glucose meter kit and supplies Dispense based on patient and insurance preference. Use up to four times daily as directed. (FOR ICD-10 E10.9, E11.9). 1 each 0   Cholecalciferol (VITAMIN D-3) 25 MCG (1000 UT) CAPS Take 1 capsule by mouth every morning.     empagliflozin  (JARDIANCE ) 10 MG TABS tablet Take 1 tablet (10 mg total) by mouth daily before breakfast. 90 tablet 3   ezetimibe  (ZETIA ) 10 MG tablet Take 1 tablet (10 mg total) by mouth daily. 90 tablet 3   gentamicin  ointment (GARAMYCIN ) 0.1 % Apply 1 Application topically daily. Apply to wound daily 30 g 0   Insulin  NPH, Human,, Isophane, (HUMULIN  N KWIKPEN) 100 UNIT/ML Kiwkpen Inject 33 Units into the skin in the morning and at bedtime. (Patient taking differently: Inject 33 Units into the skin in the morning and at bedtime. Does not take if blood sugar is low) 60 mL 1   Insulin  Pen Needle (DROPLET PEN NEEDLES) 31G X 8 MM MISC 1 each by Other route 2 (two) times daily with a meal. 200 each 0   levothyroxine  (SYNTHROID ) 50 MCG tablet Take 1 tablet (50 mcg total) by mouth daily. 90 tablet 0   metFORMIN  (GLUCOPHAGE ) 1000 MG tablet Take 1 tablet (1,000 mg total) by mouth 2 (two) times daily with a meal. 180 tablet 0   nitroGLYCERIN  (NITROSTAT ) 0.4 MG SL tablet Place 1 tablet (0.4 mg total) under the tongue every 5 (five)  minutes as needed for chest pain. 25 tablet 3   spironolactone  (ALDACTONE ) 25 MG tablet Take 0.5 tablets (12.5 mg total) by mouth daily.     torsemide  (DEMADEX ) 20 MG tablet Take 1 tablet (20 mg total) by mouth daily. 90 tablet 1   vitamin B-12 (CYANOCOBALAMIN ) 500 MCG tablet Take 500 mcg by mouth daily.     No current facility-administered medications for this visit.    REVIEW OF SYSTEMS:  [X]  denotes positive finding, [ ]  denotes negative finding Cardiac  Comments:  Chest pain or chest pressure:  Shortness of breath upon exertion:    Short of breath when lying flat:    Irregular heart rhythm:        Vascular    Pain in calf, thigh, or hip brought on by ambulation:    Pain in feet at night that wakes you up from your sleep:     Blood clot in your veins:    Leg swelling:         Pulmonary    Oxygen at home:    Productive cough:     Wheezing:         Neurologic    Sudden weakness in arms or legs:     Sudden numbness in arms or legs:     Sudden onset of difficulty speaking or slurred speech:    Temporary loss of vision in one eye:     Problems with dizziness:         Gastrointestinal    Blood in stool:     Vomited blood:         Genitourinary    Burning when urinating:     Blood in urine:        Psychiatric    Major depression:         Hematologic    Bleeding problems:    Problems with blood clotting too easily:        Skin    Rashes or ulcers:        Constitutional    Fever or chills:      PHYSICAL EXAM: There were no vitals filed for this visit.  GENERAL: The patient is a well-nourished male, in no acute distress. The vital signs are documented above. CARDIAC: There is a regular rate and rhythm.  VASCULAR:  Bilateral common femoral pulses palpable Bilateral popliteal pulses palpable Right PT palpable Notable venous stasis changes with edema to the bilateral lower extremities PULMONARY: No respiratory distress. ABDOMEN: Soft and  non-tender. MUSCULOSKELETAL: There are no major deformities or cyanosis. NEUROLOGIC: No focal weakness or paresthesias are detected. PSYCHIATRIC: The patient has a normal affect.    DATA:   ABIs 02/08/2024 are 0.89 on the right multiphasic with a toe pressure of 66 and 0.99 on the left multiphasic with a toe pressure of 101  Assessment/Plan:  78 y.o. male, with history of hypertension, hyperlipidemia, diabetes that presents for evaluation of right foot ulcer with PAD.  Patient states the wound has been present for at least 3 weeks and is followed by podiatry.  He does have a palpable PT pulse at the right ankle.  I discussed that his toe pressures are marginal for wound healing on the right at 66 with a goal generally being 60-80.  I have offered him lower extremity angiogram with a focus on the right leg with possible intervention to ensure his inflow is optimized.  Ultimately he is against any invasive catheter angiography at this time.  I discussed the other option will be close interval surveillance with wound acre.   I discussed if the wound stalls or fails to improve he should reconsider angiogram at that time.  I will plan to see him for wound check in one week.   Lonni DOROTHA Gaskins, MD [facility] Office: [phone]

## 2024-02-17 DIAGNOSIS — N179 Acute kidney failure, unspecified: Secondary | ICD-10-CM | POA: Diagnosis not present

## 2024-02-17 DIAGNOSIS — I509 Heart failure, unspecified: Secondary | ICD-10-CM | POA: Diagnosis not present

## 2024-02-17 DIAGNOSIS — J449 Chronic obstructive pulmonary disease, unspecified: Secondary | ICD-10-CM | POA: Diagnosis not present

## 2024-02-17 DIAGNOSIS — N1832 Chronic kidney disease, stage 3b: Secondary | ICD-10-CM | POA: Diagnosis not present

## 2024-02-17 DIAGNOSIS — G4733 Obstructive sleep apnea (adult) (pediatric): Secondary | ICD-10-CM | POA: Diagnosis not present

## 2024-02-17 DIAGNOSIS — E1122 Type 2 diabetes mellitus with diabetic chronic kidney disease: Secondary | ICD-10-CM | POA: Diagnosis not present

## 2024-02-17 DIAGNOSIS — I129 Hypertensive chronic kidney disease with stage 1 through stage 4 chronic kidney disease, or unspecified chronic kidney disease: Secondary | ICD-10-CM | POA: Diagnosis not present

## 2024-02-18 LAB — LAB REPORT - SCANNED
Albumin, Urine POC: 25.1
Creatinine, POC: 23.7 mg/dL
EGFR: 62
Microalb Creat Ratio: 106

## 2024-02-19 DIAGNOSIS — I509 Heart failure, unspecified: Secondary | ICD-10-CM | POA: Diagnosis not present

## 2024-02-19 NOTE — Progress Notes (Signed)
 Advanced [facility] Failure Clinic Note   PCP: Lorren Greig PARAS, NP Cardiologist: Lurena MARLA Red, MD  HF Cardiologist: Dr. Cherrie  HPI: 78 y.o. male with history of CAD, chronic HFpEF with RV failure, DM, HTN, HLD, OSA and chronic respiratory failure on BiPAP, CHB s/p dual chamber PPM with LBBAP lead in 01/25, hx asbestos exposure with pleural plaque on prior HRCT but no ILD, remote tobacco use.    Appears to be evidence of RV dysfunction on echo dating back to 2019. L/RHC in 02/2018: Nonobstructive CAD involving RCA, PCWP mean 8, PA 46/16 (26), Fick CO/CI 4.45/2.07, TD CO/CI 3.64/1.69.    Admitted for acute decompensated HFpEF and RV failure 12/07/23. Echo showed EF 60-65%, interventricular septum is flattened, RV enlarged and HK, RVSP 58 mmHg, moderate TR. RHC showed RA 16, mPA 34, PCWP 11, TD CO/CI 3.8/1.8, PVR 6.0. Required ICU stay for BiPAP and inotrope/pressor support.   Seen by PCP 12/25/23. Noted to by hypotensive 74/38, asymptomatic. BUN >100. Referred to nutrition, ENT for dysphagia, and nephrology.   Today he returns for HF follow up. Overall feeling fine. He wears 2L oxygen, no SOB walking short distances on flat ground. Wear BiPap at night. Ankles and legs swell some. Balance is unsteady if he stands too fast. Denies palpitations, abnormal bleeding, CP, dizziness, or PND/Orthopnea. Appetite ok. Taking all medications. Sees podiatry for chronic osteomyelitis, step son helps with daily dressing changes. Saw [facility] and was offered lower extremity angiogram, however patient declined invasive treatments. Followed by palliative care.   Past Medical History:  Diagnosis Date   Diabetes mellitus    High cholesterol    Hypertension    Peripheral arterial disease    Current Outpatient Medications  Medication Sig Dispense Refill   albuterol  (VENTOLIN  HFA) 108 (90 Base) MCG/ACT inhaler Inhale 2 puffs into the lungs every 6 (six) hours as needed for wheezing or shortness of breath. 8 g 2    amLODipine  (NORVASC ) 10 MG tablet Take 1 tablet (10 mg total) by mouth daily. 30 tablet 2   aspirin  EC 81 MG tablet Take 81 mg by mouth daily. Swallow whole.     atorvastatin  (LIPITOR) 40 MG tablet Take 40 mg by mouth daily.     blood glucose meter kit and supplies Dispense based on patient and insurance preference. Use up to four times daily as directed. (FOR ICD-10 E10.9, E11.9). 1 each 0   Cholecalciferol (VITAMIN D-3) 25 MCG (1000 UT) CAPS Take 1 capsule by mouth every morning.     empagliflozin  (JARDIANCE ) 10 MG TABS tablet Take 1 tablet (10 mg total) by mouth daily before breakfast. 90 tablet 3   ezetimibe  (ZETIA ) 10 MG tablet Take 1 tablet (10 mg total) by mouth daily. 90 tablet 3   gentamicin  ointment (GARAMYCIN ) 0.1 % Apply 1 Application topically daily. Apply to wound daily 30 g 0   Insulin  NPH, Human,, Isophane, (HUMULIN  N KWIKPEN) 100 UNIT/ML Kiwkpen Inject 33 Units into the skin in the morning and at bedtime. 60 mL 1   Insulin  Pen Needle (DROPLET PEN NEEDLES) 31G X 8 MM MISC 1 each by Other route 2 (two) times daily with a meal. 200 each 0   levothyroxine  (SYNTHROID ) 50 MCG tablet Take 1 tablet (50 mcg total) by mouth daily. 90 tablet 0   metFORMIN  (GLUCOPHAGE ) 1000 MG tablet Take 1 tablet (1,000 mg total) by mouth 2 (two) times daily with a meal. 180 tablet 0   nitroGLYCERIN  (NITROSTAT ) 0.4 MG SL tablet Place 1  tablet (0.4 mg total) under the tongue every 5 (five) minutes as needed for chest pain. 25 tablet 3   spironolactone  (ALDACTONE ) 25 MG tablet Take 0.5 tablets (12.5 mg total) by mouth daily.     torsemide  (DEMADEX ) 20 MG tablet Take 1 tablet (20 mg total) by mouth daily. 90 tablet 1   vitamin B-12 (CYANOCOBALAMIN ) 500 MCG tablet Take 500 mcg by mouth daily.     amoxicillin -clavulanate (AUGMENTIN ) 875-125 MG tablet Take 1 tablet by mouth 2 (two) times daily. (Patient not taking: Reported on 02/24/2024) 20 tablet 0   No current facility-administered medications for this encounter.    No Known Allergies  Social History   Socioeconomic History   Marital status: Married    Spouse name: Not on file   Number of children: Not on file   Years of education: Not on file   Highest education level: Not on file  Occupational History   Not on file  Tobacco Use   Smoking status: Former    Current packs/day: 0.00    Types: Cigarettes    Quit date: 03/30/1980    Years since quitting: 43.9    Passive exposure: Never   Smokeless tobacco: Current    Types: Chew, Snuff  Vaping Use   Vaping status: Never Used  Substance and Sexual Activity   Alcohol use: No   Drug use: No   Sexual activity: Not Currently  Other Topics Concern   Not on file  Social History Narrative   Not on file   Social Drivers of Health   Financial Resource Strain: Low Risk  (08/06/2023)   Overall Financial Resource Strain (CARDIA)    Difficulty of Paying Living Expenses: Not hard at all  Food Insecurity: No Food Insecurity (12/21/2023)   Hunger Vital Sign    Worried About Running Out of Food in the Last Year: Never true    Ran Out of Food in the Last Year: Never true  Transportation Needs: No Transportation Needs (12/21/2023)   PRAPARE - Administrator, Civil Service (Medical): No    Lack of Transportation (Non-Medical): No  Physical Activity: Sufficiently Active (08/06/2023)   Exercise Vital Sign    Days of Exercise per Week: 7 days    Minutes of Exercise per Session: 30 min  Stress: No Stress Concern Present (08/06/2023)   [employer] of Occupational Health - Occupational Stress Questionnaire    Feeling of Stress : Not at all  Social Connections: Moderately Isolated (12/08/2023)   Social Connection and Isolation Panel    Frequency of Communication with Friends and Family: More than three times a week    Frequency of Social Gatherings with Friends and Family: More than three times a week    Attends Religious Services: Never    Database administrator or Organizations: No     Attends Banker Meetings: Never    Marital Status: Married  Catering manager Violence: Not At Risk (12/21/2023)   Humiliation, Afraid, Rape, and Kick questionnaire    Fear of Current or Ex-Partner: No    Emotionally Abused: No    Physically Abused: No    Sexually Abused: No    Family History  Problem Relation Age of Onset   Heart failure Mother    Cancer Father    Diabetes Sister    Cancer Sister    Wt Readings from Last 3 Encounters:  02/24/24 83.6 kg (184 lb 6.4 oz)  02/16/24 78.9 kg (174 lb)  02/11/24  79.1 kg (174 lb 6.4 oz)   BP 114/64   Pulse 89   Wt 83.6 kg (184 lb 6.4 oz)   SpO2 98% Comment: Patient is currently on 2-liters of room oxygen.  BMI 24.33 kg/m   PHYSICAL EXAM: General:  NAD. No resp difficulty, arrived in [facility], chronically-ill appearing HEENT: Normal Neck: Supple. No JVD. Cor: Regular rate & rhythm. No rubs, gallops or murmurs. Lungs: Clear, diminished in bases Abdomen: Soft, nontender, nondistended.  Extremities: No cyanosis, clubbing, rash, 1+ BLE edema Neuro: Alert & oriented x 3, moves all 4 extremities w/o difficulty. Affect pleasant.  StDentist (personally reviewed): AF/AT burden <1%, VP 99%, no CorVue   ASSESSMENT & PLAN: End-Stage Chronic HFpEF / PAH / Cor Pulmonale - Evidence of RV dysfunction on echo dating back to 2019.  - L/RHC in 10/19: Nonobstructive CAD involving RCA, PCW 8, PA 46/16 (26), Fick CO/CI 4.45/2.07, TD CO/CI 3.64/1.69.  - PFTs in 2020 with mixed restrictive and obstructive defect. CT w/o evidence of ILD. - Echo 7/25: EF 60-65%, interventricular septum is flattened, RV enlarged and HK, RVSP 58 mmHg, mod TR - RHC 07/25: RA 16, PA mean 34, PCWP 11, TD CO/CI 3.8/1.8, unable to read Fick, PVR 6.0, PAPi 1.4 - Given severe CO2 retention suspect most WHO Group 3 disease but may have component of WHO Group 1.  - Reports adherence with home BiPAP  - NYHA IIIb. Volume appears elevated on exam and weight  trending up.  - Increase torsemide  to 40 mg daily. - Continue Jardiance  10 mg daily. - Continue spiro 25 mg daily. - Off lisinopril  and chlorthalidone  with hypotension - Asked him to wear compression socks, he has them at home.  - End stage PAH/cor pulmonale - Recent labs reviewed. Repeat BMET in 10 days  2. CKD IIIa - Baseline SCr 1.3-1.4. - Continue SGLT2i - Labs from 02/17/24 reviewed, K 4.2, SCr 1.41 - Repeat labs as above   3. CHB  - s/p dual chamber PPM w/ LBBAP lead  4. Atrial Flutter - Device interrogation as above - Off amiodarone  with pulmonary disease   5. 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 PAD - chronic osteomyelitis to R foot - Follows with podiatry and [facility] - Continue statin + ASA  7. Failure to Thrive - Followed by [facility]  - Has home heath PT   Follow up in 4-6 weeks with APP  Harlene CHRISTELLA Gainer, FNP 02/24/24

## 2024-02-19 NOTE — CV Procedure (Signed)
  Device system confirmed to be MRI conditional, with implant date > 6 weeks ago, and no evidence of abandoned or epicardial leads in review of most recent CXR  Device last cleared by EP Provider: Charlies Arthur 02/18/24  Clearance is good through for 1 year as long as parameters remain stable at time of check. If pt undergoes a cardiac device procedure during that time, they should be re-cleared.   Tachy-therapies to be programmed off if applicable with device back to pre-MRI settings after completion of exam.  [employer] will be present for programming for the MRI.   Rocky Catalan, RT  02/19/2024 8:04 AM

## 2024-02-22 ENCOUNTER — Ambulatory Visit: Payer: Self-pay | Admitting: Family

## 2024-02-23 ENCOUNTER — Ambulatory Visit (HOSPITAL_COMMUNITY)
Admission: RE | Admit: 2024-02-23 | Discharge: 2024-02-23 | Disposition: A | Discharge status: Home / Self Care | Source: Ambulatory Visit | Attending: Podiatry | Admitting: Podiatry

## 2024-02-23 ENCOUNTER — Telehealth (HOSPITAL_COMMUNITY): Payer: Self-pay

## 2024-02-23 DIAGNOSIS — L97519 Non-pressure chronic ulcer of other part of right foot with unspecified severity: Secondary | ICD-10-CM | POA: Diagnosis not present

## 2024-02-23 DIAGNOSIS — E11621 Type 2 diabetes mellitus with foot ulcer: Secondary | ICD-10-CM | POA: Diagnosis not present

## 2024-02-23 DIAGNOSIS — L97513 Non-pressure chronic ulcer of other part of right foot with necrosis of muscle: Secondary | ICD-10-CM | POA: Diagnosis not present

## 2024-02-23 DIAGNOSIS — E08621 Diabetes mellitus due to underlying condition with foot ulcer: Secondary | ICD-10-CM | POA: Diagnosis not present

## 2024-02-23 DIAGNOSIS — R600 Localized edema: Secondary | ICD-10-CM | POA: Diagnosis not present

## 2024-02-23 DIAGNOSIS — M19071 Primary osteoarthritis, right ankle and foot: Secondary | ICD-10-CM | POA: Diagnosis not present

## 2024-02-23 NOTE — Progress Notes (Signed)
 noted

## 2024-02-23 NOTE — Telephone Encounter (Signed)
 Called to confirm/remind patient of their appointment at the Advanced [facility] Failure Clinic on 02/24/24.   Appointment:   [x] Confirmed  [] Left mess   [] No answer/No voice mail  [] VM Full/unable to leave message  [] Phone not in service  Patient reminded to bring all medications and/or complete list.  Confirmed patient has transportation. Gave directions, instructed to utilize valet parking.

## 2024-02-23 NOTE — Progress Notes (Signed)
 Patient was monitored by this RN during MRI scan due to presence of a pacemaker. Cardiac rhythm was continuously monitored throughout the procedure. Prior to the start of the scan, the pacemaker was placed in MRI-safe mode by the pacemaker representative. Following the completion of the scan, the device was returned to its pre-MRI settings. Neurological status and orientation post-procedure were unchanged from baseline.   Pre-procedure Heart Rate (Prior to being placed in MRI safe mode): 80  Intra-MRI: 100  Post-procedure Heart Rate (Once pacemaker is returned to baseline mode): 86

## 2024-02-24 ENCOUNTER — Other Ambulatory Visit: Payer: Self-pay | Admitting: Pharmacist

## 2024-02-24 ENCOUNTER — Encounter (HOSPITAL_COMMUNITY): Payer: Self-pay

## 2024-02-24 ENCOUNTER — Ambulatory Visit (HOSPITAL_COMMUNITY)
Admission: RE | Admit: 2024-02-24 | Discharge: 2024-02-24 | Disposition: A | Discharge status: Home / Self Care | Source: Ambulatory Visit | Attending: Family Medicine | Admitting: Family Medicine

## 2024-02-24 VITALS — BP 114/64 | HR 89 | Wt 184.4 lb

## 2024-02-24 DIAGNOSIS — N1831 Chronic kidney disease, stage 3a: Secondary | ICD-10-CM | POA: Diagnosis not present

## 2024-02-24 DIAGNOSIS — L97513 Non-pressure chronic ulcer of other part of right foot with necrosis of muscle: Secondary | ICD-10-CM

## 2024-02-24 DIAGNOSIS — E1122 Type 2 diabetes mellitus with diabetic chronic kidney disease: Secondary | ICD-10-CM | POA: Insufficient documentation

## 2024-02-24 DIAGNOSIS — I4892 Unspecified atrial flutter: Secondary | ICD-10-CM | POA: Diagnosis not present

## 2024-02-24 DIAGNOSIS — M86671 Other chronic osteomyelitis, right ankle and foot: Secondary | ICD-10-CM | POA: Insufficient documentation

## 2024-02-24 DIAGNOSIS — N186 End stage renal disease: Secondary | ICD-10-CM | POA: Diagnosis not present

## 2024-02-24 DIAGNOSIS — I2510 Atherosclerotic heart disease of native coronary artery without angina pectoris: Secondary | ICD-10-CM | POA: Diagnosis not present

## 2024-02-24 DIAGNOSIS — Z794 Long term (current) use of insulin: Secondary | ICD-10-CM | POA: Diagnosis not present

## 2024-02-24 DIAGNOSIS — L989 Disorder of the skin and subcutaneous tissue, unspecified: Secondary | ICD-10-CM | POA: Diagnosis not present

## 2024-02-24 DIAGNOSIS — Z7982 Long term (current) use of aspirin: Secondary | ICD-10-CM | POA: Insufficient documentation

## 2024-02-24 DIAGNOSIS — R627 Adult failure to thrive: Secondary | ICD-10-CM | POA: Insufficient documentation

## 2024-02-24 DIAGNOSIS — E1169 Type 2 diabetes mellitus with other specified complication: Secondary | ICD-10-CM | POA: Insufficient documentation

## 2024-02-24 DIAGNOSIS — J984 Other disorders of lung: Secondary | ICD-10-CM | POA: Insufficient documentation

## 2024-02-24 DIAGNOSIS — I739 Peripheral vascular disease, unspecified: Secondary | ICD-10-CM | POA: Insufficient documentation

## 2024-02-24 DIAGNOSIS — J9610 Chronic respiratory failure, unspecified whether with hypoxia or hypercapnia: Secondary | ICD-10-CM | POA: Diagnosis not present

## 2024-02-24 DIAGNOSIS — I5032 Chronic diastolic (congestive) heart failure: Secondary | ICD-10-CM | POA: Insufficient documentation

## 2024-02-24 DIAGNOSIS — I442 Atrioventricular block, complete: Secondary | ICD-10-CM | POA: Diagnosis not present

## 2024-02-24 DIAGNOSIS — Z9981 Dependence on supplemental oxygen: Secondary | ICD-10-CM | POA: Insufficient documentation

## 2024-02-24 DIAGNOSIS — E1151 Type 2 diabetes mellitus with diabetic peripheral angiopathy without gangrene: Secondary | ICD-10-CM | POA: Insufficient documentation

## 2024-02-24 DIAGNOSIS — E7800 Pure hypercholesterolemia, unspecified: Secondary | ICD-10-CM | POA: Insufficient documentation

## 2024-02-24 DIAGNOSIS — Z950 Presence of cardiac pacemaker: Secondary | ICD-10-CM

## 2024-02-24 DIAGNOSIS — I2781 Cor pulmonale (chronic): Secondary | ICD-10-CM | POA: Diagnosis not present

## 2024-02-24 DIAGNOSIS — Z7984 Long term (current) use of oral hypoglycemic drugs: Secondary | ICD-10-CM | POA: Insufficient documentation

## 2024-02-24 DIAGNOSIS — E08621 Diabetes mellitus due to underlying condition with foot ulcer: Secondary | ICD-10-CM

## 2024-02-24 DIAGNOSIS — F17220 Nicotine dependence, chewing tobacco, uncomplicated: Secondary | ICD-10-CM | POA: Insufficient documentation

## 2024-02-24 DIAGNOSIS — Z79899 Other long term (current) drug therapy: Secondary | ICD-10-CM | POA: Diagnosis not present

## 2024-02-24 DIAGNOSIS — G4733 Obstructive sleep apnea (adult) (pediatric): Secondary | ICD-10-CM | POA: Insufficient documentation

## 2024-02-24 DIAGNOSIS — I132 Hypertensive heart and chronic kidney disease with heart failure and with stage 5 chronic kidney disease, or end stage renal disease: Secondary | ICD-10-CM | POA: Insufficient documentation

## 2024-02-24 DIAGNOSIS — Z77090 Contact with and (suspected) exposure to asbestos: Secondary | ICD-10-CM | POA: Insufficient documentation

## 2024-02-24 MED ORDER — TORSEMIDE 20 MG PO TABS: 40.0000 mg | ORAL_TABLET | Freq: Every day | ORAL | 1 refills | Status: DC

## 2024-02-24 NOTE — Patient Instructions (Addendum)
 Good to see you today!  INCREASEE torsemide  to 40 mg (2 tablets) daily  Lab work in 10 days as scheduled  Your physician recommends that you schedule a follow-up appointment as scheduled  If you have any questions or concerns before your next appointment please send us  a message through [place] or call our office at [phone].    TO LEAVE A MESSAGE FOR THE NURSE SELECT OPTION 2, PLEASE LEAVE A MESSAGE INCLUDING: YOUR NAME DATE OF BIRTH CALL BACK NUMBER REASON FOR CALL**this is important as we prioritize the call backs  YOU WILL RECEIVE A CALL BACK THE SAME DAY AS LONG AS YOU CALL BEFORE 4:00 PM At the [facility] Clinic, you and your health needs are our priority. As part of our continuing mission to provide you with exceptional heart care, we have created designated Provider Care Teams. These Care Teams include your primary Cardiologist (physician) and Advanced Practice Providers (APPs- Physician Assistants and Nurse Practitioners) who all work together to provide you with the care you need, when you need it.   You may see any of the following providers on your designated Care Team at your next follow up: Dr Toribio Fuel Dr Ezra Shuck Dr. Ria Commander Dr. Morene Brownie Amy Lenetta, NP Caffie Shed, [place] [facility] [place], [place] Beckey Coe, NP [place] Lee, NP Ellouise Class, NP Tinnie Redman, PharmD Jaun Bash, PharmD   Please be sure to bring in all your medications bottles to every appointment.    Thank you for choosing [facility]

## 2024-02-24 NOTE — Progress Notes (Signed)
 Pharmacy Quality Measure Review  This patient is appearing on a report for adherence measure for diabetes medications this calendar year.   Medication: empagliflozin  Last fill date: 02/22/2024 for 30 day supply  Insurance report was not up to date. No action needed at this time.   Medication: metformin  Last fill date: 12/25/2023 for 90 day supply  Insurance report was not up to date. No action needed at this time.   Robert Ramirez, PharmD, JAQUELINE, CPP Clinical Pharmacist [facility] [phone]

## 2024-02-29 ENCOUNTER — Ambulatory Visit: Payer: Self-pay | Admitting: Podiatry

## 2024-03-03 NOTE — Progress Notes (Signed)
 Remote PPM Transmission

## 2024-03-07 ENCOUNTER — Encounter: Payer: Self-pay | Admitting: Internal Medicine

## 2024-03-07 ENCOUNTER — Encounter

## 2024-03-07 ENCOUNTER — Ambulatory Visit (HOSPITAL_COMMUNITY): Payer: Self-pay | Admitting: Family Medicine

## 2024-03-07 ENCOUNTER — Ambulatory Visit (HOSPITAL_COMMUNITY)
Admission: RE | Admit: 2024-03-07 | Discharge: 2024-03-07 | Disposition: A | Discharge status: Home / Self Care | Source: Ambulatory Visit | Attending: Internal Medicine | Admitting: Internal Medicine

## 2024-03-07 DIAGNOSIS — I5032 Chronic diastolic (congestive) heart failure: Secondary | ICD-10-CM | POA: Insufficient documentation

## 2024-03-07 LAB — BASIC METABOLIC PANEL WITH GFR
Anion gap: 11 (ref 5–15)
BUN: 31 mg/dL — ABNORMAL HIGH (ref 8–23)
CO2: 36 mmol/L — ABNORMAL HIGH (ref 22–32)
Calcium: 9.5 mg/dL (ref 8.9–10.3)
Chloride: 89 mmol/L — ABNORMAL LOW (ref 98–111)
Creatinine, Ser: 1.36 mg/dL — ABNORMAL HIGH (ref 0.61–1.24)
GFR, Estimated: 54 mL/min — ABNORMAL LOW (ref >60)
Glucose, Bld: 134 mg/dL — ABNORMAL HIGH (ref 70–99)
Potassium: 4.3 mmol/L (ref 3.5–5.1)
Sodium: 136 mmol/L (ref 135–145)

## 2024-03-13 DIAGNOSIS — G4733 Obstructive sleep apnea (adult) (pediatric): Secondary | ICD-10-CM | POA: Diagnosis not present

## 2024-03-15 ENCOUNTER — Ambulatory Visit: Attending: Vascular Surgery | Admitting: Vascular Surgery

## 2024-03-15 ENCOUNTER — Encounter: Payer: Self-pay | Admitting: Vascular Surgery

## 2024-03-15 VITALS — BP 116/62 | HR 82 | Temp 98.4°F | Resp 20 | Ht 73.0 in | Wt 184.0 lb

## 2024-03-15 DIAGNOSIS — I739 Peripheral vascular disease, unspecified: Secondary | ICD-10-CM

## 2024-03-15 NOTE — Progress Notes (Signed)
 Patient name: Robert Ramirez MRN: [id number] DOB: 06-02-1945 Sex: male  REASON FOR CONSULT: Right foot ulcer with PAD, wound check  HPI: Robert Ramirez is a 78 y.o. male, with history of hypertension, hyperlipidemia, diabetes that presents for follow-up with wound check of right foot ulcer with PAD.  Patient was seen about 1 month ago with a right foot ulcer that had been present for 3 weeks.  This is followed by podiatry.  I did offer him angiogram due to marginal toe pressure.  He elected for conservative management and the son thinks this is getting some better today.  Past Medical History:  Diagnosis Date   Diabetes mellitus    High cholesterol    Hypertension    Peripheral arterial disease     Past Surgical History:  Procedure Laterality Date   APPENDECTOMY     ARTERIAL LINE INSERTION N/A 12/09/2023   Procedure: ARTERIAL LINE INSERTION;  Surgeon: Cherrie Toribio SAUNDERS, MD;  Location: [facility] INVASIVE CV LAB;  Service: Cardiovascular;  Laterality: N/A;   CENTRAL LINE INSERTION  12/09/2023   Procedure: CENTRAL LINE INSERTION;  Surgeon: Cherrie Toribio SAUNDERS, MD;  Location: [facility] INVASIVE CV LAB;  Service: Cardiovascular;;   PACEMAKER IMPLANT N/A 06/12/2023   Procedure: PACEMAKER IMPLANT;  Surgeon: Kennyth Chew, MD;  Location[facility] INVASIVE CV LAB;  Service: Cardiovascular;  Laterality: N/A;   RIGHT HEART CATH N/A 12/09/2023   Procedure: RIGHT HEART CATH;  Surgeon: Cherrie Toribio SAUNDERS, MD;  Location: [facility] INVASIVE CV LAB;  Service: Cardiovascular;  Laterality: N/A;   RIGHT/LEFT HEART CATH AND CORONARY ANGIOGRAPHY N/A 03/17/2018   Procedure: RIGHT/LEFT HEART CATH AND CORONARY ANGIOGRAPHY;  Surgeon: Claudene Pacific, MD;  Location: [facility] INVASIVE CV LAB;  Service: Cardiovascular;  Laterality: N/A;    Family History  Problem Relation Age of Onset   Heart failure Mother    Cancer Father    Diabetes Sister    Cancer Sister     SOCIAL HISTORY: Social History   Socioeconomic History   Marital  status: Married    Spouse name: Not on file   Number of children: Not on file   Years of education: Not on file   Highest education level: Not on file  Occupational History   Not on file  Tobacco Use   Smoking status: Former    Current packs/day: 0.00    Types: Cigarettes    Quit date: 03/30/1980    Years since quitting: 43.9    Passive exposure: Never   Smokeless tobacco: Current    Types: Chew, Snuff  Vaping Use   Vaping status: Never Used  Substance and Sexual Activity   Alcohol use: No   Drug use: No   Sexual activity: Not Currently  Other Topics Concern   Not on file  Social History Narrative   Not on file   Social Drivers of Health   Financial Resource Strain: Low Risk  (08/06/2023)   Overall Financial Resource Strain (CARDIA)    Difficulty of Paying Living Expenses: Not hard at all  Food Insecurity: No Food Insecurity (12/21/2023)   Hunger Vital Sign    Worried About Running Out of Food in the Last Year: Never true    Ran Out of Food in the Last Year: Never true  Transportation Needs: No Transportation Needs (12/21/2023)   PRAPARE - Administrator, Civil Service (Medical): No    Lack of Transportation (Non-Medical): No  Physical Activity: Sufficiently Active (08/06/2023)   Exercise Vital Sign  Days of Exercise per Week: 7 days    Minutes of Exercise per Session: 30 min  Stress: No Stress Concern Present (08/06/2023)   [employer] of Occupational Health - Occupational Stress Questionnaire    Feeling of Stress : Not at all  Social Connections: Moderately Isolated (12/08/2023)   Social Connection and Isolation Panel    Frequency of Communication with Friends and Family: More than three times a week    Frequency of Social Gatherings with Friends and Family: More than three times a week    Attends Religious Services: Never    Database administrator or Organizations: No    Attends Banker Meetings: Never    Marital Status: Married   Catering manager Violence: Not At Risk (12/21/2023)   Humiliation, Afraid, Rape, and Kick questionnaire    Fear of Current or Ex-Partner: No    Emotionally Abused: No    Physically Abused: No    Sexually Abused: No    No Known Allergies  Current Outpatient Medications  Medication Sig Dispense Refill   albuterol  (VENTOLIN  HFA) 108 (90 Base) MCG/ACT inhaler Inhale 2 puffs into the lungs every 6 (six) hours as needed for wheezing or shortness of breath. 8 g 2   amLODipine  (NORVASC ) 10 MG tablet Take 1 tablet (10 mg total) by mouth daily. 30 tablet 2   amoxicillin -clavulanate (AUGMENTIN ) 875-125 MG tablet Take 1 tablet by mouth 2 (two) times daily. (Patient not taking: Reported on 02/24/2024) 20 tablet 0   aspirin  EC 81 MG tablet Take 81 mg by mouth daily. Swallow whole.     atorvastatin  (LIPITOR) 40 MG tablet Take 40 mg by mouth daily.     blood glucose meter kit and supplies Dispense based on patient and insurance preference. Use up to four times daily as directed. (FOR ICD-10 E10.9, E11.9). 1 each 0   Cholecalciferol (VITAMIN D-3) 25 MCG (1000 UT) CAPS Take 1 capsule by mouth every morning.     empagliflozin  (JARDIANCE ) 10 MG TABS tablet Take 1 tablet (10 mg total) by mouth daily before breakfast. 90 tablet 3   ezetimibe  (ZETIA ) 10 MG tablet Take 1 tablet (10 mg total) by mouth daily. 90 tablet 3   gentamicin  ointment (GARAMYCIN ) 0.1 % Apply 1 Application topically daily. Apply to wound daily 30 g 0   Insulin  NPH, Human,, Isophane, (HUMULIN  N KWIKPEN) 100 UNIT/ML Kiwkpen Inject 33 Units into the skin in the morning and at bedtime. 60 mL 1   Insulin  Pen Needle (DROPLET PEN NEEDLES) 31G X 8 MM MISC 1 each by Other route 2 (two) times daily with a meal. 200 each 0   levothyroxine  (SYNTHROID ) 50 MCG tablet Take 1 tablet (50 mcg total) by mouth daily. 90 tablet 0   metFORMIN  (GLUCOPHAGE ) 1000 MG tablet Take 1 tablet (1,000 mg total) by mouth 2 (two) times daily with a meal. 180 tablet 0    nitroGLYCERIN  (NITROSTAT ) 0.4 MG SL tablet Place 1 tablet (0.4 mg total) under the tongue every 5 (five) minutes as needed for chest pain. 25 tablet 3   spironolactone  (ALDACTONE ) 25 MG tablet Take 0.5 tablets (12.5 mg total) by mouth daily.     torsemide  (DEMADEX ) 20 MG tablet Take 2 tablets (40 mg total) by mouth daily. 90 tablet 1   vitamin B-12 (CYANOCOBALAMIN ) 500 MCG tablet Take 500 mcg by mouth daily.     No current facility-administered medications for this visit.    REVIEW OF SYSTEMS:  [X]  denotes positive finding, [ ]   denotes negative finding Cardiac  Comments:  Chest pain or chest pressure:    Shortness of breath upon exertion:    Short of breath when lying flat:    Irregular heart rhythm:        Vascular    Pain in calf, thigh, or hip brought on by ambulation:    Pain in feet at night that wakes you up from your sleep:     Blood clot in your veins:    Leg swelling:         Pulmonary    Oxygen at home:    Productive cough:     Wheezing:         Neurologic    Sudden weakness in arms or legs:     Sudden numbness in arms or legs:     Sudden onset of difficulty speaking or slurred speech:    Temporary loss of vision in one eye:     Problems with dizziness:         Gastrointestinal    Blood in stool:     Vomited blood:         Genitourinary    Burning when urinating:     Blood in urine:        Psychiatric    Major depression:         Hematologic    Bleeding problems:    Problems with blood clotting too easily:        Skin    Rashes or ulcers:        Constitutional    Fever or chills:      PHYSICAL EXAM: There were no vitals filed for this visit.  GENERAL: The patient is a well-nourished male, in no acute distress. The vital signs are documented above. CARDIAC: There is a regular rate and rhythm.  VASCULAR:  Bilateral common femoral pulses palpable Right PT palpable Right foot wound pictured      DATA:   ABIs 02/08/2024 are 0.89 on the right  multiphasic with a toe pressure of 66 and 0.99 on the left multiphasic with a toe pressure of 101  Assessment/Plan:  78 y.o. male, with history of hypertension, hyperlipidemia, diabetes that presents for follow-up with wound check of right foot ulcer with PAD.  Wound looks about the same to me compared to one month ago.  Again has a palpable PT pulse at the ankle which is encouraging.  I again discussed that his toe pressures are marginal for wound healing and again offered the option of an angiogram given the chronic wound.  He again wants to avoid any invasive procedure and would like to continue a conservative approach.  I will arrange follow-up in 3 months to the PA clinic for wound check.  Certainly if the wound continues to get worse he needs to reconsider catheter-based angiography.   Lonni DOROTHA Gaskins, MD [facility] Office: [phone]

## 2024-03-17 ENCOUNTER — Ambulatory Visit (INDEPENDENT_AMBULATORY_CARE_PROVIDER_SITE_OTHER): Admitting: Podiatry

## 2024-03-17 VITALS — Ht 73.0 in | Wt 184.0 lb

## 2024-03-17 DIAGNOSIS — L97512 Non-pressure chronic ulcer of other part of right foot with fat layer exposed: Secondary | ICD-10-CM

## 2024-03-17 DIAGNOSIS — E08621 Diabetes mellitus due to underlying condition with foot ulcer: Secondary | ICD-10-CM | POA: Diagnosis not present

## 2024-03-17 NOTE — Progress Notes (Signed)
  Subjective:  Patient ID: Robert Ramirez, male    DOB: 03/14/1946,  MRN: [id number]  Chief Complaint  Patient presents with   Wound Check    RM 8 Patient is here for chronic osteomyelitis of right foot with draining sinus. Wound is open on the lateral side of right foot with moderate drainage.       78 y.o. male presents with the above complaint. History confirmed with patient.  He is still vascular surgery he and they are waiting and deferring angiography at this time.  He has not had any worsening wound to his knowledge  Objective:  Physical Exam: Right foot has nonpalpable pulses with a full-thickness ulcer on the right lateral foot with fibrogranular wound bed surrounding hyperkeratosis no active signs of infection currently.  Measures 1.5 x 1.5 x 0.3 postdebridement, predebridement this measures 1 point porokeratoma [place] 0.2 cm.     Radiographs: Multiple views x-ray of the right foot: early developing erosion on the lateral cortex of the fifth metatarsal head  ABI with toe pressure of 66 with dampened waveforms at the foot and ankle   Assessment:   1. Diabetic ulcer of other part of right foot associated with diabetes mellitus due to underlying condition, with fat layer exposed (HCC)      Plan:  Patient was evaluated and treated and all questions answered.  Ulcer right foot - Sharp full-thickness excisional debridement with scalpel today to remove nonviable tissue slough biofilm and hyperkeratosis from the wound bed.  Predebridement measurements were 1.4 x 1.4 cm postdebridement 1.5 x 1.5 for total area of 0.1 cm.  Discontinue gentamicin  ointment at this time and switch to Prisma collagen dressings.  Advised to apply moistened with saline and dressed with a bandage change every other day.  Continue surgical shoe offloading.  If he does not show meaningful progress in the next 6 weeks then we will refer him back to Dr. Gretta for angiography.     Return in about 3 weeks  (around 04/07/2024) for wound care.

## 2024-03-20 DIAGNOSIS — I509 Heart failure, unspecified: Secondary | ICD-10-CM | POA: Diagnosis not present

## 2024-03-23 ENCOUNTER — Other Ambulatory Visit: Payer: Self-pay | Admitting: Pharmacist

## 2024-03-23 NOTE — Progress Notes (Signed)
 Pharmacy Quality Measure Review  This patient is appearing on a report for adherence measure for diabetes medications this calendar year.   Medication: empagliflozin  Last fill date: 03/19/2024 for 30 day supply  No follow-up needed at this time.   Medication: metformin  Last fill date: 12/25/2023 for 90 day supply  Call placed to pharmacy. Per technician there, they are filling this and will call him when it's ready for pick-up. Reminder set for next week to make sure he gets this.  Herlene Fleeta Morris, PharmD, JAQUELINE, CPP Clinical Pharmacist [facility] [phone]

## 2024-03-29 ENCOUNTER — Other Ambulatory Visit: Payer: Self-pay | Admitting: Pharmacist

## 2024-03-29 ENCOUNTER — Telehealth: Payer: Self-pay

## 2024-03-29 NOTE — Telephone Encounter (Signed)
 Called to confirm/remind patient of their appointment at the [facility] on 03/30/24.   Appointment:   [x] Confirmed  [] Left mess   [] No answer/No voice mail  [] VM Full/unable to leave message  [] Phone not in service  Patient reminded to bring all medications and/or complete list.  Confirmed patient has transportation. Gave directions, instructed to utilize valet parking.

## 2024-03-29 NOTE — Progress Notes (Incomplete)
 [facility] Note   PCP: Lorren Greig PARAS, NP Cardiologist: Lurena MARLA Red, MD  HF Cardiologist: Dr. Cherrie  HPI: 78 y.o. male with history of CAD, chronic HFpEF with RV failure, DM, HTN, HLD, OSA and chronic respiratory failure on BiPAP, CHB s/p dual chamber PPM with LBBAP lead in 01/25, hx asbestos exposure with pleural plaque on prior HRCT but no ILD, remote tobacco use.    Appears to be evidence of RV dysfunction on echo dating back to 2019. L/RHC in 02/2018: Nonobstructive CAD involving RCA, PCWP mean 8, PA 46/16 (26), Fick CO/CI 4.45/2.07, TD CO/CI 3.64/1.69.    Admitted for acute decompensated HFpEF and RV failure 12/07/23. Echo showed EF 60-65%, interventricular septum is flattened, RV enlarged and HK, RVSP 58 mmHg, moderate TR. RHC showed RA 16, mPA 34, PCWP 11, TD CO/CI 3.8/1.8, PVR 6.0. Required ICU stay for BiPAP and inotrope/pressor support.   Seen by PCP 12/25/23. Noted to by hypotensive 74/38, asymptomatic. BUN >100. Referred to nutrition, ENT for dysphagia, and nephrology.   Today he returns for HF follow up. Overall feeling fine. Wears 2L oxygen and BiPap at night. He has increased LEE. Has new weeping ulcer on right lower leg. Able to walk short distance around his house without significant dyspnea. Chronically dizzy x years. Denies palpitations, abnormal bleeding, CP, or PND/Orthopnea. Appetite ok. Taking all medications.  Sees podiatry for chronic osteomyelitis, step son helps with daily dressing changes. Saw [facility] and was offered lower extremity angiogram, however patient declined invasive treatments. Followed by palliative care.     Past Medical History:  Diagnosis Date   Diabetes mellitus    High cholesterol    Hypertension    Peripheral arterial disease    Current Outpatient Medications  Medication Sig Dispense Refill   albuterol  (VENTOLIN  HFA) 108 (90 Base) MCG/ACT inhaler Inhale 2 puffs into the lungs every 6 (six) hours as needed for wheezing or  shortness of breath. 8 g 2   amLODipine  (NORVASC ) 10 MG tablet Take 1 tablet (10 mg total) by mouth daily. 30 tablet 2   aspirin  EC 81 MG tablet Take 81 mg by mouth daily. Swallow whole.     atorvastatin  (LIPITOR) 40 MG tablet Take 40 mg by mouth daily.     blood glucose meter kit and supplies Dispense based on patient and insurance preference. Use up to four times daily as directed. (FOR ICD-10 E10.9, E11.9). 1 each 0   Cholecalciferol (VITAMIN D-3) 25 MCG (1000 UT) CAPS Take 1 capsule by mouth every morning.     empagliflozin  (JARDIANCE ) 10 MG TABS tablet Take 1 tablet (10 mg total) by mouth daily before breakfast. 90 tablet 3   ezetimibe  (ZETIA ) 10 MG tablet Take 1 tablet (10 mg total) by mouth daily. 90 tablet 3   gentamicin  ointment (GARAMYCIN ) 0.1 % Apply 1 Application topically daily. Apply to wound daily 30 g 0   Insulin  NPH, Human,, Isophane, (HUMULIN  N KWIKPEN) 100 UNIT/ML Kiwkpen Inject 33 Units into the skin in the morning and at bedtime. 60 mL 1   Insulin  Pen Needle (DROPLET PEN NEEDLES) 31G X 8 MM MISC 1 each by Other route 2 (two) times daily with a meal. 200 each 0   levothyroxine  (SYNTHROID ) 50 MCG tablet Take 1 tablet (50 mcg total) by mouth daily. 90 tablet 0   metFORMIN  (GLUCOPHAGE ) 1000 MG tablet Take 1 tablet (1,000 mg total) by mouth 2 (two) times daily with a meal. 180 tablet 0   nitroGLYCERIN  (  NITROSTAT ) 0.4 MG SL tablet Place 1 tablet (0.4 mg total) under the tongue every 5 (five) minutes as needed for chest pain. 25 tablet 3   spironolactone  (ALDACTONE ) 25 MG tablet Take 0.5 tablets (12.5 mg total) by mouth daily.     torsemide  (DEMADEX ) 20 MG tablet Take 2 tablets (40 mg total) by mouth daily. 90 tablet 1   vitamin B-12 (CYANOCOBALAMIN ) 500 MCG tablet Take 500 mcg by mouth daily.     No current facility-administered medications for this encounter.   No Known Allergies  Social History   Socioeconomic History   Marital status: Married    Spouse name: Not on file    Number of children: Not on file   Years of education: Not on file   Highest education level: Not on file  Occupational History   Not on file  Tobacco Use   Smoking status: Former    Current packs/day: 0.00    Types: Cigarettes    Quit date: 03/30/1980    Years since quitting: 44.0    Passive exposure: Never   Smokeless tobacco: Current    Types: Chew, Snuff  Vaping Use   Vaping status: Never Used  Substance and Sexual Activity   Alcohol use: No   Drug use: No   Sexual activity: Not Currently  Other Topics Concern   Not on file  Social History Narrative   Not on file   Social Drivers of Health   Financial Resource Strain: Low Risk  (08/06/2023)   Overall Financial Resource Strain (CARDIA)    Difficulty of Paying Living Expenses: Not hard at all  Food Insecurity: No Food Insecurity (12/21/2023)   Hunger Vital Sign    Worried About Running Out of Food in the Last Year: Never true    Ran Out of Food in the Last Year: Never true  Transportation Needs: No Transportation Needs (12/21/2023)   PRAPARE - Administrator, Civil Service (Medical): No    Lack of Transportation (Non-Medical): No  Physical Activity: Sufficiently Active (08/06/2023)   Exercise Vital Sign    Days of Exercise per Week: 7 days    Minutes of Exercise per Session: 30 min  Stress: No Stress Concern Present (08/06/2023)   [employer] of Occupational Health - Occupational Stress Questionnaire    Feeling of Stress : Not at all  Social Connections: Moderately Isolated (12/08/2023)   Social Connection and Isolation Panel    Frequency of Communication with Friends and Family: More than three times a week    Frequency of Social Gatherings with Friends and Family: More than three times a week    Attends Religious Services: Never    Database Administrator or Organizations: No    Attends Banker Meetings: Never    Marital Status: Married  Catering Manager Violence: Not At Risk (12/21/2023)    Humiliation, Afraid, Rape, and Kick questionnaire    Fear of Current or Ex-Partner: No    Emotionally Abused: No    Physically Abused: No    Sexually Abused: No    Family History  Problem Relation Age of Onset   Heart failure Mother    Cancer Father    Diabetes Sister    Cancer Sister    Wt Readings from Last 3 Encounters:  03/30/24 90.3 kg (199 lb)  03/17/24 83.5 kg (184 lb)  03/15/24 83.5 kg (184 lb)   BP (!) 112/58   Pulse 66   Ht 6' 1 (1.854 m)  Wt 90.3 kg (199 lb)   SpO2 96%   BMI 26.25 kg/m   PHYSICAL EXAM: General:  NAD. No resp difficulty, chronically-ill appearing, arrived in [facility] HEENT: Normal Neck: Supple. JVP to jaw Cor: Regular rate & rhythm. No rubs, gallops or murmurs. Lungs: Diminished Abdomen: Soft, nontender, nondistended.  Extremities: No cyanosis, clubbing, rash, 2+ BLE pre-tibial edema; weeping ulcer to R lower leg Neuro: Alert & oriented x 3, moves all 4 extremities w/o difficulty. Affect pleasant.  ReDs reading: 41 %, abnormal  St. Scientist, Product/process Development (personally reviewed): AF/AT burden <1%, VP 98%, no CorVue   ASSESSMENT & PLAN: End-Stage Chronic HFpEF / PAH / Cor Pulmonale - Evidence of RV dysfunction on echo dating back to 2019.  - L/RHC in 10/19: Nonobstructive CAD involving RCA, PCW 8, PA 46/16 (26), Fick CO/CI 4.45/2.07, TD CO/CI 3.64/1.69.  - PFTs in 2020 with mixed restrictive and obstructive defect. CT w/o evidence of ILD. - Echo 7/25: EF 60-65%, interventricular septum is flattened, RV enlarged and HK, RVSP 58 mmHg, mod TR - RHC 07/25: RA 16, PA mean 34, PCWP 11, TD CO/CI 3.8/1.8, unable to read Fick, PVR 6.0, PAPi 1.4 - Given severe CO2 retention suspect most WHO Group 3 disease but may have component of WHO Group 1.  - Reports adherence with home BiPAP  - NYHA IIIb. Volume way up, weight up 15 lbs, LEE weeping. REDs 41%  - Urinating briskly with torsemide  40 daily, will increase to bid. Add 40 KCL daily. - Continue  Jardiance  10 mg daily. May need to stop with LE wounds - Continue spiro 12.5 mg daily. - Off lisinopril  and chlorthalidone  with hypotension - End stage PAH/cor pulmonale - Labs today.  2. CKD IIIa - Baseline SCr 1.3-1.4. - Continue SGLT2i for now - Labs today.  3. PAD - chronic osteomyelitis to R foot - Follows with podiatry and [facility] - Continue statin + ASA - New RLE weeping ulcer, refer to Wound Clinic - Check CBC   4. CHB  - s/p dual chamber PPM w/ LBBAP lead  5. Atrial Flutter - Device interrogation as above - Off amiodarone  with pulmonary disease   6. 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 Failure to Thrive - Followed by [facility] Palliative Care  - Has home heath PT   Follow up in 2 weeks with APP for fluid check. May require Furoscix  (step son could assist with this if needed)  Lorain Keast M Kaylinn Dedic, FNP 03/30/24

## 2024-03-29 NOTE — Progress Notes (Signed)
 Pharmacy Quality Measure Review  This patient is appearing on a report for adherence measure for diabetes medications this calendar year.   Medication: empagliflozin  Last fill date: 03/19/2024 for 30 day supply  No follow-up needed at this time.   Medication: metformin  Last fill date: 03/23/24 for 30 day supply  No follow-up needed at this time.   Herlene Fleeta Morris, PharmD, JAQUELINE, CPP Clinical Pharmacist [facility] [phone]

## 2024-03-30 ENCOUNTER — Ambulatory Visit (HOSPITAL_COMMUNITY)
Admission: RE | Admit: 2024-03-30 | Discharge: 2024-03-30 | Disposition: A | Discharge status: Home / Self Care | Source: Ambulatory Visit | Attending: Family Medicine | Admitting: Family Medicine

## 2024-03-30 ENCOUNTER — Encounter (HOSPITAL_COMMUNITY): Payer: Self-pay

## 2024-03-30 ENCOUNTER — Ambulatory Visit (HOSPITAL_COMMUNITY): Payer: Self-pay | Admitting: Family Medicine

## 2024-03-30 VITALS — BP 112/58 | HR 66 | Ht 73.0 in | Wt 199.0 lb

## 2024-03-30 DIAGNOSIS — L97919 Non-pressure chronic ulcer of unspecified part of right lower leg with unspecified severity: Secondary | ICD-10-CM | POA: Diagnosis not present

## 2024-03-30 DIAGNOSIS — I132 Hypertensive heart and chronic kidney disease with heart failure and with stage 5 chronic kidney disease, or end stage renal disease: Secondary | ICD-10-CM | POA: Insufficient documentation

## 2024-03-30 DIAGNOSIS — N1831 Chronic kidney disease, stage 3a: Secondary | ICD-10-CM

## 2024-03-30 DIAGNOSIS — L989 Disorder of the skin and subcutaneous tissue, unspecified: Secondary | ICD-10-CM | POA: Insufficient documentation

## 2024-03-30 DIAGNOSIS — N186 End stage renal disease: Secondary | ICD-10-CM | POA: Diagnosis not present

## 2024-03-30 DIAGNOSIS — E1151 Type 2 diabetes mellitus with diabetic peripheral angiopathy without gangrene: Secondary | ICD-10-CM | POA: Insufficient documentation

## 2024-03-30 DIAGNOSIS — I5032 Chronic diastolic (congestive) heart failure: Secondary | ICD-10-CM | POA: Diagnosis present

## 2024-03-30 DIAGNOSIS — J984 Other disorders of lung: Secondary | ICD-10-CM | POA: Insufficient documentation

## 2024-03-30 DIAGNOSIS — E1169 Type 2 diabetes mellitus with other specified complication: Secondary | ICD-10-CM | POA: Diagnosis not present

## 2024-03-30 DIAGNOSIS — I442 Atrioventricular block, complete: Secondary | ICD-10-CM | POA: Insufficient documentation

## 2024-03-30 DIAGNOSIS — I2781 Cor pulmonale (chronic): Secondary | ICD-10-CM | POA: Insufficient documentation

## 2024-03-30 DIAGNOSIS — Z7982 Long term (current) use of aspirin: Secondary | ICD-10-CM | POA: Insufficient documentation

## 2024-03-30 DIAGNOSIS — Z79899 Other long term (current) drug therapy: Secondary | ICD-10-CM | POA: Insufficient documentation

## 2024-03-30 DIAGNOSIS — M86671 Other chronic osteomyelitis, right ankle and foot: Secondary | ICD-10-CM | POA: Insufficient documentation

## 2024-03-30 DIAGNOSIS — I4892 Unspecified atrial flutter: Secondary | ICD-10-CM | POA: Diagnosis not present

## 2024-03-30 DIAGNOSIS — I2510 Atherosclerotic heart disease of native coronary artery without angina pectoris: Secondary | ICD-10-CM | POA: Diagnosis not present

## 2024-03-30 DIAGNOSIS — E1122 Type 2 diabetes mellitus with diabetic chronic kidney disease: Secondary | ICD-10-CM | POA: Diagnosis not present

## 2024-03-30 DIAGNOSIS — R627 Adult failure to thrive: Secondary | ICD-10-CM | POA: Insufficient documentation

## 2024-03-30 DIAGNOSIS — Z7984 Long term (current) use of oral hypoglycemic drugs: Secondary | ICD-10-CM | POA: Insufficient documentation

## 2024-03-30 DIAGNOSIS — M8660 Other chronic osteomyelitis, unspecified site: Secondary | ICD-10-CM | POA: Diagnosis not present

## 2024-03-30 DIAGNOSIS — G4733 Obstructive sleep apnea (adult) (pediatric): Secondary | ICD-10-CM | POA: Diagnosis not present

## 2024-03-30 DIAGNOSIS — E7800 Pure hypercholesterolemia, unspecified: Secondary | ICD-10-CM | POA: Insufficient documentation

## 2024-03-30 DIAGNOSIS — Z794 Long term (current) use of insulin: Secondary | ICD-10-CM | POA: Diagnosis not present

## 2024-03-30 DIAGNOSIS — I739 Peripheral vascular disease, unspecified: Secondary | ICD-10-CM

## 2024-03-30 DIAGNOSIS — Z9981 Dependence on supplemental oxygen: Secondary | ICD-10-CM | POA: Insufficient documentation

## 2024-03-30 DIAGNOSIS — F17220 Nicotine dependence, chewing tobacco, uncomplicated: Secondary | ICD-10-CM | POA: Diagnosis not present

## 2024-03-30 DIAGNOSIS — J9610 Chronic respiratory failure, unspecified whether with hypoxia or hypercapnia: Secondary | ICD-10-CM | POA: Diagnosis not present

## 2024-03-30 DIAGNOSIS — Z77090 Contact with and (suspected) exposure to asbestos: Secondary | ICD-10-CM | POA: Insufficient documentation

## 2024-03-30 LAB — BASIC METABOLIC PANEL WITH GFR
Anion gap: 12 (ref 5–15)
BUN: 28 mg/dL — ABNORMAL HIGH (ref 8–23)
CO2: 35 mmol/L — ABNORMAL HIGH (ref 22–32)
Calcium: 9.9 mg/dL (ref 8.9–10.3)
Chloride: 92 mmol/L — ABNORMAL LOW (ref 98–111)
Creatinine, Ser: 1.27 mg/dL — ABNORMAL HIGH (ref 0.61–1.24)
GFR, Estimated: 58 mL/min — ABNORMAL LOW (ref >60)
Glucose, Bld: 150 mg/dL — ABNORMAL HIGH (ref 70–99)
Potassium: 4.9 mmol/L (ref 3.5–5.1)
Sodium: 139 mmol/L (ref 135–145)

## 2024-03-30 LAB — CBC
HCT: 42.2 % (ref 39.0–52.0)
Hemoglobin: 13.7 g/dL (ref 13.0–17.0)
MCH: 30.1 pg (ref 26.0–34.0)
MCHC: 32.5 g/dL (ref 30.0–36.0)
MCV: 92.7 fL (ref 80.0–100.0)
Platelets: 227 K/uL (ref 150–400)
RBC: 4.55 MIL/uL (ref 4.22–5.81)
RDW: 12.9 % (ref 11.5–15.5)
WBC: 10.7 K/uL — ABNORMAL HIGH (ref 4.0–10.5)
nRBC: 0 % (ref 0.0–0.2)

## 2024-03-30 LAB — BRAIN NATRIURETIC PEPTIDE: B Natriuretic Peptide: 128.8 pg/mL — ABNORMAL HIGH (ref 0.0–100.0)

## 2024-03-30 MED ORDER — POTASSIUM CHLORIDE CRYS ER 20 MEQ PO TBCR: 40.0000 meq | EXTENDED_RELEASE_TABLET | Freq: Every day | ORAL | 1 refills | Status: DC

## 2024-03-30 MED ORDER — TORSEMIDE 20 MG PO TABS: 40.0000 mg | ORAL_TABLET | Freq: Two times a day (BID) | ORAL | 1 refills | Status: DC

## 2024-03-30 NOTE — Patient Instructions (Addendum)
 Good to see you today!  INCREASE torsemide  to 40 mg Twice daily  START potassium 40 meq(2 tablets) Daily  Labs done today, your results will be available in MyChart, we will contact you for abnormal readings . You have been referred to wound center they will call you   Your physician recommends that you schedule a follow-up appointment as scheduled  If you have any questions or concerns before your next appointment please send us  a message through [place] or call our office at [phone].    TO LEAVE A MESSAGE FOR THE NURSE SELECT OPTION 2, PLEASE LEAVE A MESSAGE INCLUDING: YOUR NAME DATE OF BIRTH CALL BACK NUMBER REASON FOR CALL**this is important as we prioritize the call backs  YOU WILL RECEIVE A CALL BACK THE SAME DAY AS LONG AS YOU CALL BEFORE 4:00 PM At the [facility], you and your health needs are our priority. As part of our continuing mission to provide you with exceptional heart care, we have created designated Provider Care Teams. These Care Teams include your primary Cardiologist (physician) and Advanced Practice Providers (APPs- Physician Assistants and Nurse Practitioners) who all work together to provide you with the care you need, when you need it.   You may see any of the following providers on your designated Care Team at your next follow up: Dr Toribio Fuel Dr Ezra Shuck Dr. Morene Brownie Greig Mosses, NP Caffie Shed, [place] [facility] [place], [place] Beckey Coe, NP Jordan Lee, NP Ellouise Class, NP Tinnie Redman, PharmD Jaun Bash, PharmD   Please be sure to bring in all your medications bottles to every appointment.    Thank you for choosing [facility]

## 2024-03-30 NOTE — Progress Notes (Signed)
 ReDS Vest / Clip - 03/30/24 1326       ReDS Vest / Clip   Station Marker D    Ruler Value 33    ReDS Value Range High volume overload    ReDS Actual Value 41

## 2024-03-31 ENCOUNTER — Encounter (HOSPITAL_BASED_OUTPATIENT_CLINIC_OR_DEPARTMENT_OTHER): Attending: Internal Medicine | Admitting: Internal Medicine

## 2024-03-31 DIAGNOSIS — G4733 Obstructive sleep apnea (adult) (pediatric): Secondary | ICD-10-CM | POA: Insufficient documentation

## 2024-03-31 DIAGNOSIS — I5030 Unspecified diastolic (congestive) heart failure: Secondary | ICD-10-CM

## 2024-03-31 DIAGNOSIS — I87311 Chronic venous hypertension (idiopathic) with ulcer of right lower extremity: Secondary | ICD-10-CM

## 2024-03-31 DIAGNOSIS — I890 Lymphedema, not elsewhere classified: Secondary | ICD-10-CM | POA: Diagnosis not present

## 2024-03-31 DIAGNOSIS — E11622 Type 2 diabetes mellitus with other skin ulcer: Secondary | ICD-10-CM

## 2024-04-04 ENCOUNTER — Encounter (HOSPITAL_BASED_OUTPATIENT_CLINIC_OR_DEPARTMENT_OTHER): Admitting: Internal Medicine

## 2024-04-04 ENCOUNTER — Other Ambulatory Visit: Payer: Self-pay | Admitting: Family

## 2024-04-04 DIAGNOSIS — I10 Essential (primary) hypertension: Secondary | ICD-10-CM

## 2024-04-04 NOTE — Telephone Encounter (Unsigned)
 Copied from CRM [id number]. Topic: Clinical - Medication Refill >> Apr 04, 2024  1:00 PM Carla L wrote: Medication: amLODipine  (NORVASC ) 10 MG tablet  Has the patient contacted their pharmacy? Yes Told to contact  office for further refills.   This is the patient's preferred pharmacy:  [facility] [address] ([address]), [place] - [address] [place] ([place]) [place] [address] Phone: [phone] Fax: [phone]  Is this the correct pharmacy for this prescription? Yes   Has the prescription been filled recently? No  Is the patient out of the medication? Yes  Has the patient been seen for an appointment in the last year OR does the patient have an upcoming appointment? Yes  Can we respond through MyChart? No  Agent: Please be advised that Rx refills may take up to 3 business days. We ask that you follow-up with your pharmacy.

## 2024-04-06 ENCOUNTER — Ambulatory Visit (HOSPITAL_COMMUNITY)
Admission: RE | Admit: 2024-04-06 | Discharge: 2024-04-06 | Disposition: A | Discharge status: Home / Self Care | Source: Ambulatory Visit | Attending: Internal Medicine | Admitting: Internal Medicine

## 2024-04-06 DIAGNOSIS — I5032 Chronic diastolic (congestive) heart failure: Secondary | ICD-10-CM | POA: Diagnosis present

## 2024-04-06 LAB — BASIC METABOLIC PANEL WITH GFR
Anion gap: 12 (ref 5–15)
BUN: 41 mg/dL — ABNORMAL HIGH (ref 8–23)
CO2: 35 mmol/L — ABNORMAL HIGH (ref 22–32)
Calcium: 9.4 mg/dL (ref 8.9–10.3)
Chloride: 89 mmol/L — ABNORMAL LOW (ref 98–111)
Creatinine, Ser: 1.65 mg/dL — ABNORMAL HIGH (ref 0.61–1.24)
GFR, Estimated: 43 mL/min — ABNORMAL LOW (ref >60)
Glucose, Bld: 167 mg/dL — ABNORMAL HIGH (ref 70–99)
Potassium: 5.5 mmol/L — ABNORMAL HIGH (ref 3.5–5.1)
Sodium: 136 mmol/L (ref 135–145)

## 2024-04-06 MED ORDER — AMLODIPINE BESYLATE 10 MG PO TABS: 10.0000 mg | ORAL_TABLET | Freq: Every day | ORAL | 0 refills | Status: DC

## 2024-04-06 NOTE — Telephone Encounter (Signed)
 Requested Prescriptions  Pending Prescriptions Disp Refills   amLODipine  (NORVASC ) 10 MG tablet 90 tablet 0    Sig: Take 1 tablet (10 mg total) by mouth daily.     Cardiovascular: Calcium  Channel Blockers 2 Passed - 04/06/2024 11:51 AM      Passed - Last BP in normal range    BP Readings from Last 1 Encounters:  03/30/24 (!) 112/58         Passed - Last Heart Rate in normal range    Pulse Readings from Last 1 Encounters:  03/30/24 66         Passed - Valid encounter within last 6 months    Recent Outpatient Visits           3 months ago Hospital discharge follow-up   [facility], Amy J, NP   6 months ago Type 2 diabetes mellitus with hyperglycemia, with long-term current use of insulin  [facility])   [place] [facility] at [facility], [place], NP   9 months ago Annual physical exam   [place] [facility] at [facility], [place], NP   1 year ago Medicare annual wellness visit, subsequent   [place] [facility] at [facility], Amy J, NP   1 year ago Type 2 diabetes mellitus with retinopathy, with long-term current use of insulin , macular edema presence unspecified, unspecified laterality, unspecified retinopathy severity [facility])   [place] [facility] at [facility], Greig PARAS, NP

## 2024-04-07 ENCOUNTER — Ambulatory Visit: Admitting: Podiatry

## 2024-04-07 VITALS — Ht 73.0 in | Wt 199.0 lb

## 2024-04-07 DIAGNOSIS — E08621 Diabetes mellitus due to underlying condition with foot ulcer: Secondary | ICD-10-CM | POA: Diagnosis not present

## 2024-04-07 DIAGNOSIS — L97512 Non-pressure chronic ulcer of other part of right foot with fat layer exposed: Secondary | ICD-10-CM

## 2024-04-09 ENCOUNTER — Other Ambulatory Visit (HOSPITAL_BASED_OUTPATIENT_CLINIC_OR_DEPARTMENT_OTHER): Payer: Self-pay | Admitting: Primary Care

## 2024-04-10 ENCOUNTER — Ambulatory Visit (HOSPITAL_COMMUNITY): Payer: Self-pay | Admitting: Internal Medicine

## 2024-04-10 NOTE — Progress Notes (Signed)
  Subjective:  Patient ID: Robert Ramirez, male    DOB: 01/22/1946,  MRN: [id number]  Chief Complaint  Patient presents with   Wound Check    Rm 16 Patient is here for ulcer of the right foot. Wound is open with moderate drainage.Pt  currently has a wound care nurse that comes to his home.       78 y.o. male presents with the above complaint. History confirmed with patient.  Now going to the wound care center for care for new wounds on the legs  Objective:  Physical Exam: Right foot has nonpalpable pulses with a full-thickness ulcer on the right lateral foot with fibrogranular wound bed surrounding hyperkeratosis no active signs of infection currently.  Measures 1.3 x 1.2 x 0.3 postdebridement,       Radiographs: Multiple views x-ray of the right foot: early developing erosion on the lateral cortex of the fifth metatarsal head  ABI with toe pressure of 66 with dampened waveforms at the foot and ankle   Assessment:   1. Diabetic ulcer of other part of right foot associated with diabetes mellitus due to underlying condition, with fat layer exposed (HCC)      Plan:  Patient was evaluated and treated and all questions answered.  Ulcer right foot - Sharp full-thickness excisional debridement with scalpel today to remove nonviable tissue slough biofilm and hyperkeratosis from the wound bed.  Predebridement measurements were 1.2 x 1.2 cm postdebridement 1.3 x 1.2 for total area of 0.1 cm.  Prisma collagen dressing applied.  Will discuss with the wound care center if they are able to see him for his foot ulcerations as well which has been infection free healing slowly.  If they are unable I will see him back for follow-up, He says he is still not interested in any surgery on the foot at this time   No follow-ups on file.

## 2024-04-11 ENCOUNTER — Telehealth (HOSPITAL_COMMUNITY): Payer: Self-pay | Admitting: *Deleted

## 2024-04-11 DIAGNOSIS — I5032 Chronic diastolic (congestive) heart failure: Secondary | ICD-10-CM

## 2024-04-11 NOTE — Telephone Encounter (Signed)
 Called patient per Dr. Cherrie with following lab results and instructions:  Needs repeat BMET on Monday please  Pt says he can come tomorrow following his wound clinic appointment. Scheduled and ordered.  Pt says he cannot come to clinic Wednesday of this week as scheduled; he has another appointment. APP Clinic appointment re-scheduled per patient request.

## 2024-04-12 ENCOUNTER — Encounter (HOSPITAL_BASED_OUTPATIENT_CLINIC_OR_DEPARTMENT_OTHER): Admitting: Internal Medicine

## 2024-04-12 ENCOUNTER — Ambulatory Visit (HOSPITAL_COMMUNITY)
Admission: RE | Admit: 2024-04-12 | Discharge: 2024-04-12 | Disposition: A | Discharge status: Home / Self Care | Source: Ambulatory Visit | Attending: Cardiology | Admitting: Cardiology

## 2024-04-12 ENCOUNTER — Telehealth: Payer: Self-pay | Admitting: Family

## 2024-04-12 DIAGNOSIS — I87311 Chronic venous hypertension (idiopathic) with ulcer of right lower extremity: Secondary | ICD-10-CM | POA: Diagnosis not present

## 2024-04-12 DIAGNOSIS — E11622 Type 2 diabetes mellitus with other skin ulcer: Secondary | ICD-10-CM | POA: Diagnosis not present

## 2024-04-12 DIAGNOSIS — I5032 Chronic diastolic (congestive) heart failure: Secondary | ICD-10-CM | POA: Diagnosis present

## 2024-04-12 DIAGNOSIS — I890 Lymphedema, not elsewhere classified: Secondary | ICD-10-CM

## 2024-04-12 LAB — BASIC METABOLIC PANEL WITH GFR
Anion gap: 18 — ABNORMAL HIGH (ref 5–15)
BUN: 41 mg/dL — ABNORMAL HIGH (ref 8–23)
CO2: 32 mmol/L (ref 22–32)
Calcium: 10.3 mg/dL (ref 8.9–10.3)
Chloride: 91 mmol/L — ABNORMAL LOW (ref 98–111)
Creatinine, Ser: 1.54 mg/dL — ABNORMAL HIGH (ref 0.61–1.24)
GFR, Estimated: 46 mL/min — ABNORMAL LOW (ref >60)
Glucose, Bld: 136 mg/dL — ABNORMAL HIGH (ref 70–99)
Potassium: 5.8 mmol/L — ABNORMAL HIGH (ref 3.5–5.1)
Sodium: 141 mmol/L (ref 135–145)

## 2024-04-12 NOTE — Telephone Encounter (Signed)
 Copied from CRM #[id number]. Topic: Clinical - Medical Advice >> Apr 12, 2024  8:29 AM [name] wrote: Reason for CRM: pt has been put on oxygen, and family requested an [employer]igent pack to carry  Step son Robert Ramirez. Cb [phone]

## 2024-04-12 NOTE — Telephone Encounter (Signed)
 Patient established with [facility] at [facility]. Please request advisement from the same.

## 2024-04-13 ENCOUNTER — Ambulatory Visit (HOSPITAL_COMMUNITY)

## 2024-04-14 ENCOUNTER — Encounter: Payer: Self-pay | Admitting: Internal Medicine

## 2024-04-15 NOTE — Telephone Encounter (Signed)
 I called patient son Nancyann and made him aware of pcp recommendations

## 2024-04-16 ENCOUNTER — Ambulatory Visit (HOSPITAL_COMMUNITY): Payer: Self-pay | Admitting: Internal Medicine

## 2024-04-19 ENCOUNTER — Telehealth (HOSPITAL_COMMUNITY): Payer: Self-pay

## 2024-04-19 ENCOUNTER — Ambulatory Visit (HOSPITAL_COMMUNITY)

## 2024-04-19 NOTE — Telephone Encounter (Signed)
 Called to confirm/remind patient of their appointment at the [facility] on 04/20/24.   Appointment:   [x] Confirmed  [] Left mess   [] No answer/No voice mail  [] VM Full/unable to leave message  [] Phone not in service  Patient reminded to bring all medications and/or complete list.  Confirmed patient has transportation. Gave directions, instructed to utilize valet parking.

## 2024-04-20 ENCOUNTER — Other Ambulatory Visit: Payer: Self-pay | Admitting: Pharmacist

## 2024-04-20 ENCOUNTER — Ambulatory Visit (HOSPITAL_COMMUNITY)
Admission: RE | Admit: 2024-04-20 | Discharge: 2024-04-20 | Disposition: A | Discharge status: Home / Self Care | Source: Ambulatory Visit | Attending: Cardiology | Admitting: Cardiology

## 2024-04-20 ENCOUNTER — Ambulatory Visit (HOSPITAL_COMMUNITY)

## 2024-04-20 ENCOUNTER — Ambulatory Visit (HOSPITAL_COMMUNITY): Payer: Self-pay | Admitting: Cardiology

## 2024-04-20 ENCOUNTER — Encounter (HOSPITAL_COMMUNITY): Payer: Self-pay

## 2024-04-20 VITALS — BP 118/50 | HR 85 | Ht 73.0 in | Wt 195.0 lb

## 2024-04-20 DIAGNOSIS — E785 Hyperlipidemia, unspecified: Secondary | ICD-10-CM | POA: Insufficient documentation

## 2024-04-20 DIAGNOSIS — N1832 Chronic kidney disease, stage 3b: Secondary | ICD-10-CM | POA: Diagnosis not present

## 2024-04-20 DIAGNOSIS — Z7984 Long term (current) use of oral hypoglycemic drugs: Secondary | ICD-10-CM | POA: Insufficient documentation

## 2024-04-20 DIAGNOSIS — Z87891 Personal history of nicotine dependence: Secondary | ICD-10-CM | POA: Diagnosis not present

## 2024-04-20 DIAGNOSIS — E1122 Type 2 diabetes mellitus with diabetic chronic kidney disease: Secondary | ICD-10-CM | POA: Insufficient documentation

## 2024-04-20 DIAGNOSIS — J9610 Chronic respiratory failure, unspecified whether with hypoxia or hypercapnia: Secondary | ICD-10-CM | POA: Diagnosis not present

## 2024-04-20 DIAGNOSIS — I4892 Unspecified atrial flutter: Secondary | ICD-10-CM | POA: Diagnosis not present

## 2024-04-20 DIAGNOSIS — I2510 Atherosclerotic heart disease of native coronary artery without angina pectoris: Secondary | ICD-10-CM | POA: Insufficient documentation

## 2024-04-20 DIAGNOSIS — Z77090 Contact with and (suspected) exposure to asbestos: Secondary | ICD-10-CM | POA: Insufficient documentation

## 2024-04-20 DIAGNOSIS — R627 Adult failure to thrive: Secondary | ICD-10-CM | POA: Insufficient documentation

## 2024-04-20 DIAGNOSIS — Z7982 Long term (current) use of aspirin: Secondary | ICD-10-CM | POA: Diagnosis not present

## 2024-04-20 DIAGNOSIS — I132 Hypertensive heart and chronic kidney disease with heart failure and with stage 5 chronic kidney disease, or end stage renal disease: Secondary | ICD-10-CM | POA: Insufficient documentation

## 2024-04-20 DIAGNOSIS — I739 Peripheral vascular disease, unspecified: Secondary | ICD-10-CM | POA: Diagnosis not present

## 2024-04-20 DIAGNOSIS — Z794 Long term (current) use of insulin: Secondary | ICD-10-CM | POA: Insufficient documentation

## 2024-04-20 DIAGNOSIS — I2781 Cor pulmonale (chronic): Secondary | ICD-10-CM | POA: Diagnosis not present

## 2024-04-20 DIAGNOSIS — L989 Disorder of the skin and subcutaneous tissue, unspecified: Secondary | ICD-10-CM | POA: Insufficient documentation

## 2024-04-20 DIAGNOSIS — I5032 Chronic diastolic (congestive) heart failure: Secondary | ICD-10-CM | POA: Insufficient documentation

## 2024-04-20 DIAGNOSIS — I442 Atrioventricular block, complete: Secondary | ICD-10-CM | POA: Insufficient documentation

## 2024-04-20 DIAGNOSIS — G4733 Obstructive sleep apnea (adult) (pediatric): Secondary | ICD-10-CM | POA: Insufficient documentation

## 2024-04-20 DIAGNOSIS — Z45018 Encounter for adjustment and management of other part of cardiac pacemaker: Secondary | ICD-10-CM | POA: Diagnosis not present

## 2024-04-20 LAB — BASIC METABOLIC PANEL WITH GFR
Anion gap: 11 (ref 5–15)
BUN: 32 mg/dL — ABNORMAL HIGH (ref 8–23)
CO2: 34 mmol/L — ABNORMAL HIGH (ref 22–32)
Calcium: 9.4 mg/dL (ref 8.9–10.3)
Chloride: 92 mmol/L — ABNORMAL LOW (ref 98–111)
Creatinine, Ser: 1.37 mg/dL — ABNORMAL HIGH (ref 0.61–1.24)
GFR, Estimated: 53 mL/min — ABNORMAL LOW (ref >60)
Glucose, Bld: 112 mg/dL — ABNORMAL HIGH (ref 70–99)
Potassium: 3.9 mmol/L (ref 3.5–5.1)
Sodium: 137 mmol/L (ref 135–145)

## 2024-04-20 LAB — BRAIN NATRIURETIC PEPTIDE: B Natriuretic Peptide: 98.9 pg/mL (ref 0.0–100.0)

## 2024-04-20 MED ORDER — TORSEMIDE 20 MG PO TABS: 40.0000 mg | ORAL_TABLET | Freq: Every day | ORAL | 1 refills | Status: DC

## 2024-04-20 NOTE — Progress Notes (Signed)
 Advanced [facility] Failure Clinic Note   PCP: Jaycee Greig PARAS, NP Cardiologist: Lurena MARLA Red, MD  HF Cardiologist: Dr. Cherrie  HPI: 78 y.o. male with history of CAD, chronic HFpEF with RV failure, DM, HTN, HLD, OSA and chronic respiratory failure on BiPAP, CHB s/p dual chamber PPM with LBBAP lead in 01/25, hx asbestos exposure with pleural plaque on prior HRCT but no ILD, remote tobacco use.    Appears to be evidence of RV dysfunction on echo dating back to 2019. L/RHC in 02/2018: Nonobstructive CAD involving RCA, PCWP mean 8, PA 46/16 (26), Fick CO/CI 4.45/2.07, TD CO/CI 3.64/1.69.    Admitted for acute decompensated HFpEF and RV failure 12/07/23. Echo showed EF 60-65%, interventricular septum is flattened, RV enlarged and HK, RVSP 58 mmHg, moderate TR. RHC showed RA 16, mPA 34, PCWP 11, TD CO/CI 3.8/1.8, PVR 6.0. Required ICU stay for BiPAP and inotrope/pressor support.   Seen by PCP 12/25/23. Noted to by hypotensive 74/38, asymptomatic. BUN >100. Referred to nutrition, ENT for dysphagia, and nephrology.   He returns today for heart failure follow up. Overall feeling fine. Wears 2L O2 and BiPAP at night. BLE edema is mildly improved, wearing compression socks and boot. Had debridement on 04/07/24. Did not want any further surgeries. Saw [facility] and was offered lower extremity angiogram, however patient declined invasive treatments. Followed by palliative care. Has wound care that comes to the house. States that the top is healing but the bottom of his foot is still healing. Chronically dizzy. Denies abnormal bleeding, CP or orthopnea. Able to perform ADLs. Appetite okay. Weight at home has been at 195 lbs. Compliant with all medications, except torsemide . He has only been taking this once a day, reports that he was up all night with twice a day.     Past Medical History:  Diagnosis Date   Diabetes mellitus    High cholesterol    Hypertension    Peripheral arterial disease    Current Outpatient  Medications  Medication Sig Dispense Refill   albuterol  (VENTOLIN  HFA) 108 (90 Base) MCG/ACT inhaler INHALE 2 PUFFS BY MOUTH EVERY 6 HOURS AS NEEDED FOR WHEEZING FOR SHORTNESS OF BREATH 9 g 5   amLODipine  (NORVASC ) 10 MG tablet Take 1 tablet (10 mg total) by mouth daily. 90 tablet 0   aspirin  EC 81 MG tablet Take 81 mg by mouth daily. Swallow whole.     atorvastatin  (LIPITOR) 40 MG tablet Take 40 mg by mouth daily.     blood glucose meter kit and supplies Dispense based on patient and insurance preference. Use up to four times daily as directed. (FOR ICD-10 E10.9, E11.9). 1 each 0   Cholecalciferol (VITAMIN D-3) 25 MCG (1000 UT) CAPS Take 1 capsule by mouth every morning.     empagliflozin  (JARDIANCE ) 10 MG TABS tablet Take 1 tablet (10 mg total) by mouth daily before breakfast. 90 tablet 3   ezetimibe  (ZETIA ) 10 MG tablet Take 1 tablet (10 mg total) by mouth daily. 90 tablet 3   gentamicin  ointment (GARAMYCIN ) 0.1 % Apply 1 Application topically daily. Apply to wound daily 30 g 0   Insulin  NPH, Human,, Isophane, (HUMULIN  N KWIKPEN) 100 UNIT/ML Kiwkpen Inject 33 Units into the skin in the morning and at bedtime. 60 mL 1   Insulin  Pen Needle (DROPLET PEN NEEDLES) 31G X 8 MM MISC 1 each by Other route 2 (two) times daily with a meal. 200 each 0   levothyroxine  (SYNTHROID ) 50 MCG tablet Take 1  tablet (50 mcg total) by mouth daily. 90 tablet 0   metFORMIN  (GLUCOPHAGE ) 1000 MG tablet Take 1 tablet (1,000 mg total) by mouth 2 (two) times daily with a meal. 180 tablet 0   nitroGLYCERIN  (NITROSTAT ) 0.4 MG SL tablet Place 1 tablet (0.4 mg total) under the tongue every 5 (five) minutes as needed for chest pain. 25 tablet 3   torsemide  (DEMADEX ) 20 MG tablet Take 2 tablets (40 mg total) by mouth 2 (two) times daily. (Patient taking differently: Take 40 mg by mouth daily.) 90 tablet 1   vitamin B-12 (CYANOCOBALAMIN ) 500 MCG tablet Take 500 mcg by mouth daily.     potassium chloride  SA (KLOR-CON  M) 20 MEQ  tablet Take 2 tablets (40 mEq total) by mouth daily. (Patient not taking: Reported on 04/20/2024) 180 tablet 1   spironolactone  (ALDACTONE ) 25 MG tablet Take 0.5 tablets (12.5 mg total) by mouth daily. (Patient not taking: Reported on 04/20/2024)     No current facility-administered medications for this encounter.   No Known Allergies  Social History   Socioeconomic History   Marital status: Married    Spouse name: Not on file   Number of children: Not on file   Years of education: Not on file   Highest education level: Not on file  Occupational History   Not on file  Tobacco Use   Smoking status: Former    Current packs/day: 0.00    Types: Cigarettes    Quit date: 03/30/1980    Years since quitting: 44.0    Passive exposure: Never   Smokeless tobacco: Current    Types: Chew, Snuff  Vaping Use   Vaping status: Never Used  Substance and Sexual Activity   Alcohol use: No   Drug use: No   Sexual activity: Not Currently  Other Topics Concern   Not on file  Social History Narrative   Not on file   Social Drivers of Health   Financial Resource Strain: Low Risk  (08/06/2023)   Overall Financial Resource Strain (CARDIA)    Difficulty of Paying Living Expenses: Not hard at all  Food Insecurity: No Food Insecurity (12/21/2023)   Hunger Vital Sign    Worried About Running Out of Food in the Last Year: Never true    Ran Out of Food in the Last Year: Never true  Transportation Needs: No Transportation Needs (12/21/2023)   PRAPARE - Administrator, Civil Service (Medical): No    Lack of Transportation (Non-Medical): No  Physical Activity: Sufficiently Active (08/06/2023)   Exercise Vital Sign    Days of Exercise per Week: 7 days    Minutes of Exercise per Session: 30 min  Stress: No Stress Concern Present (08/06/2023)   [employer] of Occupational Health - Occupational Stress Questionnaire    Feeling of Stress : Not at all  Social Connections: Moderately Isolated  (12/08/2023)   Social Connection and Isolation Panel    Frequency of Communication with Friends and Family: More than three times a week    Frequency of Social Gatherings with Friends and Family: More than three times a week    Attends Religious Services: Never    Database Administrator or Organizations: No    Attends Banker Meetings: Never    Marital Status: Married  Catering Manager Violence: Not At Risk (12/21/2023)   Humiliation, Afraid, Rape, and Kick questionnaire    Fear of Current or Ex-Partner: No    Emotionally Abused: No  Physically Abused: No    Sexually Abused: No    Family History  Problem Relation Age of Onset   Heart failure Mother    Cancer Father    Diabetes Sister    Cancer Sister    Wt Readings from Last 3 Encounters:  04/20/24 88.5 kg (195 lb)  04/07/24 90.3 kg (199 lb)  03/30/24 90.3 kg (199 lb)   BP (!) 118/50   Pulse 85   Ht 6' 1 (1.854 m)   Wt 88.5 kg (195 lb)   SpO2 98%   BMI 25.73 kg/m   PHYSICAL EXAM: General: Elderly appearing. No distress  Cardiac: JVP flat. No murmurs  Resp: Lung sounds clear, Diminished on RLL Extremities: Warm and dry.  3+ BLE edema. + compression socks  Neuro: A&O x3. Affect pleasant. Arrived by [facility].   ReDs reading: 30%, normal  St. Scientist, Product/process Development (personally reviewed): AF/AT burden <1%, VP 98%, no CorVue   ASSESSMENT & PLAN: End-Stage Chronic HFpEF / PAH / Cor Pulmonale - Evidence of RV dysfunction on echo dating back to 2019.  - L/RHC in 10/19: Nonobstructive CAD involving RCA, PCW 8, PA 46/16 (26), Fick CO/CI 4.45/2.07, TD CO/CI 3.64/1.69.  - PFTs in 2020 with mixed restrictive and obstructive defect. CT w/o evidence of ILD. - Echo 7/25: EF 60-65%, interventricular septum is flattened, RV enlarged and HK, RVSP 58 mmHg, mod TR - RHC 7/25: RA 16, PA mean 34, PCWP 11, TD CO/CI 3.8/1.8, unable to read Fick, PVR 6.0, PAPi 1.4 - Given severe CO2 retention suspect most WHO Group 3 disease but  may have component of WHO Group 1.  - Reports adherence with home BiPAP; On home O2 - NYHA IIIb. Volume mildly improved on exam, ReDs is down. - Urinating briskly with torsemide  40 daily, did not increase to twice daily, will not take PM dose - He is agreeable to taking an additional 40 mg torsemide  in the earlier afternoon if weight starts to rise. - Continue Jardiance  10 mg daily. May need to stop with LE wounds, reports that this is healing well.  - Off spiro with hyperkalemia. BMET/BNP today - Off lisinopril  and chlorthalidone  with hypotension - End stage PAH/cor pulmonale  2. CKD IIIb - Baseline SCr 1.3-1.4. - Continue SGLT2i for now  3. PAD - chronic osteomyelitis to R foot - Follows with podiatry and [facility] - Continue statin + ASA - Has Wound Clinic appointment for RLE foot ulcer   4. CHB  - s/p dual chamber PPM w/ LBBAP lead  5. Paroxysmal Atrial Flutter - Device interrogation as above - Off amiodarone  with pulmonary disease   6. 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 Failure to Thrive - Followed by [facility]  - Has home heath PT   Follow up in 2 months with APP  Vianna Venezia, NP 04/20/24

## 2024-04-20 NOTE — Patient Instructions (Addendum)
 Good to see you today!  Take torsemide  40 mg daily and may take an additional dose in the afternoon as needed  Labs done today, your results will be available in MyChart, we will contact you for abnormal readings.  Your physician recommends that you schedule a follow-up appointment as scheduled  If you have any questions or concerns before your next appointment please send us  a message through [employer] or call our office at [phone].    TO LEAVE A MESSAGE FOR THE NURSE SELECT OPTION 2, PLEASE LEAVE A MESSAGE INCLUDING: YOUR NAME DATE OF BIRTH CALL BACK NUMBER REASON FOR CALL**this is important as we prioritize the call backs  YOU WILL RECEIVE A CALL BACK THE SAME DAY AS LONG AS YOU CALL BEFORE 4:00 PM At the [facility], you and your health needs are our priority. As part of our continuing mission to provide you with exceptional heart care, we have created designated Provider Care Teams. These Care Teams include your primary Cardiologist (physician) and Advanced Practice Providers (APPs- Physician Assistants and Nurse Practitioners) who all work together to provide you with the care you need, when you need it.   You may see any of the following providers on your designated Care Team at your next follow up: Dr Toribio Fuel Dr Ezra Shuck Dr. Morene Brownie Greig Mosses, NP Caffie Shed, [place] [facility] [place], [place] Beckey Coe, NP Jordan Lee, NP Ellouise Class, NP Tinnie Redman, PharmD Jaun Bash, PharmD   Please be sure to bring in all your medications bottles to every appointment.    Thank you for choosing [facility]

## 2024-04-20 NOTE — Progress Notes (Signed)
 Pharmacy Quality Measure Review  This patient is appearing on a report for adherence measure for diabetes medications this calendar year.   Medication: empagliflozin  Last fill date: 04/15/24 for 30 day supply  No follow-up needed at this time.   Medication: metformin  Last fill date: 03/28/24 for 30 day supply  No follow-up needed at this time.   Herlene Fleeta Morris, PharmD, JAQUELINE, CPP Clinical Pharmacist [facility] [phone]

## 2024-04-27 ENCOUNTER — Other Ambulatory Visit: Payer: Self-pay | Admitting: Pharmacist

## 2024-04-27 NOTE — Progress Notes (Signed)
 Pharmacy Quality Measure Review  This patient is appearing on a report for adherence measure for diabetes medications this calendar year.   Medication: empagliflozin  Last fill date: 04/15/24 for 30 day supply  No follow-up needed at this time.   Medication: metformin  Last fill date: 04/25/2024 for 30 day supply  No follow-up need at this time.  Herlene Fleeta Morris, PharmD, JAQUELINE, CPP Clinical Pharmacist [facility] [phone]

## 2024-05-16 ENCOUNTER — Telehealth: Payer: Self-pay

## 2024-05-16 ENCOUNTER — Encounter: Payer: Self-pay | Admitting: Internal Medicine

## 2024-05-16 NOTE — Telephone Encounter (Signed)
 Copied from CRM #[id number]. Topic: Clinical - Medical Advice >> May 16, 2024  1:39 PM Amy B wrote: Reason for CRM: [facility] reports that the patient gained 15 pounds and now weighs 205 pounds.  Patient does not have any symptoms +2 lower extremity edema on both legs.  His right foot has yellow wound drainage on the lateral side.  They recommend cleaning with wound wash, honey to get rid of the infection.  Please call [phone]

## 2024-05-16 NOTE — Telephone Encounter (Signed)
 Please advise

## 2024-05-26 ENCOUNTER — Encounter (HOSPITAL_COMMUNITY): Payer: Self-pay

## 2024-05-26 ENCOUNTER — Emergency Department (HOSPITAL_COMMUNITY)

## 2024-05-26 ENCOUNTER — Other Ambulatory Visit: Payer: Self-pay

## 2024-05-26 ENCOUNTER — Inpatient Hospital Stay (HOSPITAL_COMMUNITY)
Admission: EM | Admit: 2024-05-26 | Discharge: 2024-05-31 | Disposition: A | Discharge status: Home / Self Care | Attending: Internal Medicine | Admitting: Internal Medicine

## 2024-05-26 DIAGNOSIS — Z79899 Other long term (current) drug therapy: Secondary | ICD-10-CM

## 2024-05-26 DIAGNOSIS — I2721 Secondary pulmonary arterial hypertension: Secondary | ICD-10-CM | POA: Diagnosis present

## 2024-05-26 DIAGNOSIS — Z9981 Dependence on supplemental oxygen: Secondary | ICD-10-CM

## 2024-05-26 DIAGNOSIS — I5033 Acute on chronic diastolic (congestive) heart failure: Principal | ICD-10-CM | POA: Diagnosis present

## 2024-05-26 DIAGNOSIS — N179 Acute kidney failure, unspecified: Secondary | ICD-10-CM | POA: Diagnosis present

## 2024-05-26 DIAGNOSIS — J9621 Acute and chronic respiratory failure with hypoxia: Secondary | ICD-10-CM | POA: Diagnosis present

## 2024-05-26 DIAGNOSIS — E039 Hypothyroidism, unspecified: Secondary | ICD-10-CM | POA: Diagnosis present

## 2024-05-26 DIAGNOSIS — Z794 Long term (current) use of insulin: Secondary | ICD-10-CM

## 2024-05-26 DIAGNOSIS — E1122 Type 2 diabetes mellitus with diabetic chronic kidney disease: Secondary | ICD-10-CM | POA: Diagnosis present

## 2024-05-26 DIAGNOSIS — I5030 Unspecified diastolic (congestive) heart failure: Secondary | ICD-10-CM | POA: Diagnosis present

## 2024-05-26 DIAGNOSIS — Z720 Tobacco use: Secondary | ICD-10-CM

## 2024-05-26 DIAGNOSIS — Z950 Presence of cardiac pacemaker: Secondary | ICD-10-CM

## 2024-05-26 DIAGNOSIS — E119 Type 2 diabetes mellitus without complications: Secondary | ICD-10-CM

## 2024-05-26 DIAGNOSIS — Z66 Do not resuscitate: Secondary | ICD-10-CM | POA: Diagnosis not present

## 2024-05-26 DIAGNOSIS — I2781 Cor pulmonale (chronic): Secondary | ICD-10-CM | POA: Diagnosis present

## 2024-05-26 DIAGNOSIS — I152 Hypertension secondary to endocrine disorders: Secondary | ICD-10-CM | POA: Diagnosis present

## 2024-05-26 DIAGNOSIS — N183 Chronic kidney disease, stage 3 unspecified: Secondary | ICD-10-CM | POA: Diagnosis present

## 2024-05-26 DIAGNOSIS — I2489 Other forms of acute ischemic heart disease: Secondary | ICD-10-CM | POA: Diagnosis present

## 2024-05-26 DIAGNOSIS — E782 Mixed hyperlipidemia: Secondary | ICD-10-CM | POA: Diagnosis present

## 2024-05-26 DIAGNOSIS — Z8249 Family history of ischemic heart disease and other diseases of the circulatory system: Secondary | ICD-10-CM

## 2024-05-26 DIAGNOSIS — J4489 Other specified chronic obstructive pulmonary disease: Secondary | ICD-10-CM | POA: Diagnosis present

## 2024-05-26 DIAGNOSIS — E1159 Type 2 diabetes mellitus with other circulatory complications: Secondary | ICD-10-CM | POA: Diagnosis present

## 2024-05-26 DIAGNOSIS — Z833 Family history of diabetes mellitus: Secondary | ICD-10-CM

## 2024-05-26 DIAGNOSIS — R7989 Other specified abnormal findings of blood chemistry: Secondary | ICD-10-CM | POA: Diagnosis present

## 2024-05-26 DIAGNOSIS — E1151 Type 2 diabetes mellitus with diabetic peripheral angiopathy without gangrene: Secondary | ICD-10-CM | POA: Diagnosis present

## 2024-05-26 DIAGNOSIS — Z79890 Hormone replacement therapy (postmenopausal): Secondary | ICD-10-CM

## 2024-05-26 DIAGNOSIS — I2510 Atherosclerotic heart disease of native coronary artery without angina pectoris: Secondary | ICD-10-CM | POA: Diagnosis present

## 2024-05-26 DIAGNOSIS — Z716 Tobacco abuse counseling: Secondary | ICD-10-CM

## 2024-05-26 DIAGNOSIS — Z7982 Long term (current) use of aspirin: Secondary | ICD-10-CM

## 2024-05-26 DIAGNOSIS — I509 Heart failure, unspecified: Principal | ICD-10-CM

## 2024-05-26 DIAGNOSIS — Z7984 Long term (current) use of oral hypoglycemic drugs: Secondary | ICD-10-CM

## 2024-05-26 LAB — TROPONIN T, HIGH SENSITIVITY: Troponin T High Sensitivity: 87 ng/L — ABNORMAL HIGH (ref 0–19)

## 2024-05-26 LAB — CBC WITH DIFFERENTIAL/PLATELET
Abs Immature Granulocytes: 0.03 K/uL (ref 0.00–0.07)
Basophils Absolute: 0 K/uL (ref 0.0–0.1)
Basophils Relative: 0 %
Eosinophils Absolute: 0.2 K/uL (ref 0.0–0.5)
Eosinophils Relative: 2 %
HCT: 39.3 % (ref 39.0–52.0)
Hemoglobin: 12.4 g/dL — ABNORMAL LOW (ref 13.0–17.0)
Immature Granulocytes: 0 %
Lymphocytes Relative: 10 %
Lymphs Abs: 1 K/uL (ref 0.7–4.0)
MCH: 29 pg (ref 26.0–34.0)
MCHC: 31.6 g/dL (ref 30.0–36.0)
MCV: 91.8 fL (ref 80.0–100.0)
Monocytes Absolute: 0.9 K/uL (ref 0.1–1.0)
Monocytes Relative: 9 %
Neutro Abs: 7.7 K/uL (ref 1.7–7.7)
Neutrophils Relative %: 79 %
Platelets: 215 K/uL (ref 150–400)
RBC: 4.28 MIL/uL (ref 4.22–5.81)
RDW: 13.1 % (ref 11.5–15.5)
WBC: 9.8 K/uL (ref 4.0–10.5)
nRBC: 0 % (ref 0.0–0.2)

## 2024-05-26 LAB — BASIC METABOLIC PANEL WITH GFR
Anion gap: 11 (ref 5–15)
BUN: 29 mg/dL — ABNORMAL HIGH (ref 8–23)
CO2: 41 mmol/L — ABNORMAL HIGH (ref 22–32)
Calcium: 9.5 mg/dL (ref 8.9–10.3)
Chloride: 88 mmol/L — ABNORMAL LOW (ref 98–111)
Creatinine, Ser: 1.26 mg/dL — ABNORMAL HIGH (ref 0.61–1.24)
GFR, Estimated: 59 mL/min — ABNORMAL LOW
Glucose, Bld: 115 mg/dL — ABNORMAL HIGH (ref 70–99)
Potassium: 4.2 mmol/L (ref 3.5–5.1)
Sodium: 140 mmol/L (ref 135–145)

## 2024-05-26 LAB — PRO BRAIN NATRIURETIC PEPTIDE: Pro Brain Natriuretic Peptide: 1659 pg/mL — ABNORMAL HIGH

## 2024-05-26 NOTE — ED Provider Triage Note (Signed)
 Emergency Medicine Provider Triage Evaluation Note  Robert Ramirez , a 79 y.o. male  was evaluated in triage.  Pt complains of worsening bilateral lower extremity swelling as well as shortness of breath.  Patient states that he has been taking his water  pill daily which normally helps with his swelling.  However, over the last 4 days the swelling in his lower extremities has gotten worse.  He states he does normally wear 2 L of oxygen , which is on right now but he states he shortness of breath has worsened due to fluid overload.  Review of Systems  Positive: Shortness of breath, lower extremity swelling Negative:   Physical Exam  BP (!) 148/97 (BP Location: Left Arm)   Pulse 88   Temp 97.7 F (36.5 C)   Resp 19   Ht 6' 1 (1.854 m)   Wt 93.4 kg   SpO2 91%   BMI 27.18 kg/m  Gen:   Awake, no distress,  Resp:  Normal effort, patient on 2 L nasal cannula MSK:   Moves extremities without difficulty.  Bilateral lower extremity swelling Other:    Medical Decision Making  Medically screening exam initiated at 3:26 PM.  Appropriate orders placed.  Robert Ramirez was informed that the remainder of the evaluation will be completed by another provider, this initial triage assessment does not replace that evaluation, and the importance of remaining in the ED until their evaluation is complete.  Labs, EKG, chest x-ray ordered.   Robert Marry RAMAN, PA-C 05/26/24 1531

## 2024-05-26 NOTE — ED Triage Notes (Signed)
 C/O bilateral leg swelling for 2 months intermittently. C/O SHOB 2 days. Pt wears 2L of O2 24/7. Denies CP. Pt gained 6lbs since yesterday. Pt took fluid pills this morning. Axox4.

## 2024-05-27 DIAGNOSIS — E782 Mixed hyperlipidemia: Secondary | ICD-10-CM | POA: Diagnosis present

## 2024-05-27 DIAGNOSIS — I5033 Acute on chronic diastolic (congestive) heart failure: Secondary | ICD-10-CM | POA: Diagnosis present

## 2024-05-27 DIAGNOSIS — I2781 Cor pulmonale (chronic): Secondary | ICD-10-CM | POA: Diagnosis present

## 2024-05-27 DIAGNOSIS — N179 Acute kidney failure, unspecified: Secondary | ICD-10-CM | POA: Diagnosis present

## 2024-05-27 DIAGNOSIS — Z7984 Long term (current) use of oral hypoglycemic drugs: Secondary | ICD-10-CM | POA: Diagnosis not present

## 2024-05-27 DIAGNOSIS — Z950 Presence of cardiac pacemaker: Secondary | ICD-10-CM | POA: Diagnosis not present

## 2024-05-27 DIAGNOSIS — I152 Hypertension secondary to endocrine disorders: Secondary | ICD-10-CM | POA: Diagnosis present

## 2024-05-27 DIAGNOSIS — Z8249 Family history of ischemic heart disease and other diseases of the circulatory system: Secondary | ICD-10-CM | POA: Diagnosis not present

## 2024-05-27 DIAGNOSIS — E1151 Type 2 diabetes mellitus with diabetic peripheral angiopathy without gangrene: Secondary | ICD-10-CM | POA: Diagnosis present

## 2024-05-27 DIAGNOSIS — Z833 Family history of diabetes mellitus: Secondary | ICD-10-CM | POA: Diagnosis not present

## 2024-05-27 DIAGNOSIS — Z7982 Long term (current) use of aspirin: Secondary | ICD-10-CM | POA: Diagnosis not present

## 2024-05-27 DIAGNOSIS — E1122 Type 2 diabetes mellitus with diabetic chronic kidney disease: Secondary | ICD-10-CM | POA: Diagnosis present

## 2024-05-27 DIAGNOSIS — Z79899 Other long term (current) drug therapy: Secondary | ICD-10-CM | POA: Diagnosis not present

## 2024-05-27 DIAGNOSIS — J9621 Acute and chronic respiratory failure with hypoxia: Secondary | ICD-10-CM | POA: Diagnosis present

## 2024-05-27 DIAGNOSIS — N183 Chronic kidney disease, stage 3 unspecified: Secondary | ICD-10-CM | POA: Diagnosis present

## 2024-05-27 DIAGNOSIS — Z79890 Hormone replacement therapy (postmenopausal): Secondary | ICD-10-CM | POA: Diagnosis not present

## 2024-05-27 DIAGNOSIS — I2510 Atherosclerotic heart disease of native coronary artery without angina pectoris: Secondary | ICD-10-CM | POA: Diagnosis present

## 2024-05-27 DIAGNOSIS — Z66 Do not resuscitate: Secondary | ICD-10-CM | POA: Diagnosis not present

## 2024-05-27 DIAGNOSIS — Z794 Long term (current) use of insulin: Secondary | ICD-10-CM | POA: Diagnosis not present

## 2024-05-27 DIAGNOSIS — J4489 Other specified chronic obstructive pulmonary disease: Secondary | ICD-10-CM | POA: Diagnosis present

## 2024-05-27 DIAGNOSIS — I2721 Secondary pulmonary arterial hypertension: Secondary | ICD-10-CM | POA: Diagnosis present

## 2024-05-27 DIAGNOSIS — Z720 Tobacco use: Secondary | ICD-10-CM | POA: Diagnosis not present

## 2024-05-27 DIAGNOSIS — I2489 Other forms of acute ischemic heart disease: Secondary | ICD-10-CM | POA: Diagnosis present

## 2024-05-27 DIAGNOSIS — E039 Hypothyroidism, unspecified: Secondary | ICD-10-CM | POA: Diagnosis present

## 2024-05-27 LAB — CBC WITH DIFFERENTIAL/PLATELET
Abs Immature Granulocytes: 0.03 K/uL (ref 0.00–0.07)
Basophils Absolute: 0 K/uL (ref 0.0–0.1)
Basophils Relative: 1 %
Eosinophils Absolute: 0.2 K/uL (ref 0.0–0.5)
Eosinophils Relative: 3 %
HCT: 38.9 % — ABNORMAL LOW (ref 39.0–52.0)
Hemoglobin: 12.5 g/dL — ABNORMAL LOW (ref 13.0–17.0)
Immature Granulocytes: 0 %
Lymphocytes Relative: 10 %
Lymphs Abs: 0.8 K/uL (ref 0.7–4.0)
MCH: 29.3 pg (ref 26.0–34.0)
MCHC: 32.1 g/dL (ref 30.0–36.0)
MCV: 91.1 fL (ref 80.0–100.0)
Monocytes Absolute: 0.9 K/uL (ref 0.1–1.0)
Monocytes Relative: 11 %
Neutro Abs: 6.1 K/uL (ref 1.7–7.7)
Neutrophils Relative %: 75 %
Platelets: 199 K/uL (ref 150–400)
RBC: 4.27 MIL/uL (ref 4.22–5.81)
RDW: 13.3 % (ref 11.5–15.5)
WBC: 8.1 K/uL (ref 4.0–10.5)
nRBC: 0 % (ref 0.0–0.2)

## 2024-05-27 LAB — GLUCOSE, CAPILLARY
Glucose-Capillary: 179 mg/dL — ABNORMAL HIGH (ref 70–99)
Glucose-Capillary: 173 mg/dL — ABNORMAL HIGH (ref 70–99)

## 2024-05-27 LAB — COMPREHENSIVE METABOLIC PANEL WITH GFR
ALT: 11 U/L (ref 0–44)
AST: 22 U/L (ref 15–41)
Albumin: 3.8 g/dL (ref 3.5–5.0)
Alkaline Phosphatase: 146 U/L — ABNORMAL HIGH (ref 38–126)
Anion gap: 10 (ref 5–15)
BUN: 29 mg/dL — ABNORMAL HIGH (ref 8–23)
CO2: 41 mmol/L — ABNORMAL HIGH (ref 22–32)
Calcium: 9.6 mg/dL (ref 8.9–10.3)
Chloride: 91 mmol/L — ABNORMAL LOW (ref 98–111)
Creatinine, Ser: 1.25 mg/dL — ABNORMAL HIGH (ref 0.61–1.24)
GFR, Estimated: 59 mL/min — ABNORMAL LOW
Glucose, Bld: 136 mg/dL — ABNORMAL HIGH (ref 70–99)
Potassium: 3.5 mmol/L (ref 3.5–5.1)
Sodium: 142 mmol/L (ref 135–145)
Total Bilirubin: 1 mg/dL (ref 0.0–1.2)
Total Protein: 6.7 g/dL (ref 6.5–8.1)

## 2024-05-27 LAB — PROTIME-INR
INR: 1 (ref 0.8–1.2)
Prothrombin Time: 14.1 s (ref 11.4–15.2)

## 2024-05-27 LAB — MAGNESIUM
Magnesium: 1.6 mg/dL — ABNORMAL LOW (ref 1.7–2.4)
Magnesium: 1.5 mg/dL — ABNORMAL LOW (ref 1.7–2.4)

## 2024-05-27 LAB — TROPONIN T, HIGH SENSITIVITY: Troponin T High Sensitivity: 92 ng/L — ABNORMAL HIGH (ref 0–19)

## 2024-05-27 LAB — PRO BRAIN NATRIURETIC PEPTIDE: Pro Brain Natriuretic Peptide: 1364 pg/mL — ABNORMAL HIGH

## 2024-05-27 LAB — PHOSPHORUS: Phosphorus: 3.4 mg/dL (ref 2.5–4.6)

## 2024-05-27 MED ORDER — SODIUM CHLORIDE 0.9% FLUSH
3.0000 mL | Freq: Two times a day (BID) | INTRAVENOUS | Status: DC
  Administered 2024-05-27 – 2024-05-31 (×9): 3 mL via INTRAVENOUS

## 2024-05-27 MED ORDER — ONDANSETRON HCL 4 MG PO TABS: 4.0000 mg | ORAL_TABLET | Freq: Four times a day (QID) | ORAL | Status: DC | PRN

## 2024-05-27 MED ORDER — EZETIMIBE 10 MG PO TABS
10.0000 mg | ORAL_TABLET | Freq: Every day | ORAL | Status: DC
  Administered 2024-05-27 – 2024-05-31 (×5): 10 mg via ORAL
  Filled 2024-05-27 (×5): qty 1

## 2024-05-27 MED ORDER — VITAMIN D 25 MCG (1000 UNIT) PO TABS
1000.0000 [IU] | ORAL_TABLET | Freq: Every morning | ORAL | Status: DC
  Administered 2024-05-27 – 2024-05-31 (×5): 1000 [IU] via ORAL
  Filled 2024-05-27 (×5): qty 1

## 2024-05-27 MED ORDER — ASPIRIN 81 MG PO TBEC
81.0000 mg | DELAYED_RELEASE_TABLET | Freq: Every day | ORAL | Status: DC
  Administered 2024-05-27 – 2024-05-31 (×5): 81 mg via ORAL
  Filled 2024-05-27 (×5): qty 1

## 2024-05-27 MED ORDER — SENNOSIDES-DOCUSATE SODIUM 8.6-50 MG PO TABS: 1.0000 | ORAL_TABLET | Freq: Every evening | ORAL | Status: DC | PRN

## 2024-05-27 MED ORDER — LEVOTHYROXINE SODIUM 50 MCG PO TABS
50.0000 ug | ORAL_TABLET | Freq: Every day | ORAL | Status: DC
  Administered 2024-05-28 – 2024-05-31 (×4): 50 ug via ORAL
  Filled 2024-05-27 (×4): qty 1

## 2024-05-27 MED ORDER — FUROSEMIDE 10 MG/ML IJ SOLN
40.0000 mg | INTRAMUSCULAR | Status: AC
  Administered 2024-05-27: 40 mg via INTRAVENOUS
  Filled 2024-05-27: qty 4

## 2024-05-27 MED ORDER — HEPARIN SODIUM (PORCINE) 5000 UNIT/ML IJ SOLN: 5000.0000 [IU] | Freq: Three times a day (TID) | INTRAMUSCULAR | Status: DC

## 2024-05-27 MED ORDER — FLEET ENEMA RE ENEM: 1.0000 | ENEMA | Freq: Once | RECTAL | Status: DC | PRN

## 2024-05-27 MED ORDER — FUROSEMIDE 10 MG/ML IJ SOLN
40.0000 mg | Freq: Three times a day (TID) | INTRAMUSCULAR | Status: DC
  Administered 2024-05-27 – 2024-05-30 (×10): 40 mg via INTRAVENOUS
  Filled 2024-05-27 (×10): qty 4

## 2024-05-27 MED ORDER — MAGNESIUM SULFATE 2 GM/50ML IV SOLN
2.0000 g | Freq: Once | INTRAVENOUS | Status: AC
  Administered 2024-05-27: 2 g via INTRAVENOUS
  Filled 2024-05-27: qty 50

## 2024-05-27 MED ORDER — ATORVASTATIN CALCIUM 40 MG PO TABS
40.0000 mg | ORAL_TABLET | Freq: Every day | ORAL | Status: DC
  Administered 2024-05-27 – 2024-05-31 (×5): 40 mg via ORAL
  Filled 2024-05-27 (×5): qty 1

## 2024-05-27 MED ORDER — ACETAMINOPHEN 650 MG RE SUPP: 650.0000 mg | Freq: Four times a day (QID) | RECTAL | Status: DC | PRN

## 2024-05-27 MED ORDER — BISACODYL 5 MG PO TBEC: 5.0000 mg | DELAYED_RELEASE_TABLET | Freq: Every day | ORAL | Status: DC | PRN

## 2024-05-27 MED ORDER — HYDRALAZINE HCL 20 MG/ML IJ SOLN: 10.0000 mg | INTRAMUSCULAR | Status: DC | PRN

## 2024-05-27 MED ORDER — ACETAMINOPHEN 325 MG PO TABS: 650.0000 mg | ORAL_TABLET | Freq: Four times a day (QID) | ORAL | Status: DC | PRN

## 2024-05-27 MED ORDER — SODIUM CHLORIDE 0.9% FLUSH
3.0000 mL | Freq: Two times a day (BID) | INTRAVENOUS | Status: DC
  Administered 2024-05-27 – 2024-05-31 (×8): 3 mL via INTRAVENOUS

## 2024-05-27 MED ORDER — MELATONIN 3 MG PO TABS: 3.0000 mg | ORAL_TABLET | Freq: Every evening | ORAL | Status: DC | PRN

## 2024-05-27 MED ORDER — AMLODIPINE BESYLATE 10 MG PO TABS
10.0000 mg | ORAL_TABLET | Freq: Every day | ORAL | Status: DC
  Administered 2024-05-27 – 2024-05-28 (×2): 10 mg via ORAL
  Filled 2024-05-27: qty 1
  Filled 2024-05-27: qty 2

## 2024-05-27 MED ORDER — METFORMIN HCL 500 MG PO TABS
1000.0000 mg | ORAL_TABLET | Freq: Two times a day (BID) | ORAL | Status: DC
  Administered 2024-05-27: 1000 mg via ORAL
  Filled 2024-05-27: qty 2

## 2024-05-27 MED ORDER — ONDANSETRON HCL 4 MG/2ML IJ SOLN: 4.0000 mg | Freq: Four times a day (QID) | INTRAMUSCULAR | Status: DC | PRN

## 2024-05-27 MED ORDER — EMPAGLIFLOZIN 10 MG PO TABS
10.0000 mg | ORAL_TABLET | Freq: Every day | ORAL | Status: DC
  Administered 2024-05-28 – 2024-05-31 (×4): 10 mg via ORAL
  Filled 2024-05-27 (×4): qty 1

## 2024-05-27 MED ORDER — TRAZODONE HCL 50 MG PO TABS: 25.0000 mg | ORAL_TABLET | Freq: Every evening | ORAL | Status: DC | PRN

## 2024-05-27 MED ORDER — INSULIN ISOPHANE HUMAN 100 UNIT/ML KWIKPEN: 20.0000 [IU] | PEN_INJECTOR | Freq: Two times a day (BID) | SUBCUTANEOUS | Status: DC

## 2024-05-27 MED ORDER — INSULIN NPH (HUMAN) (ISOPHANE) 100 UNIT/ML ~~LOC~~ SUSP
20.0000 [IU] | Freq: Two times a day (BID) | SUBCUTANEOUS | Status: DC
  Administered 2024-05-27 – 2024-05-31 (×8): 20 [IU] via SUBCUTANEOUS
  Filled 2024-05-27: qty 10

## 2024-05-27 MED ORDER — INSULIN ASPART 100 UNIT/ML IJ SOLN
0.0000 [IU] | Freq: Three times a day (TID) | INTRAMUSCULAR | Status: DC
  Administered 2024-05-28 (×2): 3 [IU] via SUBCUTANEOUS
  Administered 2024-05-29: 2 [IU] via SUBCUTANEOUS
  Administered 2024-05-29: 5 [IU] via SUBCUTANEOUS
  Administered 2024-05-29 – 2024-05-30 (×3): 2 [IU] via SUBCUTANEOUS
  Administered 2024-05-30: 8 [IU] via SUBCUTANEOUS
  Administered 2024-05-31: 3 [IU] via SUBCUTANEOUS
  Filled 2024-05-27 (×2): qty 3
  Filled 2024-05-27: qty 2
  Filled 2024-05-27: qty 15
  Filled 2024-05-27: qty 2
  Filled 2024-05-27: qty 5
  Filled 2024-05-27: qty 2
  Filled 2024-05-27: qty 3

## 2024-05-27 MED ORDER — OXYCODONE HCL 5 MG PO TABS: 5.0000 mg | ORAL_TABLET | ORAL | Status: DC | PRN

## 2024-05-27 MED ORDER — IPRATROPIUM BROMIDE 0.02 % IN SOLN: 0.5000 mg | Freq: Four times a day (QID) | RESPIRATORY_TRACT | Status: DC | PRN

## 2024-05-27 MED ORDER — HEPARIN SODIUM (PORCINE) 5000 UNIT/ML IJ SOLN
5000.0000 [IU] | Freq: Three times a day (TID) | INTRAMUSCULAR | Status: DC
  Administered 2024-05-27 – 2024-05-31 (×12): 5000 [IU] via SUBCUTANEOUS
  Filled 2024-05-27 (×12): qty 1

## 2024-05-27 MED ORDER — HYDROMORPHONE HCL 1 MG/ML IJ SOLN: 0.5000 mg | INTRAMUSCULAR | Status: DC | PRN

## 2024-05-27 NOTE — Assessment & Plan Note (Signed)
 Monitoring BUN/creatinine closely Lab Results  Component Value Date   CREATININE 1.25 (H) 05/27/2024   CREATININE 1.26 (H) 05/26/2024   CREATININE 1.37 (H) 04/20/2024   Creatinine at baseline 1.25, BUN 29, GFR 59

## 2024-05-27 NOTE — Hospital Course (Addendum)
 Robert Ramirez is a 79 year old male with extensive history of diastolic heart failure, chronic respiratory failure: On 2 L, HLD, HTN, DM2, PAD, hypothyroidism,.. Presented with progressive shortness of breath.   Presented with acute on chronic respiratory failure after 2 to 3 days of progressive shortness of breath, cough.  Compliant with diuretics including Demadex , also took an extra dose on the day of admission.  With no improvement of symptoms. Denies having of any fever or chills or cough.   ED Evaluation: Blood pressure (!) 125/103, pulse 86, temperature (!) 97.4 F (36.3 C), resp. rate 20, height 6' 1 (1.854 m), weight 93.4 kg, SpO2 93% on 3 L of oxygen , Labs: BNP > 1659.0 >>> 1364.0, troponin 87, 92, creatinine 1.25, BUN 29, glucose 136, CO2 41, chloride 91  EKG: No acute changes, rate of 86, QTc 516, Chest x-ray: Possible congestion, edema consistent with CHF   In ED received 40 mg Lasix ,

## 2024-05-27 NOTE — H&P (Addendum)
 " History and Physical   Patient: Robert Ramirez                            PCP: Jaycee Greig PARAS, NP                    DOB: 1945/09/09            DOA: 05/26/2024 FMW:[id number]             DOS: 05/27/2024, 5:21 PM  Jaycee Greig PARAS, NP  Patient coming from:   HOME  I have personally reviewed patient's medical records, in electronic medical records, including:  [facility], and care everywhere.    Chief Complaint:   No chief complaint on file.   History of present illness:   Robert Ramirez is a 79 year old male with extensive history of diastolic heart failure, chronic respiratory failure: On 2 L, HLD, HTN, DM2, PAD, hypothyroidism,.. Presented with progressive shortness of breath.   Presented with acute on chronic respiratory failure after 2 to 3 days of progressive shortness of breath, cough.  Compliant with diuretics including Demadex , also took an extra dose on the day of admission.  With no improvement of symptoms. Denies having of any fever or chills or cough.   ED Evaluation: Blood pressure (!) 125/103, pulse 86, temperature (!) 97.4 F (36.3 C), resp. rate 20, height 6' 1 (1.854 m), weight 93.4 kg, SpO2 93% on 3 L of oxygen , Labs: BNP > 1659.0 >>> 1364.0, troponin 87, 92, creatinine 1.25, BUN 29, glucose 136, CO2 41, chloride 91  EKG: No acute changes, rate of 86, QTc 516, Chest x-ray: Possible congestion, edema consistent with CHF   In ED received 40 mg Lasix ,       Patient Denies having: Fever, Chills, Cough, Chest Pain, Abd pain, N/V/D, headache, dizziness, lightheadedness,  Dysuria, Joint pain, rash, open wounds     Review of Systems: As per HPI, otherwise 10 point review of systems were negative.   ----------------------------------------------------------------------------------------------------------------------  Allergies[1]  Home MEDs:  Prior to Admission medications  Medication Sig Start Date End Date Taking? Authorizing Provider  albuterol   (VENTOLIN  HFA) 108 (90 Base) MCG/ACT inhaler INHALE 2 PUFFS BY MOUTH EVERY 6 HOURS AS NEEDED FOR WHEEZING FOR SHORTNESS OF BREATH 04/11/24  Yes Hope Almarie ORN, NP  amLODipine  (NORVASC ) 10 MG tablet Take 1 tablet (10 mg total) by mouth daily. 04/06/24 07/05/24 Yes Massey, Amy J, NP  aspirin  EC 81 MG tablet Take 81 mg by mouth daily. Swallow whole.   Yes [provider]  atorvastatin  (LIPITOR) 40 MG tablet Take 40 mg by mouth daily.   Yes [provider]  Cholecalciferol (VITAMIN D-3) 25 MCG (1000 UT) CAPS Take 1 capsule by mouth every morning.   Yes [provider]  empagliflozin  (JARDIANCE ) 10 MG TABS tablet Take 1 tablet (10 mg total) by mouth daily before breakfast. 05/07/23  Yes Thukkani, Arun K, MD  ezetimibe  (ZETIA ) 10 MG tablet Take 1 tablet (10 mg total) by mouth daily. 11/05/23  Yes Thukkani, Arun K, MD  gentamicin  ointment (GARAMYCIN ) 0.1 % Apply 1 Application topically daily. Apply to wound daily 02/04/24  Yes McDonald, Juliene SAUNDERS, DPM  Insulin  NPH, Human,, Isophane, (HUMULIN  N KWIKPEN) 100 UNIT/ML Kiwkpen Inject 33 Units into the skin in the morning and at bedtime. 10/02/23  Yes Newlin, Enobong, MD  levothyroxine  (SYNTHROID ) 50 MCG tablet Take 1 tablet (50 mcg total) by mouth daily.  12/25/23 03/30/25 Yes Massey, Amy J, NP  metFORMIN  (GLUCOPHAGE ) 1000 MG tablet Take 1 tablet (1,000 mg total) by mouth 2 (two) times daily with a meal. 12/25/23 03/30/25 Yes Massey, Amy J, NP  torsemide  (DEMADEX ) 20 MG tablet Take 2 tablets (40 mg total) by mouth daily. May take additional torsemide  dose in the afternoon as needed 04/20/24  Yes Lee, Jordan, NP  vitamin B-12 (CYANOCOBALAMIN ) 500 MCG tablet Take 500 mcg by mouth daily.   Yes [provider]  blood glucose meter kit and supplies Dispense based on patient and insurance preference. Use up to four times daily as directed. (FOR ICD-10 E10.9, E11.9). 02/08/22   Jaycee Greig PARAS, NP  Insulin  Pen Needle (DROPLET PEN NEEDLES) 31G X 8 MM  MISC 1 each by Other route 2 (two) times daily with a meal. 08/04/22   Jaycee Greig PARAS, NP  nitroGLYCERIN  (NITROSTAT ) 0.4 MG SL tablet Place 1 tablet (0.4 mg total) under the tongue every 5 (five) minutes as needed for chest pain. Patient not taking: Reported on 05/27/2024 03/29/21   Thukkani, Arun K, MD    PRN MEDs: acetaminophen  **OR** acetaminophen , bisacodyl, hydrALAZINE, HYDROmorphone (DILAUDID) injection, ipratropium, melatonin, ondansetron  **OR** ondansetron  (ZOFRAN ) IV, oxyCODONE, senna-docusate, sodium phosphate, traZODone  Past Medical History:  Diagnosis Date   Diabetes mellitus    High cholesterol    Hypertension    Peripheral arterial disease     Past Surgical History:  Procedure Laterality Date   APPENDECTOMY     ARTERIAL LINE INSERTION N/A 12/09/2023   Procedure: ARTERIAL LINE INSERTION;  Surgeon: Cherrie Toribio SAUNDERS, MD;  Location: [facility]AS[facility];  Service: Cardiovascular;  Laterality: N/A;   CENTRAL LINE INSERTION  12/09/2023   Procedure: CENTRAL LINE INSERTION;  Surgeon: Cherrie Toribio SAUNDERS, MD;  Location: [facility];  Service: Cardiovascular;;   PACEMAKER IMPLANT N/A 06/12/2023   Procedure: PACEMAKER IMPLANT;  Surgeon: Kennyth Chew, MD;  [facility] INV[facility]IVE CV [facility];  Service: Cardiovascular;  Laterality: N/A;   RIGHT HEART CATH N/A 12/09/2023   Procedure: RIGHT HEART CATH;  Surgeon: Cherrie Toribio SAUNDERS, MD;  Location: [facility] INVASIVE [facility];  Service: Cardiovascular;  Laterality: N/A;   RIGHT/LEFT HEART CATH AND CORONARY ANGIOGRAPHY N/A 03/17/2018   Procedure: RIGHT/LEFT HEART CATH AND CORONARY ANGIOGRAPHY;  Surgeon: Claudene Pacific, MD;  Location: [facility] INVASIVE [facility];  Service: Cardiovascular;  Laterality: N/A;     reports that he quit smoking about 44 years ago. His smoking use included cigarettes. He has never been exposed to tobacco smoke. His smokeless tobacco use includes chew and snuff. He reports that he does not drink alcohol and does not use drugs.   Family  History  Problem Relation Age of Onset   Heart failure Mother    Cancer Father    Diabetes Sister    Cancer Sister     Physical Exam:   Vitals:   05/27/24 1200 05/27/24 1207 05/27/24 1344 05/27/24 1610  BP: (!) 153/76  (!) 151/87   Pulse: (!) 110 99 95 93  Resp: 20 18  16   Temp:   97.7 F (36.5 C) 97.8 F (36.6 C)  TempSrc:   Oral Oral  SpO2: 92% 96% (!) 81% 90%  Weight:   89.6 kg   Height:   6' 1 (1.854 m)    Constitutional: NAD, calm, comfortable Eyes: PERRL, lids and conjunctivae normal ENMT: Mucous membranes are moist. Posterior pharynx clear of any exudate or lesions.Normal dentition.  Neck: normal, supple, no masses,  no thyromegaly Respiratory: clear to auscultation bilaterally, no wheezing, no crackles. Normal respiratory effort. No accessory muscle use.  Cardiovascular: Regular rate and rhythm, no murmurs / rubs / gallops. No extremity edema. 2+ pedal pulses. No carotid bruits.  Abdomen: no tenderness, no masses palpated. No hepatosplenomegaly. Bowel sounds positive.  Musculoskeletal: no clubbing / cyanosis. No joint deformity upper and lower extremities. Good ROM, no contractures. Normal muscle tone.  Neurologic: CN II-XII grossly intact. Sensation intact, DTR normal. Strength 5/5 in all 4.  Psychiatric: Normal judgment and insight. Alert and oriented x 3. Normal mood.  Skin: no rashes, lesions, ulcers. No induration   Wound 05/27/24 1400 Pressure Injury Coccyx Stage 1 -  Intact skin with non-blanchable redness of a localized area usually over a bony prominence. (Active)         Labs on admission:    I have personally reviewed following labs and imaging studies  CBC: Recent Labs  Lab 05/26/24 1605 05/27/24 0500  WBC 9.8 8.1  NEUTROABS 7.7 6.1  HGB 12.4* 12.5*  HCT 39.3 38.9*  MCV 91.8 91.1  PLT 215 199   Basic Metabolic Panel: Recent Labs  Lab 05/26/24 1605 05/27/24 0500 05/27/24 0824  NA 140 142  --   K 4.2 3.5  --   CL 88* 91*  --   CO2  41* 41*  --   GLUCOSE 115* 136*  --   BUN 29* 29*  --   CREATININE 1.26* 1.25*  --   CALCIUM  9.5 9.6  --   MG  --  1.6* 1.5*  PHOS  --   --  3.4   GFR: Estimated Creatinine Clearance: 55.9 mL/min (A) (by C-G formula based on SCr of 1.25 mg/dL (H)). Liver Function Tests: Recent Labs  Lab 05/27/24 0500  AST 22  ALT 11  ALKPHOS 146*  BILITOT 1.0  PROT 6.7  ALBUMIN 3.8    BNP (last 3 results) Recent Labs    05/26/24 1605 05/27/24 0500  PROBNP 1,659.0* 1,364.0*    Urine analysis:    Component Value Date/Time   COLORURINE YELLOW 12/07/2023 0839   APPEARANCEUR CLEAR 12/07/2023 0839   LABSPEC 1.022 12/07/2023 0839   PHURINE 5.0 12/07/2023 0839   GLUCOSEU >=500 (A) 12/07/2023 0839   HGBUR NEGATIVE 12/07/2023 0839   BILIRUBINUR NEGATIVE 12/07/2023 0839   KETONESUR NEGATIVE 12/07/2023 0839   PROTEINUR NEGATIVE 12/07/2023 0839   NITRITE NEGATIVE 12/07/2023 0839   LEUKOCYTESUR NEGATIVE 12/07/2023 0839    Last A1C:  Lab Results  Component Value Date   HGBA1C 8.9 (H) 12/25/2023     Radiologic Exams on Admission:   DG Chest 2 View Result Date: 05/26/2024 CLINICAL DATA:  Shortness of breath, swelling. EXAM: CHEST - 2 VIEW COMPARISON:  12/09/2023 FINDINGS: Dual lead left-sided pacemaker in place. The heart is enlarged but stable. Sequela of prior granulomatous disease with calcified hilar lymph nodes and scattered granuloma. Small to moderate size right pleural effusion with ill-defined opacity at the right lung base. Small left pleural effusion. No pneumothorax. No convincing pulmonary edema. There are calcified pleural plaques that were better assessed on prior CT. IMPRESSION: 1. Small to moderate size right pleural effusion with ill-defined opacity at the right lung base, favor scarring when compared with prior CT. 2. Small left pleural effusion. 3. Stable cardiomegaly. No convincing pulmonary edema. Electronically Signed   By: Andrea Gasman M.D.   On: 05/26/2024 18:10     EKG:   Independently reviewed.  Orders placed or performed  during the hospital encounter of 05/26/24   ED EKG   ED EKG   EKG 12-Lead   EKG   EKG   ---------------------------------------------------------------------------------------------------------------------------------------    Assessment / Plan:   Principal Problem:   Acute on chronic diastolic heart failure (HCC) Active Problems:   Elevated troponin   Hypertension associated with diabetes (HCC)   Diabetes (HCC)   Chronic obstructive asthma (with obstructive pulmonary disease) (HCC)   Coronary artery disease involving native coronary artery of native heart without angina pectoris   Chewing tobacco use   Acquired hypothyroidism   Acute kidney injury superimposed on chronic kidney disease   History of permanent cardiac pacemaker placement   (HFpEF) heart failure with preserved ejection fraction (HCC)   Mixed hyperlipidemia   Assessment and Plan: * Acute on chronic diastolic heart failure (HCC) HFpEF Home medication of diuretics already on hold (20 mg daily) - BNP 1659.0 >> 1364.0  POA: weight 93.4, (BMI 27.18)  -Initiating Lasix  40 mg IV twice daily -monitoring and strict I's and O, daily weight -  - Last echo 12/07/2023, EF 60 to 65%, normal LV function, mild LVH, right RV overload, mildly increased in size, moderate elevated pulmonary artery hypertension degenerative mitral valve no MR, all other valves within normal limit  Elevated troponin - Denies any chest pain, - Troponin , at 87, 92  - no acute changes on EKG -Monitor closely: As needed nitroglycerin , as needed analgesics  History of permanent cardiac pacemaker placement Noted-functional  Acute kidney injury superimposed on chronic kidney disease Monitoring BUN/creatinine closely Lab Results  Component Value Date   CREATININE 1.25 (H) 05/27/2024   CREATININE 1.26 (H) 05/26/2024   CREATININE 1.37 (H) 04/20/2024   Creatinine at baseline  1.25, BUN 29, GFR 59  Acquired hypothyroidism Home dose Synthroid , check TSH  Chewing tobacco use Counseled regarding tobacco use and abuse  Coronary artery disease involving native coronary artery of native heart without angina pectoris Continue aspirin , Lipitor, statins, Zetia  try to control glycemic control   Chronic obstructive asthma (with obstructive pulmonary disease) (HCC) Today on 2 L of O2 via nasal cannula, currently requiring 3 L, with some shortness of breath, with some volume overload Previous outpatient workup ruled out IDL, chronic obstructive asthma noted Continue as needed DuoNebs  Diabetes (HCC) On Jardiance , and Humulin -[employer] metformin  -Check an A1c -Check CBG q. ACHS, SSI coverage   Hypertension associated with diabetes (HCC) Currently well-controlled, continue home medication  Mixed hyperlipidemia Continue statin    Consults called:  none -------------------------------------------------------------------------------------------------------------------------------------------- DVT prophylaxis:  heparin  injection 5,000 Units Start: 05/27/24 1400 SCDs Start: 05/27/24 0824 SCDs Start: 05/27/24 0435   Code Status:   Code Status: Full Code   Admission status: Patient will be admitted as Inpatient, with a greater than 2 midnight length of stay. Level of care: Progressive   Family Communication:  none at bedside  (The above findings and plan of care has been discussed with patient in detail, the patient expressed understanding and agreement of above plan)  --------------------------------------------------------------------------------------------------------------------------------------------------  Disposition Plan:  Anticipated 1-2 days Status is: Inpatient Remains inpatient appropriate because: Continue IV diuretic, close monitoring, supplemental  oxygen      ----------------------------------------------------------------------------------------------------------------------------------------------------  Time spent:  34  Min.  Was spent seeing and evaluating the patient, reviewing all medical records, drawn plan of care.  SIGNED: Adriana DELENA Grams, MD, FHM. FAAFP. [place] [facility], Pager  (Please use [email or web address] to page/ or secure chat through epic) If 7PM-7AM, please contact night-coverage [email or web address],  05/27/2024, 5:21 PM     [1] No Known Allergies  "

## 2024-05-27 NOTE — Assessment & Plan Note (Addendum)
 HFpEF Home medication of diuretics already on hold (20 mg daily) - BNP 1659.0 >> 1364.0  POA: weight 93.4, (BMI 27.18)  -Initiating Lasix  40 mg IV twice daily -monitoring and strict I's and O, daily weight -  - Last echo 12/07/2023, EF 60 to 65%, normal LV function, mild LVH, right RV overload, mildly increased in size, moderate elevated pulmonary artery hypertension degenerative mitral valve no MR, all other valves within normal limit

## 2024-05-27 NOTE — Progress Notes (Signed)
" °   05/27/24 1421  TOC Brief Assessment  Insurance and Status Reviewed  Patient has primary care physician Yes  Home environment has been reviewed home w/ stepson  Prior level of function: independent  Prior/Current Home Services No current home services  Social Drivers of Health Review SDOH reviewed no interventions necessary  Readmission risk has been reviewed Yes  Transition of care needs no transition of care needs at this time    "

## 2024-05-27 NOTE — Assessment & Plan Note (Signed)
 Today on 2 L of O2 via nasal cannula, currently requiring 3 L, with some shortness of breath, with some volume overload Previous outpatient workup ruled out IDL, chronic obstructive asthma noted Continue as needed DuoNebs

## 2024-05-27 NOTE — Assessment & Plan Note (Addendum)
 Continue aspirin , Lipitor, statins, Zetia  try to control glycemic control

## 2024-05-27 NOTE — Assessment & Plan Note (Signed)
"   Continue statin   "

## 2024-05-27 NOTE — Progress Notes (Signed)
 Heart Failure Navigator Progress Note  Assessed for Heart & Vascular TOC clinic readiness.  Patient does not meet criteria due to he is an Advanced Heart Failure Team patient of Dr. Bensimhon.,.   Navigator will sign off at this time.   Stephane Haddock, BSN, Scientist, Clinical (histocompatibility And Immunogenetics) Only

## 2024-05-27 NOTE — Assessment & Plan Note (Signed)
 On Jardiance , and Humulin -[employer] metformin  -Check an A1c -Check CBG q. ACHS, SSI coverage

## 2024-05-27 NOTE — Consult Note (Addendum)
 WOC Nurse Consult Note: patient is followed by podiatry for R foot diabetic ulcer last seen 11/13 using Prisma, a collagen based dressing NOT ON formulary at [facility]  Reason for Consult: R foot ulcer  Wound type: full thickness R lateral foot diabetic ulcer  Pressure Injury POA: na not pressure  Measurement: see nursing flowsheet  Wound bed: largely red  Drainage (amount, consistency, odor) see nursing flowsheet  Periwound: some callusing ? Maceration  Dressing procedure/placement/frequency: Cleanse R lateral foot wound with Betadine , allow to air dry. Apply silver hydrofiber (lawson [phone]) to wound bed every other day and secure with silicone foam.  Soak silver with NS if adhered to wound bed for atraumatic removal.   POC discussed with bedside nurse. WOC team will not follow. Reconsult if further needs arise.   Thank you,    Powell Bar MSN, RN-BC, [employer]

## 2024-05-27 NOTE — Evaluation (Signed)
 Physical Therapy Evaluation Patient Details Name: Robert Ramirez MRN: [id number] DOB: [birth year] Today's Date: 05/27/2024  History of Present Illness  Pt is a 79 y.o.presenting to [facility] on 05/26/2024 due to progressive shortness of breathe. PMH is significant for HTN, HLD, HFpEF, heart block s/p PPM, and DMT2.  Clinical Impression  Pt currently is ind with bed mobility, Mod I with sit to stand and in-room distance gait with O2 sats remaining above 90% on 3L O2 via [place]. Pt reports he has been getting up using the urinal in the room by himself. No obvious balance deficits; states he was working with HHPT last year but feels he is moving at baseline. Step son can assist at home as needed. Pt will benefit from continued mobility while in the hospital by mobility team and nursing staff but due to pt is at baseline level of functioning does not have needs for acute physical therapy services at this time. Will sign off at this time. Please re-consult if further needs arise.         If plan is discharge home, recommend the following: Assistance with cooking/housework;Assist for transportation     Equipment Recommendations None recommended by PT     Functional Status Assessment Patient has not had a recent decline in their functional status     Precautions / Restrictions Precautions Precautions: None Recall of Precautions/Restrictions: Intact Restrictions Weight Bearing Restrictions Per Provider Order: No      Mobility  Bed Mobility Overal bed mobility: Modified Independent      Transfers Overall transfer level: Modified independent Equipment used: None        Ambulation/Gait Ambulation/Gait assistance: Modified independent (Device/Increase time) Gait Distance (Feet): 20 Feet Assistive device: None Gait Pattern/deviations: Step-through pattern, Decreased stride length Gait velocity: decreased Gait velocity interpretation: 1.31 - 2.62 ft/sec, indicative of limited community ambulator    General Gait Details: O2 sats remained above 90% on 3L O2 via        Balance Overall balance assessment: Modified Independent         Pertinent Vitals/Pain Pain Assessment Pain Assessment: No/denies pain    Home Living Family/patient expects to be discharged to:: Private residence Living Arrangements: Children;Alone (step son lives on property) Available Help at Discharge: Family;Available 24 hours/day (step son lives behind him) Type of Home: House Home Access: Level entry       Home Layout: One level Home Equipment: Cane - single Information Systems Manager (2 wheels);Shower seat;Grab bars - tub/shower;Other (comment) Additional Comments: Bipap    Prior Function Prior Level of Function : Independent/Modified Independent;Driving             Mobility Comments: Pt reports he uses [facility] for mobility ADLs Comments: step son helps with groceries and transportation. Ind with finances and medication. Able to drive but doesn't.     Extremity/Trunk Assessment   Upper Extremity Assessment Upper Extremity Assessment: Overall WFL for tasks assessed    Lower Extremity Assessment Lower Extremity Assessment: Overall WFL for tasks assessed    Cervical / Trunk Assessment Cervical / Trunk Assessment: Normal  Communication   Communication Communication: No apparent difficulties    Cognition Arousal: Alert Behavior During Therapy: WFL for tasks assessed/performed   PT - Cognitive impairments: No apparent impairments   Following commands: Intact       Cueing Cueing Techniques: Verbal cues            Assessment/Plan    PT Assessment Patient does not need any further PT services  PT Goals (Current goals can be found in the Care Plan section)  Acute Rehab PT Goals PT Goal Formulation: All assessment and education complete, DC therapy     AM-PAC PT 6 Clicks Mobility  Outcome Measure Help needed turning from your back to your side while in a  flat bed without using bedrails?: None Help needed moving from lying on your back to sitting on the side of a flat bed without using bedrails?: None Help needed moving to and from a bed to a chair (including a wheelchair)?: None Help needed standing up from a chair using your arms (e.g., wheelchair or bedside chair)?: None Help needed to walk in hospital room?: None   6 Click Score: 20    End of Session Equipment Utilized During Treatment: Gait belt;Oxygen  Activity Tolerance: Patient tolerated treatment well Patient left: in bed;with call bell/phone within reach Nurse Communication: Mobility status      Time: 1225-1239 PT Time Calculation (min) (ACUTE ONLY): 14 min   Charges:   PT Evaluation $PT Eval Low Complexity: 1 Low   PT General Charges $$ ACUTE PT VISIT: 1 Visit        Robert Ramirez, DPT, CLT  Acute Rehabilitation Services Office: [phone] (Secure chat preferred)   Robert Ramirez 05/27/2024, 1:57 PM

## 2024-05-27 NOTE — ED Provider Notes (Signed)
 " [place] [facility] Provider Note   CSN: [id number] Arrival date & time: 05/26/24  1509     Patient presents with: No chief complaint on file.   Robert Ramirez is a 79 y.o. male.   The history is provided by the patient and medical records.   79 year old male with history of hypertension, diabetes, peripheral arterial disease, CHF, hyperlipidemia, presenting to the ED for shortness of breath.  States he feels like he has fluid overload again.  He has been compliant with his Demadex  and even taking extra doses in the afternoon which usually helps with the excess fluid, however does not really seem to be helping this time.  He denies any cough or fever.  He is on chronic 2 L, has been using increased O2 at home.  He does admit he has been eating a ton of sauerkraut recently as he did not realize there was such a high salt content in this.  Prior to Admission medications  Medication Sig Start Date End Date Taking? Authorizing Provider  albuterol  (VENTOLIN  HFA) 108 (90 Base) MCG/ACT inhaler INHALE 2 PUFFS BY MOUTH EVERY 6 HOURS AS NEEDED FOR WHEEZING FOR SHORTNESS OF BREATH 04/11/24   Hope Almarie ORN, NP  amLODipine  (NORVASC ) 10 MG tablet Take 1 tablet (10 mg total) by mouth daily. 04/06/24 07/05/24  Jaycee Greig PARAS, NP  aspirin  EC 81 MG tablet Take 81 mg by mouth daily. Swallow whole.    [provider]  atorvastatin  (LIPITOR) 40 MG tablet Take 40 mg by mouth daily.    [provider]  blood glucose meter kit and supplies Dispense based on patient and insurance preference. Use up to four times daily as directed. (FOR ICD-10 E10.9, E11.9). 02/08/22   Jaycee Greig PARAS, NP  Cholecalciferol (VITAMIN D-3) 25 MCG (1000 UT) CAPS Take 1 capsule by mouth every morning.    [provider]  empagliflozin  (JARDIANCE ) 10 MG TABS tablet Take 1 tablet (10 mg total) by mouth daily before breakfast. 05/07/23   Thukkani, Arun K, MD  ezetimibe  (ZETIA )  10 MG tablet Take 1 tablet (10 mg total) by mouth daily. 11/05/23   Thukkani, Arun K, MD  gentamicin  ointment (GARAMYCIN ) 0.1 % Apply 1 Application topically daily. Apply to wound daily 02/04/24   McDonald, Juliene SAUNDERS, DPM  Insulin  NPH, Human,, Isophane, (HUMULIN  N KWIKPEN) 100 UNIT/ML Kiwkpen Inject 33 Units into the skin in the morning and at bedtime. 10/02/23   Newlin, Enobong, MD  Insulin  Pen Needle (DROPLET PEN NEEDLES) 31G X 8 MM MISC 1 each by Other route 2 (two) times daily with a meal. 08/04/22   Jaycee Greig PARAS, NP  levothyroxine  (SYNTHROID ) 50 MCG tablet Take 1 tablet (50 mcg total) by mouth daily. 12/25/23 03/30/25  Jaycee Greig PARAS, NP  metFORMIN  (GLUCOPHAGE ) 1000 MG tablet Take 1 tablet (1,000 mg total) by mouth 2 (two) times daily with a meal. 12/25/23 03/30/25  Jaycee Greig PARAS, NP  nitroGLYCERIN  (NITROSTAT ) 0.4 MG SL tablet Place 1 tablet (0.4 mg total) under the tongue every 5 (five) minutes as needed for chest pain. 03/29/21   Thukkani, Arun K, MD  torsemide  (DEMADEX ) 20 MG tablet Take 2 tablets (40 mg total) by mouth daily. May take additional torsemide  dose in the afternoon as needed 04/20/24   Lee, Jordan, NP  vitamin B-12 (CYANOCOBALAMIN ) 500 MCG tablet Take 500 mcg by mouth daily.    [provider]    Allergies: Patient has no known  allergies.    Review of Systems  Respiratory:  Positive for shortness of breath.   Cardiovascular:  Positive for leg swelling.  All other systems reviewed and are negative.   Updated Vital Signs BP (!) 125/103 (BP Location: Left Arm)   Pulse 86   Temp (!) 97.4 F (36.3 C)   Resp 20   Ht 6' 1 (1.854 m)   Wt 93.4 kg   SpO2 93%   BMI 27.18 kg/m   Physical Exam Vitals and nursing note reviewed.  Constitutional:      Appearance: He is well-developed.  HENT:     Head: Normocephalic and atraumatic.  Eyes:     Conjunctiva/sclera: Conjunctivae normal.     Pupils: Pupils are equal, round, and reactive to light.  Cardiovascular:     Rate and  Rhythm: Normal rate and regular rhythm.     Heart sounds: Normal heart sounds.  Pulmonary:     Effort: Pulmonary effort is normal.     Breath sounds: Normal breath sounds.     Comments: 3L O2 in use (baseline 2L), decreased lung sounds on right compared with left Abdominal:     General: Bowel sounds are normal.     Palpations: Abdomen is soft.  Musculoskeletal:        General: Normal range of motion.     Cervical back: Normal range of motion.     Comments: 2+ pitting edema BLE  Skin:    General: Skin is warm and dry.  Neurological:     Mental Status: He is alert and oriented to person, place, and time.     (all labs ordered are listed, but only abnormal results are displayed) Labs Reviewed  CBC WITH DIFFERENTIAL/PLATELET - Abnormal; Notable for the following components:      Result Value   Hemoglobin 12.4 (*)    All other components within normal limits  BASIC METABOLIC PANEL WITH GFR - Abnormal; Notable for the following components:   Chloride 88 (*)    CO2 41 (*)    Glucose, Bld 115 (*)    BUN 29 (*)    Creatinine, Ser 1.26 (*)    GFR, Estimated 59 (*)    All other components within normal limits  PRO BRAIN NATRIURETIC PEPTIDE - Abnormal; Notable for the following components:   Pro Brain Natriuretic Peptide 1,659.0 (*)    All other components within normal limits  TROPONIN T, HIGH SENSITIVITY - Abnormal; Notable for the following components:   Troponin T High Sensitivity 87 (*)    All other components within normal limits  TROPONIN T, HIGH SENSITIVITY - Abnormal; Notable for the following components:   Troponin T High Sensitivity 92 (*)    All other components within normal limits  CBC WITH DIFFERENTIAL/PLATELET  COMPREHENSIVE METABOLIC PANEL WITH GFR  MAGNESIUM   PRO BRAIN NATRIURETIC PEPTIDE    EKG: EKG Interpretation Date/Time:  Thursday May 26 2024 15:35:45 EST Ventricular Rate:  86 PR Interval:  180 QRS Duration:  148 QT Interval:  432 QTC  Calculation: 516 R Axis:   128  Text Interpretation: Atrial-sensed ventricular-paced rhythm with occasional Premature ventricular complexes Abnormal ECG When compared with ECG of 29-Dec-2023 12:02, PREVIOUS ECG IS PRESENT Confirmed by Lorette Mayo [phone]) on 05/27/2024 2:19:41 AM  Radiology: [facility] Chest 2 View Result Date: 05/26/2024 CLINICAL DATA:  Shortness of breath, swelling. EXAM: CHEST - 2 VIEW COMPARISON:  12/09/2023 FINDINGS: Dual lead left-sided pacemaker in place. The heart is enlarged but stable. Sequela of prior  granulomatous disease with calcified hilar lymph nodes and scattered granuloma. Small to moderate size right pleural effusion with ill-defined opacity at the right lung base. Small left pleural effusion. No pneumothorax. No convincing pulmonary edema. There are calcified pleural plaques that were better assessed on prior CT. IMPRESSION: 1. Small to moderate size right pleural effusion with ill-defined opacity at the right lung base, favor scarring when compared with prior CT. 2. Small left pleural effusion. 3. Stable cardiomegaly. No convincing pulmonary edema. Electronically Signed   By: Andrea Gasman M.D.   On: 05/26/2024 18:10     Procedures   Medications Ordered in the ED  acetaminophen  (TYLENOL ) tablet 650 mg (has no administration in time range)    Or  acetaminophen  (TYLENOL ) suppository 650 mg (has no administration in time range)  melatonin tablet 3 mg (has no administration in time range)  ondansetron  (ZOFRAN ) injection 4 mg (has no administration in time range)  furosemide  (LASIX ) injection 40 mg (40 mg Intravenous Given 05/27/24 0430)                                    Medical Decision Making Amount and/or Complexity of Data Reviewed Radiology: ordered and independent interpretation performed. ECG/medicine tests: ordered and independent interpretation performed.  Risk Prescription drug management. Decision regarding hospitalization.   79 year old male  presenting to the ED with shortness of breath.  Feels like he has fluid overload again.  Has been compliant with his Demadex  including extra nighttime dosing without much relief.  Chronically on 2 L, has been requiring increased O2 recently  He is afebrile and nontoxic in appearance here.  He is currently on 3 L.  He does appear clinically fluid overloaded with 2+ pitting edema bilateral lower extremities.  Labs obtained from triage and reviewed--no leukocytosis, creatinine appears at baseline.  BNP 1659.  Initial Trope 87, delta 92.  He denies any active chest pain.  Does not have any ischemic changes on EKG.  I suspect this is likely demand ischemia.  Will give additional IV Lasix .  He will require admission for diuresis.  Discussed with hospitalist, Dr. Marcene, who will admit for ongoing care.  Final diagnoses:  Acute on chronic congestive heart failure, unspecified heart failure type [facility])    ED Discharge Orders     None          Jarold Olam HERO, PA-C 05/27/24 0449    Lorette Mayo, MD 05/27/24 [phone]  "

## 2024-05-27 NOTE — Assessment & Plan Note (Signed)
 Currently well-controlled, continue home medication

## 2024-05-27 NOTE — Assessment & Plan Note (Addendum)
-   Denies any chest pain, - Troponin , at 87, 92  - no acute changes on EKG -Monitor closely: As needed nitroglycerin , as needed analgesics

## 2024-05-27 NOTE — Assessment & Plan Note (Signed)
 Counseled regarding tobacco use and abuse

## 2024-05-27 NOTE — Progress Notes (Signed)
" °  Carryover admission to the Day Admitter.  I discussed this case with the EDP, Olam Slocumb, PA.  Per these discussions:   This is a 79 year old male with history of chronic diastolic heart failure, chronic hypoxic respiratory failure on 2 L continuous nasal cannula at baseline, who is being admitted with acute on chronic diastolic heart failure complicated by acute on chronic hypoxic respiratory failure after presenting with 2 to 3 days of progressive shortness of breath.  ED conveyed to EDP that his diet over the holidays is getting assisted of a relative increase in salt relative to baseline.  No recent chest pain.  He notes good compliance with his home Demadex , and reports progression in his shortness of breath over the last few days, in spite of taking extra nighttime doses of his home Demadex , which he reports usually improves his shortness of breath at the beginning of a heart failure exacerbation.   In the ED this evening, he is requiring 3 L nasal cannula relative to his baseline 2 L continuous nasal cannula.  proBNP elevated at 1659.  Troponin also reported to be mildly elevated, but with EKG that shows no evidence of acute ischemic changes.  Chest x-ray is reported to be consistent with acute CHF.   In the ED this AM, he received Lasix  40 mg IV x 1 dose.   I have placed an order for inpatient admission for further evaluation management of the above.  I have placed some additional preliminary admit orders via the adult multi-morbid admission order set. I have also ordered repeat proBNP in the morning in addition to morning labs that include CMP, CBC, and magnesium  level.  I will defer to the admitting hospitalist decision making regarding additional dosing of IV diuresis.    Eva Pore, DO Hospitalist  "

## 2024-05-27 NOTE — Assessment & Plan Note (Signed)
 Noted-functional

## 2024-05-27 NOTE — Assessment & Plan Note (Signed)
 Home dose Synthroid , check TSH

## 2024-05-28 DIAGNOSIS — I5033 Acute on chronic diastolic (congestive) heart failure: Secondary | ICD-10-CM | POA: Diagnosis not present

## 2024-05-28 LAB — GLUCOSE, CAPILLARY
Glucose-Capillary: 117 mg/dL — ABNORMAL HIGH (ref 70–99)
Glucose-Capillary: 164 mg/dL — ABNORMAL HIGH (ref 70–99)
Glucose-Capillary: 182 mg/dL — ABNORMAL HIGH (ref 70–99)
Glucose-Capillary: 181 mg/dL — ABNORMAL HIGH (ref 70–99)

## 2024-05-28 LAB — BASIC METABOLIC PANEL WITH GFR
Anion gap: 7 (ref 5–15)
BUN: 31 mg/dL — ABNORMAL HIGH (ref 8–23)
CO2: 44 mmol/L — ABNORMAL HIGH (ref 22–32)
Calcium: 9.5 mg/dL (ref 8.9–10.3)
Chloride: 93 mmol/L — ABNORMAL LOW (ref 98–111)
Creatinine, Ser: 1.27 mg/dL — ABNORMAL HIGH (ref 0.61–1.24)
GFR, Estimated: 58 mL/min — ABNORMAL LOW
Glucose, Bld: 112 mg/dL — ABNORMAL HIGH (ref 70–99)
Potassium: 3.4 mmol/L — ABNORMAL LOW (ref 3.5–5.1)
Sodium: 144 mmol/L (ref 135–145)

## 2024-05-28 LAB — PRO BRAIN NATRIURETIC PEPTIDE: Pro Brain Natriuretic Peptide: 1584 pg/mL — ABNORMAL HIGH

## 2024-05-28 MED ORDER — POTASSIUM CHLORIDE CRYS ER 20 MEQ PO TBCR
40.0000 meq | EXTENDED_RELEASE_TABLET | Freq: Two times a day (BID) | ORAL | Status: AC
  Administered 2024-05-28 (×2): 40 meq via ORAL
  Filled 2024-05-28 (×2): qty 2

## 2024-05-28 NOTE — Plan of Care (Signed)
" °  Problem: Education: Goal: Knowledge of General Education information will improve Description: Including pain rating scale, medication(s)/side effects and non-pharmacologic comfort measures Outcome: Progressing   Problem: Clinical Measurements: Goal: Ability to maintain clinical measurements within normal limits will improve Outcome: Progressing Goal: Will remain free from infection Outcome: Progressing Goal: Diagnostic test results will improve Outcome: Progressing Goal: Cardiovascular complication will be avoided Outcome: Progressing   Problem: Activity: Goal: Risk for activity intolerance will decrease Outcome: Progressing   Problem: Nutrition: Goal: Adequate nutrition will be maintained Outcome: Progressing   Problem: Coping: Goal: Level of anxiety will decrease Outcome: Progressing   Problem: Elimination: Goal: Will not experience complications related to bowel motility Outcome: Progressing Goal: Will not experience complications related to urinary retention Outcome: Progressing   Problem: Pain Managment: Goal: General experience of comfort will improve and/or be controlled Outcome: Progressing   "

## 2024-05-28 NOTE — Plan of Care (Signed)
  Problem: Education: Goal: Knowledge of General Education information will improve Description: Including pain rating scale, medication(s)/side effects and non-pharmacologic comfort measures Outcome: Progressing   Problem: Clinical Measurements: Goal: Diagnostic test results will improve Outcome: Progressing   Problem: Activity: Goal: Risk for activity intolerance will decrease Outcome: Progressing   Problem: Nutrition: Goal: Adequate nutrition will be maintained Outcome: Progressing   Problem: Coping: Goal: Level of anxiety will decrease Outcome: Progressing   Problem: Safety: Goal: Ability to remain free from injury will improve Outcome: Progressing

## 2024-05-28 NOTE — Progress Notes (Signed)
 Mobility Specialist: Progress Note   05/28/24 1000  Mobility  Activity Ambulated with assistance  Level of Assistance Standby assist, set-up cues, supervision of patient - no hands on  Assistive Device None (pushed O2 tank)  Distance Ambulated (ft) 100 ft  Activity Response Tolerated well  Mobility Referral Yes  Mobility visit 1 Mobility  Mobility Specialist Start Time (ACUTE ONLY) 0912  Mobility Specialist Stop Time (ACUTE ONLY) [id number]  Mobility Specialist Time Calculation (min) (ACUTE ONLY) 9 min    Pt received in bed, agreeable to mobility session. SV throughout. RLE post op shoe and L shoe donned while standing. NO unsteadiness or LOB. SpO2 WFL on 3LO2. Returned to room. Left on EOB with all needs met, call bell in reach.   Ileana Lute Mobility Specialist Please contact via SecureChat or Rehab office at [phone]

## 2024-05-28 NOTE — Progress Notes (Signed)
 " PROGRESS NOTE    Robert Ramirez  FMW:[id number] DOB: 1945-07-10 DOA: 05/26/2024 PCP: Jaycee Greig PARAS, NP  78/M with CAD, COPD, chronic respiratory failure, end-stage diastolic CHF/RV failure/cor pulmonal, history of asbestosis exposure with pleural plaques, prior tobacco use followed by advanced heart failure clinic presented to the ED with worsening dyspnea over 2 to 3 days, cough.  Compliant with diuretics, also cooks most of his meals, recent use of sauerkraut.  In the ED BNP 1659, troponin 87, 92, creatinine 1.2, chest x-ray with pleural effusions and scarring Admitted, started on diuretics  Subjective: Feels little better, breathing is improving  Assessment and Plan:  Acute on chronic diastolic CHF End-stage RV failure, cor pulmonale - Admitted with volume overload,  - Last echo 12/07/2023, EF 60 to 65%, normal LV function, mild LVH, right RV overload, mildly increased in size, moderate elevated pulmonary artery hypertension - Right heart cath 7/25 concerning for severe pulmonary hypertension with advanced RV failure - Continue Lasix  40 mg 3 times daily, continue Jardiance  - Add Aldactone  tomorrow if blood pressure tolerates - Add Unna boots - Discussed overall prognosis and disease burden, agreeable to DNR  Elevated troponin - Secondary to demand ischemia, from above, no ACS  History of permanent cardiac pacemaker placement Noted-functional  CKD 3 Stable  Acquired hypothyroidism Home dose Synthroid , check TSH  Chewing tobacco use Counseled regarding tobacco use and abuse  CAD Continue aspirin , Lipitor, statins, Zetia   COPD -On O2 2 L at baseline Previous outpatient workup ruled out IDL, chronic obstructive asthma noted Continue as needed DuoNebs  Diabetes (HCC) On Jardiance , and Humulin -NPH Holding metformin  -Fu A1c -Check CBG q. ACHS, SSI coverage  Hypertension associated with diabetes (HCC) Currently well-controlled, continue home medication  Mixed  hyperlipidemia Continue statin   DVT prophylaxis: Heparin  subcutaneous Code Status: Discussed CODE STATUS with the patient, he is agreeable to DNR Family Communication: No family at bedside Disposition Plan: Home in 2 to 3 days pending clinical improvement  Consultants:    Procedures:   Antimicrobials:    Objective: Vitals:   05/27/24 2320 05/28/24 0423 05/28/24 0550 05/28/24 0837  BP:   128/60 103/66  Pulse: 79  75 77  Resp:   18 20  Temp:   97.6 F (36.4 C) 97.8 F (36.6 C)  TempSrc:   Oral Oral  SpO2: 94%  96% 92%  Weight:  86.5 kg    Height:        Intake/Output Summary (Last 24 hours) at 05/28/2024 1024 Last data filed at 05/28/2024 0839 Gross per 24 hour  Intake 630 ml  Output 2400 ml  Net -1770 ml   Filed Weights   05/26/24 1518 05/27/24 1344 05/28/24 0423  Weight: 93.4 kg 89.6 kg 86.5 kg    Examination:  General exam: Chronically ill-appearing, AAO x 3 Respiratory system: Decreased breath sounds at the bases  Cardiovascular system: S1 & S2 heard, RRR.  Abd: nondistended, soft and nontender.Normal bowel sounds heard. Central nervous system: Alert and oriented. No focal neurological deficits. Extremities: 2+ edema, chronic skin changes Skin: No rashes Psychiatry:  Mood & affect appropriate.     Data Reviewed:   CBC: Recent Labs  Lab 05/26/24 1605 05/27/24 0500  WBC 9.8 8.1  NEUTROABS 7.7 6.1  HGB 12.4* 12.5*  HCT 39.3 38.9*  MCV 91.8 91.1  PLT 215 199   Basic Metabolic Panel: Recent Labs  Lab 05/26/24 1605 05/27/24 0500 05/27/24 0824 05/28/24 0309  NA 140 142  --  144  K  4.2 3.5  --  3.4*  CL 88* 91*  --  93*  CO2 41* 41*  --  44*  GLUCOSE 115* 136*  --  112*  BUN 29* 29*  --  31*  CREATININE 1.26* 1.25*  --  1.27*  CALCIUM  9.5 9.6  --  9.5  MG  --  1.6* 1.5*  --   PHOS  --   --  3.4  --    GFR: Estimated Creatinine Clearance: 55 mL/min (A) (by C-G formula based on SCr of 1.27 mg/dL (H)). Liver Function Tests: Recent Labs   Lab 05/27/24 0500  AST 22  ALT 11  ALKPHOS 146*  BILITOT 1.0  PROT 6.7  ALBUMIN 3.8   No results for input(s): LIPASE, AMYLASE in the last 168 hours. No results for input(s): AMMONIA in the last 168 hours. Coagulation Profile: Recent Labs  Lab 05/27/24 2253  INR 1.0   Cardiac Enzymes: No results for input(s): CKTOTAL, CKMB, CKMBINDEX, TROPONINI in the last 168 hours. BNP (last 3 results) Recent Labs    05/26/24 1605 05/27/24 0500 05/28/24 0309  PROBNP 1,659.0* 1,364.0* 1,584.0*   HbA1C: No results for input(s): HGBA1C in the last 72 hours. CBG: Recent Labs  Lab 05/27/24 1738 05/27/24 2136 05/28/24 0600  GLUCAP 179* 173* 117*   Lipid Profile: No results for input(s): CHOL, HDL, LDLCALC, TRIG, CHOLHDL, LDLDIRECT in the last 72 hours. Thyroid  Function Tests: No results for input(s): TSH, T4TOTAL, FREET4, T3FREE, THYROIDAB in the last 72 hours. Anemia Panel: No results for input(s): VITAMINB12, FOLATE, FERRITIN, TIBC, IRON, RETICCTPCT in the last 72 hours. Urine analysis:    Component Value Date/Time   COLORURINE YELLOW 12/07/2023 0839   APPEARANCEUR CLEAR 12/07/2023 0839   LABSPEC 1.022 12/07/2023 0839   PHURINE 5.0 12/07/2023 0839   GLUCOSEU >=500 (A) 12/07/2023 0839   HGBUR NEGATIVE 12/07/2023 0839   BILIRUBINUR NEGATIVE 12/07/2023 0839   KETONESUR NEGATIVE 12/07/2023 0839   PROTEINUR NEGATIVE 12/07/2023 0839   NITRITE NEGATIVE 12/07/2023 0839   LEUKOCYTESUR NEGATIVE 12/07/2023 0839   Sepsis Labs: @LABRCNTIP (procalcitonin:4,lacticidven:4)  )No results found for this or any previous visit (from the past 240 hours).   Radiology Studies: DG Chest 2 View Result Date: 05/26/2024 CLINICAL DATA:  Shortness of breath, swelling. EXAM: CHEST - 2 VIEW COMPARISON:  12/09/2023 FINDINGS: Dual lead left-sided pacemaker in place. The heart is enlarged but stable. Sequela of prior granulomatous disease with calcified  hilar lymph nodes and scattered granuloma. Small to moderate size right pleural effusion with ill-defined opacity at the right lung base. Small left pleural effusion. No pneumothorax. No convincing pulmonary edema. There are calcified pleural plaques that were better assessed on prior CT. IMPRESSION: 1. Small to moderate size right pleural effusion with ill-defined opacity at the right lung base, favor scarring when compared with prior CT. 2. Small left pleural effusion. 3. Stable cardiomegaly. No convincing pulmonary edema. Electronically Signed   By: Andrea Gasman M.D.   On: 05/26/2024 18:10     Scheduled Meds:  aspirin  EC  81 mg Oral Daily   atorvastatin   40 mg Oral Daily   cholecalciferol   1,000 Units Oral q morning   empagliflozin   10 mg Oral QAC breakfast   ezetimibe   10 mg Oral Daily   furosemide   40 mg Intravenous Q8H   heparin   5,000 Units Subcutaneous Q8H   insulin  aspart  0-15 Units Subcutaneous TID WC   insulin  NPH Human  20 Units Subcutaneous BID AC   levothyroxine   50 mcg Oral Daily   potassium chloride   40 mEq Oral BID   sodium chloride  flush  3 mL Intravenous Q12H   sodium chloride  flush  3 mL Intravenous Q12H   Continuous Infusions:   LOS: 1 day    Time spent:    Sigurd Pac, MD [facility]   05/28/2024, 10:24 AM    "

## 2024-05-28 NOTE — Progress Notes (Signed)
 Orthopedic Tech Progress Note Patient Details:  Michoel Kunin 1946/03/05 [id number] Applied unna boots per order. Ortho Devices Type of Ortho Device: Radio broadcast assistant Ortho Device/Splint Location: BLE Ortho Device/Splint Interventions: Ordered, Application, Adjustment   Post Interventions Patient Tolerated: Well Instructions Provided: Adjustment of device, Care of device, Poper ambulation with device  Morna Pink 05/28/2024, 12:38 PM

## 2024-05-29 DIAGNOSIS — I5033 Acute on chronic diastolic (congestive) heart failure: Secondary | ICD-10-CM | POA: Diagnosis not present

## 2024-05-29 LAB — GLUCOSE, CAPILLARY
Glucose-Capillary: 132 mg/dL — ABNORMAL HIGH (ref 70–99)
Glucose-Capillary: 234 mg/dL — ABNORMAL HIGH (ref 70–99)
Glucose-Capillary: 121 mg/dL — ABNORMAL HIGH (ref 70–99)
Glucose-Capillary: 211 mg/dL — ABNORMAL HIGH (ref 70–99)

## 2024-05-29 LAB — BASIC METABOLIC PANEL WITH GFR
BUN: 31 mg/dL — ABNORMAL HIGH (ref 8–23)
CO2: >45 mmol/L — ABNORMAL HIGH (ref 22–32)
Calcium: 9.5 mg/dL (ref 8.9–10.3)
Chloride: 93 mmol/L — ABNORMAL LOW (ref 98–111)
Creatinine, Ser: 1.26 mg/dL — ABNORMAL HIGH (ref 0.61–1.24)
GFR, Estimated: 59 mL/min — ABNORMAL LOW
Glucose, Bld: 86 mg/dL (ref 70–99)
Potassium: 3.7 mmol/L (ref 3.5–5.1)
Sodium: 143 mmol/L (ref 135–145)

## 2024-05-29 LAB — PRO BRAIN NATRIURETIC PEPTIDE: Pro Brain Natriuretic Peptide: 1665 pg/mL — ABNORMAL HIGH

## 2024-05-29 MED ORDER — POTASSIUM CHLORIDE CRYS ER 20 MEQ PO TBCR
40.0000 meq | EXTENDED_RELEASE_TABLET | Freq: Once | ORAL | Status: AC
  Administered 2024-05-29: 40 meq via ORAL
  Filled 2024-05-29: qty 2

## 2024-05-29 MED ORDER — SPIRONOLACTONE 25 MG PO TABS
25.0000 mg | ORAL_TABLET | Freq: Every day | ORAL | Status: DC
  Administered 2024-05-29 – 2024-05-31 (×3): 25 mg via ORAL
  Filled 2024-05-29 (×3): qty 1

## 2024-05-29 NOTE — Progress Notes (Signed)
 " PROGRESS NOTE    Robert Ramirez  FMW:[id number] DOB: 04-02-1946 DOA: 05/26/2024 PCP: Jaycee Greig PARAS, NP  78/M with CAD, COPD, chronic respiratory failure, end-stage diastolic CHF/RV failure/cor pulmonal, history of asbestosis exposure with pleural plaques, prior tobacco use followed by advanced heart failure clinic presented to the ED with worsening dyspnea over 2 to 3 days, cough.  Compliant with diuretics, also cooks most of his meals, recent use of sauerkraut.  In the ED BNP 1659, troponin 87, 92, creatinine 1.2, chest x-ray with pleural effusions and scarring Admitted, started on diuretics  Subjective: Feels better, breathing continues to improve  Assessment and Plan:  Acute on chronic diastolic CHF End-stage RV failure, cor pulmonale - Admitted with volume overload,  - Last echo 12/07/2023, EF 60 to 65%, normal LV function, mild LVH, right RV overload, mildly increased in size, moderate elevated pulmonary artery hypertension - Right heart cath 7/25 concerning for severe pulmonary hypertension with advanced RV failure - Improving with diuresis, 4.7 L negative, continue Lasix  40 mg 3 times daily, Jardiance , add Aldactone  -Unna boots - Discussed overall prognosis and disease burden, agreeable to DNR - Increase activity,  Elevated troponin - Secondary to demand ischemia, from above, no ACS  History of permanent cardiac pacemaker placement Noted-functional  CKD 3 Stable  Acquired hypothyroidism Home dose Synthroid , check TSH  Chewing tobacco use Counseled regarding tobacco use and abuse  CAD Continue aspirin , Lipitor, statins, Zetia   COPD -On O2 2 L at baseline Previous outpatient workup ruled out IDL, chronic obstructive asthma noted Continue as needed DuoNebs  Diabetes (HCC) On Jardiance , and Humulin -NPH Holding metformin  -Fu A1c -Check CBG q. ACHS, SSI coverage  Hypertension associated with diabetes (HCC) Currently well-controlled, continue home medication  Mixed  hyperlipidemia Continue statin   DVT prophylaxis: Heparin  subcutaneous Code Status: Discussed CODE STATUS with the patient, he is agreeable to DNR Family Communication: No family at bedside Disposition Plan: Home likely 1 to 2 days  Consultants:    Procedures:   Antimicrobials:    Objective: Vitals:   05/28/24 1953 05/28/24 2335 05/29/24 0351 05/29/24 0715  BP: 124/64   133/85  Pulse: 92  89 73  Resp: 19 19 (!) 21 20  Temp: 97.8 F (36.6 C) 98 F (36.7 C) 97.6 F (36.4 C) 98.4 F (36.9 C)  TempSrc: Oral Oral Oral Oral  SpO2:    94%  Weight:   87.3 kg   Height:        Intake/Output Summary (Last 24 hours) at 05/29/2024 1020 Last data filed at 05/29/2024 0600 Gross per 24 hour  Intake 600 ml  Output 3625 ml  Net -3025 ml   Filed Weights   05/27/24 1344 05/28/24 0423 05/29/24 0351  Weight: 89.6 kg 86.5 kg 87.3 kg    Examination:  General exam: Chronically ill-appearing, AAO x 3, no distress Respiratory system: Proving air movement, few basilar rales Cardiovascular system: S1 & S2 heard, RRR.  Abd: nondistended, soft and nontender.Normal bowel sounds heard. Central nervous system: Alert and oriented. No focal neurological deficits. Extremities: 1-2+ edema, Unna boots on Skin: No rashes Psychiatry:  Mood & affect appropriate.     Data Reviewed:   CBC: Recent Labs  Lab 05/26/24 1605 05/27/24 0500  WBC 9.8 8.1  NEUTROABS 7.7 6.1  HGB 12.4* 12.5*  HCT 39.3 38.9*  MCV 91.8 91.1  PLT 215 199   Basic Metabolic Panel: Recent Labs  Lab 05/26/24 1605 05/27/24 0500 05/27/24 0824 05/28/24 0309 05/29/24 0205  NA 140  142  --  144 143  K 4.2 3.5  --  3.4* 3.7  CL 88* 91*  --  93* 93*  CO2 41* 41*  --  44* >45*  GLUCOSE 115* 136*  --  112* 86  BUN 29* 29*  --  31* 31*  CREATININE 1.26* 1.25*  --  1.27* 1.26*  CALCIUM  9.5 9.6  --  9.5 9.5  MG  --  1.6* 1.5*  --   --   PHOS  --   --  3.4  --   --    GFR: Estimated Creatinine Clearance: 55.5 mL/min  (A) (by C-G formula based on SCr of 1.26 mg/dL (H)). Liver Function Tests: Recent Labs  Lab 05/27/24 0500  AST 22  ALT 11  ALKPHOS 146*  BILITOT 1.0  PROT 6.7  ALBUMIN 3.8   No results for input(s): LIPASE, AMYLASE in the last 168 hours. No results for input(s): AMMONIA in the last 168 hours. Coagulation Profile: Recent Labs  Lab 05/27/24 2253  INR 1.0   Cardiac Enzymes: No results for input(s): CKTOTAL, CKMB, CKMBINDEX, TROPONINI in the last 168 hours. BNP (last 3 results) Recent Labs    05/27/24 0500 05/28/24 0309 05/29/24 0205  PROBNP 1,364.0* 1,584.0* 1,665.0*   HbA1C: No results for input(s): HGBA1C in the last 72 hours. CBG: Recent Labs  Lab 05/28/24 0600 05/28/24 1127 05/28/24 1704 05/28/24 2101 05/29/24 0625  GLUCAP 117* 164* 182* 181* 132*   Lipid Profile: No results for input(s): CHOL, HDL, LDLCALC, TRIG, CHOLHDL, LDLDIRECT in the last 72 hours. Thyroid  Function Tests: No results for input(s): TSH, T4TOTAL, FREET4, T3FREE, THYROIDAB in the last 72 hours. Anemia Panel: No results for input(s): VITAMINB12, FOLATE, FERRITIN, TIBC, IRON, RETICCTPCT in the last 72 hours. Urine analysis:    Component Value Date/Time   COLORURINE YELLOW 12/07/2023 0839   APPEARANCEUR CLEAR 12/07/2023 0839   LABSPEC 1.022 12/07/2023 0839   PHURINE 5.0 12/07/2023 0839   GLUCOSEU >=500 (A) 12/07/2023 0839   HGBUR NEGATIVE 12/07/2023 0839   BILIRUBINUR NEGATIVE 12/07/2023 0839   KETONESUR NEGATIVE 12/07/2023 0839   PROTEINUR NEGATIVE 12/07/2023 0839   NITRITE NEGATIVE 12/07/2023 0839   LEUKOCYTESUR NEGATIVE 12/07/2023 0839   Sepsis Labs: @LABRCNTIP (procalcitonin:4,lacticidven:4)  )No results found for this or any previous visit (from the past 240 hours).   Radiology Studies: No results found.    Scheduled Meds:  aspirin  EC  81 mg Oral Daily   atorvastatin   40 mg Oral Daily   cholecalciferol   1,000 Units Oral q  morning   empagliflozin   10 mg Oral QAC breakfast   ezetimibe   10 mg Oral Daily   furosemide   40 mg Intravenous Q8H   heparin   5,000 Units Subcutaneous Q8H   insulin  aspart  0-15 Units Subcutaneous TID WC   insulin  NPH Human  20 Units Subcutaneous BID AC   levothyroxine   50 mcg Oral Daily   sodium chloride  flush  3 mL Intravenous Q12H   sodium chloride  flush  3 mL Intravenous Q12H   spironolactone   25 mg Oral Daily   Continuous Infusions:   LOS: 2 days    Time spent:    Sigurd Pac, MD [facility]   05/29/2024, 10:20 AM    "

## 2024-05-29 NOTE — Plan of Care (Signed)
" °  Problem: Education: Goal: Knowledge of General Education information will improve Description: Including pain rating scale, medication(s)/side effects and non-pharmacologic comfort measures Outcome: Progressing   Problem: Health Behavior/Discharge Planning: Goal: Ability to manage health-related needs will improve Outcome: Progressing   Problem: Clinical Measurements: Goal: Will remain free from infection Outcome: Progressing   Problem: Safety: Goal: Ability to remain free from injury will improve Outcome: Progressing   Problem: Metabolic: Goal: Ability to maintain appropriate glucose levels will improve Outcome: Progressing   Problem: Nutritional: Goal: Maintenance of adequate nutrition will improve Outcome: Progressing   Problem: Skin Integrity: Goal: Risk for impaired skin integrity will decrease Outcome: Progressing   "

## 2024-05-30 DIAGNOSIS — I5033 Acute on chronic diastolic (congestive) heart failure: Secondary | ICD-10-CM | POA: Diagnosis not present

## 2024-05-30 LAB — BASIC METABOLIC PANEL WITH GFR
Anion gap: 5 (ref 5–15)
BUN: 36 mg/dL — ABNORMAL HIGH (ref 8–23)
CO2: 45 mmol/L — ABNORMAL HIGH (ref 22–32)
Calcium: 9.7 mg/dL (ref 8.9–10.3)
Chloride: 93 mmol/L — ABNORMAL LOW (ref 98–111)
Creatinine, Ser: 1.47 mg/dL — ABNORMAL HIGH (ref 0.61–1.24)
GFR, Estimated: 49 mL/min — ABNORMAL LOW
Glucose, Bld: 104 mg/dL — ABNORMAL HIGH (ref 70–99)
Potassium: 3.9 mmol/L (ref 3.5–5.1)
Sodium: 143 mmol/L (ref 135–145)

## 2024-05-30 LAB — GLUCOSE, CAPILLARY
Glucose-Capillary: 121 mg/dL — ABNORMAL HIGH (ref 70–99)
Glucose-Capillary: 251 mg/dL — ABNORMAL HIGH (ref 70–99)
Glucose-Capillary: 211 mg/dL — ABNORMAL HIGH (ref 70–99)
Glucose-Capillary: 209 mg/dL — ABNORMAL HIGH (ref 70–99)

## 2024-05-30 MED ORDER — TORSEMIDE 20 MG PO TABS
40.0000 mg | ORAL_TABLET | Freq: Every day | ORAL | Status: DC
  Administered 2024-05-30 – 2024-05-31 (×2): 40 mg via ORAL
  Filled 2024-05-30 (×2): qty 2

## 2024-05-30 NOTE — Progress Notes (Signed)
 " PROGRESS NOTE    Robert Ramirez  FMW:[id number] DOB: August 22, 1945 DOA: 05/26/2024 PCP: Jaycee Greig PARAS, NP  78/M with CAD, COPD, chronic respiratory failure, end-stage diastolic CHF/RV failure/cor pulmonal, history of asbestosis exposure with pleural plaques, prior tobacco use followed by advanced heart failure clinic presented to the ED with worsening dyspnea over 2 to 3 days, cough.  Compliant with diuretics, also cooks most of his meals, recent use of sauerkraut.  In the ED BNP 1659, troponin 87, 92, creatinine 1.2, chest x-ray with pleural effusions and scarring Admitted, started on diuretics  Subjective: Feels better overall, breathing much better, swelling is improving as well  Assessment and Plan:  Acute on chronic diastolic CHF End-stage RV failure, cor pulmonale - Admitted with volume overload,  - Last echo 12/07/2023, EF 60 to 65%, normal LV function, mild LVH, right RV overload, mildly increased in size, moderate elevated pulmonary artery hypertension - Right heart cath 7/25 concerning for severe pulmonary hypertension with advanced RV failure - Improving with diuresis, 8.3 L negative, appears close to euvolemic will switch to oral torsemide   - Continue Jardiance  and Aldactone  - Discussed overall prognosis and disease burden, agreeable to DNR, will send palliative care referral at discharge - Home tomorrow if stable, increase activity  Elevated troponin - Secondary to demand ischemia, from above, no ACS  History of permanent cardiac pacemaker placement Noted-functional  CKD 3 Stable  Acquired hypothyroidism Home dose Synthroid , check TSH  Chewing tobacco use Counseled regarding tobacco use and abuse  CAD Continue aspirin , Lipitor, statins, Zetia   COPD -On O2 2 L at baseline Previous outpatient workup ruled out IDL, chronic obstructive asthma noted Continue as needed DuoNebs  Diabetes (HCC) On Jardiance , and Humulin -NPH Holding metformin  CBGs are stable  Mixed  hyperlipidemia Continue statin   DVT prophylaxis: Heparin  subcutaneous Code Status: Discussed CODE STATUS with the patient, he is agreeable to DNR Family Communication: No family at bedside Disposition Plan: Home likely 1 to 2 days  Consultants:    Procedures:   Antimicrobials:    Objective: Vitals:   05/29/24 1900 05/29/24 2300 05/30/24 0300 05/30/24 0831  BP: 130/67 (!) 95/54 117/68 (!) 115/51  Pulse: 81 80 94 85  Resp: 20 19 19 18   Temp: 98.5 F (36.9 C) 98.2 F (36.8 C) 98.2 F (36.8 C) 97.8 F (36.6 C)  TempSrc: Oral Oral Oral Oral  SpO2: 100% 95% 92% 95%  Weight:   85.8 kg   Height:   6' 1 (1.854 m)     Intake/Output Summary (Last 24 hours) at 05/30/2024 0951 Last data filed at 05/30/2024 9171 Gross per 24 hour  Intake 1080 ml  Output 4975 ml  Net -3895 ml   Filed Weights   05/28/24 0423 05/29/24 0351 05/30/24 0300  Weight: 86.5 kg 87.3 kg 85.8 kg    Examination:  General exam: Pleasant chronically ill-appearing, AAO x 3, no distress HEENT: No JVD CVS: S1-S2, regular rhythm Lungs: Poor air movement otherwise clear Abdomen: Soft, nontender, bowel sounds present  extremities: Trace edema, Unna boots on  Skin: No rashes Psychiatry:  Mood & affect appropriate.     Data Reviewed:   CBC: Recent Labs  Lab 05/26/24 1605 05/27/24 0500  WBC 9.8 8.1  NEUTROABS 7.7 6.1  HGB 12.4* 12.5*  HCT 39.3 38.9*  MCV 91.8 91.1  PLT 215 199   Basic Metabolic Panel: Recent Labs  Lab 05/26/24 1605 05/27/24 0500 05/27/24 0824 05/28/24 0309 05/29/24 0205 05/30/24 0310  NA 140 142  --  144 143 143  K 4.2 3.5  --  3.4* 3.7 3.9  CL 88* 91*  --  93* 93* 93*  CO2 41* 41*  --  44* >45* 45*  GLUCOSE 115* 136*  --  112* 86 104*  BUN 29* 29*  --  31* 31* 36*  CREATININE 1.26* 1.25*  --  1.27* 1.26* 1.47*  CALCIUM  9.5 9.6  --  9.5 9.5 9.7  MG  --  1.6* 1.5*  --   --   --   PHOS  --   --  3.4  --   --   --    GFR: Estimated Creatinine Clearance: 47.6 mL/min  (A) (by C-G formula based on SCr of 1.47 mg/dL (H)). Liver Function Tests: Recent Labs  Lab 05/27/24 0500  AST 22  ALT 11  ALKPHOS 146*  BILITOT 1.0  PROT 6.7  ALBUMIN 3.8   No results for input(s): LIPASE, AMYLASE in the last 168 hours. No results for input(s): AMMONIA in the last 168 hours. Coagulation Profile: Recent Labs  Lab 05/27/24 2253  INR 1.0   Cardiac Enzymes: No results for input(s): CKTOTAL, CKMB, CKMBINDEX, TROPONINI in the last 168 hours. BNP (last 3 results) Recent Labs    05/27/24 0500 05/28/24 0309 05/29/24 0205  PROBNP 1,364.0* 1,584.0* 1,665.0*   HbA1C: No results for input(s): HGBA1C in the last 72 hours. CBG: Recent Labs  Lab 05/29/24 0625 05/29/24 1102 05/29/24 1644 05/29/24 2056 05/30/24 0519  GLUCAP 132* 234* 121* 211* 121*   Lipid Profile: No results for input(s): CHOL, HDL, LDLCALC, TRIG, CHOLHDL, LDLDIRECT in the last 72 hours. Thyroid  Function Tests: No results for input(s): TSH, T4TOTAL, FREET4, T3FREE, THYROIDAB in the last 72 hours. Anemia Panel: No results for input(s): VITAMINB12, FOLATE, FERRITIN, TIBC, IRON, RETICCTPCT in the last 72 hours. Urine analysis:    Component Value Date/Time   COLORURINE YELLOW 12/07/2023 0839   APPEARANCEUR CLEAR 12/07/2023 0839   LABSPEC 1.022 12/07/2023 0839   PHURINE 5.0 12/07/2023 0839   GLUCOSEU >=500 (A) 12/07/2023 0839   HGBUR NEGATIVE 12/07/2023 0839   BILIRUBINUR NEGATIVE 12/07/2023 0839   KETONESUR NEGATIVE 12/07/2023 0839   PROTEINUR NEGATIVE 12/07/2023 0839   NITRITE NEGATIVE 12/07/2023 0839   LEUKOCYTESUR NEGATIVE 12/07/2023 0839   Sepsis Labs: @LABRCNTIP (procalcitonin:4,lacticidven:4)  )No results found for this or any previous visit (from the past 240 hours).   Radiology Studies: No results found.    Scheduled Meds:  aspirin  EC  81 mg Oral Daily   atorvastatin   40 mg Oral Daily   cholecalciferol   1,000 Units Oral q  morning   empagliflozin   10 mg Oral QAC breakfast   ezetimibe   10 mg Oral Daily   heparin   5,000 Units Subcutaneous Q8H   insulin  aspart  0-15 Units Subcutaneous TID WC   insulin  NPH Human  20 Units Subcutaneous BID AC   levothyroxine   50 mcg Oral Daily   sodium chloride  flush  3 mL Intravenous Q12H   sodium chloride  flush  3 mL Intravenous Q12H   spironolactone   25 mg Oral Daily   torsemide   40 mg Oral Daily   Continuous Infusions:   LOS: 3 days    Time spent:    Sigurd Pac, MD [facility]   05/30/2024, 9:51 AM    "

## 2024-05-30 NOTE — Care Management Important Message (Signed)
 Important Message  Patient Details  Name: Robert Ramirez MRN: [id number] Date of Birth: 1946-01-28   Important Message Given:  Yes - Medicare IM     Robert Ramirez 05/30/2024, 10:39 AM

## 2024-05-30 NOTE — Plan of Care (Signed)
   Problem: Health Behavior/Discharge Planning: Goal: Ability to manage health-related needs will improve Outcome: Progressing   Problem: Clinical Measurements: Goal: Ability to maintain clinical measurements within normal limits will improve Outcome: Progressing Goal: Will remain free from infection Outcome: Progressing

## 2024-05-30 NOTE — Plan of Care (Signed)
 Patient ID: Robert Ramirez, male   DOB: 28-Apr-1946, 79 y.o.   MRN: [id number]  Problem: Education: Goal: Knowledge of General Education information will improve Description: Including pain rating scale, medication(s)/side effects and non-pharmacologic comfort measures Outcome: Progressing   Problem: Health Behavior/Discharge Planning: Goal: Ability to manage health-related needs will improve Outcome: Progressing   Problem: Clinical Measurements: Goal: Ability to maintain clinical measurements within normal limits will improve Outcome: Progressing Goal: Will remain free from infection Outcome: Progressing Goal: Diagnostic test results will improve Outcome: Progressing Goal: Respiratory complications will improve Outcome: Progressing Goal: Cardiovascular complication will be avoided Outcome: Progressing   Problem: Activity: Goal: Risk for activity intolerance will decrease Outcome: Progressing   Problem: Nutrition: Goal: Adequate nutrition will be maintained Outcome: Progressing   Problem: Coping: Goal: Level of anxiety will decrease Outcome: Progressing   Problem: Elimination: Goal: Will not experience complications related to bowel motility Outcome: Progressing Goal: Will not experience complications related to urinary retention Outcome: Progressing   Problem: Safety: Goal: Ability to remain free from injury will improve Outcome: Progressing   Problem: Skin Integrity: Goal: Risk for impaired skin integrity will decrease Outcome: Progressing   Problem: Education: Goal: Ability to describe self-care measures that may prevent or decrease complications (Diabetes Survival Skills Education) will improve Outcome: Progressing Goal: Individualized Educational Video(s) Outcome: Progressing   Problem: Coping: Goal: Ability to adjust to condition or change in health will improve Outcome: Progressing   Problem: Fluid Volume: Goal: Ability to maintain a balanced intake and  output will improve Outcome: Progressing   Problem: Health Behavior/Discharge Planning: Goal: Ability to identify and utilize available resources and services will improve Outcome: Progressing Goal: Ability to manage health-related needs will improve Outcome: Progressing   Problem: Metabolic: Goal: Ability to maintain appropriate glucose levels will improve Outcome: Progressing   Problem: Nutritional: Goal: Maintenance of adequate nutrition will improve Outcome: Progressing Goal: Progress toward achieving an optimal weight will improve Outcome: Progressing   Problem: Skin Integrity: Goal: Risk for impaired skin integrity will decrease Outcome: Progressing   Problem: Tissue Perfusion: Goal: Adequacy of tissue perfusion will improve Outcome: Progressing    [name], RN

## 2024-05-30 NOTE — TOC Progression Note (Signed)
 Transition of Care (TOC) - Progression Note   Patient from home, lives alone but son lives in an apartment in patient's garage across the yard .   Patient has home oxygen  with [employer]. Son will provide education at discharge and bring portable oxygen  tank.   Patient currently has [facility] for home health.   Patient has unna boots that need to be changed Monday and Thursday. Adriana with [facility] aware   Per Dr Fairy rockers boots will be discontinued prior to discharge. Adriana with [facility] updated. Adriana states they do not need updated orders to resume care. They just need to be notified when patient discharges. Adriana aware anticipated discharge is tomorrow.   Patient Details  Name: Robert Ramirez MRN: [id number] Date of Birth: 1945/06/25  [facility]) CM/SW Contact  Zakarie Sturdivant, Powell Jansky, RN Phone Number: 05/30/2024, 11:47 AM  Clinical Narrative:       Expected Discharge Plan: Home w Home Health Services Barriers to Discharge: Continued Medical Work up               Expected Discharge Plan and Services   Discharge Planning Services: CM Consult Post Acute Care Choice: Home Health Living arrangements for the past 2 months: Single Family Home                 DME Arranged: N/A DME Agency: NA         HH Agency: [facility] Date [facility] Agency Contacted: 05/30/24 Time HH Agency Contacted: 1146 Representative spoke with at [facility] Agency: Adriana   Social Drivers of Health (SDOH) Interventions SDOH Screenings   Food Insecurity: No Food Insecurity (05/30/2024)  Housing: Low Risk (05/30/2024)  Transportation Needs: No Transportation Needs (05/30/2024)  Utilities: Not At Risk (05/30/2024)  Alcohol Screen: Low Risk (08/06/2023)  Depression (PHQ2-9): Low Risk (01/12/2024)  Financial Resource Strain: Low Risk (08/06/2023)  Physical Activity: Sufficiently Active (08/06/2023)  Social Connections: Socially Isolated (05/30/2024)  Stress: No Stress Concern Present (08/06/2023)   Tobacco Use: High Risk (05/26/2024)  Health Literacy: Adequate Health Literacy (08/06/2023)    Readmission Risk Interventions     No data to display

## 2024-05-30 NOTE — Plan of Care (Signed)
" °  Problem: Health Behavior/Discharge Planning: Goal: Ability to manage health-related needs will improve 05/30/2024 2315 by Janson Lamar K, RN Outcome: Progressing 05/30/2024 2110 by Merilyn Merlynn POUR, RN Outcome: Progressing   Problem: Clinical Measurements: Goal: Ability to maintain clinical measurements within normal limits will improve 05/30/2024 2315 by Shawne Eskelson K, RN Outcome: Progressing 05/30/2024 2110 by Yaakov Saindon K, RN Outcome: Progressing Goal: Will remain free from infection 05/30/2024 2315 by Merilyn Merlynn POUR, RN Outcome: Progressing 05/30/2024 2110 by Merilyn Merlynn POUR, RN Outcome: Progressing   "

## 2024-05-30 NOTE — Progress Notes (Signed)
 Mobility Specialist Progress Note:    05/30/24 1154  Mobility  Activity Ambulated with assistance  Level of Assistance Independent after set-up  Assistive Device None  Distance Ambulated (ft) 100 ft  Range of Motion/Exercises Active  Activity Response Tolerated well  Mobility Referral Yes  Mobility visit 1 Mobility  Mobility Specialist Start Time (ACUTE ONLY) 1154  Mobility Specialist Stop Time (ACUTE ONLY) 1203  Mobility Specialist Time Calculation (min) (ACUTE ONLY) 9 min   Received pt in recliner agreeable to session. No c/o any symptoms. Pt moving and ambulating well. Returned pt to recliner w/ all needs met.   Venetia Keel Mobility Specialist Please Neurosurgeon or Rehab Office at [phone]

## 2024-05-30 NOTE — TOC CM/SW Note (Signed)
 Left Adriana with [facility] a voicemail and called office they are in a meeting and will call NCM back

## 2024-05-31 ENCOUNTER — Other Ambulatory Visit (HOSPITAL_COMMUNITY): Payer: Self-pay

## 2024-05-31 LAB — BASIC METABOLIC PANEL WITH GFR
Anion gap: 7 (ref 5–15)
BUN: 43 mg/dL — ABNORMAL HIGH (ref 8–23)
CO2: 44 mmol/L — ABNORMAL HIGH (ref 22–32)
Calcium: 9.7 mg/dL (ref 8.9–10.3)
Chloride: 92 mmol/L — ABNORMAL LOW (ref 98–111)
Creatinine, Ser: 1.54 mg/dL — ABNORMAL HIGH (ref 0.61–1.24)
GFR, Estimated: 46 mL/min — ABNORMAL LOW
Glucose, Bld: 146 mg/dL — ABNORMAL HIGH (ref 70–99)
Potassium: 3.9 mmol/L (ref 3.5–5.1)
Sodium: 143 mmol/L (ref 135–145)

## 2024-05-31 LAB — GLUCOSE, CAPILLARY: Glucose-Capillary: 185 mg/dL — ABNORMAL HIGH (ref 70–99)

## 2024-05-31 MED ORDER — TORSEMIDE 20 MG PO TABS
20.0000 mg | ORAL_TABLET | Freq: Two times a day (BID) | ORAL | 1 refills | Status: DC
  Filled 2024-05-31: qty 90, 23d supply, fill #0

## 2024-05-31 MED ORDER — SPIRONOLACTONE 25 MG PO TABS
25.0000 mg | ORAL_TABLET | Freq: Every day | ORAL | 0 refills | Status: AC
  Filled 2024-05-31: qty 30, 30d supply, fill #0

## 2024-05-31 MED ORDER — POTASSIUM CHLORIDE CRYS ER 20 MEQ PO TBCR
20.0000 meq | EXTENDED_RELEASE_TABLET | Freq: Every day | ORAL | 0 refills | Status: DC
  Filled 2024-05-31: qty 30, 30d supply, fill #0

## 2024-05-31 NOTE — TOC Transition Note (Signed)
 Transition of Care [facility]) - Discharge Note   Patient Details  Name: Robert Ramirez MRN: [id number] Date of Birth: 01-12-1946  Transition of [facility]) CM/SW Contact:  Waddell Barnie Rama, RN Phone Number: 05/31/2024, 8:27 AM   Clinical Narrative:    For dc today, NCM notified Adriana with [facility] of dc.   Final next level of care: Home w Home Health Services Barriers to Discharge: Continued Medical Work up   Patient Goals and CMS Choice Patient states their goals for this hospitalization and ongoing recovery are:: to return to home CMS Medicare.gov Compare Post Acute Care list provided to:: Patient Choice offered to / list presented to : Patient      Discharge Placement                       Discharge Plan and Services Additional resources added to the After Visit Summary for     Discharge Planning Services: CM Consult Post Acute Care Choice: Home Health          DME Arranged: N/A DME Agency: NA         HH Agency: [facility] Date [facility] Agency Contacted: 05/30/24 Time HH Agency Contacted: 1146 Representative spoke with at [facility] Agency: Adriana  Social Drivers of Health (SDOH) Interventions SDOH Screenings   Food Insecurity: No Food Insecurity (05/30/2024)  Housing: Low Risk (05/30/2024)  Transportation Needs: No Transportation Needs (05/30/2024)  Utilities: Not At Risk (05/30/2024)  Alcohol Screen: Low Risk (08/06/2023)  Depression (PHQ2-9): Low Risk (01/12/2024)  Financial Resource Strain: Low Risk (08/06/2023)  Physical Activity: Sufficiently Active (08/06/2023)  Social Connections: Socially Isolated (05/30/2024)  Stress: No Stress Concern Present (08/06/2023)  Tobacco Use: High Risk (05/26/2024)  Health Literacy: Adequate Health Literacy (08/06/2023)     Readmission Risk Interventions     No data to display

## 2024-06-01 ENCOUNTER — Telehealth: Payer: Self-pay | Admitting: *Deleted

## 2024-06-01 NOTE — Transitions of Care (Post Inpatient/ED Visit) (Signed)
" ° °  06/01/2024  Name: Robert Ramirez MRN: [id number] DOB: 10-21-1945  Today's TOC FU Call Status: Today's TOC FU Call Status:: Unsuccessful Call (1st Attempt) Unsuccessful Call (1st Attempt) Date: 06/01/24  Attempted to reach the patient regarding the most recent Inpatient/ED visit.  Follow Up Plan: Additional outreach attempts will be made to reach the patient to complete the Transitions of Care (Post Inpatient/ED visit) call.   Andrea Dimes RN, BSN [place]  [facility] RN Care Manager [phone]  "

## 2024-06-02 ENCOUNTER — Telehealth: Payer: Self-pay | Admitting: *Deleted

## 2024-06-02 ENCOUNTER — Telehealth: Payer: Self-pay | Admitting: Emergency Medicine

## 2024-06-02 NOTE — Transitions of Care (Post Inpatient/ED Visit) (Signed)
 "  06/02/2024  Name: Robert Ramirez MRN: [id number] DOB: 05-13-1946  Today's TOC FU Call Status: Today's TOC FU Call Status:: Successful TOC FU Call Completed TOC FU Call Complete Date: 06/02/24  Patient's Name and Date of Birth confirmed. Name, DOB  Transition Care Management Follow-up Telephone Call Date of Discharge: 05/30/24 Discharge Facility: [facility]) Type of Discharge: Inpatient Admission Primary Inpatient Discharge Diagnosis:: Acute on chronic diastolic heart failure How have you been since you were released from the hospital?: Better Any questions or concerns?: No  Items Reviewed: Did you receive and understand the discharge instructions provided?: Yes Medications obtained,verified, and reconciled?: Yes (Medications Reviewed) Any new allergies since your discharge?: No Dietary orders reviewed?: Yes Type of Diet Ordered:: Heart Healthy, low sodium Do you have support at home?: Yes People in Home [RPT]: alone Name of Support/Comfort Primary Source: Step son lives on the property and is supportive  Medications Reviewed Today: Medications Reviewed Today     Reviewed by Lucky Andrea LABOR, RN (Registered Nurse) on 06/02/24 at 1223  Med List Status: <None>   Medication Order Taking? Sig Documenting Provider Last Dose Status Informant  albuterol  (VENTOLIN  HFA) 108 (90 Base) MCG/ACT inhaler [id number] Yes INHALE 2 PUFFS BY MOUTH EVERY 6 HOURS AS NEEDED FOR WHEEZING FOR SHORTNESS OF BREATH Hope Almarie ORN, NP  Active Self, Pharmacy Records  aspirin  EC 81 MG tablet [id number] Yes Take 81 mg by mouth daily. Swallow whole. [provider]  Active Self, Pharmacy Records  atorvastatin  (LIPITOR) 40 MG tablet [id number] Yes Take 40 mg by mouth daily. [provider]  Active Self, Pharmacy Records  blood glucose meter kit and supplies [id number] Yes Dispense based on patient and insurance preference. Use up to four times daily as directed. (FOR ICD-10 E10.9,  E11.9). Jaycee Greig PARAS, NP  Active Self, Pharmacy Records  Cholecalciferol  (VITAMIN D -3) 25 MCG (1000 UT) CAPS [id number] Yes Take 1 capsule by mouth every morning. [provider]  Active Self, Pharmacy Records  empagliflozin  (JARDIANCE ) 10 MG TABS tablet [id number] Yes Take 1 tablet (10 mg total) by mouth daily before breakfast. Thukkani, Arun K, MD  Active Self, Pharmacy Records  ezetimibe  (ZETIA ) 10 MG tablet [id number] Yes Take 1 tablet (10 mg total) by mouth daily. Thukkani, Arun K, MD  Active Self, Pharmacy Records  gentamicin  ointment (GARAMYCIN ) 0.1 % [id number] Yes Apply 1 Application topically daily. Apply to wound daily McDonald, Juliene SAUNDERS, DPM  Active Self, Pharmacy Records  Insulin  NPH, Human,, Isophane, (HUMULIN  N KWIKPEN) 100 UNIT/ML Al[place] [id number] Yes Inject 33 Units into the skin in the morning and at bedtime. Newlin, Enobong, MD  Active Self, Pharmacy Records           Med Note ANNELL, JEANNINE HERO   Wed Feb 24, 2024  1:38 PM) 20-33 units Bid  Insulin  Pen Needle (DROPLET PEN NEEDLES) 31G X 8 MM MISC [id number] Yes 1 each by Other route 2 (two) times daily with a meal. Jaycee Greig PARAS, NP  Active Self, Pharmacy Records  levothyroxine  (SYNTHROID ) 50 MCG tablet [id number] Yes Take 1 tablet (50 mcg total) by mouth daily. Jaycee Greig PARAS, NP  Active Self, Pharmacy Records  metFORMIN  (GLUCOPHAGE ) 1000 MG tablet [id number] Yes Take 1 tablet (1,000 mg total) by mouth 2 (two) times daily with a meal. Jaycee Greig PARAS, NP  Active Self, Pharmacy Records  nitroGLYCERIN  (NITROSTAT ) 0.4 MG SL tablet [id number]  Place 1 tablet (0.4 mg total) under the tongue every 5 (five) minutes as  needed for chest pain.  Patient not taking: Reported on 06/02/2024   Thukkani, Arun K, MD  Active Self, Pharmacy Records           Med Note [facility], DONETA RAMAN   Fri May 27, 2024  5:52 AM) Has never needed, but kept on hand  potassium chloride  SA (KLOR-CON  M) 20 MEQ tablet [id number] Yes Take 1 tablet (20 mEq total) by mouth daily.  Fairy Frames, MD  Active   spironolactone  (ALDACTONE ) 25 MG tablet [id number] Yes Take 1 tablet (25 mg total) by mouth daily. Fairy Frames, MD  Active   torsemide  (DEMADEX ) 20 MG tablet [id number] Yes Take 1-2 tablets (20-40 mg total) by mouth 2 (two) times daily. Take 40mg  in am, and 20mg  in afternoon Fairy Frames, MD  Active   vitamin B-12 (CYANOCOBALAMIN ) 500 MCG tablet [id number] Yes Take 500 mcg by mouth daily. [provider]  Active Self, Pharmacy Records            Home Care and Equipment/Supplies: Were Home Health Services Ordered?: Yes Name of Home Health Agency:: [facility] Has Agency set up a time to come to your home?: Yes First Home Health Visit Date: 06/02/24 Any new equipment or medical supplies ordered?: No  Functional Questionnaire: Do you need assistance with bathing/showering or dressing?: No Do you need assistance with meal preparation?: No Do you need assistance with eating?: No Do you have difficulty maintaining continence: No Do you need assistance with getting out of bed/getting out of a chair/moving?: No Do you have difficulty managing or taking your medications?: No  Follow up appointments reviewed: PCP Follow-up appointment confirmed?: No (RNCM assisted with scheduling on 06/16/24) MD Provider Line [phone] Given: No Specialist Hospital Follow-up appointment confirmed?: Yes Date of Specialist follow-up appointment?: 06/09/24 Follow-Up Specialty Provider:: Heart and Vascular Do you need transportation to your follow-up appointment?: No Do you understand care options if your condition(s) worsen?: Yes-patient verbalized understanding  SDOH Interventions Today    Flowsheet Row Most Recent Value  SDOH Interventions   Food Insecurity Interventions Intervention Not Indicated  Housing Interventions Intervention Not Indicated  Transportation Interventions Intervention Not Indicated  Utilities Interventions Intervention Not Indicated     Andrea Dimes RN, BSN [place]  [facility] RN Care Manager [phone]  "

## 2024-06-02 NOTE — Telephone Encounter (Signed)
 Copied from CRM #[id number]. Topic: General - Other >> Jun 02, 2024 10:39 AM Avram MATSU wrote: Reason for CRM: patient was discharged on 06/01/23 and nurse stated he gained 10 pounds and once her was at the hospital its was 187. With congestive heart failure [place] wanted to report this, please advise [phone] would like a callback.

## 2024-06-03 NOTE — Telephone Encounter (Signed)
 Copied from CRM #[id number]. Topic: Clinical - Home Health Verbal Orders >> Jun 02, 2024  5:06 PM Sophia H wrote: Caller/Agency: [facility] nurse  Callback Number: [phone] (vmail secured) Service Requested: Skilled Nursing Frequency: Patient is back at home, need verbals to start care back up. (Skilled nursing) Also requesting clarification on wound care orders. Any new concerns about the patient? Yes, weight gain of 10 lbs in 2 days. Potassium level severity of 2 - interacting with medication

## 2024-06-03 NOTE — Telephone Encounter (Signed)
 Please report to established [facility]. During the interim report to the Emergency Department/Urgent Care/call 911 for immediate medical evaluation.

## 2024-06-06 ENCOUNTER — Encounter

## 2024-06-08 ENCOUNTER — Other Ambulatory Visit: Payer: Self-pay | Admitting: Internal Medicine

## 2024-06-08 ENCOUNTER — Telehealth (HOSPITAL_COMMUNITY): Payer: Self-pay

## 2024-06-08 NOTE — Telephone Encounter (Addendum)
 Called to confirm/remind patient of their appointment at the [facility] on 06/09/24 10:00.   Appointment:   [] Confirmed  [] Left mess   [x] No answer/No voice mail  [] VM Full/unable to leave message  [] Phone not in service  Patient reminded to bring all medications and/or complete list.  Confirmed patient has transportation. Gave directions, instructed to utilize valet parking.

## 2024-06-08 NOTE — Progress Notes (Signed)
 "  [facility] Note   PCP: Jaycee Greig PARAS, NP Cardiologist: Lurena MARLA Red, MD  HF Cardiologist: Dr. Cherrie  HPI: 79 y.o. male with history of CAD, chronic HFpEF with RV failure, DM, HTN, HLD, OSA and chronic respiratory failure on BiPAP, CHB s/p dual chamber PPM with LBBAP lead in 01/25, hx asbestos exposure with pleural plaque on prior HRCT but no ILD, remote tobacco use.    Appears to be evidence of RV dysfunction on echo dating back to 2019. L/RHC in 02/2018: Nonobstructive CAD involving RCA, PCWP mean 8, PA 46/16 (26), Fick CO/CI 4.45/2.07, TD CO/CI 3.64/1.69.    Admitted for acute decompensated HFpEF and RV failure 12/07/23. Echo showed EF 60-65%, interventricular septum is flattened, RV enlarged and HK, RVSP 58 mmHg, moderate TR. RHC showed RA 16, mPA 34, PCWP 11, TD CO/CI 3.8/1.8, PVR 6.0. Required ICU stay for BiPAP and inotrope/pressor support.   Seen by PCP 12/25/23. Noted to by hypotensive 74/38, asymptomatic. BUN >100. Referred to nutrition, ENT for dysphagia, and nephrology.   Had debridement on 04/07/24. Did not want any further surgeries. Saw VVS and was offered lower extremity angiogram, however patient declined invasive treatments. Followed by palliative care.   Admitted 1/26 with A/C HFpEF. Diuresed well with IV lasix . Code status changed to DNR. Discharged with palliative care referral for OP f/u.   Today he returns for AHF follow up. Overall feeling ok. Denies palpitations, CP, or PND/Orthopnea. Chronic BLE edema. Persistent dizziness over the last few years. Intt SOB, currently on 2L [place]. Appetite is good. No fever or chills. Weight at home 203 pounds. Taking all medications but struggling to keep up with refills and arranging pill box as some days his vision is poor. Denies ETOH, tobacco or drug use.    Past Medical History:  Diagnosis Date   Diabetes mellitus    High cholesterol    Hypertension    Peripheral arterial disease    Current Outpatient  Medications  Medication Sig Dispense Refill   albuterol  (VENTOLIN  HFA) 108 (90 Base) MCG/ACT inhaler INHALE 2 PUFFS BY MOUTH EVERY 6 HOURS AS NEEDED FOR WHEEZING FOR SHORTNESS OF BREATH 9 g 5   aspirin  EC 81 MG tablet Take 81 mg by mouth daily. Swallow whole.     atorvastatin  (LIPITOR) 40 MG tablet Take 40 mg by mouth daily.     blood glucose meter kit and supplies Dispense based on patient and insurance preference. Use up to four times daily as directed. (FOR ICD-10 E10.9, E11.9). 1 each 0   Cholecalciferol  (VITAMIN D -3) 25 MCG (1000 UT) CAPS Take 1 capsule by mouth every morning.     empagliflozin  (JARDIANCE ) 10 MG TABS tablet Take 1 tablet (10 mg total) by mouth daily before breakfast. 90 tablet 3   ezetimibe  (ZETIA ) 10 MG tablet Take 1 tablet (10 mg total) by mouth daily. 90 tablet 3   gentamicin  ointment (GARAMYCIN ) 0.1 % Apply 1 Application topically daily. Apply to wound daily 30 g 0   Insulin  NPH, Human,, Isophane, (HUMULIN  N KWIKPEN) 100 UNIT/ML Kiwkpen Inject 33 Units into the skin in the morning and at bedtime. 60 mL 1   Insulin  Pen Needle (DROPLET PEN NEEDLES) 31G X 8 MM MISC 1 each by Other route 2 (two) times daily with a meal. 200 each 0   levothyroxine  (SYNTHROID ) 50 MCG tablet Take 1 tablet (50 mcg total) by mouth daily. 90 tablet 0   metFORMIN  (GLUCOPHAGE ) 1000 MG tablet Take 1 tablet (  1,000 mg total) by mouth 2 (two) times daily with a meal. 180 tablet 0   nitroGLYCERIN  (NITROSTAT ) 0.4 MG SL tablet Place 1 tablet (0.4 mg total) under the tongue every 5 (five) minutes as needed for chest pain. 25 tablet 3   potassium chloride  SA (KLOR-CON  M) 20 MEQ tablet Take 1 tablet (20 mEq total) by mouth daily. 30 tablet 0   spironolactone  (ALDACTONE ) 25 MG tablet Take 1 tablet (25 mg total) by mouth daily. 30 tablet 0   torsemide  (DEMADEX ) 20 MG tablet Take 1-2 tablets (20-40 mg total) by mouth 2 (two) times daily. Take 40mg  in am, and 20mg  in afternoon 90 tablet 1   vitamin B-12  (CYANOCOBALAMIN ) 500 MCG tablet Take 500 mcg by mouth daily.     No current facility-administered medications for this encounter.   No Known Allergies  Social History   Socioeconomic History   Marital status: Married    Spouse name: Not on file   Number of children: Not on file   Years of education: Not on file   Highest education level: Not on file  Occupational History   Not on file  Tobacco Use   Smoking status: Former    Current packs/day: 0.00    Types: Cigarettes    Quit date: 03/30/1980    Years since quitting: 44.2    Passive exposure: Never   Smokeless tobacco: Current    Types: Chew, Snuff  Vaping Use   Vaping status: Never Used  Substance and Sexual Activity   Alcohol use: No   Drug use: No   Sexual activity: Not Currently  Other Topics Concern   Not on file  Social History Narrative   Not on file   Social Drivers of Health   Tobacco Use: High Risk (06/09/2024)   Patient History    Smoking Tobacco Use: Former    Smokeless Tobacco Use: Current    Passive Exposure: Never  Physicist, Medical Strain: Low Risk (08/06/2023)   Overall Financial Resource Strain (CARDIA)    Difficulty of Paying Living Expenses: Not hard at all  Food Insecurity: No Food Insecurity (06/02/2024)   Epic    Worried About Radiation Protection Practitioner of Food in the Last Year: Never true    Ran Out of Food in the Last Year: Never true  Transportation Needs: No Transportation Needs (06/02/2024)   Epic    Lack of Transportation (Medical): No    Lack of Transportation (Non-Medical): No  Physical Activity: Sufficiently Active (08/06/2023)   Exercise Vital Sign    Days of Exercise per Week: 7 days    Minutes of Exercise per Session: 30 min  Stress: No Stress Concern Present (08/06/2023)   [employer] of Occupational Health - Occupational Stress Questionnaire    Feeling of Stress : Not at all  Social Connections: Socially Isolated (05/30/2024)   Social Connection and Isolation Panel    Frequency of  Communication with Friends and Family: More than three times a week    Frequency of Social Gatherings with Friends and Family: More than three times a week    Attends Religious Services: Never    Database Administrator or Organizations: No    Attends Banker Meetings: Never    Marital Status: Widowed  Intimate Partner Violence: Not At Risk (06/02/2024)   Epic    Fear of Current or Ex-Partner: No    Emotionally Abused: No    Physically Abused: No    Sexually Abused: No  Depression (  PHQ2-9): Low Risk (06/02/2024)   Depression (PHQ2-9)    PHQ-2 Score: 0  Alcohol Screen: Low Risk (08/06/2023)   Alcohol Screen    Last Alcohol Screening Score (AUDIT): 0  Housing: Unknown (06/02/2024)   Epic    Unable to Pay for Housing in the Last Year: No    Number of Times Moved in the Last Year: Not on file    Homeless in the Last Year: No  Utilities: Not At Risk (06/02/2024)   Epic    Threatened with loss of utilities: No  Health Literacy: Adequate Health Literacy (08/06/2023)   B1300 Health Literacy    Frequency of need for help with medical instructions: Never    Family History  Problem Relation Age of Onset   Heart failure Mother    Cancer Father    Diabetes Sister    Cancer Sister    Wt Readings from Last 3 Encounters:  06/09/24 93 kg (205 lb)  06/02/24 87.1 kg (192 lb)  05/31/24 85 kg (187 lb 6.3 oz)   BP (!) 109/56   Pulse 87   Wt 93 kg (205 lb)   SpO2 91%   BMI 27.05 kg/m   PHYSICAL EXAM: General:  chronically ill appearing.  +2L [place] Neck: JVD ~10 cm.  Cor: Regular rate & rhythm. No murmurs. Lungs: clear, diminished bases Extremities: +2 BLE edema. Boot to RLE Neuro: alert & oriented x 3. Affect pleasant.   ReDs reading: 44 %, abnormal   St. Scientist, Product/process Development (personally reviewed): AF/AT burden <1%, VP 97%, no CorVue   ASSESSMENT & PLAN: End-Stage Chronic HFpEF / PAH / Cor Pulmonale - Evidence of RV dysfunction on echo dating back to 2019.  - L/RHC in  10/19: Nonobstructive CAD involving RCA, PCW 8, PA 46/16 (26), Fick CO/CI 4.45/2.07, TD CO/CI 3.64/1.69.  - PFTs in 2020 with mixed restrictive and obstructive defect. CT w/o evidence of ILD. - Echo 7/25: EF 60-65%, interventricular septum is flattened, RV enlarged and HK, RVSP 58 mmHg, mod TR - RHC 7/25: RA 16, PA mean 34, PCWP 11, TD CO/CI 3.8/1.8, unable to read Fick, PVR 6.0, PAPi 1.4 - Given severe CO2 retention suspect most WHO Group 3 disease but may have component of WHO Group 1.  - Reports adherence with home BiPAP; On home O2 - NYHA IIIa-b. Volume overloaded on exam and by ReDS.  - Increase Torsemide  to 40 mg BID. Will not adjust KDUR today as he was previously off spiro 2/2 hyperkalemia but is back on it. Labs today, repeat at f/u next week.  - Continue Jardiance  10 mg daily. May need to stop with LE wounds, reports that this is healing well.  - Continue spiro 25 mg daily.  - Off lisinopril  and chlorthalidone  with hypotension - Will arrange for UNNA boots to be placed at home.   2. CKD IIIb - Baseline SCr 1.3-1.4. - Continue SGLT2i for now  3. PAD - chronic osteomyelitis to R foot - Follows with podiatry and VVS - Continue statin + ASA - Followed by wound clinic for RLE foot ulcer   4. CHB  - s/p dual chamber PPM w/ LBBAP lead  5. Paroxysmal Atrial Flutter - Device interrogation as above - Off amiodarone  with pulmonary disease   6. 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 Failure to Thrive - Per patient reports OP palliative care has not followed up with him yet.  -  DNR - Will arrange for paramedicine to help with medications. Struggles now with new insurance. Previously his insurance provided set up boxes for him but that is no longer an option.    Follow up next week with APP to reevaluate volume status.   Beckey LITTIE Coe, NP 06/09/24  "

## 2024-06-09 ENCOUNTER — Ambulatory Visit (HOSPITAL_COMMUNITY): Payer: Self-pay | Admitting: Internal Medicine

## 2024-06-09 ENCOUNTER — Telehealth (HOSPITAL_COMMUNITY): Payer: Self-pay

## 2024-06-09 ENCOUNTER — Ambulatory Visit (HOSPITAL_COMMUNITY)
Admit: 2024-06-09 | Discharge: 2024-06-09 | Disposition: A | Discharge status: Home / Self Care | Attending: Internal Medicine | Admitting: Internal Medicine

## 2024-06-09 ENCOUNTER — Encounter (HOSPITAL_COMMUNITY): Payer: Self-pay

## 2024-06-09 VITALS — BP 109/56 | HR 87 | Wt 205.0 lb

## 2024-06-09 DIAGNOSIS — N1832 Chronic kidney disease, stage 3b: Secondary | ICD-10-CM | POA: Insufficient documentation

## 2024-06-09 DIAGNOSIS — L989 Disorder of the skin and subcutaneous tissue, unspecified: Secondary | ICD-10-CM | POA: Insufficient documentation

## 2024-06-09 DIAGNOSIS — I2510 Atherosclerotic heart disease of native coronary artery without angina pectoris: Secondary | ICD-10-CM | POA: Diagnosis not present

## 2024-06-09 DIAGNOSIS — Z66 Do not resuscitate: Secondary | ICD-10-CM | POA: Diagnosis not present

## 2024-06-09 DIAGNOSIS — Z7982 Long term (current) use of aspirin: Secondary | ICD-10-CM | POA: Insufficient documentation

## 2024-06-09 DIAGNOSIS — I442 Atrioventricular block, complete: Secondary | ICD-10-CM | POA: Diagnosis not present

## 2024-06-09 DIAGNOSIS — E1151 Type 2 diabetes mellitus with diabetic peripheral angiopathy without gangrene: Secondary | ICD-10-CM | POA: Insufficient documentation

## 2024-06-09 DIAGNOSIS — I272 Pulmonary hypertension, unspecified: Secondary | ICD-10-CM

## 2024-06-09 DIAGNOSIS — Z794 Long term (current) use of insulin: Secondary | ICD-10-CM | POA: Diagnosis not present

## 2024-06-09 DIAGNOSIS — R627 Adult failure to thrive: Secondary | ICD-10-CM | POA: Diagnosis not present

## 2024-06-09 DIAGNOSIS — G4733 Obstructive sleep apnea (adult) (pediatric): Secondary | ICD-10-CM | POA: Insufficient documentation

## 2024-06-09 DIAGNOSIS — I2781 Cor pulmonale (chronic): Secondary | ICD-10-CM | POA: Diagnosis not present

## 2024-06-09 DIAGNOSIS — Z950 Presence of cardiac pacemaker: Secondary | ICD-10-CM | POA: Diagnosis not present

## 2024-06-09 DIAGNOSIS — E8770 Fluid overload, unspecified: Secondary | ICD-10-CM | POA: Insufficient documentation

## 2024-06-09 DIAGNOSIS — I739 Peripheral vascular disease, unspecified: Secondary | ICD-10-CM

## 2024-06-09 DIAGNOSIS — I5032 Chronic diastolic (congestive) heart failure: Secondary | ICD-10-CM | POA: Diagnosis not present

## 2024-06-09 DIAGNOSIS — I132 Hypertensive heart and chronic kidney disease with heart failure and with stage 5 chronic kidney disease, or end stage renal disease: Secondary | ICD-10-CM | POA: Diagnosis not present

## 2024-06-09 DIAGNOSIS — E1122 Type 2 diabetes mellitus with diabetic chronic kidney disease: Secondary | ICD-10-CM | POA: Insufficient documentation

## 2024-06-09 DIAGNOSIS — I4892 Unspecified atrial flutter: Secondary | ICD-10-CM | POA: Insufficient documentation

## 2024-06-09 DIAGNOSIS — I5084 End stage heart failure: Secondary | ICD-10-CM | POA: Diagnosis not present

## 2024-06-09 DIAGNOSIS — M86671 Other chronic osteomyelitis, right ankle and foot: Secondary | ICD-10-CM | POA: Insufficient documentation

## 2024-06-09 DIAGNOSIS — E11621 Type 2 diabetes mellitus with foot ulcer: Secondary | ICD-10-CM | POA: Insufficient documentation

## 2024-06-09 DIAGNOSIS — Z7984 Long term (current) use of oral hypoglycemic drugs: Secondary | ICD-10-CM | POA: Insufficient documentation

## 2024-06-09 DIAGNOSIS — F17290 Nicotine dependence, other tobacco product, uncomplicated: Secondary | ICD-10-CM | POA: Diagnosis not present

## 2024-06-09 LAB — BASIC METABOLIC PANEL WITH GFR
BUN: 46 mg/dL — ABNORMAL HIGH (ref 8–23)
CO2: >45 mmol/L — ABNORMAL HIGH (ref 22–32)
Calcium: 9.4 mg/dL (ref 8.9–10.3)
Chloride: 93 mmol/L — ABNORMAL LOW (ref 98–111)
Creatinine, Ser: 1.32 mg/dL — ABNORMAL HIGH (ref 0.61–1.24)
GFR, Estimated: 56 mL/min — ABNORMAL LOW
Glucose, Bld: 167 mg/dL — ABNORMAL HIGH (ref 70–99)
Potassium: 4.7 mmol/L (ref 3.5–5.1)
Sodium: 144 mmol/L (ref 135–145)

## 2024-06-09 LAB — PRO BRAIN NATRIURETIC PEPTIDE: Pro Brain Natriuretic Peptide: 1182 pg/mL — ABNORMAL HIGH

## 2024-06-09 MED ORDER — TORSEMIDE 20 MG PO TABS: 40.0000 mg | ORAL_TABLET | Freq: Two times a day (BID) | ORAL | 5 refills | Status: DC

## 2024-06-09 MED ORDER — EMPAGLIFLOZIN 10 MG PO TABS: 10.0000 mg | ORAL_TABLET | Freq: Every day | ORAL | 3 refills | Status: AC

## 2024-06-09 NOTE — Telephone Encounter (Signed)
 Attempted to reach Mr. Buchberger- his phone was disconnected.    I called his daughter in law and she said she will talk with him and have him call me- we planned a visit for Monday as long as he is in agreement.   Powell Mirza, EMT-Paramedic [phone] 06/09/2024

## 2024-06-09 NOTE — Progress Notes (Signed)
"   ReDS Vest / Clip - 06/09/24 1000       ReDS Vest / Clip   Station Marker C    Ruler Value 29    ReDS Value Range High volume overload    ReDS Actual Value 44          "

## 2024-06-09 NOTE — Patient Instructions (Signed)
 Medication Changes:  INCREASE TORSEMIDE  TO 40MG  TWICE DAILY   Lab Work:  Labs done today, your results will be available in MyChart, we will contact you for abnormal readings.  Referrals:  YOU HAVE BEEN REFERRED TO PARAMEDICINE THEY WILL REACH OUT TO YOU OR CALL TO ARRANGE THIS. PLEASE CALL US  WITH ANY CONCERNS   YOU HAVE BEEN REFERRED TO WOUND CARE CENTER FOR UNNA BOOTS THEY WILL REACH OUT TO YOU OR CALL TO ARRANGE THIS. PLEASE CALL US  WITH ANY CONCERNS   Follow-Up in: 1 WEEK AS SCHEDULED WITH APP CLINIC   At the [facility], you and your health needs are our priority. We have a designated team specialized in the treatment of Heart Failure. This Care Team includes your primary Heart Failure Specialized Cardiologist (physician), Advanced Practice Providers (APPs- Physician Assistants and Nurse Practitioners), and Pharmacist who all work together to provide you with the care you need, when you need it.   You may see any of the following providers on your designated Care Team at your next follow up:  Dr. Toribio Fuel Dr. Ezra Shuck Dr. Odis Brownie Greig Mosses, NP Robert Ramirez, Robert Ramirez [facility] [place] [facility], Robert Ramirez Beckey Coe, NP Jordan Lee, NP Tinnie Redman, PharmD   Please be sure to bring in all your medications bottles to every appointment.   Need to Contact Us :  If you have any questions or concerns before your next appointment please send us  a message through [place] or call our office at [phone].    TO LEAVE A MESSAGE FOR THE NURSE SELECT OPTION 2, PLEASE LEAVE A MESSAGE INCLUDING: YOUR NAME DATE OF BIRTH CALL BACK NUMBER REASON FOR CALL**this is important as we prioritize the call backs  YOU WILL RECEIVE A CALL BACK THE SAME DAY AS LONG AS YOU CALL BEFORE 4:00 PM

## 2024-06-10 NOTE — Discharge Summary (Signed)
 " Physician Discharge Summary   Patient: Robert Ramirez MRN: [id number] DOB: Jun 25, 1945  Admit date:     05/26/2024  Discharge date: 06/01/2023  Discharge Physician: Sigurd Pac   PCP: Jaycee Greig PARAS, NP   Recommendations at discharge:  [facility] clinic on 1/15 Outpatient palliative care  Discharge Diagnoses: Principal Problem:   Acute on chronic diastolic heart failure (HCC) End-stage RV failure,  Pulmonary hypertension COPD Chronic hypoxic respiratory failure   Elevated troponin   Hypertension associated with diabetes (HCC)   Diabetes (HCC)   Chronic obstructive asthma (with obstructive pulmonary disease) (HCC)   Coronary artery disease involving native coronary artery of native heart without angina pectoris   Chewing tobacco use   Acquired hypothyroidism   Acute kidney injury superimposed on chronic kidney disease   History of permanent cardiac pacemaker placement   (HFpEF) heart failure with preserved ejection fraction (HCC)   Mixed hyperlipidemia    Hospital Course:  Acute on chronic diastolic CHF End-stage RV failure, cor pulmonale - Admitted with volume overload,  - Last echo 12/07/2023, EF 60 to 65%, normal LV function, mild LVH, right RV overload, mildly increased in size, moderate elevated pulmonary artery hypertension - Right heart cath 7/25 concerning for severe pulmonary hypertension with advanced RV failure - Improving with diuresis, 8.3 L negative, appears close to euvolemic will switch to oral torsemide   - Continue Jardiance  and Aldactone  - Discussed overall prognosis and disease burden, agreeable to DNR, will send palliative care referral at discharge - Discharged home today, has follow-up in [facility] clinic on 1/15   Elevated troponin - Secondary to demand ischemia, from above, no ACS   History of permanent cardiac pacemaker placement Noted-functional   CKD 3 Stable   Acquired hypothyroidism Home dose Synthroid , check TSH   Chewing tobacco  use Counseled regarding tobacco use and abuse   CAD Continue aspirin , Lipitor, statins, Zetia    COPD -On O2 2 L at baseline Previous outpatient workup ruled out IDL, chronic obstructive asthma noted Continue as needed DuoNebs   Diabetes (HCC) On Jardiance , and Humulin -[employer] metformin  CBGs are stable   Mixed hyperlipidemia Continue statin  DISCHARGE MEDICATION: Allergies as of 05/31/2024   No Known Allergies      Medication List     STOP taking these medications    amLODipine  10 MG tablet Commonly known as: NORVASC           TAKE these medications    albuterol  108 (90 Base) MCG/ACT inhaler Commonly known as: VENTOLIN  HFA INHALE 2 PUFFS BY MOUTH EVERY 6 HOURS AS NEEDED FOR WHEEZING FOR SHORTNESS OF BREATH   aspirin  EC 81 MG tablet Take 81 mg by mouth daily. Swallow whole.   atorvastatin  40 MG tablet Commonly known as: LIPITOR Take 40 mg by mouth daily.   blood glucose meter kit and supplies Dispense based on patient and insurance preference. Use up to four times daily as directed. (FOR ICD-10 E10.9, E11.9).   Droplet Pen Needles 31G X 8 MM Misc Generic drug: Insulin  Pen Needle 1 each by Other route 2 (two) times daily with a meal.   ezetimibe  10 MG tablet Commonly known as: Zetia  Take 1 tablet (10 mg total) by mouth daily.   gentamicin  ointment 0.1 % Commonly known as: GARAMYCIN  Apply 1 Application topically daily. Apply to wound daily   HumuLIN  N KwikPen 100 UNIT/ML KwikPen Generic drug: Insulin  NPH (Human) (Isophane) Inject 33 Units into the skin in the morning and at bedtime.   levothyroxine   50 MCG tablet Commonly known as: SYNTHROID  Take 1 tablet (50 mcg total) by mouth daily.   metFORMIN  1000 MG tablet Commonly known as: GLUCOPHAGE  Take 1 tablet (1,000 mg total) by mouth 2 (two) times daily with a meal.   nitroGLYCERIN  0.4 MG SL tablet Commonly known as: NITROSTAT  Place 1 tablet (0.4 mg total) under the tongue every 5 (five)  minutes as needed for chest pain.   potassium chloride  SA 20 MEQ tablet Commonly known as: KLOR-CON  M Take 1 tablet (20 mEq total) by mouth daily.   spironolactone  25 MG tablet Commonly known as: ALDACTONE  Take 1 tablet (25 mg total) by mouth daily.   vitamin B-12 500 MCG tablet Commonly known as: CYANOCOBALAMIN  Take 500 mcg by mouth daily.   Torsemide  40 mg tabs Take 1 1 tab every morning               Discharge Care Instructions  (From admission, onward)           Start     Ordered   05/31/24 0000  Discharge wound care:       Comments: Routine   05/31/24 0818            Follow-up Information     [facility]) .   Specialty: Home Health Services Contact information: [address] [place]  [phone]        Jaycee Greig PARAS, NP Follow up.   Specialty: Nurse Practitioner Why: Please follow up in a week. Contact information: [address] [employer] [address] [place] [address] [phone]                Discharge Exam: Fredricka Weights   05/29/24 0351 05/30/24 0300 05/31/24 0356  Weight: 87.3 kg 85.8 kg 85 kg    The results of significant diagnostics from this hospitalization (including imaging, microbiology, ancillary and laboratory) are listed below for reference.   Imaging Studies: DG Chest 2 View Result Date: 05/26/2024 CLINICAL DATA:  Shortness of breath, swelling. EXAM: CHEST - 2 VIEW COMPARISON:  12/09/2023 FINDINGS: Dual lead left-sided pacemaker in place. The heart is enlarged but stable. Sequela of prior granulomatous disease with calcified hilar lymph nodes and scattered granuloma. Small to moderate size right pleural effusion with ill-defined opacity at the right lung base. Small left pleural effusion. No pneumothorax. No convincing pulmonary edema. There are calcified pleural plaques that were better assessed on prior CT. IMPRESSION: 1. Small to moderate size right pleural effusion with  ill-defined opacity at the right lung base, favor scarring when compared with prior CT. 2. Small left pleural effusion. 3. Stable cardiomegaly. No convincing pulmonary edema. Electronically Signed   By: Andrea Gasman M.D.   On: 05/26/2024 18:10    Microbiology: Results for orders placed or performed in visit on 02/04/24  WOUND CULTURE     Status: Abnormal   Collection Time: 02/04/24  4:52 PM  Result Value Ref Range Status   MICRO NUMBER: [id number]  Final   SPECIMEN QUALITY: Adequate  Final   SOURCE: WOUND  Final   STATUS: FINAL  Final   GRAM STAIN:   Final    No white blood cells seen Many epithelial cells Many Gram positive cocci in clusters Many Gram variable bacilli   ISOLATE 1: Streptococcus agalactiae (A)  Final    Comment: Heavy growth of Group B Streptococcus isolated Beta-hemolytic streptococci are predictably susceptible to Penicillin and other beta-lactams. Susceptibility testing not routinely performed. Please contact  the laboratory within 3 days if susceptibility  testing is desired.     Labs: CBC: No results for input(s): WBC, NEUTROABS, HGB, HCT, MCV, PLT in the last 168 hours. Basic Metabolic Panel: Recent Labs  Lab 06/09/24 1055  NA 144  K 4.7  CL 93*  CO2 >45*  GLUCOSE 167*  BUN 46*  CREATININE 1.32*  CALCIUM  9.4   Liver Function Tests: No results for input(s): AST, ALT, ALKPHOS, BILITOT, PROT, ALBUMIN in the last 168 hours. CBG: No results for input(s): GLUCAP in the last 168 hours.  Discharge time spent: greater than 30 minutes.  Signed: Sigurd Pac, MD [facility] 06/10/2024 "

## 2024-06-13 ENCOUNTER — Other Ambulatory Visit (HOSPITAL_COMMUNITY): Payer: Self-pay

## 2024-06-13 NOTE — Progress Notes (Signed)
 " Paramedicine Encounter    Patient ID: Robert Ramirez, male    DOB: 1946-05-01, 79 y.o.   MRN: [id number]   Complaints- chronic shortness of breath and dizziness, lower leg swelling with weeping  Assessment- CAOX4, male patient ambulatory met me at the door on nasal cannula (2LPM) slightly short of breath on ambulation. Weight is down from recent clinic weight- today he is 202.2lbs- lower legs swollen, red, weeping, has a referral for unna boots but have not been placed yet. Lungs diminished on auscultation, vitals as noted.   Compliance with meds- he has been taking his meds but has not been taking them correctly- we reviewed same today and corrected dosages and updated them.   Pill box filled- for one week   Refills needed- insulin  needles.   Meds changes since last visit- first visit today     Social changes- first visit today.    VISIT SUMMARY- Arrived for initial home paramedicine visit today with Robert Ramirez who reports to be feeling okay today. He does admit to chronic shortness of breath and dizziness. He does have lower leg edema with weeping- he is to be getting UNNA boots from [facility] RN. Today I obtained vitals and obtained assessment. Lungs sounds diminished- he says this is chronic for him. He is on 2LPM nasal cannula always. He says he uses his albuterol  inhaler as needed. We reviewed all meds and filled pill box for one week. He was taking some medications incorrectly- I educated him on same and he was understanding and agreeable to update. He has his insulin - he is taking Novolin  not Humulin  which is what's listed in his chart. He follows up with PCP this week and will address same. We did discuss his diet- he admitted to eating mostly carb centered foods, friend foods and using table salt sometimes. He is drinking around 72 ounces daily and this includes zero sugar gatorade, water  and coffee- I educated on use of gatorade and issues with sodium. He agreed and plans to not  be drinking any after the few he has left int he fridge are gone. We talked about this at length and I educated him on nutrition labels and sodium intake and fluid intake. He was agreeable and understanding. I educated him on weighing daily- I provided weight chart. He plans to weigh daily. I plan to follow up weekly on Mondays. Home visit complete.   BP 110/64   Pulse 74   Resp 18   SpO2 93% Comment: 2LPM Weight yesterday-- did not weigh  Last visit weight-- clinic visit 205lbs   SOCIAL/MEDICAL BARRIERS:  PHARMACY USED - [facility] on [address]    MED ISSUES:  AFFORDABILITY- None   PT ASSIST APPS NEEDED-None at present   PCP- [facility] Provider    INSURANCE- [employer] Medicare   SOURCE OF INCOME- did not discuss   TRANSPORTATION- son   FOOD INSECURITIES/NEEDS- none  FOOD STAMPS- none   REVIEWED DIET/FLUID/SALT RESTRICTIONS- yes   RENT/OWN HOME ISSUES- own   SOCIAL SUPPORT- sons/daughter in law   SAFETY/DOMESTIC ISSUES- none   SUBSTANCE ABUSE- none   DAILY WEIGHTS- not at present   EDUCATE ON DISEASE PROCESS/SYMPTOMS/PURPOSES OF MEDS- yes     ACTION: Home visit completed     Patient Care Team: Jaycee Greig PARAS, NP as PCP - General (Nurse Practitioner) Thukkani, Arun K, MD as PCP - Cardiology (Cardiology) Kennyth Chew, MD as PCP - Electrophysiology (Cardiology)  Patient Active Problem List   Diagnosis Date Noted  Acute on chronic diastolic heart failure (HCC) 05/27/2024   PAD (peripheral artery disease) 02/16/2024   Protein-calorie malnutrition, severe 12/14/2023   (HFpEF) heart failure with preserved ejection fraction (HCC) 12/07/2023   Elevated troponin 12/07/2023   Uncontrolled type 2 diabetes mellitus with hyperglycemia, with long-term current use of insulin  (HCC) 12/07/2023   Acute kidney injury superimposed on chronic kidney disease 12/07/2023   History of permanent cardiac pacemaker placement 12/07/2023   OSA treated with BiPAP 12/07/2023   AV block 06/12/2023    Complete heart block (HCC) 05/28/2023   Conductive hearing loss 04/05/2019   Chewing tobacco use 04/05/2019   Bilateral impacted cerumen 04/05/2019   Primary hyperparathyroidism 11/21/2018   Chronic respiratory failure with hypoxia and hypercapnia (HCC) 11/18/2018   Chronic obstructive asthma (with obstructive pulmonary disease) (HCC) 06/09/2018   Other secondary pulmonary hypertension (HCC) 06/09/2018   Serum total bilirubin elevated 03/26/2018   Asbestos exposure 03/26/2018   Lesion of skin of scalp 03/26/2018   Coronary artery disease involving native coronary artery of native heart without angina pectoris 03/26/2018   Acquired hypothyroidism 03/26/2018   Diabetes (HCC) 03/14/2018   Hyponatremia    Hypertension associated with diabetes (HCC) 07/18/2014   Mixed hyperlipidemia 07/18/2014   Diabetic retinopathy associated with type 2 diabetes mellitus (HCC) 07/18/2014   Current Medications[1] Allergies[2]   Social History   Socioeconomic History   Marital status: Married    Spouse name: Not on file   Number of children: Not on file   Years of education: Not on file   Highest education level: Not on file  Occupational History   Not on file  Tobacco Use   Smoking status: Former    Current packs/day: 0.00    Types: Cigarettes    Quit date: 03/30/1980    Years since quitting: 44.2    Passive exposure: Never   Smokeless tobacco: Current    Types: Chew, Snuff  Vaping Use   Vaping status: Never Used  Substance and Sexual Activity   Alcohol use: No   Drug use: No   Sexual activity: Not Currently  Other Topics Concern   Not on file  Social History Narrative   Not on file   Social Drivers of Health   Tobacco Use: High Risk (06/09/2024)   Patient History    Smoking Tobacco Use: Former    Smokeless Tobacco Use: Current    Passive Exposure: Never  Physicist, Medical Strain: Low Risk (08/06/2023)   Overall Financial Resource Strain (CARDIA)    Difficulty of Paying  Living Expenses: Not hard at all  Food Insecurity: No Food Insecurity (06/02/2024)   Epic    Worried About Radiation Protection Practitioner of Food in the Last Year: Never true    [employer] of Food in the Last Year: Never true  Transportation Needs: No Transportation Needs (06/02/2024)   Epic    Lack of Transportation (Medical): No    Lack of Transportation (Non-Medical): No  Physical Activity: Sufficiently Active (08/06/2023)   Exercise Vital Sign    Days of Exercise per Week: 7 days    Minutes of Exercise per Session: 30 min  Stress: No Stress Concern Present (08/06/2023)   [employer] of Occupational Health - Occupational Stress Questionnaire    Feeling of Stress : Not at all  Social Connections: Socially Isolated (05/30/2024)   Social Connection and Isolation Panel    Frequency of Communication with Friends and Family: More than three times a week    Frequency of Social Gatherings  with Friends and Family: More than three times a week    Attends Religious Services: Never    Active Member of Clubs or Organizations: No    Attends Banker Meetings: Never    Marital Status: Widowed  Intimate Partner Violence: Not At Risk (06/02/2024)   Epic    Fear of Current or Ex-Partner: No    Emotionally Abused: No    Physically Abused: No    Sexually Abused: No  Depression (PHQ2-9): Low Risk (06/02/2024)   Depression (PHQ2-9)    PHQ-2 Score: 0  Alcohol Screen: Low Risk (08/06/2023)   Alcohol Screen    Last Alcohol Screening Score (AUDIT): 0  Housing: Unknown (06/02/2024)   Epic    Unable to Pay for Housing in the Last Year: No    Number of Times Moved in the Last Year: Not on file    Homeless in the Last Year: No  Utilities: Not At Risk (06/02/2024)   Epic    Threatened with loss of utilities: No  Health Literacy: Adequate Health Literacy (08/06/2023)   B1300 Health Literacy    Frequency of need for help with medical instructions: Never    Physical Exam      Future Appointments  Date Time  Provider Department Center  06/14/2024  2:00 PM [facility]V  06/16/2024 10:00 AM Jaycee Greig PARAS, NP [facility] [address]  06/17/2024  2:30 PM [facility] PA/NP [facility] None  07/04/2024 10:40 AM Jaycee Greig PARAS, NP [facility] [address]  08/18/2024  3:00 PM PCE-ANNUAL WELLNESS VISIT [facility] [address]  09/05/2024  7:00 AM [facility] HVT DEVICE REMOTES [facility] H&V  12/05/2024  7:00 AM [facility] HVT DEVICE REMOTES [facility] H&V  03/06/2025  7:00 AM [facility] HVT DEVICE REMOTES [facility] H&V                      [1]  Current Outpatient Medications:    albuterol  (VENTOLIN  HFA) 108 (90 Base) MCG/ACT inhaler, INHALE 2 PUFFS BY MOUTH EVERY 6 HOURS AS NEEDED FOR WHEEZING FOR SHORTNESS OF BREATH, Disp: 9 g, Rfl: 5   aspirin  EC 81 MG tablet, Take 81 mg by mouth daily. Swallow whole., Disp: , Rfl:    atorvastatin  (LIPITOR) 40 MG tablet, Take 40 mg by mouth daily., Disp: , Rfl:    blood glucose meter kit and supplies, Dispense based on patient and insurance preference. Use up to four times daily as directed. (FOR ICD-10 E10.9, E11.9)., Disp: 1 each, Rfl: 0   Cholecalciferol  (VITAMIN D -3) 25 MCG (1000 UT) CAPS, Take 1 capsule by mouth every morning., Disp: , Rfl:    empagliflozin  (JARDIANCE ) 10 MG TABS tablet, Take 1 tablet (10 mg total) by mouth daily before breakfast., Disp: 90 tablet, Rfl: 3   ezetimibe  (ZETIA ) 10 MG tablet, Take 1 tablet (10 mg total) by mouth daily., Disp: 90 tablet, Rfl: 3   gentamicin  ointment (GARAMYCIN ) 0.1 %, Apply 1 Application topically daily. Apply to wound daily, Disp: 30 g, Rfl: 0   Insulin  NPH, Human,, Isophane, (HUMULIN  N KWIKPEN) 100 UNIT/ML Kiwkpen, Inject 33 Units into the skin in the morning and at bedtime., Disp: 60 mL, Rfl: 1   Insulin  Pen Needle (DROPLET PEN NEEDLES) 31G X 8 MM MISC, 1 each by Other route 2 (two) times daily with a meal., Disp: 200 each, Rfl: 0   levothyroxine  (SYNTHROID ) 50 MCG tablet, Take 1 tablet (50 mcg total) by mouth daily., Disp: 90 tablet,  Rfl: 0  metFORMIN  (GLUCOPHAGE ) 1000 MG tablet, Take 1 tablet (1,000 mg total) by mouth 2 (two) times daily with a meal., Disp: 180 tablet, Rfl: 0   nitroGLYCERIN  (NITROSTAT ) 0.4 MG SL tablet, Place 1 tablet (0.4 mg total) under the tongue every 5 (five) minutes as needed for chest pain., Disp: 25 tablet, Rfl: 3   potassium chloride  SA (KLOR-CON  M) 20 MEQ tablet, Take 1 tablet (20 mEq total) by mouth daily., Disp: 30 tablet, Rfl: 0   spironolactone  (ALDACTONE ) 25 MG tablet, Take 1 tablet (25 mg total) by mouth daily., Disp: 30 tablet, Rfl: 0   torsemide  (DEMADEX ) 20 MG tablet, Take 2 tablets (40 mg total) by mouth 2 (two) times daily., Disp: 60 tablet, Rfl: 5   vitamin B-12 (CYANOCOBALAMIN ) 500 MCG tablet, Take 500 mcg by mouth daily., Disp: , Rfl:  [2] No Known Allergies  "

## 2024-06-14 ENCOUNTER — Telehealth: Payer: Self-pay

## 2024-06-14 ENCOUNTER — Ambulatory Visit: Attending: Surgery | Admitting: Physician Assistant

## 2024-06-14 ENCOUNTER — Encounter: Payer: Self-pay | Admitting: Physician Assistant

## 2024-06-14 VITALS — BP 127/59 | Ht 73.0 in | Wt 205.0 lb

## 2024-06-14 DIAGNOSIS — I7025 Atherosclerosis of native arteries of other extremities with ulceration: Secondary | ICD-10-CM

## 2024-06-14 NOTE — Telephone Encounter (Signed)
 Left secure message for [facility], RN with [facility]  [phone])  Verbal Order given for weekly, bilateral UNNA Boot changes. Verbal Order for 3x/wk wound care to right foot ulcer.

## 2024-06-14 NOTE — Progress Notes (Unsigned)
 " Office Note     CC:  follow up Requesting Provider:  Jaycee Greig PARAS, NP  HPI: Robert Ramirez is a 79 y.o. (05-26-1946) male who presents for follow-up with wound check of right foot ulcer with PAD.  Patient was seen back in October by Dr. Gretta for right foot ulcer that had been present for 3 weeks. He is followed by Dr. Silva with podiatry.  He was offered an angiogram due to marginal toe pressure however patient elected for conservative management.  Today he reports he has been doing okay. He was just discharged from [facility] after COPD/CHF exacerbation. His legs have been very swollen with his volume overload since his admission. He explains that he had unna boots placed on his legs during his hospitalization, which were helping, but they were removed at the time he was discharged. His legs have been very swollen since. Reports recent weeping from the legs. He has been trying to elevate as best he can. He says he was discharged from hospital and was supposed to be getting 3x/week wound care with [facility]. He says they have only come one time per week. He has been trying to care for the wound by himself but he is unable to see it. Says his step son lives close by but has back trouble so unable to help him with his wound care. He denies any fever or chills. He is medically managed on Aspirin , Statin and Zetia .   Past Medical History:  Diagnosis Date   Diabetes mellitus    High cholesterol    Hypertension    Peripheral arterial disease     Past Surgical History:  Procedure Laterality Date   APPENDECTOMY     ARTERIAL LINE INSERTION N/A 12/09/2023   Procedure: ARTERIAL LINE INSERTION;  Surgeon: Cherrie Toribio SAUNDERS, MD;  Location: [facility] INVASIVE CV LAB;  Service: Cardiovascular;  Laterality: N/A;   CENTRAL LINE INSERTION  12/09/2023   Procedure: CENTRAL LINE INSERTION;  Surgeon: Cherrie Toribio SAUNDERS, MD;  Location: [facility] INVAS[facility] CV [facility];  Service: Cardiovascular;;   PACEMAKER  IMPLANT N/A 06/12/2023   Procedure: PACEMAKER IMPLANT;  Surgeon: Kennyth Chew, MD;  Location: [facility] INVASIVE CV LAB;  Service: Cardiovascular;  Laterality: N/A;   RIGHT HEART CATH N/A 12/09/2023   Procedure: RIGHT HEART CATH;  Surgeon: Cherrie Toribio SAUNDERS, MD;  Location: [facility]VAS[facility];  Service: Cardiovascular;  Laterality: N/A;   RIGHT/LEFT HEART CATH AND CORONARY ANGIOGRAPHY N/A 03/17/2018   Procedure: RIGHT/LEFT HEART CATH AND CORONARY ANGIOGRAPHY;  Surgeon: Claudene Pacific, MD;  Location: [facility] INVASIVE CV LAB;  Service: Cardiovascular;  Laterality: N/A;    Social History   Socioeconomic History   Marital status: Married    Spouse name: Not on file   Number of children: Not on file   Years of education: Not on file   Highest education level: Not on file  Occupational History   Not on file  Tobacco Use   Smoking status: Former    Current packs/day: 0.00    Types: Cigarettes    Quit date: 03/30/1980    Years since quitting: 44.2    Passive exposure: Never   Smokeless tobacco: Current    Types: Chew, Snuff  Vaping Use   Vaping status: Never Used  Substance and Sexual Activity   Alcohol use: No   Drug use: No   Sexual activity: Not Currently  Other Topics Concern   Not on file  Social History Narrative   Not  on file   Social Drivers of Health   Tobacco Use: High Risk (06/14/2024)   Patient History    Smoking Tobacco Use: Former    Smokeless Tobacco Use: Current    Passive Exposure: Never  Physicist, Medical Strain: Low Risk (08/06/2023)   Overall Financial Resource Strain (CARDIA)    Difficulty of Paying Living Expenses: Not hard at all  Food Insecurity: No Food Insecurity (06/02/2024)   Epic    Worried About Programme Researcher, Broadcasting/film/video in the Last Year: Never true    Ran Out of Food in the Last Year: Never true  Transportation Needs: No Transportation Needs (06/02/2024)   Epic    Lack of Transportation (Medical): No    Lack of Transportation (Non-Medical): No  Physical  Activity: Sufficiently Active (08/06/2023)   Exercise Vital Sign    Days of Exercise per Week: 7 days    Minutes of Exercise per Session: 30 min  Stress: No Stress Concern Present (08/06/2023)   [employer] of Occupational Health - Occupational Stress Questionnaire    Feeling of Stress : Not at all  Social Connections: Socially Isolated (05/30/2024)   Social Connection and Isolation Panel    Frequency of Communication with Friends and Family: More than three times a week    Frequency of Social Gatherings with Friends and Family: More than three times a week    Attends Religious Services: Never    Database Administrator or Organizations: No    Attends Banker Meetings: Never    Marital Status: Widowed  Intimate Partner Violence: Not At Risk (06/02/2024)   Epic    Fear of Current or Ex-Partner: No    Emotionally Abused: No    Physically Abused: No    Sexually Abused: No  Depression (PHQ2-9): Low Risk (06/02/2024)   Depression (PHQ2-9)    PHQ-2 Score: 0  Alcohol Screen: Low Risk (08/06/2023)   Alcohol Screen    Last Alcohol Screening Score (AUDIT): 0  Housing: Unknown (06/02/2024)   Epic    Unable to Pay for Housing in the Last Year: No    Number of Times Moved in the Last Year: Not on file    Homeless in the Last Year: No  Utilities: Not At Risk (06/02/2024)   Epic    Threatened with loss of utilities: No  Health Literacy: Adequate Health Literacy (08/06/2023)   B1300 Health Literacy    Frequency of need for help with medical instructions: Never    Family History  Problem Relation Age of Onset   Heart failure Mother    Cancer Father    Diabetes Sister    Cancer Sister     Current Outpatient Medications  Medication Sig Dispense Refill   albuterol  (VENTOLIN  HFA) 108 (90 Base) MCG/ACT inhaler INHALE 2 PUFFS BY MOUTH EVERY 6 HOURS AS NEEDED FOR WHEEZING FOR SHORTNESS OF BREATH 9 g 5   aspirin  EC 81 MG tablet Take 81 mg by mouth daily. Swallow whole.     atorvastatin   (LIPITOR) 40 MG tablet Take 40 mg by mouth daily.     blood glucose meter kit and supplies Dispense based on patient and insurance preference. Use up to four times daily as directed. (FOR ICD-10 E10.9, E11.9). 1 each 0   Cholecalciferol  (VITAMIN D -3) 25 MCG (1000 UT) CAPS Take 1 capsule by mouth every morning.     empagliflozin  (JARDIANCE ) 10 MG TABS tablet Take 1 tablet (10 mg total) by mouth daily before breakfast. 90 tablet  3   ezetimibe  (ZETIA ) 10 MG tablet Take 1 tablet (10 mg total) by mouth daily. (Patient taking differently: Take 10 mg by mouth daily.) 90 tablet 3   gentamicin  ointment (GARAMYCIN ) 0.1 % Apply 1 Application topically daily. Apply to wound daily 30 g 0   Insulin  NPH, Human,, Isophane, (HUMULIN  N KWIKPEN) 100 UNIT/ML Kiwkpen Inject 33 Units into the skin in the morning and at bedtime. (Patient taking differently: Inject 33 Units into the skin in the morning and at bedtime. Taking Novolin ) 60 mL 1   Insulin  Pen Needle (DROPLET PEN NEEDLES) 31G X 8 MM MISC 1 each by Other route 2 (two) times daily with a meal. 200 each 0   levothyroxine  (SYNTHROID ) 50 MCG tablet Take 1 tablet (50 mcg total) by mouth daily. 90 tablet 0   metFORMIN  (GLUCOPHAGE ) 1000 MG tablet Take 1 tablet (1,000 mg total) by mouth 2 (two) times daily with a meal. 180 tablet 0   nitroGLYCERIN  (NITROSTAT ) 0.4 MG SL tablet Place 1 tablet (0.4 mg total) under the tongue every 5 (five) minutes as needed for chest pain. 25 tablet 3   potassium chloride  SA (KLOR-CON  M) 20 MEQ tablet Take 1 tablet (20 mEq total) by mouth daily. 30 tablet 0   spironolactone  (ALDACTONE ) 25 MG tablet Take 1 tablet (25 mg total) by mouth daily. 30 tablet 0   torsemide  (DEMADEX ) 20 MG tablet Take 2 tablets (40 mg total) by mouth 2 (two) times daily. 60 tablet 5   vitamin B-12 (CYANOCOBALAMIN ) 500 MCG tablet Take 500 mcg by mouth daily.     No current facility-administered medications for this visit.    Allergies[1]   REVIEW OF SYSTEMS:   Negative unless noted in HPI [X]  denotes positive finding, [ ]  denotes negative finding Cardiac  Comments:  Chest pain or chest pressure:    Shortness of breath upon exertion:    Short of breath when lying flat:    Irregular heart rhythm:        Vascular    Pain in calf, thigh, or hip brought on by ambulation:    Pain in feet at night that wakes you up from your sleep:     Blood clot in your veins:    Leg swelling:         Pulmonary    Oxygen  at home:    Productive cough:     Wheezing:         Neurologic    Sudden weakness in arms or legs:     Sudden numbness in arms or legs:     Sudden onset of difficulty speaking or slurred speech:    Temporary loss of vision in one eye:     Problems with dizziness:         Gastrointestinal    Blood in stool:     Vomited blood:         Genitourinary    Burning when urinating:     Blood in urine:        Psychiatric    Major depression:         Hematologic    Bleeding problems:    Problems with blood clotting too easily:        Skin    Rashes or ulcers:        Constitutional    Fever or chills:      PHYSICAL EXAMINATION:  Vitals:   06/14/24 1323  BP: (!) 127/59  Weight: 205 lb (93 kg)  Height: 6' 1 (1.854 m)    General:  WDWN in NAD; vital signs documented above Gait: Not observed, in wheel chair HENT: WNL, normocephalic Pulmonary: normal non-labored breathing Cardiac: regular HR Abdomen: soft, Vascular Exam/Pulses:  2+ femoral, Doppler DP/PT/Pero signals. Bilateral lower extremities very edematous, lipodermatosclerosis, some small blistering and weeping right greater than left Extremities: with ischemic changes, without Gangrene , without cellulitis; with open wound of right lateral foot as shown below  Musculoskeletal: no muscle wasting or atrophy  Neurologic: A&O X 3 Psychiatric:  The pt has Normal affect.   ASSESSMENT/PLAN:: 79 y.o. male here for follow up wound check of right foot ulcer with PAD.  Patient  was seen back in October by Dr. Gretta for right foot ulcer that had been present for 3 weeks. He is followed by Dr. Silva with podiatry.  He was offered an angiogram due to marginal toe pressure however patient elected for conservative management. Patient continues to have right foot wound. This is essentially unchanged. He now has a lot of lower extremity edema as well with weeping wounds. Recommend bilateral unna boots. Will contact his [facility] RN with [facility] to do weekly unna boot changes. We left the right foot wound exposed so that it can be dressed daily. Advised patient to wash would daily with wound cleaner and apply dressings. Will give [facility] RN with [facility] instructions for hydrogel or santyl dressing changes daily. Encourage him to continue elevating his legs daily. He is still wanting to pursue conservative management at this time especially with his other health issues at this time. Will have him follow up in 1 month for wound check.    Teretha Damme, PA-C [facility] [phone]  Clinic MD:   Sheree      [1] No Known Allergies  "

## 2024-06-15 ENCOUNTER — Encounter: Payer: Self-pay | Admitting: Physician Assistant

## 2024-06-16 ENCOUNTER — Ambulatory Visit: Payer: Self-pay | Admitting: Family

## 2024-06-16 ENCOUNTER — Other Ambulatory Visit: Payer: Self-pay | Admitting: Family

## 2024-06-16 ENCOUNTER — Other Ambulatory Visit (HOSPITAL_COMMUNITY): Payer: Self-pay | Admitting: Cardiology

## 2024-06-16 ENCOUNTER — Other Ambulatory Visit (HOSPITAL_COMMUNITY): Payer: Self-pay

## 2024-06-16 VITALS — BP 113/72 | HR 84 | Ht 73.0 in | Wt 201.0 lb

## 2024-06-16 DIAGNOSIS — I5033 Acute on chronic diastolic (congestive) heart failure: Secondary | ICD-10-CM | POA: Diagnosis not present

## 2024-06-16 DIAGNOSIS — I2510 Atherosclerotic heart disease of native coronary artery without angina pectoris: Secondary | ICD-10-CM | POA: Diagnosis not present

## 2024-06-16 DIAGNOSIS — R7989 Other specified abnormal findings of blood chemistry: Secondary | ICD-10-CM

## 2024-06-16 DIAGNOSIS — E039 Hypothyroidism, unspecified: Secondary | ICD-10-CM

## 2024-06-16 DIAGNOSIS — Z720 Tobacco use: Secondary | ICD-10-CM | POA: Diagnosis not present

## 2024-06-16 DIAGNOSIS — J449 Chronic obstructive pulmonary disease, unspecified: Secondary | ICD-10-CM

## 2024-06-16 DIAGNOSIS — E1159 Type 2 diabetes mellitus with other circulatory complications: Secondary | ICD-10-CM

## 2024-06-16 DIAGNOSIS — N189 Chronic kidney disease, unspecified: Secondary | ICD-10-CM | POA: Diagnosis not present

## 2024-06-16 DIAGNOSIS — E782 Mixed hyperlipidemia: Secondary | ICD-10-CM

## 2024-06-16 DIAGNOSIS — Z950 Presence of cardiac pacemaker: Secondary | ICD-10-CM | POA: Diagnosis not present

## 2024-06-16 DIAGNOSIS — I50810 Right heart failure, unspecified: Secondary | ICD-10-CM

## 2024-06-16 DIAGNOSIS — J4489 Other specified chronic obstructive pulmonary disease: Secondary | ICD-10-CM

## 2024-06-16 DIAGNOSIS — N179 Acute kidney failure, unspecified: Secondary | ICD-10-CM | POA: Diagnosis not present

## 2024-06-16 DIAGNOSIS — I5030 Unspecified diastolic (congestive) heart failure: Secondary | ICD-10-CM | POA: Diagnosis not present

## 2024-06-16 DIAGNOSIS — I10 Essential (primary) hypertension: Secondary | ICD-10-CM | POA: Diagnosis not present

## 2024-06-16 DIAGNOSIS — Z09 Encounter for follow-up examination after completed treatment for conditions other than malignant neoplasm: Secondary | ICD-10-CM

## 2024-06-16 DIAGNOSIS — I152 Hypertension secondary to endocrine disorders: Secondary | ICD-10-CM

## 2024-06-16 DIAGNOSIS — I272 Pulmonary hypertension, unspecified: Secondary | ICD-10-CM

## 2024-06-16 DIAGNOSIS — J9611 Chronic respiratory failure with hypoxia: Secondary | ICD-10-CM

## 2024-06-16 DIAGNOSIS — E119 Type 2 diabetes mellitus without complications: Secondary | ICD-10-CM

## 2024-06-16 DIAGNOSIS — E1165 Type 2 diabetes mellitus with hyperglycemia: Secondary | ICD-10-CM

## 2024-06-16 NOTE — Progress Notes (Signed)
 Paramedicine Encounter    Patient ID: Robert Ramirez, male    DOB: Dec 15, 1945, 79 y.o.   MRN: [id number]   Med Rec completed with impending winter weather- second pill box filled.   Powell Mirza, EMT-Paramedic [phone] 06/16/2024    ACTION: Home visit completed

## 2024-06-16 NOTE — Progress Notes (Addendum)
 "   Patient ID: Robert Ramirez, male    DOB: [birth year]  MRN: [id number]  CC: Hospital Discharge Follow-Up  Subjective: Robert Ramirez is a 79 y.o. male who presents for hospital discharge follow-up. He is accompanied by his son.  His concerns today include:  Patient seen on 05/26/2024 - 05/31/2024 (5 days) at [facility] for acute on chronic diastolic heart failure and additional diagnoses. Today patient reports feeling improved and denies red flag symptoms. Patient established with Cardiology and had recent office visit. Patient established with Pulmonology and Nephrology. Patient doing well on medication regimen with no issues/concerns. Patient due for diabetic eye exam and declines.   Patient Active Problem List   Diagnosis Date Noted   Acute on chronic diastolic heart failure (HCC) 05/27/2024   PAD (peripheral artery disease) 02/16/2024   Protein-calorie malnutrition, severe 12/14/2023   (HFpEF) heart failure with preserved ejection fraction (HCC) 12/07/2023   Elevated troponin 12/07/2023   Uncontrolled type 2 diabetes mellitus with hyperglycemia, with long-term current use of insulin  (HCC) 12/07/2023   Acute kidney injury superimposed on chronic kidney disease 12/07/2023   History of permanent cardiac pacemaker placement 12/07/2023   OSA treated with BiPAP 12/07/2023   AV block 06/12/2023   Complete heart block (HCC) 05/28/2023   Conductive hearing loss 04/05/2019   Chewing tobacco use 04/05/2019   Bilateral impacted cerumen 04/05/2019   Primary hyperparathyroidism 11/21/2018   Chronic respiratory failure with hypoxia and hypercapnia (HCC) 11/18/2018   Chronic obstructive asthma (with obstructive pulmonary disease) (HCC) 06/09/2018   Other secondary pulmonary hypertension (HCC) 06/09/2018   Serum total bilirubin elevated 03/26/2018   Asbestos exposure 03/26/2018   Lesion of skin of scalp 03/26/2018   Coronary artery disease involving native coronary artery of  native heart without angina pectoris 03/26/2018   Acquired hypothyroidism 03/26/2018   Diabetes (HCC) 03/14/2018   Hyponatremia    Hypertension associated with diabetes (HCC) 07/18/2014   Mixed hyperlipidemia 07/18/2014   Diabetic retinopathy associated with type 2 diabetes mellitus (HCC) 07/18/2014     Medications Ordered Prior to Encounter[1]  Allergies[2]  Social History   Socioeconomic History   Marital status: Married    Spouse name: Not on file   Number of children: Not on file   Years of education: Not on file   Highest education level: Not on file  Occupational History   Not on file  Tobacco Use   Smoking status: Former    Current packs/day: 0.00    Types: Cigarettes    Quit date: 03/30/1980    Years since quitting: 44.2    Passive exposure: Never   Smokeless tobacco: Current    Types: Chew, Snuff  Vaping Use   Vaping status: Never Used  Substance and Sexual Activity   Alcohol use: No   Drug use: No   Sexual activity: Not Currently  Other Topics Concern   Not on file  Social History Narrative   Not on file   Social Drivers of Health   Tobacco Use: High Risk (06/15/2024)   Patient History    Smoking Tobacco Use: Former    Smokeless Tobacco Use: Current    Passive Exposure: Never  Physicist, Medical Strain: Low Risk (08/06/2023)   Overall Financial Resource Strain (CARDIA)    Difficulty of Paying Living Expenses: Not hard at all  Food Insecurity: No Food Insecurity (06/02/2024)   Epic    Worried About Radiation Protection Practitioner of Food in the Last Year: Never true    Ran  Out of Food in the Last Year: Never true  Transportation Needs: No Transportation Needs (06/02/2024)   Epic    Lack of Transportation (Medical): No    Lack of Transportation (Non-Medical): No  Physical Activity: Sufficiently Active (08/06/2023)   Exercise Vital Sign    Days of Exercise per Week: 7 days    Minutes of Exercise per Session: 30 min  Stress: No Stress Concern Present (08/06/2023)   [employer] of Occupational Health - Occupational Stress Questionnaire    Feeling of Stress : Not at all  Social Connections: Socially Isolated (05/30/2024)   Social Connection and Isolation Panel    Frequency of Communication with Friends and Family: More than three times a week    Frequency of Social Gatherings with Friends and Family: More than three times a week    Attends Religious Services: Never    Database Administrator or Organizations: No    Attends Banker Meetings: Never    Marital Status: Widowed  Intimate Partner Violence: Not At Risk (06/02/2024)   Epic    Fear of Current or Ex-Partner: No    Emotionally Abused: No    Physically Abused: No    Sexually Abused: No  Depression (PHQ2-9): Low Risk (06/02/2024)   Depression (PHQ2-9)    PHQ-2 Score: 0  Alcohol Screen: Low Risk (08/06/2023)   Alcohol Screen    Last Alcohol Screening Score (AUDIT): 0  Housing: Unknown (06/02/2024)   Epic    Unable to Pay for Housing in the Last Year: No    Number of Times Moved in the Last Year: Not on file    Homeless in the Last Year: No  Utilities: Not At Risk (06/02/2024)   Epic    Threatened with loss of utilities: No  Health Literacy: Adequate Health Literacy (08/06/2023)   B1300 Health Literacy    Frequency of need for help with medical instructions: Never    Family History  Problem Relation Age of Onset   Heart failure Mother    Cancer Father    Diabetes Sister    Cancer Sister     Past Surgical History:  Procedure Laterality Date   APPENDECTOMY     ARTERIAL LINE INSERTION N/A 12/09/2023   Procedure: ARTERIAL LINE INSERTION;  Surgeon: Cherrie Toribio SAUNDERS, MD;  Location: [facility];  Service: Cardiovascular;  Laterality: N/A;   CENTRAL LINE INSERTION  12/09/2023   Procedure: CENTRAL LINE INSERTION;  Surgeon: Cherrie Toribio SAUNDERS, MD;  Location: [facility];  Service: Cardiovascular;;   PACEMAKER IMPLANT N/A 06/12/2023   Procedure: PACEMAKER IMPLANT;  Surgeon:  Kennyth Chew, MD;  Location: [facility] INVASIVE CV LAB;  Service: Cardiovascular;  Laterality: N/A;   RIGHT HEART CATH N/A 12/09/2023   Procedure: RIGHT HEART CATH;  Surgeon: Cherrie Toribio SAUNDERS, MD;  Location: [facility];  Service: Cardiovascular;  Laterality: N/A;   RIGHT/LEFT HEART CATH AND CORONARY ANGIOGRAPHY N/A 03/17/2018   Procedure: RIGHT/LEFT HEART CATH AND CORONARY ANGIOGRAPHY;  Surgeon: Claudene Pacific, MD;  Location: [facility]AS[facility];  Service: Cardiovascular;  Laterality: N/A;    ROS: Review of Systems Negative except as stated above  PHYSICAL EXAM: BP 113/72   Pulse 84   Ht 6' 1 (1.854 m)   Wt 201 lb (91.2 kg)   SpO2 98%   BMI 26.52 kg/m   Physical Exam HENT:     Head: Normocephalic and atraumatic.     Nose: Nose normal.  Mouth/Throat:     Mouth: Mucous membranes are moist.     Pharynx: Oropharynx is clear.  Eyes:     Extraocular Movements: Extraocular movements intact.     Conjunctiva/sclera: Conjunctivae normal.     Pupils: Pupils are equal, round, and reactive to light.  Cardiovascular:     Rate and Rhythm: Normal rate and regular rhythm.     Pulses: Normal pulses.     Heart sounds: Normal heart sounds.  Pulmonary:     Effort: Pulmonary effort is normal.     Breath sounds: Normal breath sounds.  Musculoskeletal:        General: Normal range of motion.     Cervical back: Normal range of motion and neck supple.  Neurological:     General: No focal deficit present.     Mental Status: He is alert and oriented to person, place, and time.  Psychiatric:        Mood and Affect: Mood normal.        Behavior: Behavior normal.     ASSESSMENT AND PLAN: 1. Hospital discharge follow-up (Primary) - Reviewed hospital course, current medications, ensured proper follow-up in place, and addressed concerns.   2. Acute on chronic diastolic heart failure (HCC) 3. RVF (right ventricular failure) (HCC) 4. Heart failure with preserved ejection fraction (HFpEF),  unspecified HF chronicity (HCC) 5. Coronary artery disease involving native coronary artery of native heart without angina pectoris 6. History of permanent cardiac pacemaker placement 7. Elevated troponin 8. Mixed hyperlipidemia - Continue present management as managed per established Cardiology.  - Keep all scheduled appointments with established Cardiology.   13. Type 2 diabetes mellitus with hyperglycemia, unspecified whether long term insulin  use (HCC) 14. Hypertension associated with diabetes (HCC) - Hemoglobin A1c result pending. - Continue present management.  - Discussed the importance of healthy eating habits, low-carbohydrate diet, low-sugar diet, regular aerobic exercise (at least 150 minutes a week as tolerated) and medication compliance to achieve or maintain control of diabetes. Counseled on medication adherence/adverse effects.  - Follow-up with primary provider as scheduled.  - Hemoglobin A1c  15. Diabetic eye exam (HCC) - Patient declined.   16. Acute kidney injury superimposed on chronic kidney disease - Keep all scheduled appointments with established Nephrology.   17. Acquired hypothyroidism - Continue present management.  - Routine screening.  - Follow-up with primary provider as scheduled.  - TSH  18. Chewing tobacco use - Counseled to quit.    Patient was given the opportunity to ask questions.  Patient verbalized understanding of the plan and was able to repeat key elements of the plan. Patient was given clear instructions to go to Emergency Department or return to medical center if symptoms don't improve, worsen, or new problems develop.The patient verbalized understanding.   Orders Placed This Encounter  Procedures   Hemoglobin A1c   TSH    Return for Follow-up as needed.  Greig JINNY Chute, NP      [1]  Current Outpatient Medications on File Prior to Visit  Medication Sig Dispense Refill   albuterol  (VENTOLIN  HFA) 108 (90 Base) MCG/ACT inhaler  INHALE 2 PUFFS BY MOUTH EVERY 6 HOURS AS NEEDED FOR WHEEZING FOR SHORTNESS OF BREATH 9 g 5   aspirin  EC 81 MG tablet Take 81 mg by mouth daily. Swallow whole.     atorvastatin  (LIPITOR) 40 MG tablet Take 40 mg by mouth daily.     blood glucose meter kit and supplies Dispense based on patient and insurance preference.  Use up to four times daily as directed. (FOR ICD-10 E10.9, E11.9). 1 each 0   Cholecalciferol  (VITAMIN D -3) 25 MCG (1000 UT) CAPS Take 1 capsule by mouth every morning.     empagliflozin  (JARDIANCE ) 10 MG TABS tablet Take 1 tablet (10 mg total) by mouth daily before breakfast. 90 tablet 3   ezetimibe  (ZETIA ) 10 MG tablet Take 1 tablet (10 mg total) by mouth daily. (Patient taking differently: Take 10 mg by mouth daily.) 90 tablet 3   gentamicin  ointment (GARAMYCIN ) 0.1 % Apply 1 Application topically daily. Apply to wound daily 30 g 0   Insulin  NPH, Human,, Isophane, (HUMULIN  N KWIKPEN) 100 UNIT/ML Kiwkpen Inject 33 Units into the skin in the morning and at bedtime. (Patient taking differently: Inject 33 Units into the skin in the morning and at bedtime. Taking Novolin ) 60 mL 1   Insulin  Pen Needle (DROPLET PEN NEEDLES) 31G X 8 MM MISC 1 each by Other route 2 (two) times daily with a meal. 200 each 0   levothyroxine  (SYNTHROID ) 50 MCG tablet Take 1 tablet (50 mcg total) by mouth daily. 90 tablet 0   metFORMIN  (GLUCOPHAGE ) 1000 MG tablet Take 1 tablet (1,000 mg total) by mouth 2 (two) times daily with a meal. 180 tablet 0   nitroGLYCERIN  (NITROSTAT ) 0.4 MG SL tablet Place 1 tablet (0.4 mg total) under the tongue every 5 (five) minutes as needed for chest pain. 25 tablet 3   potassium chloride  SA (KLOR-CON  M) 20 MEQ tablet Take 1 tablet (20 mEq total) by mouth daily. 30 tablet 0   spironolactone  (ALDACTONE ) 25 MG tablet Take 1 tablet (25 mg total) by mouth daily. 30 tablet 0   torsemide  (DEMADEX ) 20 MG tablet Take 2 tablets (40 mg total) by mouth 2 (two) times daily. 60 tablet 5   vitamin  B-12 (CYANOCOBALAMIN ) 500 MCG tablet Take 500 mcg by mouth daily.     No current facility-administered medications on file prior to visit.  [2] No Known Allergies  "

## 2024-06-16 NOTE — Telephone Encounter (Signed)
 Requested Prescriptions  Pending Prescriptions Disp Refills   metFORMIN  (GLUCOPHAGE ) 1000 MG tablet [Pharmacy Med Name: metFORMIN  HCl 1000 MG Oral Tablet] 180 tablet 0    Sig: TAKE 1 TABLET BY MOUTH TWICE DAILY WITH MEALS     Endocrinology:  Diabetes - Biguanides Failed - 06/16/2024 12:49 PM      Failed - Cr in normal range and within 360 days    Creatinine, Ser  Date Value Ref Range Status  06/09/2024 1.32 (H) 0.61 - 1.24 mg/dL Final   Creatinine, POC  Date Value Ref Range Status  02/18/2024 23.7 mg/dL Final    Comment:    ABS BY HIM         Failed - HBA1C is between 0 and 7.9 and within 180 days    HbA1c, POC (controlled diabetic range)  Date Value Ref Range Status  03/15/2021 8.3 (A) 0.0 - 7.0 % Final   Hgb A1c MFr Bld  Date Value Ref Range Status  12/25/2023 8.9 (H) 4.8 - 5.6 % Final    Comment:             Prediabetes: 5.7 - 6.4          Diabetes: >6.4          Glycemic control for adults with diabetes: <7.0          Failed - eGFR in normal range and within 360 days    GFR calc Af Amer  Date Value Ref Range Status  03/18/2018 >60 >60 mL/min Final    Comment:    (NOTE) The eGFR has been calculated using the CKD EPI equation. This calculation has not been validated in all clinical situations. eGFR's persistently <60 mL/min signify possible Chronic Kidney Disease.    GFR, Estimated  Date Value Ref Range Status  06/09/2024 56 (L) >60 mL/min Final    Comment:    (NOTE) Calculated using the CKD-EPI Creatinine Equation (2021)    eGFR  Date Value Ref Range Status  12/25/2023 37 (L) >59 mL/min/1.73 Final   EGFR  Date Value Ref Range Status  02/18/2024 62.0  Final         Failed - B12 Level in normal range and within 720 days    No results found for: VITAMINB12       Passed - Valid encounter within last 6 months    Recent Outpatient Visits           Today Hospital discharge follow-up   [facility], [place], NP   5  months ago Hospital discharge follow-up   [facility] at [facility], Amy J, NP   8 months ago Type 2 diabetes mellitus with hyperglycemia, with long-term current use of insulin  [facility])   [place] [facility] at [facility], [place], NP   11 months ago Annual physical exam   [facility] at [facility], [place], NP   1 year ago Medicare annual wellness visit, subsequent   [place] [facility], Greig PARAS, NP       Future Appointments             In 2 weeks Rosan, Harlene Fickle, DO [facility] [place], [facility]            Passed - CBC within normal limits and completed in the last 12 months  WBC  Date Value Ref Range Status  05/27/2024 8.1 4.0 - 10.5 K/uL Final   RBC  Date Value Ref Range Status  05/27/2024 4.27 4.22 - 5.81 MIL/uL Final   Hemoglobin  Date Value Ref Range Status  05/27/2024 12.5 (L) 13.0 - 17.0 g/dL Final  98/85/7974 83.9 13.0 - 17.7 g/dL Final   Total hemoglobin  Date Value Ref Range Status  12/10/2023 14.5 12.0 - 16.0 g/dL Final   HCT  Date Value Ref Range Status  05/27/2024 38.9 (L) 39.0 - 52.0 % Final   Hematocrit  Date Value Ref Range Status  06/09/2023 49.6 37.5 - 51.0 % Final   MCHC  Date Value Ref Range Status  05/27/2024 32.1 30.0 - 36.0 g/dL Final   [facility]  Date Value Ref Range Status  05/27/2024 29.3 26.0 - 34.0 pg Final   MCV  Date Value Ref Range Status  05/27/2024 91.1 80.0 - 100.0 fL Final  06/09/2023 92 79 - 97 fL Final   No results found for: PLTCOUNTKUC, LABPLAT, POCPLA RDW  Date Value Ref Range Status  05/27/2024 13.3 11.5 - 15.5 % Final  06/09/2023 12.2 11.6 - 15.4 % Final

## 2024-06-17 ENCOUNTER — Ambulatory Visit (HOSPITAL_COMMUNITY)

## 2024-06-17 ENCOUNTER — Ambulatory Visit: Payer: Self-pay | Admitting: Family

## 2024-06-17 LAB — TSH: TSH: 4.44 u[IU]/mL (ref 0.450–4.500)

## 2024-06-17 LAB — HEMOGLOBIN A1C
Est. average glucose Bld gHb Est-mCnc: 151 mg/dL
Hgb A1c MFr Bld: 6.9 % — ABNORMAL HIGH (ref 4.8–5.6)

## 2024-06-17 NOTE — Progress Notes (Incomplete)
 "  Advanced Heart Failure Clinic Note   PCP: Jaycee Greig PARAS, NP Cardiologist: Lurena MARLA Red, MD  HF Cardiologist: Dr. Cherrie  HPI: 79 y.o. male with history of CAD, chronic HFpEF with RV failure, DM, HTN, HLD, OSA and chronic respiratory failure on BiPAP, CHB s/p dual chamber PPM with LBBAP lead in 01/25, hx asbestos exposure with pleural plaque on prior HRCT but no ILD, remote tobacco use.    Appears to be evidence of RV dysfunction on echo dating back to 2019. L/RHC in 02/2018: Nonobstructive CAD involving RCA, PCWP mean 8, PA 46/16 (26), Fick CO/CI 4.45/2.07, TD CO/CI 3.64/1.69.    Admitted for acute decompensated HFpEF and RV failure 12/07/23. Echo showed EF 60-65%, interventricular septum is flattened, RV enlarged and HK, RVSP 58 mmHg, moderate TR. RHC showed RA 16, mPA 34, PCWP 11, TD CO/CI 3.8/1.8, PVR 6.0. Required ICU stay for BiPAP and inotrope/pressor support.   Seen by PCP 12/25/23. Noted to by hypotensive 74/38, asymptomatic. BUN >100. Referred to nutrition, ENT for dysphagia, and nephrology.   Had debridement on 04/07/24. Did not want any further surgeries. Saw VVS and was offered lower extremity angiogram, however patient declined invasive treatments. Followed by palliative care.   Admitted 1/26 with A/C HFpEF. Diuresed well with IV lasix . Code status changed to DNR. Discharged with palliative care referral for OP f/u.   At hospital f/u visit last wk, he was volume overloaded on exam and by ReDs. Torsemide  was increased to 40 mg bid.  He returns today for f/u.   Wt Readings from Last 3 Encounters:  06/16/24 91.2 kg (201 lb)  06/14/24 93 kg (205 lb)  06/09/24 93 kg (205 lb)      Past Medical History:  Diagnosis Date   Diabetes mellitus    High cholesterol    Hypertension    Peripheral arterial disease    Current Outpatient Medications  Medication Sig Dispense Refill   albuterol  (VENTOLIN  HFA) 108 (90 Base) MCG/ACT inhaler INHALE 2 PUFFS BY MOUTH EVERY 6 HOURS AS  NEEDED FOR WHEEZING FOR SHORTNESS OF BREATH 9 g 5   aspirin  EC 81 MG tablet Take 81 mg by mouth daily. Swallow whole.     atorvastatin  (LIPITOR) 40 MG tablet Take 40 mg by mouth daily.     blood glucose meter kit and supplies Dispense based on patient and insurance preference. Use up to four times daily as directed. (FOR ICD-10 E10.9, E11.9). 1 each 0   Cholecalciferol  (VITAMIN D -3) 25 MCG (1000 UT) CAPS Take 1 capsule by mouth every morning.     empagliflozin  (JARDIANCE ) 10 MG TABS tablet Take 1 tablet (10 mg total) by mouth daily before breakfast. 90 tablet 3   ezetimibe  (ZETIA ) 10 MG tablet Take 1 tablet (10 mg total) by mouth daily. (Patient taking differently: Take 10 mg by mouth daily.) 90 tablet 3   gentamicin  ointment (GARAMYCIN ) 0.1 % Apply 1 Application topically daily. Apply to wound daily 30 g 0   Insulin  NPH, Human,, Isophane, (HUMULIN  N KWIKPEN) 100 UNIT/ML Kiwkpen Inject 33 Units into the skin in the morning and at bedtime. (Patient taking differently: Inject 33 Units into the skin in the morning and at bedtime. Taking Novolin ) 60 mL 1   Insulin  Pen Needle (DROPLET PEN NEEDLES) 31G X 8 MM MISC 1 each by Other route 2 (two) times daily with a meal. 200 each 0   levothyroxine  (SYNTHROID ) 50 MCG tablet Take 1 tablet (50 mcg total) by mouth daily. 90  tablet 0   metFORMIN  (GLUCOPHAGE ) 1000 MG tablet TAKE 1 TABLET BY MOUTH TWICE DAILY WITH MEALS 180 tablet 0   nitroGLYCERIN  (NITROSTAT ) 0.4 MG SL tablet Place 1 tablet (0.4 mg total) under the tongue every 5 (five) minutes as needed for chest pain. 25 tablet 3   potassium chloride  SA (KLOR-CON  M) 20 MEQ tablet Take 1 tablet (20 mEq total) by mouth daily. 30 tablet 0   spironolactone  (ALDACTONE ) 25 MG tablet Take 1 tablet (25 mg total) by mouth daily. 30 tablet 0   torsemide  (DEMADEX ) 20 MG tablet Take 2 tablets (40 mg total) by mouth 2 (two) times daily. 60 tablet 5   vitamin B-12 (CYANOCOBALAMIN ) 500 MCG tablet Take 500 mcg by mouth daily.      No current facility-administered medications for this visit.   No Known Allergies  Social History   Socioeconomic History   Marital status: Married    Spouse name: Not on file   Number of children: Not on file   Years of education: Not on file   Highest education level: Not on file  Occupational History   Not on file  Tobacco Use   Smoking status: Former    Current packs/day: 0.00    Types: Cigarettes    Quit date: 03/30/1980    Years since quitting: 44.2    Passive exposure: Never   Smokeless tobacco: Current    Types: Chew, Snuff  Vaping Use   Vaping status: Never Used  Substance and Sexual Activity   Alcohol use: No   Drug use: No   Sexual activity: Not Currently  Other Topics Concern   Not on file  Social History Narrative   Not on file   Social Drivers of Health   Tobacco Use: High Risk (06/15/2024)   Patient History    Smoking Tobacco Use: Former    Smokeless Tobacco Use: Current    Passive Exposure: Never  Physicist, Medical Strain: Low Risk (08/06/2023)   Overall Financial Resource Strain (CARDIA)    Difficulty of Paying Living Expenses: Not hard at all  Food Insecurity: No Food Insecurity (06/02/2024)   Epic    Worried About Radiation Protection Practitioner of Food in the Last Year: Never true    Ran Out of Food in the Last Year: Never true  Transportation Needs: No Transportation Needs (06/02/2024)   Epic    Lack of Transportation (Medical): No    Lack of Transportation (Non-Medical): No  Physical Activity: Sufficiently Active (08/06/2023)   Exercise Vital Sign    Days of Exercise per Week: 7 days    Minutes of Exercise per Session: 30 min  Stress: No Stress Concern Present (08/06/2023)   [employer] of Occupational Health - Occupational Stress Questionnaire    Feeling of Stress : Not at all  Social Connections: Socially Isolated (05/30/2024)   Social Connection and Isolation Panel    Frequency of Communication with Friends and Family: More than three times a week     Frequency of Social Gatherings with Friends and Family: More than three times a week    Attends Religious Services: Never    Database Administrator or Organizations: No    Attends Banker Meetings: Never    Marital Status: Widowed  Intimate Partner Violence: Not At Risk (06/02/2024)   Epic    Fear of Current or Ex-Partner: No    Emotionally Abused: No    Physically Abused: No    Sexually Abused: No  Depression (PHQ2-9): Low  Risk (06/02/2024)   Depression (PHQ2-9)    PHQ-2 Score: 0  Alcohol Screen: Low Risk (08/06/2023)   Alcohol Screen    Last Alcohol Screening Score (AUDIT): 0  Housing: Unknown (06/02/2024)   Epic    Unable to Pay for Housing in the Last Year: No    Number of Times Moved in the Last Year: Not on file    Homeless in the Last Year: No  Utilities: Not At Risk (06/02/2024)   Epic    Threatened with loss of utilities: No  Health Literacy: Adequate Health Literacy (08/06/2023)   B1300 Health Literacy    Frequency of need for help with medical instructions: Never    Family History  Problem Relation Age of Onset   Heart failure Mother    Cancer Father    Diabetes Sister    Cancer Sister    Wt Readings from Last 3 Encounters:  06/16/24 91.2 kg (201 lb)  06/14/24 93 kg (205 lb)  06/09/24 93 kg (205 lb)   There were no vitals taken for this visit.  PHYSICAL EXAM: General:  chronically ill appearing.  +2L [place] Neck: JVD ~10 cm.  Cor: Regular rate & rhythm. No murmurs. Lungs: clear, diminished bases Extremities: +2 BLE edema. Boot to RLE Neuro: alert & oriented x 3. Affect pleasant.   ReDs reading: 44 %, abnormal   St. Scientist, Product/process Development (personally reviewed): AF/AT burden <1%, VP 97%, no CorVue   ASSESSMENT & PLAN: End-Stage Chronic HFpEF / PAH / Cor Pulmonale - Evidence of RV dysfunction on echo dating back to 2019.  - L/RHC in 10/19: Nonobstructive CAD involving RCA, PCW 8, PA 46/16 (26), Fick CO/CI 4.45/2.07, TD CO/CI 3.64/1.69.  - PFTs in  2020 with mixed restrictive and obstructive defect. CT w/o evidence of ILD. - Echo 7/25: EF 60-65%, interventricular septum is flattened, RV enlarged and HK, RVSP 58 mmHg, mod TR - RHC 7/25: RA 16, PA mean 34, PCWP 11, TD CO/CI 3.8/1.8, unable to read Fick, PVR 6.0, PAPi 1.4 - Given severe CO2 retention suspect most WHO Group 3 disease but may have component of WHO Group 1.  - Reports adherence with home BiPAP; On home O2 - NYHA IIIa-b. Volume overloaded on exam and by ReDS.  - Increase Torsemide  to 40 mg BID. Will not adjust KDUR today as he was previously off spiro 2/2 hyperkalemia but is back on it. Labs today, repeat at f/u next week.  - Continue Jardiance  10 mg daily. May need to stop with LE wounds, reports that this is healing well.  - Continue spiro 25 mg daily.  - Off lisinopril  and chlorthalidone  with hypotension - Will arrange for UNNA boots to be placed at home.   2. CKD IIIb - Baseline SCr 1.3-1.4. - Continue SGLT2i for now  3. PAD - chronic osteomyelitis to R foot - Follows with podiatry and VVS - Continue statin + ASA - Followed by wound clinic for RLE foot ulcer   4. CHB  - s/p dual chamber PPM w/ LBBAP lead  5. Paroxysmal Atrial Flutter - Device interrogation as above - Off amiodarone  with pulmonary disease   6. 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 Failure to Thrive - Per patient reports OP palliative care has not followed up with him yet.  - DNR - Will arrange for paramedicine to help with medications. Struggles now with new insurance. Previously his insurance  provided set up boxes for him but that is no longer an option.    Follow up next week with APP to reevaluate volume status.   Caffie Shed, PA-C 06/17/24  "

## 2024-06-20 ENCOUNTER — Ambulatory Visit (HOSPITAL_COMMUNITY)

## 2024-06-22 ENCOUNTER — Telehealth (HOSPITAL_COMMUNITY): Payer: Self-pay

## 2024-06-22 NOTE — Telephone Encounter (Signed)
 Left secure message for [facility], RN with [facility]  [phone])      Called and left message with Dana to follow up about Unna boots as Mr. Trimm reports no one has come out to perform wound care on his foot or change out his unna boots.   Powell Mirza, EMT-Paramedic [phone] 06/22/2024

## 2024-06-24 ENCOUNTER — Telehealth (HOSPITAL_COMMUNITY): Payer: Self-pay

## 2024-06-24 NOTE — Progress Notes (Signed)
 "  Advanced Heart Failure Clinic Note   PCP: Jaycee Greig PARAS, NP Cardiologist: Lurena MARLA Red, MD  HF Cardiologist: Dr. Cherrie  HPI: 79 y.o. male with history of CAD, chronic HFpEF with RV failure, DM, HTN, HLD, OSA and chronic respiratory failure on BiPAP, CHB s/p dual chamber PPM with LBBAP lead in 01/25, hx asbestos exposure with pleural plaque on prior HRCT but no ILD, remote tobacco use.    Appears to be evidence of RV dysfunction on echo dating back to 2019. L/RHC in 02/2018: Nonobstructive CAD involving RCA, PCWP mean 8, PA 46/16 (26), Fick CO/CI 4.45/2.07, TD CO/CI 3.64/1.69.    Admitted for acute decompensated HFpEF and RV failure 12/07/23. Echo showed EF 60-65%, interventricular septum is flattened, RV enlarged and HK, RVSP 58 mmHg, moderate TR. RHC showed RA 16, mPA 34, PCWP 11, TD CO/CI 3.8/1.8, PVR 6.0. Required ICU stay for BiPAP and inotrope/pressor support.   Seen by PCP 12/25/23. Noted to by hypotensive 74/38, asymptomatic. BUN >100. Referred to nutrition, ENT for dysphagia, and nephrology.   Had debridement on 04/07/24. Did not want any further surgeries. Saw [facility] and was offered lower extremity angiogram, however patient declined invasive treatments. Followed by palliative care.   Admitted 1/26 with A/C HFpEF. Diuresed well with IV lasix . Code status changed to DNR. Discharged with palliative care referral for OP f/u.   Today he returns for HF follow up. Overall feeling fine. Wears 2 L oxygen  at home. Able to walk short distances around the house, quickly gets SOB with this. Feet and legs are swelling some, wearing Unna boots. Balance is poor, no falls or syncope. Denies palpitations, abnormal bleeding, CP, dizziness, or PND/Orthopnea. Appetite ok. Weight at home 200 pounds. Taking all medications. He lives alone, son lives in his garage. Uses chewing tobacco.  Past Medical History:  Diagnosis Date   Diabetes mellitus    High cholesterol    Hypertension    Peripheral  arterial disease    Current Outpatient Medications  Medication Sig Dispense Refill   albuterol  (VENTOLIN  HFA) 108 (90 Base) MCG/ACT inhaler INHALE 2 PUFFS BY MOUTH EVERY 6 HOURS AS NEEDED FOR WHEEZING FOR SHORTNESS OF BREATH 9 g 5   aspirin  EC 81 MG tablet Take 81 mg by mouth daily. Swallow whole.     atorvastatin  (LIPITOR) 40 MG tablet Take 40 mg by mouth daily.     blood glucose meter kit and supplies Dispense based on patient and insurance preference. Use up to four times daily as directed. (FOR ICD-10 E10.9, E11.9). 1 each 0   Cholecalciferol  (VITAMIN D -3) 25 MCG (1000 UT) CAPS Take 1 capsule by mouth every morning.     empagliflozin  (JARDIANCE ) 10 MG TABS tablet Take 1 tablet (10 mg total) by mouth daily before breakfast. 90 tablet 3   ezetimibe  (ZETIA ) 10 MG tablet Take 1 tablet (10 mg total) by mouth daily. (Patient taking differently: Take 10 mg by mouth daily.) 90 tablet 3   gentamicin  ointment (GARAMYCIN ) 0.1 % Apply 1 Application topically daily. Apply to wound daily 30 g 0   Insulin  NPH, Human,, Isophane, (HUMULIN  N KWIKPEN) 100 UNIT/ML Kiwkpen Inject 33 Units into the skin in the morning and at bedtime. (Patient taking differently: Inject 33 Units into the skin in the morning and at bedtime. Taking Novolin ) 60 mL 1   Insulin  Pen Needle (DROPLET PEN NEEDLES) 31G X 8 MM MISC 1 each by Other route 2 (two) times daily with a meal. 200 each 0  levothyroxine  (SYNTHROID ) 50 MCG tablet Take 1 tablet (50 mcg total) by mouth daily. 90 tablet 0   metFORMIN  (GLUCOPHAGE ) 1000 MG tablet TAKE 1 TABLET BY MOUTH TWICE DAILY WITH MEALS 180 tablet 0   nitroGLYCERIN  (NITROSTAT ) 0.4 MG SL tablet Place 1 tablet (0.4 mg total) under the tongue every 5 (five) minutes as needed for chest pain. 25 tablet 3   potassium chloride  SA (KLOR-CON  M) 20 MEQ tablet Take 1 tablet (20 mEq total) by mouth daily. 30 tablet 0   spironolactone  (ALDACTONE ) 25 MG tablet Take 1 tablet (25 mg total) by mouth daily. 30 tablet 0    torsemide  (DEMADEX ) 20 MG tablet Take 2 tablets (40 mg total) by mouth 2 (two) times daily. 60 tablet 5   vitamin B-12 (CYANOCOBALAMIN ) 500 MCG tablet Take 500 mcg by mouth daily.     No current facility-administered medications for this encounter.   No Known Allergies  Social History   Socioeconomic History   Marital status: Married    Spouse name: Not on file   Number of children: Not on file   Years of education: Not on file   Highest education level: Not on file  Occupational History   Not on file  Tobacco Use   Smoking status: Former    Current packs/day: 0.00    Types: Cigarettes    Quit date: 03/30/1980    Years since quitting: 44.2    Passive exposure: Never   Smokeless tobacco: Current    Types: Chew, Snuff  Vaping Use   Vaping status: Never Used  Substance and Sexual Activity   Alcohol use: No   Drug use: No   Sexual activity: Not Currently  Other Topics Concern   Not on file  Social History Narrative   Not on file   Social Drivers of Health   Tobacco Use: High Risk (06/27/2024)   Patient History    Smoking Tobacco Use: Former    Smokeless Tobacco Use: Current    Passive Exposure: Never  Physicist, Medical Strain: Low Risk (08/06/2023)   Overall Financial Resource Strain (CARDIA)    Difficulty of Paying Living Expenses: Not hard at all  Food Insecurity: No Food Insecurity (06/02/2024)   Epic    Worried About Radiation Protection Practitioner of Food in the Last Year: Never true    Ran Out of Food in the Last Year: Never true  Transportation Needs: No Transportation Needs (06/02/2024)   Epic    Lack of Transportation (Medical): No    Lack of Transportation (Non-Medical): No  Physical Activity: Sufficiently Active (08/06/2023)   Exercise Vital Sign    Days of Exercise per Week: 7 days    Minutes of Exercise per Session: 30 min  Stress: No Stress Concern Present (08/06/2023)   [employer] of Occupational Health - Occupational Stress Questionnaire    Feeling of Stress :  Not at all  Social Connections: Socially Isolated (05/30/2024)   Social Connection and Isolation Panel    Frequency of Communication with Friends and Family: More than three times a week    Frequency of Social Gatherings with Friends and Family: More than three times a week    Attends Religious Services: Never    Database Administrator or Organizations: No    Attends Banker Meetings: Never    Marital Status: Widowed  Intimate Partner Violence: Not At Risk (06/02/2024)   Epic    Fear of Current or Ex-Partner: No    Emotionally Abused: No  Physically Abused: No    Sexually Abused: No  Depression (PHQ2-9): Low Risk (06/02/2024)   Depression (PHQ2-9)    PHQ-2 Score: 0  Alcohol Screen: Low Risk (08/06/2023)   Alcohol Screen    Last Alcohol Screening Score (AUDIT): 0  Housing: Unknown (06/02/2024)   Epic    Unable to Pay for Housing in the Last Year: No    Number of Times Moved in the Last Year: Not on file    Homeless in the Last Year: No  Utilities: Not At Risk (06/02/2024)   Epic    Threatened with loss of utilities: No  Health Literacy: Adequate Health Literacy (08/06/2023)   B1300 Health Literacy    Frequency of need for help with medical instructions: Never    Family History  Problem Relation Age of Onset   Heart failure Mother    Cancer Father    Diabetes Sister    Cancer Sister    Wt Readings from Last 3 Encounters:  06/27/24 91.6 kg (202 lb)  06/16/24 91.2 kg (201 lb)  06/14/24 93 kg (205 lb)   BP 124/62   Pulse 64   Wt 91.6 kg (202 lb)   SpO2 90%   BMI 26.65 kg/m   PHYSICAL EXAM: General:  NAD. No resp difficulty, chronically-ill appearing, on oxygen  HEENT: Normal Neck: Supple. Thick neck, JVP 10  Cor: Regular rate & rhythm. No rubs, gallops or murmurs. Lungs: Diminished all lobes Abdomen: Soft, nontender, nondistended.  Extremities: No cyanosis, clubbing, rash, edema; 1+ BLE with unna boots Neuro: Alert & oriented x 3, moves all 4 extremities w/o  difficulty. Affect pleasant.  ReDs reading: 42%, abnormal   St. Scientist, Product/process Development (personally reviewed): AF/AT burden <1%, VP 97%, AP 1.3%, no CorVue   ASSESSMENT & PLAN: End-Stage Chronic HFpEF / PAH / Cor Pulmonale - Evidence of RV dysfunction on echo dating back to 2019.  - L/RHC in 10/19: Nonobstructive CAD involving RCA, PCW 8, PA 46/16 (26), Fick CO/CI 4.45/2.07, TD CO/CI 3.64/1.69.  - PFTs in 2020 with mixed restrictive and obstructive defect. CT w/o evidence of ILD. - Echo 7/25: EF 60-65%, interventricular septum is flattened, RV enlarged and HK, RVSP 58 mmHg, mod TR - RHC 7/25: RA 16, PA mean 34, PCWP 11, TD CO/CI 3.8/1.8, unable to read Fick, PVR 6.0, PAPi 1.4 - Given severe CO2 retention suspect most WHO Group 3 disease but may have component of WHO Group 1.  - Reports adherence with home BiPAP; On home O2 - NYHA III. Remains volume overloaded, ReDs 42%.  - Increase torsemide  to 80/40 - Increase KCL to 40 bid. - Continue Jardiance  10 mg daily. May need to stop with LE wounds, reports that this is healing well.  - Continue spironolactone  25 mg daily.  - Off lisinopril  and chlorthalidone  with hypotension - Continue UNNA boots - Labs today, repeat BMET at close follow up  2. CKD IIIb - Baseline SCr 1.3-1.4. - Continue SGLT2i for now - Labs today.  3. PAD - chronic osteomyelitis to R foot - Follows with podiatry and VVS - Continue statin + ASA - Followed by wound clinic for RLE foot ulcer   4. CHB  - s/p dual chamber PPM w/ LBBAP lead - Device interrogation as above  5. Paroxysmal Atrial Flutter - Device interrogation as above  - Off amiodarone  with pulmonary disease   6. 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 Failure to Thrive - Has palliative referral for outpatient care - DNR - Followed by paramedicine.   Follow up in 2 weeks with APP.   Harlene HERO [place],  FNP 06/27/24  "

## 2024-06-24 NOTE — Telephone Encounter (Signed)
 Called to confirm/remind patient of their appointment at the Advanced [facility] Failure Clinic on 06/27/24.   Appointment:   [x] Confirmed  [] Left mess   [] No answer/No voice mail  [] VM Full/unable to leave message  [] Phone not in service  Patient reminded to bring all medications and/or complete list.  Confirmed patient has transportation. Gave directions, instructed to utilize valet parking.

## 2024-06-24 NOTE — Telephone Encounter (Signed)
 Robert Ramirez returned my call and reports she will look into his referral and have someone report out to Robert Ramirez this week.   Powell Mirza, EMT-Paramedic [phone] 06/24/2024

## 2024-06-26 ENCOUNTER — Telehealth (HOSPITAL_COMMUNITY): Payer: Self-pay

## 2024-06-27 ENCOUNTER — Ambulatory Visit (HOSPITAL_COMMUNITY): Payer: Self-pay | Admitting: Family Medicine

## 2024-06-27 ENCOUNTER — Ambulatory Visit (HOSPITAL_COMMUNITY): Admission: RE | Admit: 2024-06-27 | Discharge: 2024-06-27 | Attending: Family Medicine

## 2024-06-27 ENCOUNTER — Ambulatory Visit (HOSPITAL_COMMUNITY)

## 2024-06-27 ENCOUNTER — Encounter (HOSPITAL_COMMUNITY): Payer: Self-pay

## 2024-06-27 VITALS — BP 124/62 | HR 64 | Wt 202.0 lb

## 2024-06-27 DIAGNOSIS — Z7984 Long term (current) use of oral hypoglycemic drugs: Secondary | ICD-10-CM | POA: Insufficient documentation

## 2024-06-27 DIAGNOSIS — Z9981 Dependence on supplemental oxygen: Secondary | ICD-10-CM | POA: Insufficient documentation

## 2024-06-27 DIAGNOSIS — E7800 Pure hypercholesterolemia, unspecified: Secondary | ICD-10-CM | POA: Insufficient documentation

## 2024-06-27 DIAGNOSIS — N1832 Chronic kidney disease, stage 3b: Secondary | ICD-10-CM | POA: Diagnosis not present

## 2024-06-27 DIAGNOSIS — Z45018 Encounter for adjustment and management of other part of cardiac pacemaker: Secondary | ICD-10-CM | POA: Insufficient documentation

## 2024-06-27 DIAGNOSIS — E1151 Type 2 diabetes mellitus with diabetic peripheral angiopathy without gangrene: Secondary | ICD-10-CM | POA: Insufficient documentation

## 2024-06-27 DIAGNOSIS — J9610 Chronic respiratory failure, unspecified whether with hypoxia or hypercapnia: Secondary | ICD-10-CM | POA: Insufficient documentation

## 2024-06-27 DIAGNOSIS — R627 Adult failure to thrive: Secondary | ICD-10-CM | POA: Insufficient documentation

## 2024-06-27 DIAGNOSIS — I4892 Unspecified atrial flutter: Secondary | ICD-10-CM | POA: Insufficient documentation

## 2024-06-27 DIAGNOSIS — E1122 Type 2 diabetes mellitus with diabetic chronic kidney disease: Secondary | ICD-10-CM | POA: Insufficient documentation

## 2024-06-27 DIAGNOSIS — E1169 Type 2 diabetes mellitus with other specified complication: Secondary | ICD-10-CM | POA: Insufficient documentation

## 2024-06-27 DIAGNOSIS — F17220 Nicotine dependence, chewing tobacco, uncomplicated: Secondary | ICD-10-CM | POA: Insufficient documentation

## 2024-06-27 DIAGNOSIS — I442 Atrioventricular block, complete: Secondary | ICD-10-CM | POA: Diagnosis not present

## 2024-06-27 DIAGNOSIS — J984 Other disorders of lung: Secondary | ICD-10-CM | POA: Insufficient documentation

## 2024-06-27 DIAGNOSIS — I50812 Chronic right heart failure: Secondary | ICD-10-CM | POA: Insufficient documentation

## 2024-06-27 DIAGNOSIS — L989 Disorder of the skin and subcutaneous tissue, unspecified: Secondary | ICD-10-CM | POA: Insufficient documentation

## 2024-06-27 DIAGNOSIS — Z7982 Long term (current) use of aspirin: Secondary | ICD-10-CM | POA: Insufficient documentation

## 2024-06-27 DIAGNOSIS — Z794 Long term (current) use of insulin: Secondary | ICD-10-CM | POA: Insufficient documentation

## 2024-06-27 DIAGNOSIS — I2721 Secondary pulmonary arterial hypertension: Secondary | ICD-10-CM | POA: Insufficient documentation

## 2024-06-27 DIAGNOSIS — I739 Peripheral vascular disease, unspecified: Secondary | ICD-10-CM

## 2024-06-27 DIAGNOSIS — I130 Hypertensive heart and chronic kidney disease with heart failure and stage 1 through stage 4 chronic kidney disease, or unspecified chronic kidney disease: Secondary | ICD-10-CM | POA: Insufficient documentation

## 2024-06-27 DIAGNOSIS — I5032 Chronic diastolic (congestive) heart failure: Secondary | ICD-10-CM | POA: Diagnosis not present

## 2024-06-27 DIAGNOSIS — Z79899 Other long term (current) drug therapy: Secondary | ICD-10-CM | POA: Insufficient documentation

## 2024-06-27 DIAGNOSIS — E11621 Type 2 diabetes mellitus with foot ulcer: Secondary | ICD-10-CM | POA: Insufficient documentation

## 2024-06-27 DIAGNOSIS — Z66 Do not resuscitate: Secondary | ICD-10-CM | POA: Insufficient documentation

## 2024-06-27 DIAGNOSIS — L97519 Non-pressure chronic ulcer of other part of right foot with unspecified severity: Secondary | ICD-10-CM | POA: Insufficient documentation

## 2024-06-27 DIAGNOSIS — Z77090 Contact with and (suspected) exposure to asbestos: Secondary | ICD-10-CM | POA: Insufficient documentation

## 2024-06-27 DIAGNOSIS — I959 Hypotension, unspecified: Secondary | ICD-10-CM | POA: Insufficient documentation

## 2024-06-27 DIAGNOSIS — I2510 Atherosclerotic heart disease of native coronary artery without angina pectoris: Secondary | ICD-10-CM | POA: Insufficient documentation

## 2024-06-27 DIAGNOSIS — I2781 Cor pulmonale (chronic): Secondary | ICD-10-CM | POA: Insufficient documentation

## 2024-06-27 DIAGNOSIS — M86671 Other chronic osteomyelitis, right ankle and foot: Secondary | ICD-10-CM | POA: Insufficient documentation

## 2024-06-27 DIAGNOSIS — I5084 End stage heart failure: Secondary | ICD-10-CM | POA: Diagnosis not present

## 2024-06-27 DIAGNOSIS — E8729 Other acidosis: Secondary | ICD-10-CM | POA: Insufficient documentation

## 2024-06-27 DIAGNOSIS — G4733 Obstructive sleep apnea (adult) (pediatric): Secondary | ICD-10-CM | POA: Insufficient documentation

## 2024-06-27 LAB — BASIC METABOLIC PANEL WITH GFR
Anion gap: 7 (ref 5–15)
BUN: 36 mg/dL — ABNORMAL HIGH (ref 8–23)
CO2: 44 mmol/L — ABNORMAL HIGH (ref 22–32)
Calcium: 9.9 mg/dL (ref 8.9–10.3)
Chloride: 93 mmol/L — ABNORMAL LOW (ref 98–111)
Creatinine, Ser: 1.39 mg/dL — ABNORMAL HIGH (ref 0.61–1.24)
GFR, Estimated: 52 mL/min — ABNORMAL LOW
Glucose, Bld: 299 mg/dL — ABNORMAL HIGH (ref 70–99)
Potassium: 4.7 mmol/L (ref 3.5–5.1)
Sodium: 143 mmol/L (ref 135–145)

## 2024-06-27 LAB — PRO BRAIN NATRIURETIC PEPTIDE: Pro Brain Natriuretic Peptide: 2259 pg/mL — ABNORMAL HIGH

## 2024-06-27 MED ORDER — POTASSIUM CHLORIDE CRYS ER 20 MEQ PO TBCR: 40.0000 meq | EXTENDED_RELEASE_TABLET | Freq: Two times a day (BID) | ORAL | 2 refills | Status: AC

## 2024-06-27 MED ORDER — TORSEMIDE 20 MG PO TABS: ORAL_TABLET | ORAL | 5 refills | Status: AC

## 2024-06-27 NOTE — Progress Notes (Signed)
"   ReDS Vest / Clip - 06/27/24 1500       ReDS Vest / Clip   Station Marker D    Ruler Value 35.5    ReDS Value Range High volume overload    ReDS Actual Value 42          "

## 2024-06-30 ENCOUNTER — Other Ambulatory Visit (HOSPITAL_COMMUNITY): Payer: Self-pay

## 2024-06-30 NOTE — Progress Notes (Unsigned)
 Paramedicine Encounter    Patient ID: Robert Ramirez, male    DOB: 03/16/1946, 79 y.o.   MRN: [id number]   Complaints- chronic shortness of breath, lower legs still have not been re-wrapped (same wraps for 3 weeks)   Assessment- CAOx4 , warm and dry seated in his recliner on his oxygen , lungs slightly diminished but clear, vitals within normal limits.   Compliance with meds- no missed doses   Pill box filled- for one week ( he tried to fill his own pill box and it wasn't accurate)   Refills needed- none   Meds changes since last visit- torsemide  increase to 80mg  morning, 40mg  eve and 40mg  BID     Social changes- I called [facility] again today and spoke to [facility] explaining that he has not had anyone come out to wrap his legs for several weeks now or tend to his wound care and she called their office and someone named Dorthea called me and said someone would be out today or tomorrow.   VISIT SUMMARY- Arrived for home visit for Robert Ramirez who reports to be feeling okay with no complaints other than chronic shortness of breath and that his lower legs have not been re-wrapped for over 3 weeks. I called [facility] and they report they will be sending someone out within 24 hours. I obtained vitals and assessment as noted. I reviewed HF education with him as he is still drinking Gatorade Zeros despite our discussion. We reviewed meds and I filled his pill box for one week. He attempted to fill it but it wasn't accurate. He agreed to allow me to only fill moving forward for now. We reviewed upcoming appointments and I wrote down same. Home visit complete. I will see him in one week.   BP 102/64   Pulse 86   Resp 18   Wt 200 lb (90.7 kg)   SpO2 93%   BMI 26.39 kg/m  Weight yesterday-- didn't weigh  Last visit weight-- 202     ACTION: Home visit completed     Patient Care Team: Jaycee Greig PARAS, NP as PCP - General (Nurse Practitioner) Wendel Lurena POUR, MD as PCP - Cardiology  (Cardiology) Kennyth Chew, MD as PCP - Electrophysiology (Cardiology)  Patient Active Problem List   Diagnosis Date Noted   Acute on chronic diastolic heart failure (HCC) 05/27/2024   PAD (peripheral artery disease) 02/16/2024   Protein-calorie malnutrition, severe 12/14/2023   (HFpEF) heart failure with preserved ejection fraction (HCC) 12/07/2023   Elevated troponin 12/07/2023   Uncontrolled type 2 diabetes mellitus with hyperglycemia, with long-term current use of insulin  (HCC) 12/07/2023   Acute kidney injury superimposed on chronic kidney disease 12/07/2023   History of permanent cardiac pacemaker placement 12/07/2023   OSA treated with BiPAP 12/07/2023   AV block 06/12/2023   Complete heart block (HCC) 05/28/2023   Conductive hearing loss 04/05/2019   Chewing tobacco use 04/05/2019   Bilateral impacted cerumen 04/05/2019   Primary hyperparathyroidism 11/21/2018   Chronic respiratory failure with hypoxia and hypercapnia (HCC) 11/18/2018   Chronic obstructive asthma (with obstructive pulmonary disease) (HCC) 06/09/2018   Other secondary pulmonary hypertension (HCC) 06/09/2018   Serum total bilirubin elevated 03/26/2018   Asbestos exposure 03/26/2018   Lesion of skin of scalp 03/26/2018   Coronary artery disease involving native coronary artery of native heart without angina pectoris 03/26/2018   Acquired hypothyroidism 03/26/2018   Diabetes (HCC) 03/14/2018   Hyponatremia    Hypertension associated with diabetes (HCC) 07/18/2014  Mixed hyperlipidemia 07/18/2014   Diabetic retinopathy associated with type 2 diabetes mellitus (HCC) 07/18/2014   Current Medications[1] Allergies[2]   Social History   Socioeconomic History   Marital status: Married    Spouse name: Not on file   Number of children: Not on file   Years of education: Not on file   Highest education level: Not on file  Occupational History   Not on file  Tobacco Use   Smoking status: Former    Current  packs/day: 0.00    Types: Cigarettes    Quit date: 03/30/1980    Years since quitting: 44.2    Passive exposure: Never   Smokeless tobacco: Current    Types: Chew, Snuff  Vaping Use   Vaping status: Never Used  Substance and Sexual Activity   Alcohol use: No   Drug use: No   Sexual activity: Not Currently  Other Topics Concern   Not on file  Social History Narrative   Not on file   Social Drivers of Health   Tobacco Use: High Risk (06/27/2024)   Patient History    Smoking Tobacco Use: Former    Smokeless Tobacco Use: Current    Passive Exposure: Never  Physicist, Medical Strain: Low Risk (08/06/2023)   Overall Financial Resource Strain (CARDIA)    Difficulty of Paying Living Expenses: Not hard at all  Food Insecurity: No Food Insecurity (06/02/2024)   Epic    Worried About Radiation Protection Practitioner of Food in the Last Year: Never true    Ran Out of Food in the Last Year: Never true  Transportation Needs: No Transportation Needs (06/02/2024)   Epic    Lack of Transportation (Medical): No    Lack of Transportation (Non-Medical): No  Physical Activity: Sufficiently Active (08/06/2023)   Exercise Vital Sign    Days of Exercise per Week: 7 days    Minutes of Exercise per Session: 30 min  Stress: No Stress Concern Present (08/06/2023)   [employer] of Occupational Health - Occupational Stress Questionnaire    Feeling of Stress : Not at all  Social Connections: Socially Isolated (05/30/2024)   Social Connection and Isolation Panel    Frequency of Communication with Friends and Family: More than three times a week    Frequency of Social Gatherings with Friends and Family: More than three times a week    Attends Religious Services: Never    Database Administrator or Organizations: No    Attends Banker Meetings: Never    Marital Status: Widowed  Intimate Partner Violence: Not At Risk (06/02/2024)   Epic    Fear of Current or Ex-Partner: No    Emotionally Abused: No    Physically  Abused: No    Sexually Abused: No  Depression (PHQ2-9): Low Risk (06/02/2024)   Depression (PHQ2-9)    PHQ-2 Score: 0  Alcohol Screen: Low Risk (08/06/2023)   Alcohol Screen    Last Alcohol Screening Score (AUDIT): 0  Housing: Unknown (06/02/2024)   Epic    Unable to Pay for Housing in the Last Year: No    Number of Times Moved in the Last Year: Not on file    Homeless in the Last Year: No  Utilities: Not At Risk (06/02/2024)   Epic    Threatened with loss of utilities: No  Health Literacy: Adequate Health Literacy (08/06/2023)   B1300 Health Literacy    Frequency of need for help with medical instructions: Never    Physical Exam  Future Appointments  Date Time Provider Department Center  07/04/2024 10:40 AM Jaycee Greig PARAS, NP [facility] [address]  07/05/2024  1:15 PM Rosan Harlene Fickle, DO [facility]  07/11/2024  2:30 PM [facility] PA/NP [facility] None  07/19/2024  1:30 PM [facility]  08/18/2024  3:00 PM PCE-ANNUAL WELLNESS VISIT [facility] [address]  09/05/2024  7:00 AM [facility] HVT DEVICE REMOTES [facility]  12/05/2024  7:00 AM [facility] HVT DEVICE REMOTES [facility]  03/06/2025  7:00 AM [facility] HVT DEVICE REMOTES [facility]                 [1]  Current Outpatient Medications:    albuterol  (VENTOLIN  HFA) 108 (90 Base) MCG/ACT inhaler, INHALE 2 PUFFS BY MOUTH EVERY 6 HOURS AS NEEDED FOR WHEEZING FOR SHORTNESS OF BREATH, Disp: 9 g, Rfl: 5   aspirin  EC 81 MG tablet, Take 81 mg by mouth daily. Swallow whole., Disp: , Rfl:    atorvastatin  (LIPITOR) 40 MG tablet, Take 40 mg by mouth daily., Disp: , Rfl:    blood glucose meter kit and supplies, Dispense based on patient and insurance preference. Use up to four times daily as directed. (FOR ICD-10 E10.9, E11.9)., Disp: 1 each, Rfl: 0   Cholecalciferol  (VITAMIN D -3) 25 MCG (1000 UT) CAPS, Take 1 capsule by mouth every morning., Disp: , Rfl:    empagliflozin  (JARDIANCE ) 10 MG TABS tablet, Take 1 tablet (10 mg total) by  mouth daily before breakfast., Disp: 90 tablet, Rfl: 3   ezetimibe  (ZETIA ) 10 MG tablet, Take 1 tablet (10 mg total) by mouth daily. (Patient taking differently: Take 10 mg by mouth daily. Prefers to take at night), Disp: 90 tablet, Rfl: 3   gentamicin  ointment (GARAMYCIN ) 0.1 %, Apply 1 Application topically daily. Apply to wound daily, Disp: 30 g, Rfl: 0   Insulin  Pen Needle (DROPLET PEN NEEDLES) 31G X 8 MM MISC, 1 each by Other route 2 (two) times daily with a meal., Disp: 200 each, Rfl: 0   levothyroxine  (SYNTHROID ) 50 MCG tablet, Take 1 tablet (50 mcg total) by mouth daily., Disp: 90 tablet, Rfl: 0   metFORMIN  (GLUCOPHAGE ) 1000 MG tablet, TAKE 1 TABLET BY MOUTH TWICE DAILY WITH MEALS, Disp: 180 tablet, Rfl: 0   nitroGLYCERIN  (NITROSTAT ) 0.4 MG SL tablet, Place 1 tablet (0.4 mg total) under the tongue every 5 (five) minutes as needed for chest pain., Disp: 25 tablet, Rfl: 3   potassium chloride  SA (KLOR-CON  M) 20 MEQ tablet, Take 2 tablets (40 mEq total) by mouth 2 (two) times daily., Disp: 90 tablet, Rfl: 2   spironolactone  (ALDACTONE ) 25 MG tablet, Take 1 tablet (25 mg total) by mouth daily., Disp: 30 tablet, Rfl: 0   torsemide  (DEMADEX ) 20 MG tablet, Take 4 tablets (80 mg total) by mouth in the morning AND 2 tablets (40 mg total) every evening., Disp: 120 tablet, Rfl: 5   vitamin B-12 (CYANOCOBALAMIN ) 500 MCG tablet, Take 500 mcg by mouth daily., Disp: , Rfl:    Insulin  NPH, Human,, Isophane, (HUMULIN  N KWIKPEN) 100 UNIT/ML Kiwkpen, Inject 33 Units into the skin in the morning and at bedtime. (Patient taking differently: Inject 33 Units into the skin in the morning and at bedtime. Taking Novolin ), Disp: 60 mL, Rfl: 1 [2] No Known Allergies

## 2024-07-04 ENCOUNTER — Encounter: Payer: Medicare Other | Admitting: Family

## 2024-07-04 ENCOUNTER — Ambulatory Visit (HOSPITAL_COMMUNITY)

## 2024-07-05 ENCOUNTER — Encounter (HOSPITAL_BASED_OUTPATIENT_CLINIC_OR_DEPARTMENT_OTHER): Admitting: Internal Medicine

## 2024-07-11 ENCOUNTER — Ambulatory Visit (HOSPITAL_COMMUNITY)

## 2024-07-19 ENCOUNTER — Ambulatory Visit

## 2024-08-18 ENCOUNTER — Ambulatory Visit

## 2024-09-05 ENCOUNTER — Encounter

## 2024-12-05 ENCOUNTER — Encounter

## 2025-03-06 ENCOUNTER — Encounter
